# Patient Record
Sex: Male | Born: 1950 | Race: White | Hispanic: No | Marital: Single | Smoking: Current some day smoker
Health system: Southern US, Community
[De-identification: ages and names within clinical notes are randomized; demographics above are authoritative.]

## PROBLEM LIST (undated history)

## (undated) DIAGNOSIS — T4145XA Adverse effect of unspecified anesthetic, initial encounter: Secondary | ICD-10-CM

## (undated) DIAGNOSIS — N401 Enlarged prostate with lower urinary tract symptoms: Secondary | ICD-10-CM

## (undated) DIAGNOSIS — K219 Gastro-esophageal reflux disease without esophagitis: Secondary | ICD-10-CM

## (undated) DIAGNOSIS — J301 Allergic rhinitis due to pollen: Secondary | ICD-10-CM

## (undated) DIAGNOSIS — J209 Acute bronchitis, unspecified: Secondary | ICD-10-CM

## (undated) DIAGNOSIS — M79609 Pain in unspecified limb: Secondary | ICD-10-CM

## (undated) DIAGNOSIS — R634 Abnormal weight loss: Secondary | ICD-10-CM

## (undated) DIAGNOSIS — N138 Other obstructive and reflux uropathy: Secondary | ICD-10-CM

## (undated) DIAGNOSIS — I319 Disease of pericardium, unspecified: Secondary | ICD-10-CM

## (undated) DIAGNOSIS — M51379 Other intervertebral disc degeneration, lumbosacral region without mention of lumbar back pain or lower extremity pain: Secondary | ICD-10-CM

## (undated) DIAGNOSIS — E669 Obesity, unspecified: Secondary | ICD-10-CM

## (undated) DIAGNOSIS — G709 Myoneural disorder, unspecified: Secondary | ICD-10-CM

## (undated) DIAGNOSIS — L089 Local infection of the skin and subcutaneous tissue, unspecified: Secondary | ICD-10-CM

## (undated) DIAGNOSIS — D126 Benign neoplasm of colon, unspecified: Secondary | ICD-10-CM

## (undated) DIAGNOSIS — M4800 Spinal stenosis, site unspecified: Secondary | ICD-10-CM

## (undated) DIAGNOSIS — F17200 Nicotine dependence, unspecified, uncomplicated: Secondary | ICD-10-CM

## (undated) DIAGNOSIS — K5730 Diverticulosis of large intestine without perforation or abscess without bleeding: Secondary | ICD-10-CM

## (undated) DIAGNOSIS — G479 Sleep disorder, unspecified: Secondary | ICD-10-CM

## (undated) DIAGNOSIS — T8859XA Other complications of anesthesia, initial encounter: Secondary | ICD-10-CM

## (undated) DIAGNOSIS — R059 Cough, unspecified: Secondary | ICD-10-CM

## (undated) DIAGNOSIS — G2581 Restless legs syndrome: Secondary | ICD-10-CM

## (undated) DIAGNOSIS — I10 Essential (primary) hypertension: Secondary | ICD-10-CM

## (undated) DIAGNOSIS — I868 Varicose veins of other specified sites: Secondary | ICD-10-CM

## (undated) DIAGNOSIS — M5137 Other intervertebral disc degeneration, lumbosacral region: Secondary | ICD-10-CM

## (undated) DIAGNOSIS — T25229A Burn of second degree of unspecified foot, initial encounter: Secondary | ICD-10-CM

## (undated) DIAGNOSIS — R609 Edema, unspecified: Secondary | ICD-10-CM

## (undated) DIAGNOSIS — R05 Cough: Secondary | ICD-10-CM

## (undated) DIAGNOSIS — K5903 Drug induced constipation: Secondary | ICD-10-CM

## (undated) DIAGNOSIS — E119 Type 2 diabetes mellitus without complications: Secondary | ICD-10-CM

## (undated) DIAGNOSIS — R259 Unspecified abnormal involuntary movements: Secondary | ICD-10-CM

## (undated) HISTORY — DX: Edema, unspecified: R60.9

## (undated) HISTORY — DX: Restless legs syndrome: G25.81

## (undated) HISTORY — DX: Diverticulosis of large intestine without perforation or abscess without bleeding: K57.30

## (undated) HISTORY — DX: Obesity, unspecified: E66.9

## (undated) HISTORY — DX: Cough: R05

## (undated) HISTORY — DX: Pain in unspecified limb: M79.609

## (undated) HISTORY — DX: Local infection of the skin and subcutaneous tissue, unspecified: L08.9

## (undated) HISTORY — DX: Other obstructive and reflux uropathy: N13.8

## (undated) HISTORY — DX: Allergic rhinitis due to pollen: J30.1

## (undated) HISTORY — DX: Unspecified abnormal involuntary movements: R25.9

## (undated) HISTORY — PX: BACK SURGERY: SHX140

## (undated) HISTORY — DX: Sleep disorder, unspecified: G47.9

## (undated) HISTORY — DX: Acute bronchitis, unspecified: J20.9

## (undated) HISTORY — DX: Other intervertebral disc degeneration, lumbosacral region: M51.37

## (undated) HISTORY — DX: Essential (primary) hypertension: I10

## (undated) HISTORY — DX: Other intervertebral disc degeneration, lumbosacral region without mention of lumbar back pain or lower extremity pain: M51.379

## (undated) HISTORY — DX: Nicotine dependence, unspecified, uncomplicated: F17.200

## (undated) HISTORY — DX: Disease of pericardium, unspecified: I31.9

## (undated) HISTORY — DX: Abnormal weight loss: R63.4

## (undated) HISTORY — DX: Burn of second degree of unspecified foot, initial encounter: T25.229A

## (undated) HISTORY — DX: Gastro-esophageal reflux disease without esophagitis: K21.9

## (undated) HISTORY — DX: Type 2 diabetes mellitus without complications: E11.9

## (undated) HISTORY — DX: Spinal stenosis, site unspecified: M48.00

## (undated) HISTORY — PX: COLONOSCOPY W/ POLYPECTOMY: SHX1380

## (undated) HISTORY — DX: Varicose veins of other specified sites: I86.8

## (undated) HISTORY — PX: CATARACT EXTRACTION: SUR2

## (undated) HISTORY — DX: Benign neoplasm of colon, unspecified: D12.6

## (undated) HISTORY — PX: TONSILLECTOMY: SUR1361

## (undated) HISTORY — DX: Cough, unspecified: R05.9

## (undated) HISTORY — PX: SPINE SURGERY: SHX786

## (undated) HISTORY — DX: Benign prostatic hyperplasia with lower urinary tract symptoms: N40.1

---

## 1989-09-24 HISTORY — PX: SAPHENOUS VEIN GRAFT RESECTION: SHX2374

## 2005-07-31 ENCOUNTER — Encounter: Admission: RE | Admit: 2005-07-31 | Discharge: 2005-07-31 | Payer: Self-pay | Admitting: Internal Medicine

## 2005-08-19 ENCOUNTER — Emergency Department (HOSPITAL_COMMUNITY): Admission: EM | Admit: 2005-08-19 | Discharge: 2005-08-19 | Payer: Self-pay | Admitting: Emergency Medicine

## 2005-09-14 ENCOUNTER — Encounter: Admission: RE | Admit: 2005-09-14 | Discharge: 2005-09-14 | Payer: Self-pay | Admitting: Neurology

## 2005-10-05 ENCOUNTER — Inpatient Hospital Stay (HOSPITAL_COMMUNITY): Admission: RE | Admit: 2005-10-05 | Discharge: 2005-10-06 | Payer: Self-pay | Admitting: Psychiatry

## 2005-10-06 ENCOUNTER — Ambulatory Visit: Payer: Self-pay | Admitting: Psychiatry

## 2005-10-08 ENCOUNTER — Other Ambulatory Visit (HOSPITAL_COMMUNITY): Admission: RE | Admit: 2005-10-08 | Discharge: 2005-10-19 | Payer: Self-pay | Admitting: Psychiatry

## 2005-10-14 ENCOUNTER — Emergency Department (HOSPITAL_COMMUNITY): Admission: EM | Admit: 2005-10-14 | Discharge: 2005-10-14 | Payer: Self-pay | Admitting: Emergency Medicine

## 2005-10-15 ENCOUNTER — Emergency Department (HOSPITAL_COMMUNITY): Admission: EM | Admit: 2005-10-15 | Discharge: 2005-10-16 | Payer: Self-pay | Admitting: Emergency Medicine

## 2005-10-20 ENCOUNTER — Encounter: Admission: RE | Admit: 2005-10-20 | Discharge: 2005-10-20 | Payer: Self-pay | Admitting: Neurology

## 2006-09-01 ENCOUNTER — Encounter: Admission: RE | Admit: 2006-09-01 | Discharge: 2006-09-01 | Payer: Self-pay | Admitting: Neurology

## 2006-10-04 ENCOUNTER — Encounter: Admission: RE | Admit: 2006-10-04 | Discharge: 2006-10-04 | Payer: Self-pay | Admitting: Neurology

## 2006-10-28 ENCOUNTER — Encounter (HOSPITAL_COMMUNITY): Admission: RE | Admit: 2006-10-28 | Discharge: 2007-01-26 | Payer: Self-pay | Admitting: Neurology

## 2007-07-21 ENCOUNTER — Encounter: Admission: RE | Admit: 2007-07-21 | Discharge: 2007-07-21 | Payer: Self-pay | Admitting: Internal Medicine

## 2007-10-13 ENCOUNTER — Encounter: Admission: RE | Admit: 2007-10-13 | Discharge: 2007-10-13 | Payer: Self-pay | Admitting: Neurology

## 2009-03-05 ENCOUNTER — Encounter: Admission: RE | Admit: 2009-03-05 | Discharge: 2009-03-05 | Payer: Self-pay | Admitting: Allergy

## 2010-05-24 ENCOUNTER — Observation Stay (HOSPITAL_COMMUNITY): Admission: EM | Admit: 2010-05-24 | Discharge: 2010-05-25 | Payer: Self-pay | Admitting: Emergency Medicine

## 2010-07-26 ENCOUNTER — Ambulatory Visit (HOSPITAL_COMMUNITY): Payer: Self-pay | Admitting: Psychiatry

## 2010-08-31 ENCOUNTER — Emergency Department (HOSPITAL_COMMUNITY): Admission: EM | Admit: 2010-08-31 | Discharge: 2010-08-09 | Payer: Self-pay | Admitting: Emergency Medicine

## 2010-09-01 ENCOUNTER — Ambulatory Visit (HOSPITAL_COMMUNITY): Payer: Self-pay | Admitting: Psychiatry

## 2010-09-27 ENCOUNTER — Inpatient Hospital Stay: Admission: RE | Admit: 2010-09-27 | Payer: Self-pay | Source: Home / Self Care | Admitting: Neurological Surgery

## 2010-10-14 ENCOUNTER — Encounter: Payer: Self-pay | Admitting: Internal Medicine

## 2010-10-15 ENCOUNTER — Encounter: Payer: Self-pay | Admitting: Internal Medicine

## 2010-10-15 ENCOUNTER — Encounter: Payer: Self-pay | Admitting: Neurology

## 2010-11-10 ENCOUNTER — Encounter (HOSPITAL_COMMUNITY): Payer: Self-pay | Admitting: Physician Assistant

## 2010-11-15 ENCOUNTER — Encounter (HOSPITAL_COMMUNITY): Payer: Self-pay | Admitting: Physician Assistant

## 2010-11-23 ENCOUNTER — Encounter (HOSPITAL_COMMUNITY)
Admission: RE | Admit: 2010-11-23 | Discharge: 2010-11-23 | Disposition: A | Discharge status: Home / Self Care | Payer: BC Managed Care – PPO | Source: Ambulatory Visit | Attending: Neurological Surgery | Admitting: Neurological Surgery

## 2010-11-23 DIAGNOSIS — Z01810 Encounter for preprocedural cardiovascular examination: Secondary | ICD-10-CM | POA: Insufficient documentation

## 2010-11-23 LAB — CBC
HCT: 42.4 % (ref 39.0–52.0)
Hemoglobin: 14.5 g/dL (ref 13.0–17.0)
MCH: 31 pg (ref 26.0–34.0)
MCHC: 34.2 g/dL (ref 30.0–36.0)
MCV: 90.8 fL (ref 78.0–100.0)
Platelets: 231 10*3/uL (ref 150–400)
RBC: 4.67 MIL/uL (ref 4.22–5.81)
WBC: 8 10*3/uL (ref 4.0–10.5)

## 2010-11-23 LAB — SURGICAL PCR SCREEN
MRSA, PCR: NEGATIVE
Staphylococcus aureus: NEGATIVE

## 2010-11-23 LAB — DIFFERENTIAL
Basophils Absolute: 0 10*3/uL (ref 0.0–0.1)
Eosinophils Absolute: 0.4 10*3/uL (ref 0.0–0.7)
Eosinophils Relative: 5 % (ref 0–5)
Lymphocytes Relative: 27 % (ref 12–46)
Lymphs Abs: 2.2 10*3/uL (ref 0.7–4.0)
Monocytes Absolute: 1 10*3/uL (ref 0.1–1.0)
Monocytes Relative: 12 % (ref 3–12)
Neutro Abs: 4.4 10*3/uL (ref 1.7–7.7)
Neutrophils Relative %: 55 % (ref 43–77)

## 2010-11-23 LAB — BASIC METABOLIC PANEL
BUN: 15 mg/dL (ref 6–23)
CO2: 27 mEq/L (ref 19–32)
Calcium: 8.6 mg/dL (ref 8.4–10.5)
Chloride: 95 mEq/L — ABNORMAL LOW (ref 96–112)
Creatinine, Ser: 1.12 mg/dL (ref 0.4–1.5)
GFR calc Af Amer: >60 mL/min (ref >60)
GFR calc non Af Amer: >60 mL/min (ref >60)
Glucose, Bld: 210 mg/dL — ABNORMAL HIGH (ref 70–99)
Sodium: 133 mEq/L — ABNORMAL LOW (ref 135–145)

## 2010-11-23 LAB — ABO/RH: ABO/RH(D): A POS

## 2010-11-23 LAB — APTT: aPTT: 30 seconds (ref 24–37)

## 2010-11-23 LAB — PROTIME-INR: INR: 1.1 (ref 0.00–1.49)

## 2010-11-29 ENCOUNTER — Inpatient Hospital Stay (HOSPITAL_COMMUNITY): Payer: BC Managed Care – PPO

## 2010-11-29 ENCOUNTER — Inpatient Hospital Stay (HOSPITAL_COMMUNITY)
Admission: RE | Admit: 2010-11-29 | Discharge: 2010-12-01 | DRG: 807 | Disposition: A | Discharge status: Home / Self Care | Payer: BC Managed Care – PPO | Source: Ambulatory Visit | Attending: Neurological Surgery | Admitting: Neurological Surgery

## 2010-11-29 DIAGNOSIS — E119 Type 2 diabetes mellitus without complications: Secondary | ICD-10-CM | POA: Diagnosis present

## 2010-11-29 DIAGNOSIS — Q762 Congenital spondylolisthesis: Principal | ICD-10-CM

## 2010-11-29 DIAGNOSIS — F17200 Nicotine dependence, unspecified, uncomplicated: Secondary | ICD-10-CM | POA: Diagnosis present

## 2010-11-29 DIAGNOSIS — Z79899 Other long term (current) drug therapy: Secondary | ICD-10-CM

## 2010-11-29 DIAGNOSIS — I10 Essential (primary) hypertension: Secondary | ICD-10-CM | POA: Diagnosis present

## 2010-11-29 DIAGNOSIS — R339 Retention of urine, unspecified: Secondary | ICD-10-CM | POA: Diagnosis not present

## 2010-11-29 DIAGNOSIS — F341 Dysthymic disorder: Secondary | ICD-10-CM | POA: Diagnosis present

## 2010-11-29 DIAGNOSIS — M48061 Spinal stenosis, lumbar region without neurogenic claudication: Secondary | ICD-10-CM | POA: Diagnosis present

## 2010-11-29 LAB — TYPE AND SCREEN
ABO/RH(D): A POS
Antibody Screen: NEGATIVE

## 2010-11-29 LAB — GLUCOSE, CAPILLARY
Glucose-Capillary: 160 mg/dL — ABNORMAL HIGH (ref 70–99)
Glucose-Capillary: 215 mg/dL — ABNORMAL HIGH (ref 70–99)
Glucose-Capillary: 230 mg/dL — ABNORMAL HIGH (ref 70–99)

## 2010-11-30 LAB — GLUCOSE, CAPILLARY
Glucose-Capillary: 171 mg/dL — ABNORMAL HIGH (ref 70–99)
Glucose-Capillary: 162 mg/dL — ABNORMAL HIGH (ref 70–99)

## 2010-12-01 LAB — GLUCOSE, CAPILLARY
Glucose-Capillary: 171 mg/dL — ABNORMAL HIGH (ref 70–99)
Glucose-Capillary: 173 mg/dL — ABNORMAL HIGH (ref 70–99)

## 2010-12-01 NOTE — Op Note (Signed)
NAME:  Douglas Price, Douglas Price NO.:  [id number]  MEDICAL RECORD NO.:  [id number]           PATIENT TYPE:  I  LOCATION:  3041                         FACILITY:  [facility]  PHYSICIAN:  Tia Alert, MD     DATE OF BIRTH:  [birth year]  DATE OF PROCEDURE: DATE OF DISCHARGE:                              OPERATIVE REPORT   PREOPERATIVE DIAGNOSES: 1. Lumbar spinal stenosis at L3-4, L4-5. 2. Anterolisthesis of L4 on L5. 3. Back pain. 4. Leg pain.  POSTOPERATIVE DIAGNOSES: 1. Lumbar spinal stenosis at L3-4, L4-5. 2. Anterolisthesis of L4 on L5. 3. Back pain. 4. Leg pain.  PROCEDURES: 1. Anterolateral retroperitoneal interbody fusion at L3-4, L4-5     utilizing 12-mm PEEK interbody cages, packed with Osteocel Plus and     Actifuse putty. 2. Lateral instrumentation at L3-4. 3. Posterolateral fusion L3-4, L4-5 utilizing local autograft Osteocel     Plus and Actifuse putty. 4. Decompressive lumbar hemilaminectomy, medial facetectomy,     foraminotomy bilaterally at L3-4, L4-5 central canal nerve root     decompression 5. Segmental fixation L3-L5 utilizing affix plates.  SURGEON:  Tia Alert, MD  ASSISTANT:  Donalee Citrin, MD  ANESTHESIA:  General endotracheal.  COMPLICATIONS:  None apparent.  INDICATION FOR THE PROCEDURE:  Mr. Graffam is a 60 year old gentleman who presented with back pain with bilateral leg pain with numbness and tingling in the legs with feeling of weakness in the legs.  He had an MRI which showed a grade 1 anterolisthesis of L4 and L5 with spinal stenosis of L3-4 and L4-5, recommended two-level decompression and instrumented fusion.  He understood the risks, benefits and expected outcome and wished to proceed.  DESCRIPTION OF THE PROCEDURE:  The patient was taken to the operating room and after induction of adequate general endotracheal anesthesia, he was rolled into the left lateral decubitus position in the typical excellent fashion to expose  his right lateral side.  We positioned him utilizing fluoroscopy and then taped him into position.  He was hooked to EMG monitoring which was used throughout the lateral part of the case.  His skin was shaved and then cleaned with Hibiclens and then prepped with DuraPrep and then draped in usual sterile fashion.  I injected local anesthesia and then utilizing lateral fluoroscopy we marked our lateral incision and made our lateral incision, then made an incision just posterolateral to this using blunt finger dissection into the retroperitoneal space.  I felt the transverse process and the psoas musculature and I swept my finger to the lateral incision and passed my dilator down to the psoas musculature.  I then checked our twitch test. I used lateral fluoroscopy to confirm the position of my first dilator. Checked EMG monitoring, then placed my K-wire.  I then used sequential dilation until my final retractor was in place.  We then were able to open the retractor slightly and checked with a ball probe to assure no neural structures were in the working channel.  Then retractor was locked into position and the shim was placed into the disk.  The disk was incised and the diskectomy was  done with pituitary rongeurs and then used Cobb curettes to release the disk from the endplates to release the opposite annulus.  I then used scrapers and shavers and pituitary rongeurs to perform a lateral diskectomy.  The opposite annulus was opened with the Cobb curette.  Once the diskectomies complete, we passed the 16 paddle across the midline, opened the opposite annulus further, checked with AP and lateral fluoroscopy, then used our sequential trials until the 12-mm lordotic trial fit the best.  We used a 12 x 18 x  55 mm PEEK interbody cage, packed with Osteocel Plus and Actifuse putty and tapped this into position utilizing AP fluoroscopy at L4-5.  I then irrigated with saline solution containing  bacitracin, dried all bleeding points and removed the retractor.  We checked our final construct with AP and lateral fluoroscopy and then moved to L3-4, marked my lateral incision and used the same posterolateral hole to pass the first dilator from the lateral incision down to the psoas musculature.  We then stimulated once again and then placed our K-wire and then used sequential dilators until our final retractor was in place.  We then used the ball probe to assure no neural structures within the working channel, placed our shim into the disk, incised the disk and performed the exact same diskectomy utilizing Cobb curettes to release the opposite annulus, to release the disk from the endplates. We used scrapers and shavers.  Once our diskectomy was completed, the endplates were prepared.  I used sequential trials once again.  Again the 12-mm lordotic trial fit the best.  At this level, we used a 12 mm x 55 mg x 22 mm PEEK interbody cage, packed with Osteocel Plus and Actifuse putty and tapped this into position and at this level we were able to use lateral instrumentation.  We were able to use an awl and then the 55 mm drill to prepare the area and then used 60 mm screws through the graft, I tapped into position.  We then irrigated with saline solution containing bacitracin, dried all bleeding points and checked final construct.  We removed the retractor and then closed the incisions with 0 Vicryl in the fascia, 2-0 Vicryl in the subcutaneous tissues, 3-0 Vicryl in the subcuticular tissues and the skin was closed with Dermabond.  The patient was later rolled into prone position on chest rolls and all pressure points were padded.  His lumbar region was clamped with Hibiclens and then draped with DuraPrep and then draped in usual sterile fashion.  A dorsal midline incision was made and the dissection was taken down to the fascia.  The fascia was opened.  The paraspinous musculature was  taken out in subperiosteal fashion to expose L3-4 and L4-5.  Intraoperative fluoroscopy confirmed my level and then I used the high-speed drill and Kerrison punches to perform hemilaminectomies, medial facetectomies and foraminotomies at L3-4 and L4-5 on the left, followed by sublaminar decompression.  I was able remove the interspinous ligament at L3-4 and L4-5, and therefore marched across the midline and did medial facetectomies on the right-hand side, undercut the lateral facets, identified the lateral edge of the dura and the opposite pedicle.  I decompressed each side undercutting the lateral recess, identifying the exiting nerve roots to make sure that they were well decompressed.  The central canal appeared to be well decompressed at both levels.  A coronary dilator was passed easily along each of the 4 nerve roots.  I then irrigated with saline  solution.  I lined the dura with Gelfoam and turned my attention to the posterior fusion and posterior fixation.  I was able to drill the lateral facets and lamina complex on the right-hand side and placed a mixture of local autograft, Actifuse putty and Osteocel Plus out over this to perform the posterolateral arthrodesis and then placed 2 medium affix plates and checked these with AP and lateral fluoroscopy.  These appeared to be well positioned and therefore I placed a medium Hemovac drain through a separate stab incision.  I then closed the fascia with 0 Vicryl, closed the subcutaneous and subcuticular tissue with 2-0 and 3-0 Vicryl and closed the skin with Benzoin and Steri-Strips.  The drapes were removed.  Sterile dressing was applied.  The patient was awakened from general anesthesia and transferred to the recovery room in stable condition.  At the end of the procedure, all sponge, needle, and instrument counts were correct.     Tia Alert, MD     DSJ/MEDQ  D:  11/29/2010  T:  11/30/2010  Job:  [id number]  Electronically  Signed by Marikay Alar MD on 12/01/2010 08:11:24 AM

## 2010-12-05 LAB — DIFFERENTIAL
Basophils Absolute: 0 10*3/uL (ref 0.0–0.1)
Basophils Relative: 0 % (ref 0–1)
Eosinophils Relative: 1 % (ref 0–5)
Monocytes Absolute: 1.4 10*3/uL — ABNORMAL HIGH (ref 0.1–1.0)
Neutro Abs: 13.8 10*3/uL — ABNORMAL HIGH (ref 1.7–7.7)

## 2010-12-05 LAB — CBC
Hemoglobin: 15.4 g/dL (ref 13.0–17.0)
MCV: 92.8 fL (ref 78.0–100.0)
Platelets: 267 10*3/uL (ref 150–400)
RBC: 4.67 MIL/uL (ref 4.22–5.81)
WBC: 16.7 10*3/uL — ABNORMAL HIGH (ref 4.0–10.5)

## 2010-12-05 LAB — URINE CULTURE: Colony Count: NO GROWTH

## 2010-12-05 LAB — COMPREHENSIVE METABOLIC PANEL
AST: 45 U/L — ABNORMAL HIGH (ref 0–37)
Albumin: 3.7 g/dL (ref 3.5–5.2)
Alkaline Phosphatase: 44 U/L (ref 39–117)
BUN: 15 mg/dL (ref 6–23)
CO2: 27 mEq/L (ref 19–32)
Chloride: 97 mEq/L (ref 96–112)
GFR calc non Af Amer: >60 mL/min (ref >60)
Potassium: 3.7 mEq/L (ref 3.5–5.1)
Total Bilirubin: 0.9 mg/dL (ref 0.3–1.2)

## 2010-12-05 LAB — URINALYSIS, ROUTINE W REFLEX MICROSCOPIC
Ketones, ur: NEGATIVE mg/dL
Nitrite: NEGATIVE
Protein, ur: NEGATIVE mg/dL
Urobilinogen, UA: 1 mg/dL (ref 0.0–1.0)

## 2010-12-07 LAB — BASIC METABOLIC PANEL
BUN: 12 mg/dL (ref 6–23)
Calcium: 8.3 mg/dL — ABNORMAL LOW (ref 8.4–10.5)
GFR calc non Af Amer: >60 mL/min (ref >60)
Glucose, Bld: 104 mg/dL — ABNORMAL HIGH (ref 70–99)

## 2010-12-07 LAB — CBC
MCHC: 33.7 g/dL (ref 30.0–36.0)
Platelets: 265 10*3/uL (ref 150–400)
RDW: 13.4 % (ref 11.5–15.5)

## 2010-12-08 LAB — CBC
HCT: 45.8 % (ref 39.0–52.0)
HCT: 48.2 % (ref 39.0–52.0)
Hemoglobin: 15.7 g/dL (ref 13.0–17.0)
Hemoglobin: 16.6 g/dL (ref 13.0–17.0)
MCHC: 34.3 g/dL (ref 30.0–36.0)
MCHC: 34.4 g/dL (ref 30.0–36.0)
RBC: 4.96 MIL/uL (ref 4.22–5.81)

## 2010-12-08 LAB — BASIC METABOLIC PANEL
CO2: 31 mEq/L (ref 19–32)
GFR calc non Af Amer: >60 mL/min (ref >60)
Glucose, Bld: 102 mg/dL — ABNORMAL HIGH (ref 70–99)
Potassium: 3.7 mEq/L (ref 3.5–5.1)
Sodium: 139 mEq/L (ref 135–145)

## 2010-12-08 LAB — URINALYSIS, ROUTINE W REFLEX MICROSCOPIC
Protein, ur: NEGATIVE mg/dL
Urobilinogen, UA: 1 mg/dL (ref 0.0–1.0)

## 2010-12-08 LAB — CULTURE, BLOOD (ROUTINE X 2)

## 2010-12-08 LAB — HEPATIC FUNCTION PANEL
Bilirubin, Direct: 0.2 mg/dL (ref 0.0–0.3)
Indirect Bilirubin: 1 mg/dL — ABNORMAL HIGH (ref 0.3–0.9)
Total Bilirubin: 1.2 mg/dL (ref 0.3–1.2)

## 2010-12-08 LAB — DIFFERENTIAL
Eosinophils Absolute: 0.3 10*3/uL (ref 0.0–0.7)
Lymphocytes Relative: 12 % (ref 12–46)
Lymphs Abs: 2.3 10*3/uL (ref 0.7–4.0)
Neutrophils Relative %: 76 % (ref 43–77)

## 2010-12-08 LAB — VITAMIN B12: Vitamin B-12: 604 pg/mL (ref 211–911)

## 2010-12-08 LAB — SEDIMENTATION RATE: Sed Rate: 4 mm/hr (ref 0–16)

## 2010-12-08 LAB — TSH: TSH: 0.712 u[IU]/mL (ref 0.350–4.500)

## 2010-12-08 LAB — CK: Total CK: 154 U/L (ref 7–232)

## 2010-12-08 LAB — T4, FREE: Free T4: 0.92 ng/dL (ref 0.80–1.80)

## 2010-12-08 LAB — URINE CULTURE

## 2010-12-08 LAB — HIV ANTIBODY (ROUTINE TESTING W REFLEX): HIV: NONREACTIVE

## 2010-12-15 NOTE — Discharge Summary (Signed)
  NAME:  Douglas Price, Douglas Price NO.:  [id number]  MEDICAL RECORD NO.:  [id number]           PATIENT TYPE:  I  LOCATION:  3041                         FACILITY:  [facility]  PHYSICIAN:  Tia Alert, MD     DATE OF BIRTH:  [birth year]  DATE OF ADMISSION:  11/29/2010 DATE OF DISCHARGE:  12/01/2010                              DISCHARGE SUMMARY   ADMISSION DIAGNOSIS:  Lumbar spinal stenosis with spondylolisthesis.  PROCEDURES:  Lumbar decompression and instrumented fusion.  HISTORY OF PRESENT ILLNESS:  [name] is a 60 year old gentleman who presented with pain and numbness and tingling with feeling of weakness in his legs.  He had MRI which showed spondylolisthesis at L4-5 with severe spinal stenosis at L3-4 and L4-5.  I recommended decompression and instrumented fusion at those levels.  He understood the risks, benefits, expected outcome, and wished to proceed.  HOSPITAL COURSE:  The patient was admitted on November 29, 2010, taken to the operating room where he underwent an anterolateral retroperitoneal interbody fusion at L3-4 and L4-5 followed by posterior decompression and instrumented fusion.  The patient tolerated the procedure well and was taken to the recovery room and then into the floor in stable condition.  For details of the operative procedure, please see the dictated operative note.  The patient's hospital course was routine. There were no significant complications.  He did have some mild urinary retention requiring one dose of Flomax and then had excellent urinary output after that.  He had minimal pain following the surgery.  He had appropriate right hip flexor soreness and mild weakness from the surgical approach but had significant improvement in his lower extremity, feelings of numbness, tingling, and weakness.  He had no lower extremity pain.  Incisions remained clean, dry, and intact.  He tolerated a regular diet.  His pain was well controlled on oral  pain medications.  He was discharged home in stable condition on December 01, 2010, with plans to follow up in 2 weeks.  FINAL DIAGNOSIS:  Lateral lumbar interbody fusion L3-4 and L4-5.     Tia Alert, MD     DSJ/MEDQ  D:  12/01/2010  T:  12/01/2010  Job:  [id number]  Electronically Signed by Marikay Alar MD on 12/15/2010 11:57:25 AM

## 2010-12-25 ENCOUNTER — Other Ambulatory Visit: Payer: Self-pay | Admitting: Neurological Surgery

## 2010-12-25 ENCOUNTER — Ambulatory Visit
Admission: RE | Admit: 2010-12-25 | Discharge: 2010-12-25 | Disposition: A | Discharge status: Home / Self Care | Payer: BC Managed Care – PPO | Source: Ambulatory Visit | Attending: Neurological Surgery | Admitting: Neurological Surgery

## 2010-12-25 DIAGNOSIS — M545 Low back pain: Secondary | ICD-10-CM

## 2011-01-01 ENCOUNTER — Inpatient Hospital Stay (HOSPITAL_COMMUNITY)
Admission: EM | Admit: 2011-01-01 | Discharge: 2011-01-08 | DRG: 755 | Disposition: A | Discharge status: Home / Self Care | Payer: BC Managed Care – PPO | Attending: Neurological Surgery | Admitting: Neurological Surgery

## 2011-01-01 ENCOUNTER — Observation Stay (HOSPITAL_COMMUNITY): Payer: BC Managed Care – PPO

## 2011-01-01 DIAGNOSIS — F329 Major depressive disorder, single episode, unspecified: Secondary | ICD-10-CM | POA: Diagnosis present

## 2011-01-01 DIAGNOSIS — E860 Dehydration: Secondary | ICD-10-CM | POA: Diagnosis present

## 2011-01-01 DIAGNOSIS — F17200 Nicotine dependence, unspecified, uncomplicated: Secondary | ICD-10-CM | POA: Diagnosis present

## 2011-01-01 DIAGNOSIS — F3289 Other specified depressive episodes: Secondary | ICD-10-CM | POA: Diagnosis present

## 2011-01-01 DIAGNOSIS — I10 Essential (primary) hypertension: Secondary | ICD-10-CM | POA: Diagnosis present

## 2011-01-01 DIAGNOSIS — G8929 Other chronic pain: Secondary | ICD-10-CM | POA: Diagnosis present

## 2011-01-01 DIAGNOSIS — Z981 Arthrodesis status: Secondary | ICD-10-CM

## 2011-01-01 DIAGNOSIS — T84498A Other mechanical complication of other internal orthopedic devices, implants and grafts, initial encounter: Principal | ICD-10-CM | POA: Diagnosis present

## 2011-01-01 DIAGNOSIS — Z79899 Other long term (current) drug therapy: Secondary | ICD-10-CM

## 2011-01-01 DIAGNOSIS — Y831 Surgical operation with implant of artificial internal device as the cause of abnormal reaction of the patient, or of later complication, without mention of misadventure at the time of the procedure: Secondary | ICD-10-CM | POA: Diagnosis present

## 2011-01-01 DIAGNOSIS — K219 Gastro-esophageal reflux disease without esophagitis: Secondary | ICD-10-CM | POA: Diagnosis present

## 2011-01-01 LAB — BASIC METABOLIC PANEL
BUN: 20 mg/dL (ref 6–23)
BUN: 18 mg/dL (ref 6–23)
CO2: 27 mEq/L (ref 19–32)
Chloride: 99 mEq/L (ref 96–112)
Chloride: 100 mEq/L (ref 96–112)
Creatinine, Ser: 1 mg/dL (ref 0.4–1.5)
GFR calc Af Amer: >60 mL/min (ref >60)
GFR calc non Af Amer: >60 mL/min (ref >60)
GFR calc non Af Amer: >60 mL/min (ref >60)
Glucose, Bld: 151 mg/dL — ABNORMAL HIGH (ref 70–99)
Glucose, Bld: 122 mg/dL — ABNORMAL HIGH (ref 70–99)
Potassium: 3.9 mEq/L (ref 3.5–5.1)
Sodium: 131 mEq/L — ABNORMAL LOW (ref 135–145)

## 2011-01-01 LAB — DIFFERENTIAL
Basophils Absolute: 0 10*3/uL (ref 0.0–0.1)
Basophils Relative: 1 % (ref 0–1)
Eosinophils Absolute: 0.6 10*3/uL (ref 0.0–0.7)
Neutro Abs: 4.5 10*3/uL (ref 1.7–7.7)
Neutrophils Relative %: 52 % (ref 43–77)

## 2011-01-01 LAB — CBC
HCT: 36.1 % — ABNORMAL LOW (ref 39.0–52.0)
Hemoglobin: 12.1 g/dL — ABNORMAL LOW (ref 13.0–17.0)
MCHC: 33.5 g/dL (ref 30.0–36.0)
Platelets: 252 10*3/uL (ref 150–400)
RBC: 3.96 MIL/uL — ABNORMAL LOW (ref 4.22–5.81)
RDW: 13 % (ref 11.5–15.5)

## 2011-01-01 LAB — PROTIME-INR: INR: 1.08 (ref 0.00–1.49)

## 2011-01-01 MED ORDER — GADOBENATE DIMEGLUMINE 529 MG/ML IV SOLN
20.0000 mL | Freq: Once | INTRAVENOUS | Status: AC | PRN
  Administered 2011-01-01: 20 mL via INTRAVENOUS

## 2011-01-02 LAB — GLUCOSE, CAPILLARY
Glucose-Capillary: 124 mg/dL — ABNORMAL HIGH (ref 70–99)
Glucose-Capillary: 132 mg/dL — ABNORMAL HIGH (ref 70–99)

## 2011-01-03 LAB — GLUCOSE, CAPILLARY: Glucose-Capillary: 115 mg/dL — ABNORMAL HIGH (ref 70–99)

## 2011-01-04 ENCOUNTER — Observation Stay (HOSPITAL_COMMUNITY): Payer: BC Managed Care – PPO

## 2011-01-04 ENCOUNTER — Inpatient Hospital Stay (HOSPITAL_COMMUNITY)
Admission: RE | Admit: 2011-01-04 | Payer: BC Managed Care – PPO | Source: Ambulatory Visit | Admitting: Neurological Surgery

## 2011-01-04 LAB — GLUCOSE, CAPILLARY
Glucose-Capillary: 121 mg/dL — ABNORMAL HIGH (ref 70–99)
Glucose-Capillary: 112 mg/dL — ABNORMAL HIGH (ref 70–99)

## 2011-01-05 ENCOUNTER — Observation Stay (HOSPITAL_COMMUNITY): Payer: BC Managed Care – PPO

## 2011-01-05 LAB — GLUCOSE, CAPILLARY: Glucose-Capillary: 139 mg/dL — ABNORMAL HIGH (ref 70–99)

## 2011-01-06 LAB — GLUCOSE, CAPILLARY: Glucose-Capillary: 107 mg/dL — ABNORMAL HIGH (ref 70–99)

## 2011-01-07 LAB — GLUCOSE, CAPILLARY
Glucose-Capillary: 147 mg/dL — ABNORMAL HIGH (ref 70–99)
Glucose-Capillary: 125 mg/dL — ABNORMAL HIGH (ref 70–99)

## 2011-01-08 LAB — POCT I-STAT 4, (NA,K, GLUC, HGB,HCT)
Glucose, Bld: 125 mg/dL — ABNORMAL HIGH (ref 70–99)
HCT: 38 % — ABNORMAL LOW (ref 39.0–52.0)
Hemoglobin: 12.9 g/dL — ABNORMAL LOW (ref 13.0–17.0)
Potassium: 4 mEq/L (ref 3.5–5.1)

## 2011-01-08 LAB — GLUCOSE, CAPILLARY: Glucose-Capillary: 166 mg/dL — ABNORMAL HIGH (ref 70–99)

## 2011-01-08 NOTE — Op Note (Signed)
NAME:  Douglas Price, TOSO NO.:  [id number]  MEDICAL RECORD NO.:  [id number]           PATIENT TYPE:  O  LOCATION:  3032                         FACILITY:  [facility]  PHYSICIAN:  Tia Alert, MD     DATE OF BIRTH:  1951/03/06  DATE OF PROCEDURE:  01/04/2011 DATE OF DISCHARGE:                              OPERATIVE REPORT   PREOPERATIVE DIAGNOSIS:  Failed fusion L4-5 with fracture of the superior L5 end plate and expulsion of the interbody graft at L4-5 with back and right leg pain.  POSTOPERATIVE DIAGNOSIS:  Failed fusion L4-5 with fracture of the superior L5 end plate and expulsion of the interbody graft at L4-5 with back and right leg pain.  PROCEDURE: 1. Anterolateral retroperitoneal exposure for extraction of extruded     graft at L4-5 utilizing EMG monitoring. 2. Lumbar re-exploration with removal of segmental hardware L3-4, L4-     5. 3. Redo decompressive laminectomy and hemi facetectomy L4-5 for     decompression of the L4-L5 nerve roots on the right. 4. Posterolateral arthrodesis L3-4, L4-5 utilizing local autograft and     Actifuse putty. 5. Segmental fixation L3-L5 bilaterally utilizing the [employer] pedicle     screw system.  SURGEON:  Tia Alert, MD  ASSISTANT:  Reinaldo Meeker, MD  ANESTHESIA:  General endotracheal.  COMPLICATIONS:  None apparent.  INDICATIONS FOR PROCEDURE:  Mr. Riera is an unfortunate 60 year old gentleman who underwent a two-level XLIF little over a month ago with posterior plating.  He developed severe right leg pain, when upright, he had a CT scan and MRI which showed a fracture of the L5 superior endplate with expulsion of the graft laterally into the retroperitoneal space by a couple of centimeters.  I recommended lumbar re-exploration with removal of the graft followed by posterior decompression and instrumented fusion.  He understood the risks, benefits, expected outcome and wished to proceed.  DESCRIPTION  OF PROCEDURE:  The patient was taken to the operating room and after induction of adequate generalized endotracheal anesthesia, he was placed into the left lateral decubitus position exposing his right side.  He was hooked to EMG monitoring.  We used AP and lateral fluoroscopy to position him.  We were able to prep him with DuraPrep and then drape him in usual sterile fashion and then open.  I then make two incisions, one laterally over the L4-5 interspace and one just posterolateral to this, used blunt finger dissection to enter the retroperitoneal space.  We could feel the psoas muscle, the rib and the iliac crest.  We passed our finger to the lateral incision and passed our first dilator.  We passed our dilators down until they were resting on top of the extruded graft.  We placed our retractor, opened it, we were able to see the graft.  I was able to put a hook on the graft and remove it utilizing EMG monitoring surface to assure we were not stretching any neural structures within the psoas musculature.  Once the graft was removed, I irrigated with saline solution containing bacitracin, dried all bleeding points, removed the retractor, then  closed the incisions with 0 Vicryl in the fascia, 2-0 Vicryl in the subcutaneous tissues, 3-0 Vicryl in the subcuticular tissues and the skin was closed with Dermabond.  The drapes were removed.  The patient was repositioned to the prone position on chest rolls and all pressure points were padded.  His lumbar region was prepped with DuraPrep and then draped in usual sterile fashion.  His old incision was opened and I took down along the paraspinous musculature in subperiosteal fashion to expose the plates.  The plates were removed, the L4 spinous process had fractured, I was able to remove the spinous process.  I took my dissection out over the facets to expose the transverse processes of L3, L4 and L5.  I used the Leksell rongeur and the Kerrison  punch to perform a hemi facetectomy on the right at L4-5 to expose the underlying L4-L5 nerve roots, I decompressed these distally into their respective foramina.  I completed the laminectomy at L4-5 and undercut L3 once again.  Once this was done, I felt like I had adequate decompression of the L4-L5 nerve roots on the right on the central canal.  I localized my pedicle entry zones utilizing surface landmarks and AP and lateral fluoroscopy.  I probed each pedicle with pedicle probe and tapped each pedicle with a 5.0 tap, placed 6.5 x 50 mm pedicle screws into the L3 and L5 pedicles bilaterally.  The L4 pedicles received 5.5 x 45 mm pedicle screws.  I decorticated the transverse processes and placed a mixture of local autograft and Actifuse putty bilaterally from L3-L5.  I then placed lordotic rods into multiaxial screw heads and locked these into position with a locking caps and an antitorque device.  I then checked my final construct with AP and lateral fluoroscopy, it looked quite good.  I irrigated with saline solution containing bacitracin, dried all bleeding points, aligned the dura with Gelfoam, placed two medium Hemovac drains through separate stab incisions and closed the muscle and the fascia with 0 Vicryl, closed the subcutaneous and subcuticular tissue with 2-0 and 3-0 Vicryl, closed the skin with Benzoin and Steri-Strips.  The drapes were removed.  Sterile dressing was applied.  The patient was awakened from general anesthesia and transferred to the recovery room in stable condition.  At the end of the procedure, all sponge, needle, and instrument counts were correct.     Tia Alert, MD     DSJ/MEDQ  D:  01/04/2011  T:  01/05/2011  Job:  [id number]  Electronically Signed by Marikay Alar MD on 01/07/2011 12:59:17 PM

## 2011-01-16 ENCOUNTER — Ambulatory Visit
Admission: RE | Admit: 2011-01-16 | Discharge: 2011-01-16 | Disposition: A | Discharge status: Home / Self Care | Payer: BC Managed Care – PPO | Source: Ambulatory Visit | Attending: Neurological Surgery | Admitting: Neurological Surgery

## 2011-01-16 ENCOUNTER — Other Ambulatory Visit: Payer: Self-pay | Admitting: Neurological Surgery

## 2011-01-16 DIAGNOSIS — T84498A Other mechanical complication of other internal orthopedic devices, implants and grafts, initial encounter: Secondary | ICD-10-CM

## 2011-01-16 DIAGNOSIS — S32009A Unspecified fracture of unspecified lumbar vertebra, initial encounter for closed fracture: Secondary | ICD-10-CM

## 2011-01-22 NOTE — Discharge Summary (Signed)
  NAME:  Douglas Price, Douglas Price NO.:  [id number]  MEDICAL RECORD NO.:  [id number]           PATIENT TYPE:  O  LOCATION:  3032                         FACILITY:  [facility]  PHYSICIAN:  Tia Alert, MD     DATE OF BIRTH:  September 11, 1951  DATE OF ADMISSION:  01/01/2011 DATE OF DISCHARGE:                              DISCHARGE SUMMARY   ADMITTING DIAGNOSIS:  Severe back and leg pain related to failed fusion.  PROCEDURE:  Lumbar reexploration with removal of interbody graft at L4-5 followed by decompression instrumented fusion at L3-L5.  BRIEF HISTORY OF PRESENT ILLNESS:  Douglas Price is a very pleasant gentleman who underwent a 2-level XLIF about a month ago.  He had development of severe right leg pain and was found to have a fracture of his L5 superior endplate and extrusion of the graft into the psoas on the right.  I recommended a lumbar reexploration with a removal of the graft and repeat fusion with pedicle screw and rod instrumentation.  He was admitted for pain control on January 01, 2011.  He ended up going to the operating room.  A couple of days later, he underwent a retroperitoneal exploration and removal of his L4-5 interbody graft.  He then underwent a decompressive laminectomy at L4-5 and instrumented fusion from L3 to L5.  The patient tolerated the procedure well and was taken to the recovery room and then to the floor in stable condition. For details of the operative procedure, please see the dictated operative note.  The patient's hospital course was routine.  After that, there were no complications.  His incision remained clean, dry, and intact.  He had a Hemovac drain in and it was removed on the day of discharge.  His back was not overly sore from the procedure.  His right leg pain was much improved.  He worked with Physical and Occupational Therapy and did well with that.  He tolerated regular diet.  His pain was well controlled on oral pain medications and was  discharged home in stable condition on January 07, 2011, with plans to follow up in 2 weeks.  FINAL DIAGNOSIS:  Lumbar fusion, L3-L5.     Tia Alert, MD     DSJ/MEDQ  D:  01/07/2011  T:  01/07/2011  Job:  [id number]  Electronically Signed by Marikay Alar MD on 01/22/2011 10:46:10 AM

## 2011-01-25 ENCOUNTER — Ambulatory Visit
Admission: RE | Admit: 2011-01-25 | Discharge: 2011-01-25 | Disposition: A | Discharge status: Home / Self Care | Payer: BC Managed Care – PPO | Source: Ambulatory Visit | Attending: Neurological Surgery | Admitting: Neurological Surgery

## 2011-01-25 ENCOUNTER — Inpatient Hospital Stay (HOSPITAL_COMMUNITY): Admission: AD | Admit: 2011-01-25 | Payer: Self-pay | Source: Ambulatory Visit | Admitting: Neurological Surgery

## 2011-01-25 ENCOUNTER — Other Ambulatory Visit: Payer: Self-pay | Admitting: Neurological Surgery

## 2011-01-25 DIAGNOSIS — M545 Low back pain: Secondary | ICD-10-CM

## 2011-02-09 NOTE — Discharge Summary (Signed)
NAME:  Douglas Price, Douglas Price NO.:  [id number]   MEDICAL RECORD NO.:  [id number]          PATIENT TYPE:  IPS   LOCATION:  0506                          FACILITY:  [facility]   PHYSICIAN:  Jeanice Lim, M.D. DATE OF BIRTH:  August 19, 1951   DATE OF ADMISSION:  10/05/2005  DATE OF DISCHARGE:  10/06/2005                                 DISCHARGE SUMMARY   IDENTIFYING DATA:  This is a 60 year old single Caucasian male, voluntarily  admitted, feeling overwhelmed, with increased worry, multiple stressors and  medical problems, unable to work.  Was unsure what to do next.  Had lost 20  pounds.  Denied any significant suicidal ideation, just felt overwhelmed and  desperate and wanted help.  This is the patient's first [facility] admission.  The patient smokes a  pipe, 10-20 a day, and no alcohol  or drug use.  Primary care [place] is Dr. Jacky Kindle.   ADMISSION MEDICATIONS:  Micardis, Cymbalta, Xanax, and Astelin spray.   ALLERGIES:  AVELOX causing anaphylaxis.   PHYSICAL AND NEUROLOGICAL EXAMINATION:  Essentially within normal limits.   MENTAL STATUS EXAM:  Alert, cooperative, middle-aged, casually dressed,  clear speech.  Mood depressed but the patient appeared to have reality  testing intact and affect was mild anxiety and tearful but appropriate.  Thought process goal directed, judgment and insight were fair to good based  on evaluation, and cognition intact.   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  AXIS II:  Deferred.  AXIS III:  Guillain-Barre and hypertension and pericarditis history.  AXIS IV:  Moderate to severe problems with occupation, health problems and  psychosocial issues.  AXIS V:  45-60.   HOSPITAL COURSE:  The patient was admitted for monitoring and medications  were reconciled and resumed.  The patient was admitted for increased  depressive symptoms and questionable plan for self harm.  The patient denied  any active suicidal ideation or any intent or plan.  Family also agreed that  the patient was not a danger to himself.  There were no psychotic symptoms.  Mood was mildly depressed and affect appropriate and there were no psychotic  symptoms nor dangerous ideation and the patient was discharged due to lack  of clinical criteria for further inpatient treatment, with outpatient  treatment arranged.  The patient was given medication education and was  agreeable to follow up with intensive outpatient program at [facility] and given medication education and discharged on:  1.  Risperdal 0.25 mg q.h.s.  2.  Paxil CR 12.5 mg daily.  3.  Ambien 10 mg q.h.s. p.r.n.  4.  Restoril 15-30 mg q.h.s.  5.  Other medications as previously prescribed including Allegra and nasal      spray.  The patient was not to take Cymbalta and not to take [facility]      which he had been on prior to admission.   DISPOSITION:  The patient's outpatient followup with intensive outpatient  program was the day after discharge, first day clinic open.   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  AXIS II:  Deferred.  AXIS III:  Guillain-Barrett and hypertension and pericarditis history.  AXIS IV:  Moderate to severe problems with occupation, health problems and  psychosocial issues.  AXIS V:  Global assessment of functioning on discharge was 60.      Jeanice Lim, M.D.  Electronically Signed     JEM/MEDQ  D:  10/30/2005  T:  10/30/2005  Job:  [id number]

## 2011-02-12 ENCOUNTER — Ambulatory Visit
Admission: RE | Admit: 2011-02-12 | Discharge: 2011-02-12 | Disposition: A | Discharge status: Home / Self Care | Payer: BC Managed Care – PPO | Source: Ambulatory Visit | Attending: Neurological Surgery | Admitting: Neurological Surgery

## 2011-02-12 ENCOUNTER — Other Ambulatory Visit: Payer: Self-pay | Admitting: Neurological Surgery

## 2011-02-12 DIAGNOSIS — T84498A Other mechanical complication of other internal orthopedic devices, implants and grafts, initial encounter: Secondary | ICD-10-CM

## 2011-02-12 DIAGNOSIS — S32009A Unspecified fracture of unspecified lumbar vertebra, initial encounter for closed fracture: Secondary | ICD-10-CM

## 2011-03-19 ENCOUNTER — Other Ambulatory Visit: Payer: Self-pay | Admitting: Neurological Surgery

## 2011-03-19 ENCOUNTER — Ambulatory Visit
Admission: RE | Admit: 2011-03-19 | Discharge: 2011-03-19 | Disposition: A | Discharge status: Home / Self Care | Payer: BC Managed Care – PPO | Source: Ambulatory Visit | Attending: Neurological Surgery | Admitting: Neurological Surgery

## 2011-03-19 DIAGNOSIS — Z981 Arthrodesis status: Secondary | ICD-10-CM

## 2011-03-19 DIAGNOSIS — S32009A Unspecified fracture of unspecified lumbar vertebra, initial encounter for closed fracture: Secondary | ICD-10-CM

## 2011-03-19 DIAGNOSIS — T84498A Other mechanical complication of other internal orthopedic devices, implants and grafts, initial encounter: Secondary | ICD-10-CM

## 2011-03-21 ENCOUNTER — Emergency Department (HOSPITAL_COMMUNITY)
Admission: EM | Admit: 2011-03-21 | Discharge: 2011-03-21 | Disposition: A | Discharge status: Home / Self Care | Payer: BC Managed Care – PPO | Attending: Emergency Medicine | Admitting: Emergency Medicine

## 2011-03-21 DIAGNOSIS — K219 Gastro-esophageal reflux disease without esophagitis: Secondary | ICD-10-CM | POA: Insufficient documentation

## 2011-03-21 DIAGNOSIS — G8929 Other chronic pain: Secondary | ICD-10-CM | POA: Insufficient documentation

## 2011-03-21 DIAGNOSIS — F3289 Other specified depressive episodes: Secondary | ICD-10-CM | POA: Insufficient documentation

## 2011-03-21 DIAGNOSIS — E119 Type 2 diabetes mellitus without complications: Secondary | ICD-10-CM | POA: Insufficient documentation

## 2011-03-21 DIAGNOSIS — I10 Essential (primary) hypertension: Secondary | ICD-10-CM | POA: Insufficient documentation

## 2011-03-21 DIAGNOSIS — M79609 Pain in unspecified limb: Secondary | ICD-10-CM | POA: Insufficient documentation

## 2011-03-21 DIAGNOSIS — T25229A Burn of second degree of unspecified foot, initial encounter: Secondary | ICD-10-CM | POA: Insufficient documentation

## 2011-03-21 DIAGNOSIS — X118XXA Contact with other hot tap-water, initial encounter: Secondary | ICD-10-CM | POA: Insufficient documentation

## 2011-03-21 DIAGNOSIS — F329 Major depressive disorder, single episode, unspecified: Secondary | ICD-10-CM | POA: Insufficient documentation

## 2011-03-21 DIAGNOSIS — M549 Dorsalgia, unspecified: Secondary | ICD-10-CM | POA: Insufficient documentation

## 2011-04-02 ENCOUNTER — Encounter (HOSPITAL_BASED_OUTPATIENT_CLINIC_OR_DEPARTMENT_OTHER): Payer: BC Managed Care – PPO | Attending: Internal Medicine

## 2011-04-02 DIAGNOSIS — X131XXA Other contact with steam and other hot vapors, initial encounter: Secondary | ICD-10-CM | POA: Insufficient documentation

## 2011-04-02 DIAGNOSIS — R7309 Other abnormal glucose: Secondary | ICD-10-CM | POA: Insufficient documentation

## 2011-04-02 DIAGNOSIS — T25229A Burn of second degree of unspecified foot, initial encounter: Secondary | ICD-10-CM | POA: Insufficient documentation

## 2011-04-02 DIAGNOSIS — X12XXXA Contact with other hot fluids, initial encounter: Secondary | ICD-10-CM | POA: Insufficient documentation

## 2011-04-02 DIAGNOSIS — T310 Burns involving less than 10% of body surface: Secondary | ICD-10-CM | POA: Insufficient documentation

## 2011-04-02 DIAGNOSIS — G589 Mononeuropathy, unspecified: Secondary | ICD-10-CM | POA: Insufficient documentation

## 2011-04-02 DIAGNOSIS — I10 Essential (primary) hypertension: Secondary | ICD-10-CM | POA: Insufficient documentation

## 2011-04-02 DIAGNOSIS — Z79899 Other long term (current) drug therapy: Secondary | ICD-10-CM | POA: Insufficient documentation

## 2011-04-02 DIAGNOSIS — K219 Gastro-esophageal reflux disease without esophagitis: Secondary | ICD-10-CM | POA: Insufficient documentation
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
## 2011-05-07 ENCOUNTER — Other Ambulatory Visit: Payer: Self-pay | Admitting: Neurological Surgery

## 2011-05-07 ENCOUNTER — Encounter (HOSPITAL_BASED_OUTPATIENT_CLINIC_OR_DEPARTMENT_OTHER): Payer: BC Managed Care – PPO | Attending: Internal Medicine

## 2011-05-07 ENCOUNTER — Ambulatory Visit
Admission: RE | Admit: 2011-05-07 | Discharge: 2011-05-07 | Disposition: A | Discharge status: Home / Self Care | Payer: BC Managed Care – PPO | Source: Ambulatory Visit | Attending: Neurological Surgery | Admitting: Neurological Surgery

## 2011-05-07 DIAGNOSIS — I10 Essential (primary) hypertension: Secondary | ICD-10-CM | POA: Insufficient documentation

## 2011-05-07 DIAGNOSIS — X12XXXA Contact with other hot fluids, initial encounter: Secondary | ICD-10-CM | POA: Insufficient documentation

## 2011-05-07 DIAGNOSIS — R7309 Other abnormal glucose: Secondary | ICD-10-CM | POA: Insufficient documentation

## 2011-05-07 DIAGNOSIS — M545 Low back pain, unspecified: Secondary | ICD-10-CM

## 2011-05-07 DIAGNOSIS — G589 Mononeuropathy, unspecified: Secondary | ICD-10-CM | POA: Insufficient documentation

## 2011-05-07 DIAGNOSIS — Z79899 Other long term (current) drug therapy: Secondary | ICD-10-CM | POA: Insufficient documentation

## 2011-05-07 DIAGNOSIS — T25229A Burn of second degree of unspecified foot, initial encounter: Secondary | ICD-10-CM | POA: Insufficient documentation

## 2011-05-07 DIAGNOSIS — X131XXA Other contact with steam and other hot vapors, initial encounter: Secondary | ICD-10-CM | POA: Insufficient documentation

## 2011-05-07 DIAGNOSIS — T310 Burns involving less than 10% of body surface: Secondary | ICD-10-CM | POA: Insufficient documentation

## 2011-05-07 DIAGNOSIS — K219 Gastro-esophageal reflux disease without esophagitis: Secondary | ICD-10-CM | POA: Insufficient documentation

## 2011-08-06 ENCOUNTER — Ambulatory Visit
Admission: RE | Admit: 2011-08-06 | Discharge: 2011-08-06 | Disposition: A | Discharge status: Home / Self Care | Payer: BC Managed Care – PPO | Source: Ambulatory Visit | Attending: Neurological Surgery | Admitting: Neurological Surgery

## 2011-08-06 ENCOUNTER — Other Ambulatory Visit: Payer: Self-pay | Admitting: Neurological Surgery

## 2011-08-06 DIAGNOSIS — T84498A Other mechanical complication of other internal orthopedic devices, implants and grafts, initial encounter: Secondary | ICD-10-CM

## 2011-08-06 DIAGNOSIS — S32009A Unspecified fracture of unspecified lumbar vertebra, initial encounter for closed fracture: Secondary | ICD-10-CM

## 2011-11-05 ENCOUNTER — Ambulatory Visit
Admission: RE | Admit: 2011-11-05 | Discharge: 2011-11-05 | Disposition: A | Discharge status: Home / Self Care | Payer: BC Managed Care – PPO | Source: Ambulatory Visit | Attending: Neurological Surgery | Admitting: Neurological Surgery

## 2011-11-05 ENCOUNTER — Other Ambulatory Visit: Payer: Self-pay | Admitting: Neurological Surgery

## 2011-11-05 DIAGNOSIS — M545 Low back pain: Secondary | ICD-10-CM

## 2012-03-02 IMAGING — CR DG ABDOMEN ACUTE W/ 1V CHEST
4 series · 4 of 4 positions shown · non-contrast
Comparison: No prior abdomen imaging.  Two-view chest x-ray
05/24/2010.

CLINICAL DATA: 1-day history of upper abdominal pain, progressively
worsening.  Nausea.

ACUTE ABDOMEN SERIES (ABDOMEN 2 VIEW & CHEST 1 VIEW) 08/09/2010:

[w abdomen upright *]
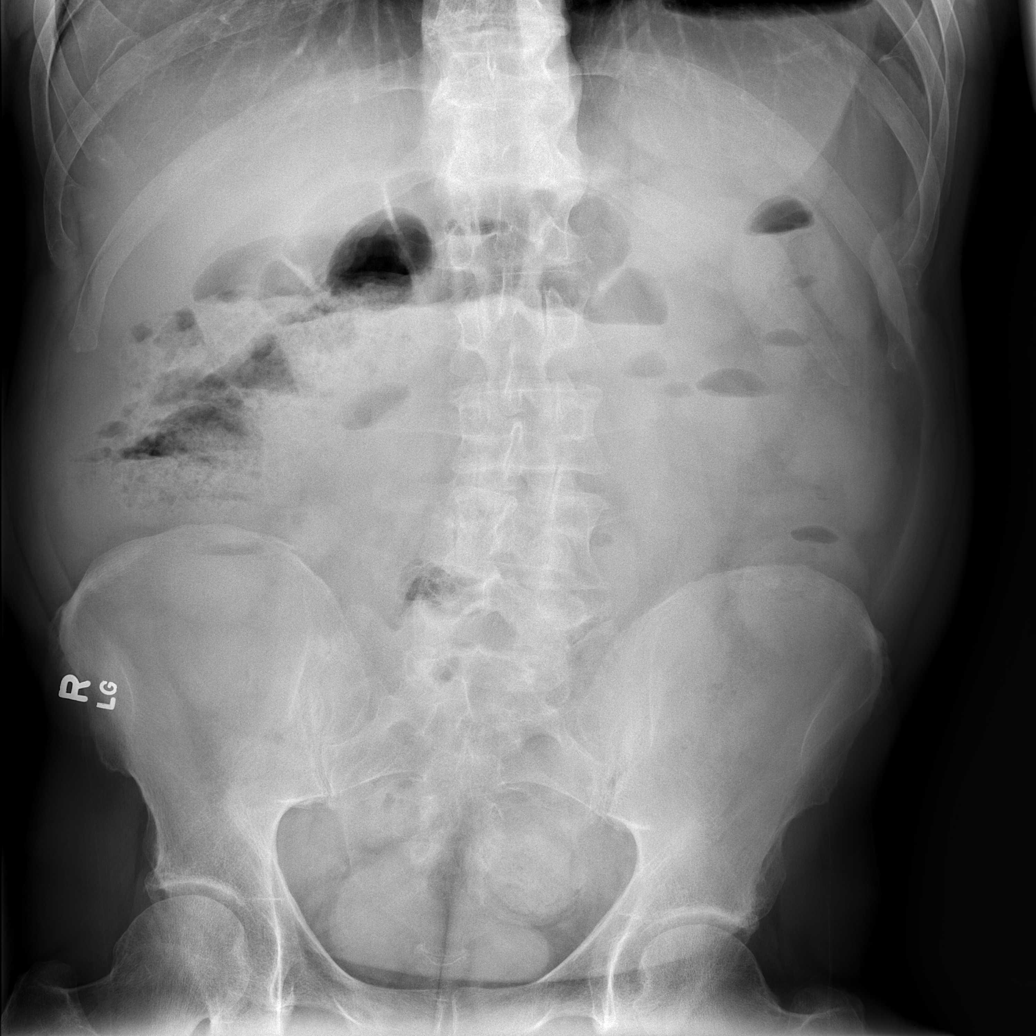

[w chest pa]
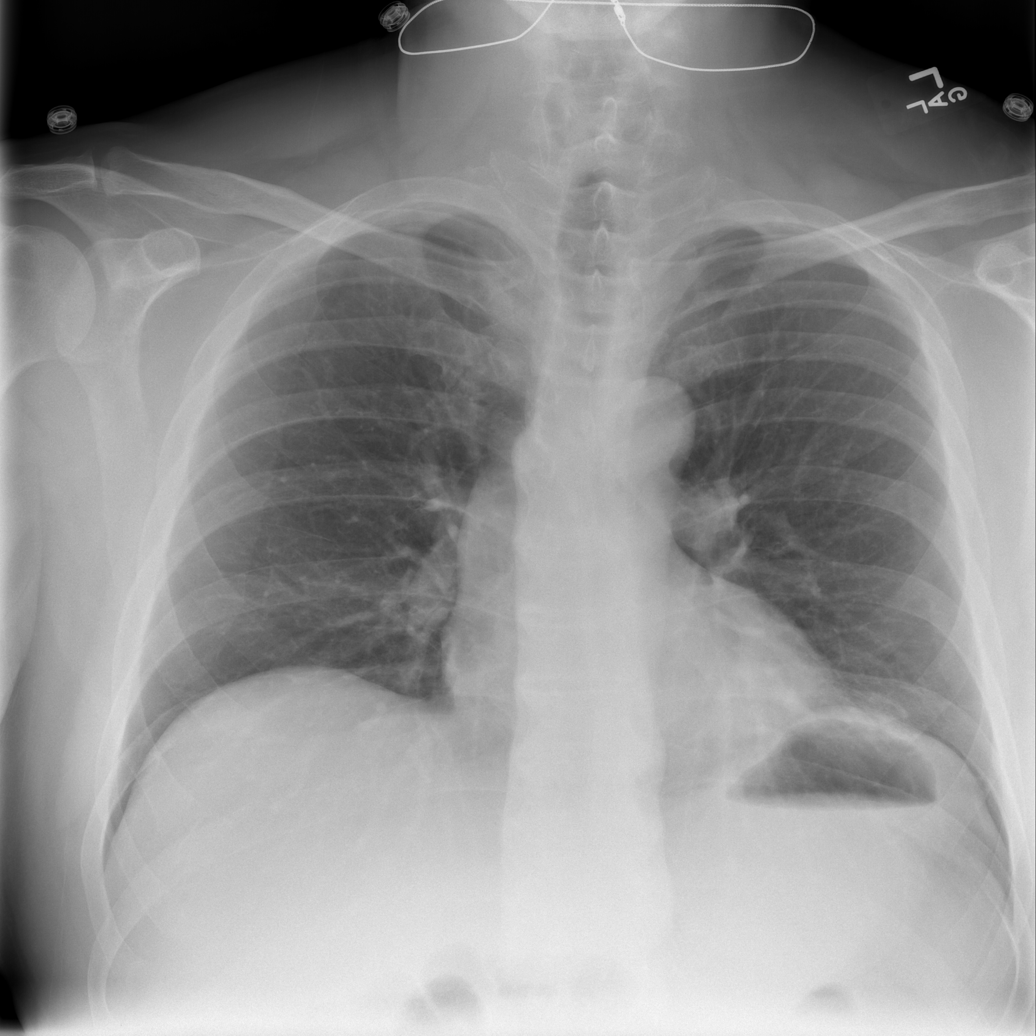

[t abdomen supine (1 of 2)]
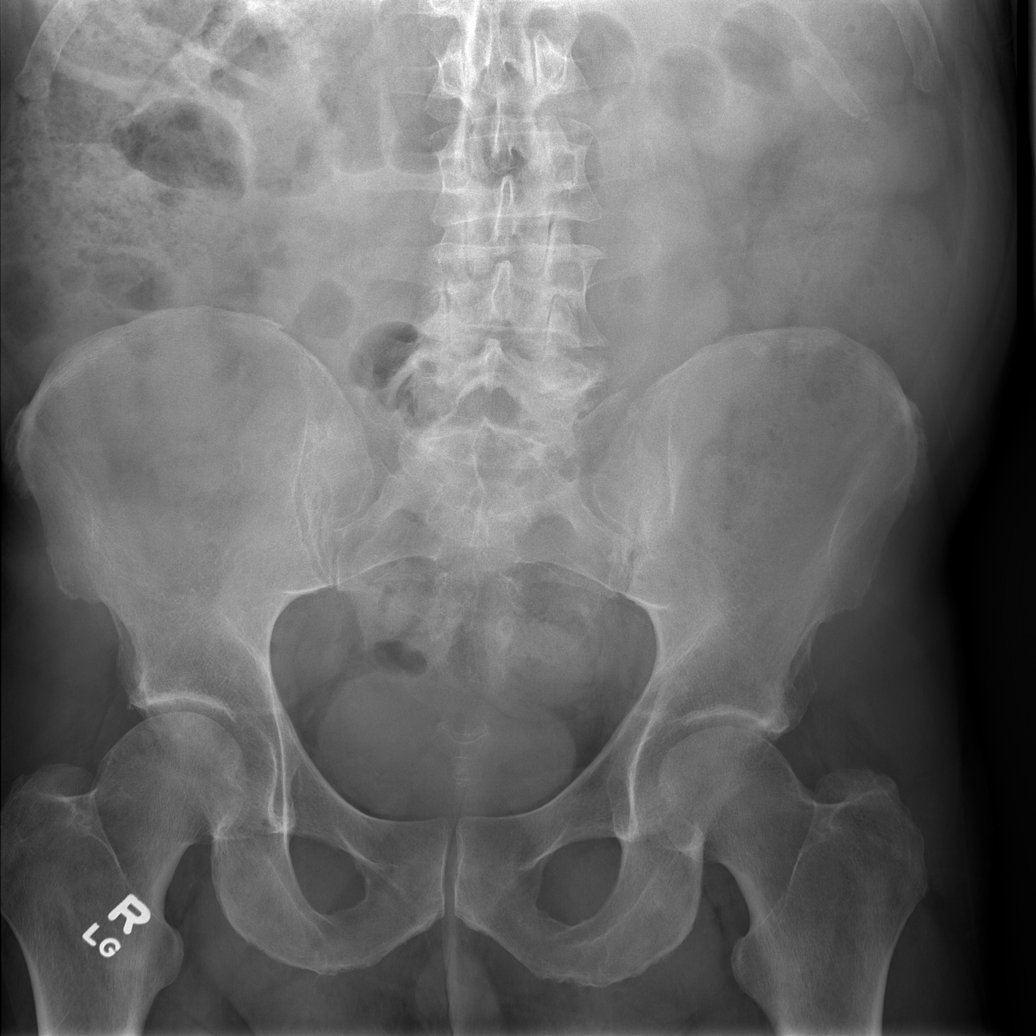

[t abdomen supine (2 of 2)]
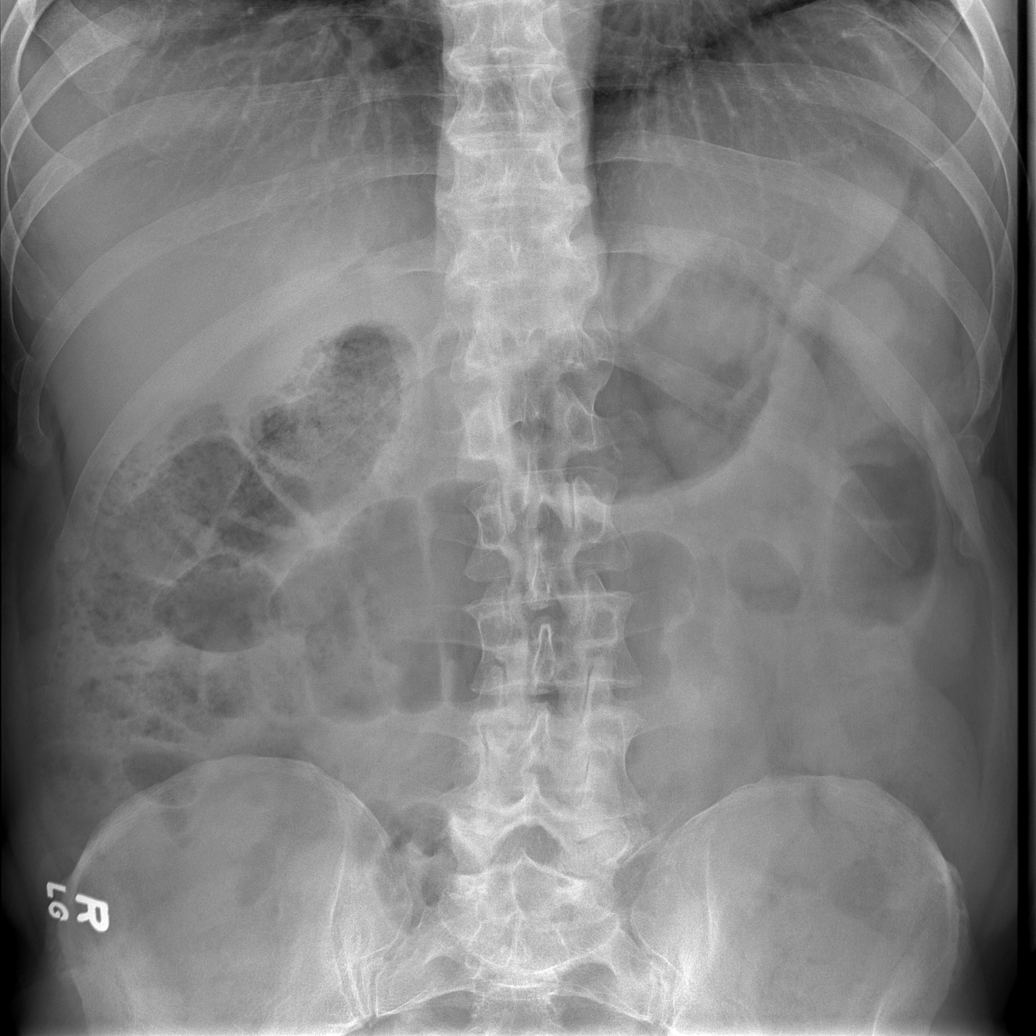

[4 of 4 positions shown; findings below may reference images not displayed]

FINDINGS: Bowel gas pattern unremarkable without evidence of
obstruction or significant ileus.  No evidence of free air or
significant air fluid levels on the erect image.  Air-fluid levels
in the colon consistent with liquid stool.  Expected amount of
stool in the colon.  No visible opaque urinary tract calculi.
Degenerative changes involving the lower thoracic and lumbar spine.

Cardiomediastinal silhouette unremarkable and unchanged.  Lungs
clear.  No pleural effusions.  No pneumothorax.
IMPRESSION: No acute abdominal or pulmonary abnormalities.

## 2012-07-20 ENCOUNTER — Other Ambulatory Visit: Payer: Self-pay | Admitting: Gastroenterology

## 2012-07-21 NOTE — Telephone Encounter (Signed)
I do not approve

## 2013-06-03 ENCOUNTER — Emergency Department (HOSPITAL_COMMUNITY)
Admission: EM | Admit: 2013-06-03 | Discharge: 2013-06-03 | Disposition: A | Discharge status: Home / Self Care | Payer: BC Managed Care – PPO | Attending: Emergency Medicine | Admitting: Emergency Medicine

## 2013-06-03 ENCOUNTER — Emergency Department (HOSPITAL_COMMUNITY): Payer: BC Managed Care – PPO

## 2013-06-03 ENCOUNTER — Encounter (HOSPITAL_COMMUNITY): Payer: Self-pay | Admitting: Emergency Medicine

## 2013-06-03 DIAGNOSIS — R0602 Shortness of breath: Secondary | ICD-10-CM | POA: Insufficient documentation

## 2013-06-03 DIAGNOSIS — Z79899 Other long term (current) drug therapy: Secondary | ICD-10-CM | POA: Insufficient documentation

## 2013-06-03 DIAGNOSIS — M545 Low back pain, unspecified: Secondary | ICD-10-CM | POA: Insufficient documentation

## 2013-06-03 DIAGNOSIS — R109 Unspecified abdominal pain: Secondary | ICD-10-CM | POA: Insufficient documentation

## 2013-06-03 DIAGNOSIS — J069 Acute upper respiratory infection, unspecified: Secondary | ICD-10-CM

## 2013-06-03 DIAGNOSIS — IMO0001 Reserved for inherently not codable concepts without codable children: Secondary | ICD-10-CM | POA: Insufficient documentation

## 2013-06-03 DIAGNOSIS — K5900 Constipation, unspecified: Secondary | ICD-10-CM | POA: Insufficient documentation

## 2013-06-03 DIAGNOSIS — R509 Fever, unspecified: Secondary | ICD-10-CM

## 2013-06-03 DIAGNOSIS — R0989 Other specified symptoms and signs involving the circulatory and respiratory systems: Secondary | ICD-10-CM | POA: Insufficient documentation

## 2013-06-03 DIAGNOSIS — R61 Generalized hyperhidrosis: Secondary | ICD-10-CM | POA: Insufficient documentation

## 2013-06-03 DIAGNOSIS — R000 Tachycardia, unspecified: Secondary | ICD-10-CM | POA: Insufficient documentation

## 2013-06-03 DIAGNOSIS — R059 Cough, unspecified: Secondary | ICD-10-CM | POA: Insufficient documentation

## 2013-06-03 DIAGNOSIS — R05 Cough: Secondary | ICD-10-CM | POA: Insufficient documentation

## 2013-06-03 LAB — URINALYSIS, ROUTINE W REFLEX MICROSCOPIC
Bilirubin Urine: NEGATIVE
Glucose, UA: NEGATIVE mg/dL
Hgb urine dipstick: NEGATIVE
Ketones, ur: NEGATIVE mg/dL
Leukocytes, UA: NEGATIVE
Nitrite: NEGATIVE
Protein, ur: NEGATIVE mg/dL
Specific Gravity, Urine: 1.025 (ref 1.005–1.030)
Urobilinogen, UA: 0.2 mg/dL (ref 0.0–1.0)
pH: 5 (ref 5.0–8.0)

## 2013-06-03 LAB — CBC WITH DIFFERENTIAL/PLATELET
Basophils Absolute: 0 10*3/uL (ref 0.0–0.1)
Basophils Relative: 0 % (ref 0–1)
Eosinophils Absolute: 0.1 10*3/uL (ref 0.0–0.7)
Eosinophils Relative: 1 % (ref 0–5)
HCT: 42.7 % (ref 39.0–52.0)
Hemoglobin: 14.6 g/dL (ref 13.0–17.0)
Lymphocytes Relative: 4 % — ABNORMAL LOW (ref 12–46)
Lymphs Abs: 0.4 10*3/uL — ABNORMAL LOW (ref 0.7–4.0)
MCH: 30.9 pg (ref 26.0–34.0)
MCHC: 34.2 g/dL (ref 30.0–36.0)
MCV: 90.3 fL (ref 78.0–100.0)
Monocytes Absolute: 0.1 10*3/uL (ref 0.1–1.0)
Monocytes Relative: 1 % — ABNORMAL LOW (ref 3–12)
Neutro Abs: 8.9 10*3/uL — ABNORMAL HIGH (ref 1.7–7.7)
Neutrophils Relative %: 94 % — ABNORMAL HIGH (ref 43–77)
Platelets: 209 10*3/uL (ref 150–400)
RBC: 4.73 MIL/uL (ref 4.22–5.81)
RDW: 13.5 % (ref 11.5–15.5)
WBC: 9.5 10*3/uL (ref 4.0–10.5)

## 2013-06-03 LAB — BASIC METABOLIC PANEL
BUN: 18 mg/dL (ref 6–23)
CO2: 24 mEq/L (ref 19–32)
Calcium: 9 mg/dL (ref 8.4–10.5)
Chloride: 95 mEq/L — ABNORMAL LOW (ref 96–112)
Creatinine, Ser: 0.91 mg/dL (ref 0.50–1.35)
GFR calc Af Amer: >90 mL/min (ref >90)
GFR calc non Af Amer: >90 mL/min (ref >90)
Glucose, Bld: 130 mg/dL — ABNORMAL HIGH (ref 70–99)
Potassium: 3.5 mEq/L (ref 3.5–5.1)
Sodium: 132 mEq/L — ABNORMAL LOW (ref 135–145)

## 2013-06-03 MED ORDER — SODIUM CHLORIDE 0.9 % IV BOLUS (SEPSIS)
1000.0000 mL | Freq: Once | INTRAVENOUS | Status: AC
  Administered 2013-06-03: 1000 mL via INTRAVENOUS

## 2013-06-03 MED ORDER — GUAIFENESIN ER 1200 MG PO TB12: 1.0000 | ORAL_TABLET | Freq: Two times a day (BID) | ORAL | Status: DC

## 2013-06-03 MED ORDER — ACETAMINOPHEN 500 MG PO TABS
1000.0000 mg | ORAL_TABLET | Freq: Once | ORAL | Status: AC
  Administered 2013-06-03: 1000 mg via ORAL
  Filled 2013-06-03: qty 2

## 2013-06-03 MED ORDER — IBUPROFEN 800 MG PO TABS: 800.0000 mg | ORAL_TABLET | Freq: Three times a day (TID) | ORAL | Status: DC | PRN

## 2013-06-03 MED ORDER — AZITHROMYCIN 250 MG PO TABS: 250.0000 mg | ORAL_TABLET | Freq: Every day | ORAL | Status: DC

## 2013-06-03 MED ORDER — PROMETHAZINE-DM 6.25-15 MG/5ML PO SYRP: 5.0000 mL | ORAL_SOLUTION | Freq: Four times a day (QID) | ORAL | Status: DC | PRN

## 2013-06-03 NOTE — ED Notes (Signed)
Bed: [facility] Expected date:  Expected time:  Means of arrival:  Comments: Flu like sx

## 2013-06-03 NOTE — ED Provider Notes (Signed)
  Medical screening examination/treatment/procedure(s) were performed by non-physician practitioner and as supervising physician I was immediately available for consultation/collaboration.    Gerhard Munch, MD 06/03/13 2348

## 2013-06-03 NOTE — ED Notes (Signed)
Per EMS-pt c/o of flu like symptoms for the past 2 days. Chills fever 99.3. Productive cough brown sputum. Pipe smoker. SOB 96% RA.

## 2013-06-03 NOTE — ED Provider Notes (Signed)
CSN: [id number]     Arrival date & time 06/03/13  1530 History   First MD Initiated Contact with Patient 06/03/13 1534     Chief Complaint  Patient presents with  . Generalized Body Aches  . Cough   (Consider location/radiation/quality/duration/timing/severity/associated sxs/prior Treatment) HPI Pt c/o of 2 days hx of fever, malaise, generalized weakness, and fatigue. He says he began to feeling hot (measured temp at 99.3), sweaty, non-productive cough, have slower thought process, mild HA, sinus congestion, lower back/flank pain, and constipation. He has taken antihistamine, Cenna, and ibuprofen; nothing has made him feel better. He began feeling short of breath and much warmer before coming to the hospital; the shortness of breath has not subsided. Pt c/o constipation; last bowel movement was 3 days ago. Pt normally has a BM every 3 days; pt reports he is on a lot of pain medication for his LBP and sometimes gets constipated. He notes his low back pain/flank pain is different than his normal LBP. Pt denies HA, vomiting, nausea, throat pain, cough, diarrhea, abdominal pain (although he notes he always is tender to palpation in his RLQ), urinary symptoms, muscle aches, chest pain, sensory changes. Pt is a middle Engineer, site. History reviewed. No pertinent past medical history. History reviewed. No pertinent past surgical history. History reviewed. No pertinent family history. History  Substance Use Topics  . Smoking status: Current Every Day Smoker    Types: Pipe  . Smokeless tobacco: Not on file  . Alcohol Use: Not on file    Review of Systems All other systems negative except as documented in the HPI. All pertinent positives and negatives as reviewed in the HPI.  Allergies  Avelox  Home Medications   Current Outpatient Rx  Name  Route  Sig  Dispense  Refill  . atenolol (TENORMIN) 25 MG tablet   Oral   Take 25 mg by mouth daily.         . DULoxetine (CYMBALTA) 60 MG  capsule   Oral   Take 60 mg by mouth daily.         Marland Kitchen dutasteride (AVODART) 0.5 MG capsule   Oral   Take 0.5 mg by mouth daily.         . metFORMIN (GLUCOPHAGE) 500 MG tablet   Oral   Take 500 mg by mouth daily with breakfast.         . Multiple Vitamin (MULTIVITAMIN WITH MINERALS) TABS tablet   Oral   Take 1 tablet by mouth daily.         Marland Kitchen omeprazole (PRILOSEC) 20 MG capsule   Oral   Take 20 mg by mouth at bedtime.          Marland Kitchen oxymorphone (OPANA) 10 MG tablet   Oral   Take 10 mg by mouth every 12 (twelve) hours as needed for pain.         . pregabalin (LYRICA) 75 MG capsule   Oral   Take 75 mg by mouth 2 (two) times daily.         Marland Kitchen rOPINIRole (REQUIP) 1 MG tablet   Oral   Take 1 mg by mouth at bedtime.         . tamsulosin (FLOMAX) 0.4 MG CAPS capsule   Oral   Take 0.4 mg by mouth daily.         Marland Kitchen telmisartan-hydrochlorothiazide (MICARDIS HCT) 80-12.5 MG per tablet   Oral   Take 1 tablet by mouth daily.  BP 161/84  Pulse 112  Temp(Src) 102.5 F (39.2 C) (Oral)  Resp 20  SpO2 93% Physical Exam  Nursing note and vitals reviewed. Constitutional: He is oriented to person, place, and time. He appears well-developed and well-nourished. No distress.  HENT:  Head: Normocephalic and atraumatic.  Eyes: Pupils are equal, round, and reactive to light.  Cardiovascular: Regular rhythm, S1 normal, S2 normal, normal heart sounds and normal pulses.  Tachycardia present.   Pulmonary/Chest: Effort normal and breath sounds normal. No accessory muscle usage. Not tachypneic. No respiratory distress. He has no wheezes. He has no rales. He exhibits no tenderness.  Abdominal: Soft. Normal appearance and bowel sounds are normal. He exhibits no distension. There is tenderness (tenderness to deep palpation over RLQ) in the right lower quadrant. There is no CVA tenderness.  Neurological: He is alert and oriented to person, place, and time. He has normal  strength. No cranial nerve deficit or sensory deficit (grossly intact).  Skin: Skin is warm. No rash noted. He is diaphoretic. No erythema. No pallor.  Psychiatric: He has a normal mood and affect. His speech is normal and behavior is normal. Judgment and thought content normal.    ED Course  Procedures (including critical care time) Labs Review Labs Reviewed  URINALYSIS, ROUTINE W REFLEX MICROSCOPIC  CBC WITH DIFFERENTIAL  BASIC METABOLIC PANEL   Patient is feeling dramatically better following, Tylenol and IV fluids.  Patient retreated for respiratory infection.  Advised to return here for any worsening in his condition.  I also says the patient to followup with his primary care Dr. patient, states, that MDM      Carlyle Dolly, PA-C 06/03/13 [phone]

## 2014-03-15 ENCOUNTER — Institutional Professional Consult (permissible substitution): Payer: Self-pay | Admitting: Neurology

## 2014-03-24 ENCOUNTER — Ambulatory Visit (INDEPENDENT_AMBULATORY_CARE_PROVIDER_SITE_OTHER): Payer: BC Managed Care – PPO | Admitting: Neurology

## 2014-03-24 ENCOUNTER — Encounter (INDEPENDENT_AMBULATORY_CARE_PROVIDER_SITE_OTHER): Payer: Self-pay

## 2014-03-24 ENCOUNTER — Encounter: Payer: Self-pay | Admitting: Neurology

## 2014-03-24 VITALS — BP 127/79 | HR 95 | Temp 97.6°F | Ht 75.0 in | Wt 244.0 lb

## 2014-03-24 DIAGNOSIS — G479 Sleep disorder, unspecified: Secondary | ICD-10-CM

## 2014-03-24 DIAGNOSIS — R0609 Other forms of dyspnea: Secondary | ICD-10-CM

## 2014-03-24 DIAGNOSIS — G4761 Periodic limb movement disorder: Secondary | ICD-10-CM

## 2014-03-24 DIAGNOSIS — R0989 Other specified symptoms and signs involving the circulatory and respiratory systems: Secondary | ICD-10-CM

## 2014-03-24 DIAGNOSIS — E669 Obesity, unspecified: Secondary | ICD-10-CM

## 2014-03-24 DIAGNOSIS — R351 Nocturia: Secondary | ICD-10-CM

## 2014-03-24 DIAGNOSIS — G2581 Restless legs syndrome: Secondary | ICD-10-CM

## 2014-03-24 DIAGNOSIS — R0683 Snoring: Secondary | ICD-10-CM

## 2014-03-24 HISTORY — DX: Sleep disorder, unspecified: G47.9

## 2014-03-24 NOTE — Patient Instructions (Addendum)

## 2014-03-24 NOTE — Progress Notes (Signed)
Subjective:    Patient ID: Douglas [name] [name] a 63 y.o. male.  HPI    Star Age, MD, PhD [facility] [address], [address] [address] [place], [place] [address]  Dear Dr. Dagmar Hait,   I saw your patient, Douglas [name], upon your kind request in my neurologic clinic today for initial consultation of his sleep disorder, in particular, concern for underlying sleep apnea in conjunction with insomnia. The patient [name] unaccompanied today. 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 He also has significant nocturia 3-4 times per average night. He does drink caffeine in the form of tea and coffee multiple times a day. His sister may have witnessed some apneas in him, when she visits. He smokes a pipe but denies any cigarette smoking. He does not drink alcohol regularly. He [name] single and lives alone and works full-time as a Presenter, broadcasting in middle school. He may sleep about 8 hours, but [name] never well rested. During the summer he likes to take a nap in the afternoons, about every other day.   He usually goes to bed by 10 PM and he falls asleep quickly, but he wakes up 3-4 times and usually [name] soaked in sweat and needs to change his night clothes up to 3 times a night. He has nocturia 3-4 times and Avodart and Flomax have.  He sleeps on his sides and his back. He has a history of tussive syncope. He has GER and omeprazole has helped. Snoring [name] reportedly loud.  He has smoked the pipe for about 40 years. He has a history of  sciatica and spinal stenosis.  He has RLS and [name] on ropinirole for about 2 years, which has helped.  His rise time [name] around 6 AM and he feels tired. He does not have morning HAs.  He denies cataplexy, sleep paralysis, hypnagogic or hypnopompic hallucinations, or sleep attacks. He does not report any vivid dreams, nightmares, dream enactments, or parasomnias, such as sleep talking or sleep walking. The patient has not had a sleep study or a home sleep test. His bedroom [name] usually dark and cool. There [name] no TV in the bedroom.   His Past Medical History [name] Significant For: Past Medical History  Diagnosis Date  . DM (diabetes mellitus)     His Past Surgical History [name] Significant For: No past surgical history on file.  His Family History [name] Significant For: Family History  Problem Relation Age of Onset  . Cancer Mother   . Heart failure Father   . Tuberculosis Maternal Uncle   . Cancer - Colon Maternal Uncle   . Hypertension Paternal Grandfather     His Social History [name] Significant For: History   Social History  . Marital Status: Single    Spouse Name: N/A    Number of Children: N/A  . Years of Education: N/A   Social History Main Topics  . Smoking status: Current Every Day Smoker    Types: Pipe  . Smokeless tobacco: None  . Alcohol Use: No  . Drug Use: No  . Sexual Activity: None   Other Topics Concern  .  None   Social History Narrative  . None    His Allergies Are:  Allergies  Allergen Reactions  . Avelox [Moxifloxacin]     Fatigue  :   His Current Medications Are:  Outpatient Encounter Prescriptions as of 03/24/2014  Medication Sig  . amoxicillin-clavulanate (AUGMENTIN) 500-125 MG per tablet Take 1 tablet by mouth 2 (two) times daily.  . DULoxetine (CYMBALTA) 60 MG capsule Take 60 mg by mouth daily.  Marland Kitchen dutasteride (AVODART) 0.5 MG capsule Take 0.5 mg by mouth daily.  Marland Kitchen HYDROcodone-acetaminophen (NORCO/VICODIN) 5-325 MG per tablet Take 1 tablet by mouth  every 6 (six) hours as needed.  Marland Kitchen ibuprofen (ADVIL,MOTRIN) 800 MG tablet Take 1 tablet (800 mg total) by mouth every 8 (eight) hours as needed for pain.  . metFORMIN (GLUCOPHAGE) 500 MG tablet Take 500 mg by mouth daily with breakfast.  . metoprolol succinate (TOPROL-XL) 50 MG 24 hr tablet Take 1 tablet by mouth daily.  . Multiple Vitamin (MULTIVITAMIN WITH MINERALS) TABS tablet Take 1 tablet by mouth daily.  Marland Kitchen omeprazole (PRILOSEC) 20 MG capsule Take 20 mg by mouth at bedtime.   Marland Kitchen oxymorphone (OPANA) 10 MG tablet Take 10 mg by mouth every 12 (twelve) hours as needed for pain.  . pregabalin (LYRICA) 75 MG capsule Take 75 mg by mouth 2 (two) times daily.  Marland Kitchen rOPINIRole (REQUIP) 1 MG tablet Take 1 mg by mouth at bedtime.  . tamsulosin (FLOMAX) 0.4 MG CAPS capsule Take 0.4 mg by mouth daily.  Marland Kitchen telmisartan-hydrochlorothiazide (MICARDIS HCT) 80-12.5 MG per tablet Take 1 tablet by mouth daily.  . [DISCONTINUED] Guaifenesin 1200 MG TB12 Take 1 tablet (1,200 mg total) by mouth 2 (two) times daily.  . [DISCONTINUED] atenolol (TENORMIN) 25 MG tablet Take 25 mg by mouth daily.  . [DISCONTINUED] azithromycin (ZITHROMAX) 250 MG tablet Take 1 tablet (250 mg total) by mouth daily. 2 tablets by mouth day one and then 1 tablet by mouth daily days 2 through 5  . [DISCONTINUED] promethazine-dextromethorphan (PROMETHAZINE-DM) 6.25-15 MG/5ML syrup Take 5 mLs by mouth 4 (four) times daily as needed for cough.  :  Review of Systems:  Out of a complete 14 point review of systems, all are reviewed and negative with the exception of these symptoms as listed below:   Review of Systems  Constitutional: Negative.   HENT: Negative.   Eyes: Negative.   Respiratory: Positive for cough.   Cardiovascular: Negative.   Gastrointestinal: Negative.   Endocrine: Negative.   Genitourinary: Negative.   Musculoskeletal: Negative.   Skin: Negative.   Allergic/Immunologic: Positive for immunocompromised state.  Neurological:  Positive for tremors (right hand) and numbness (feet).  Hematological: Negative.   Psychiatric/Behavioral: Positive for sleep disturbance (restless leg, eds).    Objective:  Neurologic Exam  Physical Exam Physical Examination:   Filed Vitals:   03/24/14 1116  BP: 127/79  Pulse: 95  Temp: 97.6 F (36.4 C)    General Examination: The patient [name] a very pleasant 63 y.o. male in no acute distress. He appears well-developed and well-nourished and well groomed. He [name] overweight.   HEENT: Normocephalic, atraumatic, pupils are equal, round and reactive to light and accommodation. Funduscopic exam [name] normal with sharp disc margins noted. Extraocular tracking [name] good without limitation to gaze excursion or nystagmus noted. Normal smooth pursuit [name] noted. Hearing [name] grossly intact. Tympanic membranes are clear bilaterally. Face [name] symmetric with normal facial animation and normal facial sensation. Speech [name] clear with no dysarthria noted. There [name] no  hypophonia. There [name] no lip, neck/head, jaw or voice tremor. Neck [name] supple with full range of passive and active motion. There are no carotid bruits on auscultation. Oropharynx exam reveals: moderate mouth dryness, tongue [name] discolored, somewhat poor dental hygiene and moderate airway crowding, due to larger tongue and large uvula. Mallampati [name] class II. Tongue protrudes centrally and palate elevates symmetrically. Tonsils are absent. Neck size [name] 18.5 inches. He has a veyr mild overbite. Nasal inspection reveals no significant nasal mucosal bogginess or redness and no septal deviation.   Chest: Clear to auscultation without wheezing, rhonchi or crackles noted.  Heart: S1+S2+0, regular and normal without murmurs, rubs or gallops noted.   Abdomen: Soft, non-tender and non-distended with normal bowel sounds appreciated on auscultation.  Extremities: There [name] no pitting edema in the distal lower extremities bilaterally. Pedal pulses are intact.  Skin:  Warm and dry without trophic changes noted. There are no varicose veins.  Musculoskeletal: exam reveals no obvious joint deformities, tenderness or joint swelling or erythema.   Neurologically:  Mental status: The patient [name] awake, alert and oriented in all 4 spheres. His immediate and remote memory, attention, language skills and fund of knowledge are appropriate. There [name] no evidence of aphasia, agnosia, apraxia or anomia. Speech [name] clear with normal prosody and enunciation. Thought process [name] linear. Mood [name] normal and affect [name] normal.  Cranial nerves II - XII are as described above under HEENT exam. In addition: shoulder shrug [name] normal with equal shoulder height noted. Motor exam: Normal bulk, strength and tone [name] noted. There [name] no drift, or rebound. There [name] a fast RUE postural and and very minimal action tremor and a milder resting component, tremor [name] relatively fast in frequency. Romberg [name] positive. Reflexes are 2+ in the UEs and absent in the LEs. Babinski: Toes are flexor bilaterally. Fine motor skills and coordination: intact with normal finger taps, normal hand movements, normal rapid alternating patting, normal foot taps and normal foot agility.  Cerebellar testing: No dysmetria or intention tremor on finger to nose testing. Heel to shin [name] unremarkable bilaterally. There [name] no truncal or gait ataxia.  Sensory exam: intact to light touch, pinprick, vibration, temperature sense in the upper extremities, and there [name] decrease sensation to all modalities in both LEs up to the knees.  Gait, station and balance: He stands easily. No veering to one side [name] noted. No leaning to one side [name] noted. Posture [name] stooped mildly, advanced for age and stance [name] slightly wide based. Gait shows decrease in R arm swing. Tandem walk [name] slightly difficult for him. Intact toe and heel stance [name] noted.               Assessment and Plan:   In summary, KACEY VICUNA [name] a very pleasant 63 y.o.-year old male  with a history and physical exam concerning for obstructive sleep apnea (OSA), based on his history of snoring, night time sleep disruption, non-restorative sleep, nocturia and tighter looking airway as well as overweight state. 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  I had a long chat with the patient about my findings and the diagnosis of OSA, its prognosis and treatment options. We talked about medical treatments, surgical interventions and non-pharmacological approaches. I explained in particular the risks and ramifications of untreated moderate to severe OSA, especially with respect to developing cardiovascular disease down the Road, including congestive heart failure, difficult to treat hypertension, cardiac arrhythmias, or stroke. Even type 2 diabetes has, in  part, been linked to untreated OSA. Symptoms of untreated OSA include daytime sleepiness, memory problems, mood irritability and mood disorder such as depression and anxiety, lack of energy, as well as recurrent headaches, especially morning headaches. We talked about trying to maintain a healthy lifestyle in general, as well as the importance of weight control. I encouraged the patient to eat healthy, exercise daily and keep well hydrated, to keep a scheduled bedtime and wake time routine, to not skip any meals and eat healthy snacks in between meals. I advised the patient not to drive when feeling sleepy. He [name] encouraged to reduce his caffeine intake and drink more water.   I recommended the following at this time: sleep study with potential positive airway pressure titration.  I explained the sleep test procedure to the patient and also outlined possible surgical and non-surgical treatment options of OSA, including the use of a custom-made dental device (which would require a referral to a specialist dentist or oral surgeon), upper airway surgical options, such as pillar implants, radiofrequency surgery, tongue base surgery, and UPPP (which would  involve a referral to an ENT surgeon). Rarely, jaw surgery such as mandibular advancement may be considered.  I also explained the CPAP treatment option to the patient, who indicated that he would be willing to try CPAP if the need arises. I explained the importance of being compliant with PAP treatment, not only for insurance purposes but primarily to improve His symptoms, and for the patient's long term health benefit, including to reduce His cardiovascular risks. I answered all his questions today and the patient was in agreement. I would like to see him back after the sleep study [name] completed and encouraged him to call with any interim questions, concerns, problems or updates.   Thank you very much for allowing me to participate in the care of this nice patient. If I can be of any further assistance to you please do not hesitate to call me at [phone].  Sincerely,   Star Age, MD, PhD

## 2014-04-19 ENCOUNTER — Other Ambulatory Visit: Payer: Self-pay | Admitting: Neurological Surgery

## 2014-04-19 DIAGNOSIS — M5416 Radiculopathy, lumbar region: Secondary | ICD-10-CM

## 2014-04-21 ENCOUNTER — Other Ambulatory Visit: Payer: Self-pay | Admitting: Neurological Surgery

## 2014-04-21 ENCOUNTER — Ambulatory Visit
Admission: RE | Admit: 2014-04-21 | Discharge: 2014-04-21 | Disposition: A | Discharge status: Home / Self Care | Payer: BC Managed Care – PPO | Source: Ambulatory Visit | Attending: Neurological Surgery | Admitting: Neurological Surgery

## 2014-04-21 DIAGNOSIS — M5416 Radiculopathy, lumbar region: Secondary | ICD-10-CM

## 2014-04-27 ENCOUNTER — Ambulatory Visit
Admission: RE | Admit: 2014-04-27 | Discharge: 2014-04-27 | Disposition: A | Discharge status: Home / Self Care | Payer: BC Managed Care – PPO | Source: Ambulatory Visit | Attending: Neurological Surgery | Admitting: Neurological Surgery

## 2014-04-27 DIAGNOSIS — M5416 Radiculopathy, lumbar region: Secondary | ICD-10-CM

## 2014-06-09 ENCOUNTER — Other Ambulatory Visit: Payer: Self-pay | Admitting: Neurological Surgery

## 2014-06-09 DIAGNOSIS — M48061 Spinal stenosis, lumbar region without neurogenic claudication: Secondary | ICD-10-CM

## 2014-06-17 ENCOUNTER — Ambulatory Visit
Admission: RE | Admit: 2014-06-17 | Discharge: 2014-06-17 | Disposition: A | Discharge status: Home / Self Care | Payer: BC Managed Care – PPO | Source: Ambulatory Visit | Attending: Neurological Surgery | Admitting: Neurological Surgery

## 2014-06-17 ENCOUNTER — Other Ambulatory Visit: Payer: Self-pay | Admitting: Neurological Surgery

## 2014-06-17 DIAGNOSIS — M48061 Spinal stenosis, lumbar region without neurogenic claudication: Secondary | ICD-10-CM

## 2014-06-17 MED ORDER — METHYLPREDNISOLONE ACETATE 40 MG/ML INJ SUSP (RADIOLOG
120.0000 mg | Freq: Once | INTRAMUSCULAR | Status: AC
  Administered 2014-06-17: 120 mg via EPIDURAL

## 2014-06-17 MED ORDER — IOHEXOL 180 MG/ML  SOLN
1.0000 mL | Freq: Once | INTRAMUSCULAR | Status: AC | PRN
  Administered 2014-06-17: 1 mL via EPIDURAL

## 2014-06-17 NOTE — Discharge Instructions (Signed)

## 2014-07-21 ENCOUNTER — Other Ambulatory Visit: Payer: Self-pay | Admitting: Neurological Surgery

## 2014-07-21 DIAGNOSIS — M48061 Spinal stenosis, lumbar region without neurogenic claudication: Secondary | ICD-10-CM

## 2014-09-21 ENCOUNTER — Encounter: Payer: Self-pay | Admitting: Diagnostic Neuroimaging

## 2014-09-21 ENCOUNTER — Ambulatory Visit (INDEPENDENT_AMBULATORY_CARE_PROVIDER_SITE_OTHER): Payer: BC Managed Care – PPO | Admitting: Diagnostic Neuroimaging

## 2014-09-21 VITALS — BP 120/74 | HR 74 | Temp 97.7°F | Ht 75.0 in | Wt 235.2 lb

## 2014-09-21 DIAGNOSIS — M48062 Spinal stenosis, lumbar region with neurogenic claudication: Secondary | ICD-10-CM

## 2014-09-21 DIAGNOSIS — G20 Parkinson's disease: Secondary | ICD-10-CM

## 2014-09-21 DIAGNOSIS — R259 Unspecified abnormal involuntary movements: Principal | ICD-10-CM

## 2014-09-21 DIAGNOSIS — G20C Parkinsonism, unspecified: Secondary | ICD-10-CM

## 2014-09-21 DIAGNOSIS — R258 Other abnormal involuntary movements: Secondary | ICD-10-CM

## 2014-09-21 DIAGNOSIS — G252 Other specified forms of tremor: Secondary | ICD-10-CM

## 2014-09-21 DIAGNOSIS — M4806 Spinal stenosis, lumbar region: Secondary | ICD-10-CM

## 2014-09-21 DIAGNOSIS — E1142 Type 2 diabetes mellitus with diabetic polyneuropathy: Secondary | ICD-10-CM

## 2014-09-21 MED ORDER — CARBIDOPA-LEVODOPA 25-100 MG PO TABS: 1.0000 | ORAL_TABLET | Freq: Three times a day (TID) | ORAL | Status: DC

## 2014-09-21 NOTE — Progress Notes (Signed)
[facility]  PATIENT: Douglas Price DOB: December 30, 1950  REFERRING CLINICIAN: Dr. Coralie Common  HISTORY FROM: patient and brother  REASON FOR VISIT: new consult    HISTORICAL  CHIEF COMPLAINT:  Chief Complaint  Patient presents with  . Tremors    HISTORY OF PRESENT ILLNESS:   63 year old right handed male with diabetic neuropathy, lumbar spinal stenosis (s/p surgery x 2), hypertension, diabetes and depression, here for evaluation of right hand resting tremor.  Patient reports onset of tremor in right hand around 2006. It has gradually progressed. Initially tremor was postural, but over the last 1-2 years he is noticing resting tremor. He has also noticed some change in his handwriting, which is small at times. Patient also has had progressive gait and balance difficulty, which has previously been attributed to combination of lumbar spinal stenosis as well as peripheral neuropathy.  Interestingly his peripheral neuropathy symptoms started also in 2006 with numbness and tingling in his toes, regressing to feet, ankles, calves and knees. The progression developed over one year. 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 Patient was treated with IVIG 2 cycles, but could not continue due to significant side effects. Patient was initially evaluated by Dr. Jacolyn Reedy, then EMG by Dr. Mendel Corning, then transitioned to Dr. Tillman [facility]), who followed patient for several years and later reversed diagnosis of CIDP to diabetic neuropathy alone. Around 2010-12, patient was diagnosed with lumbar spinal stenosis, and treated surgically.  Other symptoms include constipation, loss of sense of smell and taste for past for 5 years, insomnia, depression, anxiety, vivid dreams, acting out dreams.    Patient was also evaluated in our sleep clinic by Dr. Rexene Alberts in July 2015 for  Insomnia and consideration  of possible sleep apnea diagnosis. He was recommended to have sleep study but this has not been done yet.   REVIEW OF SYSTEMS: Full 14 system review of systems performed and notable only for itching constipation allergies runny nose skin sensitivity depression not enough sleep snoring restless legs insomnia numbness weakness dizziness tremor feeling hot cough.     ALLERGIES: Allergies  Allergen Reactions  . Avelox [Moxifloxacin]     Fatigue    HOME MEDICATIONS: Outpatient Prescriptions Prior to Visit  Medication Sig Dispense Refill  . DULoxetine (CYMBALTA) 60 MG capsule Take 60 mg by mouth daily.    Marland Kitchen dutasteride (AVODART) 0.5 MG capsule Take 0.5 mg by mouth daily.    Marland Kitchen HYDROcodone-acetaminophen (NORCO/VICODIN) 5-325 MG per tablet Take 1 tablet by mouth every 6 (six) hours as needed.    Marland Kitchen ibuprofen (ADVIL,MOTRIN) 800 MG tablet Take 1 tablet (800 mg total) by mouth every 8 (eight) hours as needed for pain. 21 tablet 0  . metFORMIN (GLUCOPHAGE) 500 MG tablet Take 500 mg by mouth daily with breakfast.    . metoprolol succinate (TOPROL-XL) 50 MG 24 hr tablet Take 1 tablet by mouth daily.    . Multiple Vitamin (MULTIVITAMIN WITH MINERALS) TABS tablet Take 1 tablet by mouth daily.    Marland Kitchen omeprazole (PRILOSEC) 20 MG capsule Take 20 mg by mouth 2 (two) times daily before a meal.     . oxymorphone (OPANA) 10 MG tablet Take 10 mg by mouth every 12 (twelve) hours as needed for pain.    . pregabalin (LYRICA) 75 MG capsule Take 75 mg by mouth 2 (two) times daily.    Marland Kitchen rOPINIRole (REQUIP) 1 MG tablet Take  1 mg by mouth at bedtime.    . tamsulosin (FLOMAX) 0.4 MG CAPS capsule Take 0.4 mg by mouth daily.    Marland Kitchen telmisartan-hydrochlorothiazide (MICARDIS HCT) 80-12.5 MG per tablet Take 1 tablet by mouth daily.    Marland Kitchen amoxicillin-clavulanate (AUGMENTIN) 500-125 MG per tablet Take 1 tablet by mouth 2 (two) times daily.     No facility-administered medications prior to visit.    PAST MEDICAL HISTORY: Past  Medical History  Diagnosis Date  . DM (diabetes mellitus)   . Benign neoplasm of colon   . Abnormal involuntary movements(781.0)   . Cough   . Pain in limb   . Restless legs syndrome (RLS)   . Obesity, unspecified   . Unspecified local infection of skin and subcutaneous tissue   . Blisters with epidermal loss due to burn (second degree) of foot   . Tobacco use disorder   . Hypertrophy of prostate with urinary obstruction and other lower urinary tract symptoms (LUTS)   . Spinal stenosis, unspecified region other than cervical   . Loss of weight   . Acute bronchitis   . Allergic rhinitis due to pollen   . Degeneration of lumbar or lumbosacral intervertebral disc   . Depressive disorder, not elsewhere classified   . Diverticulosis of colon (without mention of hemorrhage)   . Esophageal reflux   . Unspecified essential hypertension   . Edema   . Unspecified disease of pericardium   . Varices of other sites   . Sleep disturbance 03/24/2014    PAST SURGICAL HISTORY: Past Surgical History  Procedure Laterality Date  . Saphenous vein graft resection Right 1991  . Spine surgery  11/2010 & 12/2010    FAMILY HISTORY: Family History  Problem Relation Age of Onset  . Cancer Mother   . Heart failure Father   . Tuberculosis Maternal Uncle   . Cancer - Colon Maternal Uncle   . Hypertension Paternal Grandfather     SOCIAL HISTORY:  History   Social History  . Marital Status: Single    Spouse Name: N/A    Number of Children: 0  . Years of Education: BA   Occupational History  .  [place] History Main Topics  . Smoking status: Current Every Day Smoker    Types: Pipe  . Smokeless tobacco: Not on file     Comment: moderate  . Alcohol Use: No  . Drug Use: No  . Sexual Activity: Not on file   Other Topics Concern  . Not on file   Social History Narrative   Patient lives at home alone.   Caffeine Use: 2 cups of coffee/tea daily     PHYSICAL  EXAM  Filed Vitals:   09/21/14 1443  BP: 120/74  Pulse: 74  Temp: 97.7 F (36.5 C)  TempSrc: Oral  Height: 6\' 3"  (1.905 m)  Weight: 235 lb 3.2 oz (106.686 kg)    Body mass index is 29.4 kg/(m^2).   Visual Acuity Screening   Right eye Left eye Both eyes  Without correction:     With correction: 20/40 20/30     No flowsheet data found.  GENERAL EXAM: Patient is in no distress; well developed, nourished and groomed; neck is supple  CARDIOVASCULAR: Regular rate and rhythm, no murmurs, no carotid bruits  NEUROLOGIC: MENTAL STATUS: awake, alert, oriented to person, place and time, recent and remote memory intact, normal attention and concentration, language fluent, comprehension intact, naming intact, fund  of knowledge appropriate CRANIAL NERVE: no papilledema on fundoscopic exam, pupils equal and reactive to light, visual fields full to confrontation, extraocular muscles intact, no nystagmus, facial sensation and strength symmetric, hearing intact, palate elevates symmetrically, uvula midline, shoulder shrug symmetric, tongue midline. MOTOR: CONSTANT RESTING TREMOR IN RUE; POSTURAL > ACTION TREMOR IN RUE. MODERATE COGWHEELING IN RUE >> LUE; MOD BRADYKINESIA IN RUE. Normal bulk and tone, full strength in the BUE, BLE; EXCEPT CANNOT STAND ON TOES (PLANTAR FLEXION WEAKNESS) SENSORY: ABSENT VIBRATION AT TOES AND ANKLES; HIGH ARCHES AND HAMMER TOES COORDINATION: finger-nose-finger, fine finger movements normal REFLEXES: BUE TRACE; KNEES 0, ANKLES 0 GAIT/STATION: SLOW, SHORT STEPS; LIMPS; STIFF GAIT; CANNOT STAND ON TOES; DIFF WITH HEEL STANDING; CANNOT TANDEM; ROMBERG NEG    DIAGNOSTIC DATA (LABS, IMAGING, TESTING) - I reviewed patient records, labs, notes, testing and imaging myself where available.  Lab Results  Component Value Date   WBC 9.5 06/03/2013   HGB 14.6 06/03/2013   HCT 42.7 06/03/2013   MCV 90.3 06/03/2013   PLT 209 06/03/2013      Component Value Date/Time    NA 132* 06/03/2013 1639   K 3.5 06/03/2013 1639   CL 95* 06/03/2013 1639   CO2 24 06/03/2013 1639   GLUCOSE 130* 06/03/2013 1639   BUN 18 06/03/2013 1639   CREATININE 0.91 06/03/2013 1639   CALCIUM 9.0 06/03/2013 1639   PROT 6.7 08/09/2010 0220   ALBUMIN 3.7 08/09/2010 0220   AST 45* 08/09/2010 0220   ALT 54* 08/09/2010 0220   ALKPHOS 44 08/09/2010 0220   BILITOT 0.9 08/09/2010 0220   GFRNONAA >90 06/03/2013 1639   GFRAA >90 06/03/2013 1639   No results found for: CHOL, HDL, LDLCALC, LDLDIRECT, TRIG, CHOLHDL No results found for: HGBA1C Lab Results  Component Value Date   VITAMINB12 604 05/24/2010   Lab Results  Component Value Date   TSH 0.712 05/24/2010    I reviewed images myself and agree with interpretation. -VRP  04/27/14 MRI lumbar spine (without) 1. Postoperative changes from L3-L5 as above without residual spinal stenosis. 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 Improved right neural foraminal narrowing at L4-5. 2. Progressive adjacent segment disease at L2-3 resulting in severe spinal stenosis. 3. Moderate to severe left neural foraminal stenosis at L5-S1, unchanged.     ASSESSMENT AND PLAN  63 y.o. year old male here with progressive tremor in right upper extremity, with other symptoms of parkinsonism including rigidity, bradykinesia, masked facies, gait difficulty, constipation, anxiety and depression. 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 Also over past 9 years, patient has developed evidence of peripheral neuropathy and lumbar radiculopathy/spinal stenosis, in the setting of diabetes. Previously he was diagnosed with CIDP although that apparently that diagnosis has been reversed.  Ddx: suspected parkinson's disease + neuropathy (diabetic) + lumbar spinal stenosis  PLAN: - MRI brain - trial of carbidopa/levodopa  Orders Placed This Encounter  Procedures  . MR Brain Wo Contrast   Meds ordered this encounter  Medications   . carbidopa-levodopa (SINEMET IR) 25-100 MG per tablet    Sig: Take 1 tablet by mouth 3 (three) times daily before meals.    Dispense:  90 tablet    Refill:  6   Return in about 2 months (around 11/22/2014).    Penni Bombard, MD 78/29/5621, 3:08 PM Certified in Neurology, Neurophysiology and Neuroimaging  [facility] [address] [place], [place] [address] [phone]

## 2014-09-21 NOTE — Patient Instructions (Signed)
Try carbidopa/levodopa medication.   I will check MRI brain.

## 2014-10-22 ENCOUNTER — Telehealth: Payer: Self-pay | Admitting: *Deleted

## 2014-10-22 NOTE — Telephone Encounter (Signed)
Left message for pt to call back to office and reschedule his follow up appt from 11/23/14 since Dr. Leta Baptist will not be in the office

## 2014-10-27 ENCOUNTER — Telehealth: Payer: Self-pay | Admitting: *Deleted

## 2014-10-27 NOTE — Telephone Encounter (Signed)
Left a second message asking the pt to call back and reschedule his follow-up appt since Dr. Leta Baptist will not be in the office 11/23/14

## 2014-11-01 ENCOUNTER — Telehealth: Payer: Self-pay | Admitting: *Deleted

## 2014-11-01 NOTE — Telephone Encounter (Signed)
Left message asking pt to call back. Dr. Leta Baptist will not be in the office on 11/23/2014, he needs to reschedule his follow-up. Please get him rescheduled when he calls back

## 2014-11-02 ENCOUNTER — Encounter: Payer: Self-pay | Admitting: *Deleted

## 2014-11-23 ENCOUNTER — Ambulatory Visit: Payer: BC Managed Care – PPO | Admitting: Diagnostic Neuroimaging

## 2015-05-22 ENCOUNTER — Encounter (HOSPITAL_COMMUNITY): Payer: Self-pay

## 2015-05-22 ENCOUNTER — Emergency Department (HOSPITAL_COMMUNITY)
Admission: EM | Admit: 2015-05-22 | Discharge: 2015-05-22 | Disposition: A | Discharge status: Home / Self Care | Payer: BC Managed Care – PPO | Attending: Emergency Medicine | Admitting: Emergency Medicine

## 2015-05-22 DIAGNOSIS — E119 Type 2 diabetes mellitus without complications: Secondary | ICD-10-CM | POA: Insufficient documentation

## 2015-05-22 DIAGNOSIS — S01511A Laceration without foreign body of lip, initial encounter: Secondary | ICD-10-CM

## 2015-05-22 DIAGNOSIS — Y9389 Activity, other specified: Secondary | ICD-10-CM | POA: Insufficient documentation

## 2015-05-22 DIAGNOSIS — I10 Essential (primary) hypertension: Secondary | ICD-10-CM | POA: Diagnosis not present

## 2015-05-22 DIAGNOSIS — E669 Obesity, unspecified: Secondary | ICD-10-CM | POA: Diagnosis not present

## 2015-05-22 DIAGNOSIS — Y998 Other external cause status: Secondary | ICD-10-CM | POA: Diagnosis not present

## 2015-05-22 DIAGNOSIS — Z86018 Personal history of other benign neoplasm: Secondary | ICD-10-CM | POA: Insufficient documentation

## 2015-05-22 DIAGNOSIS — Z872 Personal history of diseases of the skin and subcutaneous tissue: Secondary | ICD-10-CM | POA: Diagnosis not present

## 2015-05-22 DIAGNOSIS — Z8709 Personal history of other diseases of the respiratory system: Secondary | ICD-10-CM | POA: Insufficient documentation

## 2015-05-22 DIAGNOSIS — Z79899 Other long term (current) drug therapy: Secondary | ICD-10-CM | POA: Diagnosis not present

## 2015-05-22 DIAGNOSIS — W2209XA Striking against other stationary object, initial encounter: Secondary | ICD-10-CM | POA: Diagnosis not present

## 2015-05-22 DIAGNOSIS — Z8739 Personal history of other diseases of the musculoskeletal system and connective tissue: Secondary | ICD-10-CM | POA: Diagnosis not present

## 2015-05-22 DIAGNOSIS — S0993XA Unspecified injury of face, initial encounter: Secondary | ICD-10-CM | POA: Diagnosis present

## 2015-05-22 DIAGNOSIS — Z8669 Personal history of other diseases of the nervous system and sense organs: Secondary | ICD-10-CM | POA: Insufficient documentation

## 2015-05-22 DIAGNOSIS — Y9289 Other specified places as the place of occurrence of the external cause: Secondary | ICD-10-CM | POA: Insufficient documentation

## 2015-05-22 DIAGNOSIS — S80211A Abrasion, right knee, initial encounter: Secondary | ICD-10-CM | POA: Insufficient documentation

## 2015-05-22 DIAGNOSIS — K219 Gastro-esophageal reflux disease without esophagitis: Secondary | ICD-10-CM | POA: Insufficient documentation

## 2015-05-22 DIAGNOSIS — Z720 Tobacco use: Secondary | ICD-10-CM | POA: Diagnosis not present

## 2015-05-22 DIAGNOSIS — N401 Enlarged prostate with lower urinary tract symptoms: Secondary | ICD-10-CM | POA: Insufficient documentation

## 2015-05-22 DIAGNOSIS — T07XXXA Unspecified multiple injuries, initial encounter: Secondary | ICD-10-CM

## 2015-05-22 MED ORDER — TETANUS-DIPHTH-ACELL PERTUSSIS 5-2.5-18.5 LF-MCG/0.5 IM SUSP
0.5000 mL | Freq: Once | INTRAMUSCULAR | Status: AC
  Administered 2015-05-22: 0.5 mL via INTRAMUSCULAR
  Filled 2015-05-22: qty 0.5

## 2015-05-22 MED ORDER — LIDOCAINE HCL (PF) 1 % IJ SOLN
2.0000 mL | Freq: Once | INTRAMUSCULAR | Status: AC
  Administered 2015-05-22: 2 mL
  Filled 2015-05-22: qty 5

## 2015-05-22 MED ORDER — BUPIVACAINE HCL 0.5 % IJ SOLN
50.0000 mL | Freq: Once | INTRAMUSCULAR | Status: AC
  Administered 2015-05-22: 50 mL
  Filled 2015-05-22: qty 50

## 2015-05-22 NOTE — ED Provider Notes (Signed)
CSN: [id number]     Arrival date & time 05/22/15  0346 History   First MD Initiated Contact with Patient 05/22/15 [phone]     Chief Complaint  Patient presents with  . Lip Laceration     (Consider location/radiation/quality/duration/timing/severity/associated sxs/prior Treatment) HPI Comments: Patient states he rolled out of bed and hit his face on a now has a laceration to his upper lip on the left side.  Denies any loose teeth or neck pain.  He also sustained an abrasion to his right forearm and bilateral knees  The history is provided by the patient.    Past Medical History  Diagnosis Date  . DM (diabetes mellitus)   . Benign neoplasm of colon   . Abnormal involuntary movements(781.0)   . Cough   . Pain in limb   . Restless legs syndrome (RLS)   . Obesity, unspecified   . Unspecified local infection of skin and subcutaneous tissue   . Blisters with epidermal loss due to burn (second degree) of foot   . Tobacco use disorder   . Hypertrophy of prostate with urinary obstruction and other lower urinary tract symptoms (LUTS)   . Spinal stenosis, unspecified region other than cervical   . Loss of weight   . Acute bronchitis   . Allergic rhinitis due to pollen   . Degeneration of lumbar or lumbosacral intervertebral disc   . Depressive disorder, not elsewhere classified   . Diverticulosis of colon (without mention of hemorrhage)   . Esophageal reflux   . Unspecified essential hypertension   . Edema   . Unspecified disease of pericardium   . Varices of other sites   . Sleep disturbance 03/24/2014   Past Surgical History  Procedure Laterality Date  . Saphenous vein graft resection Right 1991  . Spine surgery  11/2010 & 12/2010   Family History  Problem Relation Age of Onset  . Cancer Mother   . Heart failure Father   . Tuberculosis Maternal Uncle   . Cancer - Colon Maternal Uncle   . Hypertension Paternal Grandfather    Social History  Substance Use Topics  . Smoking  status: Current Every Day Smoker    Types: Pipe  . Smokeless tobacco: None     Comment: moderate  . Alcohol Use: No    Review of Systems  HENT: Negative for dental problem.   Respiratory: Negative for cough.   Gastrointestinal: Negative for nausea.  Musculoskeletal: Negative for neck pain.  Skin: Positive for wound.  Neurological: Negative for dizziness, numbness and headaches.  All other systems reviewed and are negative.     Allergies  Avelox  Home Medications   Prior to Admission medications   Medication Sig Start Date End Date Taking? Authorizing Provider  DULoxetine (CYMBALTA) 60 MG capsule Take 120 mg by mouth daily.    Yes Historical Provider, MD  dutasteride (AVODART) 0.5 MG capsule Take 0.5 mg by mouth daily.   Yes Historical Provider, MD  HYDROcodone-acetaminophen (NORCO/VICODIN) 5-325 MG per tablet Take 1 tablet by mouth every 6 (six) hours as needed for moderate pain.  03/08/14  Yes Historical Provider, MD  loratadine (CLARITIN) 10 MG tablet Take 10 mg by mouth daily.   Yes Historical Provider, MD  metFORMIN (GLUCOPHAGE) 500 MG tablet Take 500 mg by mouth daily with breakfast.   Yes Historical Provider, MD  metoprolol succinate (TOPROL-XL) 50 MG 24 hr tablet Take 25 tablets by mouth daily.  02/22/14  Yes Historical Provider, MD  Multiple Vitamin (  MULTIVITAMIN WITH MINERALS) TABS tablet Take 1 tablet by mouth daily.   Yes Historical Provider, MD  omeprazole (PRILOSEC) 20 MG capsule Take 20 mg by mouth 2 (two) times daily before a meal.    Yes Historical Provider, MD  oxymorphone (OPANA) 10 MG tablet Take 10 mg by mouth every 12 (twelve) hours as needed for pain.   Yes Historical Provider, MD  pregabalin (LYRICA) 75 MG capsule Take 75 mg by mouth 2 (two) times daily.   Yes Historical Provider, MD  PROAIR HFA 108 (90 BASE) MCG/ACT inhaler Inhale 2 puffs into the lungs every 4 (four) hours as needed for wheezing.  07/30/14  Yes Historical Provider, MD  rOPINIRole (REQUIP) 1  MG tablet Take 1 mg by mouth at bedtime.   Yes Historical Provider, MD  tamsulosin (FLOMAX) 0.4 MG CAPS capsule Take 0.4 mg by mouth daily.   Yes Historical Provider, MD  telmisartan-hydrochlorothiazide (MICARDIS HCT) 80-12.5 MG per tablet Take 1 tablet by mouth daily.   Yes Historical Provider, MD  carbidopa-levodopa (SINEMET IR) 25-100 MG per tablet Take 1 tablet by mouth 3 (three) times daily before meals. Patient not taking: Reported on 05/22/2015 09/21/14   Penni Bombard, MD  ibuprofen (ADVIL,MOTRIN) 800 MG tablet Take 1 tablet (800 mg total) by mouth every 8 (eight) hours as needed for pain. Patient not taking: Reported on 05/22/2015 06/03/13   Dalia Heading, PA-C   BP 150/74 mmHg  Pulse 87  Temp(Src) 97.8 F (36.6 C) (Oral)  Resp 18  SpO2 97% Physical Exam  Constitutional: He appears well-developed and well-nourished.  HENT:  Head: Normocephalic.  Mouth/Throat: Oropharynx is clear and moist.    Eyes: Pupils are equal, round, and reactive to light.  Neck: Normal range of motion.  Cardiovascular: Normal rate and regular rhythm.   Pulmonary/Chest: Effort normal.  Abdominal: Soft.  Musculoskeletal: He exhibits no edema or tenderness.       Arms:      Legs: Neurological: He is alert.  Skin: Skin is warm.  Nursing note and vitals reviewed.   ED Course  LACERATION REPAIR Date/Time: 05/22/2015 5:01 AM Performed by: Junius Creamer Authorized by: Junius Creamer Consent: Verbal consent obtained. Written consent obtained. Risks and benefits: risks, benefits and alternatives were discussed Consent given by: patient Patient understanding: patient states understanding of the procedure being performed Patient identity confirmed: verbally with patient Body area: head/neck Location details: upper lip Full thickness lip laceration: yes [place] border involved: no Lip laceration height: more than half vertical height Laceration length: 0.5 cm Foreign bodies: no foreign  bodies Tendon involvement: none Nerve involvement: none Vascular damage: no Anesthesia: nerve block Local anesthetic: bupivacaine 0.5% without epinephrine and lidocaine 1% without epinephrine Anesthetic total: 2 ml Patient sedated: no Preparation: Patient was prepped and draped in the usual sterile fashion. Irrigation solution: saline Amount of cleaning: standard Debridement: none Degree of undermining: none Subcutaneous closure: 5-0 Vicryl Number of sutures: 5 Technique: simple Approximation: close Approximation difficulty: simple   (including critical care time) Labs Review Labs Reviewed - No data to display  Imaging Review No results found. I have personally reviewed and evaluated these images and lab results as part of my medical decision-making.   EKG Interpretation None      MDM   Final diagnoses:  Lip laceration, initial encounter  Abrasions of multiple sites         Junius Creamer, NP 05/22/15 0505  Linton Flemings, MD 05/22/15 [phone]

## 2015-05-22 NOTE — ED Notes (Signed)
Pt woke up and hit his face on the night stand, he had a usted lip, bleeding controlled, an abrasion on his right arm and an abrasion on his left knee

## 2015-05-22 NOTE — Discharge Instructions (Signed)
Abrasion An abrasion is a cut or scrape of the skin. Abrasions do not extend through all layers of the skin and most heal within 10 days. It is important to care for your abrasion properly to prevent infection. CAUSES  Most abrasions are caused by falling on, or gliding across, the ground or other surface. When your skin rubs on something, the outer and inner layer of skin rubs off, causing an abrasion. DIAGNOSIS  Your caregiver will be able to diagnose an abrasion during a physical exam.  TREATMENT  Your treatment depends on how large and deep the abrasion is. Generally, your abrasion will be cleaned with water and a mild soap to remove any dirt or debris. An antibiotic ointment may be put over the abrasion to prevent an infection. A bandage (dressing) may be wrapped around the abrasion to keep it from getting dirty.  You may need a tetanus shot if:  You cannot remember when you had your last tetanus shot.  You have never had a tetanus shot.  The injury broke your skin. If you get a tetanus shot, your arm may swell, get red, and feel warm to the touch. This is common and not a problem. If you need a tetanus shot and you choose not to have one, there is a rare chance of getting tetanus. Sickness from tetanus can be serious.  HOME CARE INSTRUCTIONS   If a dressing was applied, change it at least once a day or as directed by your caregiver. If the bandage sticks, soak it off with warm water.   Wash the area with water and a mild soap to remove all the ointment 2 times a day. Rinse off the soap and pat the area dry with a clean towel.   Reapply any ointment as directed by your caregiver. This will help prevent infection and keep the bandage from sticking. Use gauze over the wound and under the dressing to help keep the bandage from sticking.   Change your dressing right away if it becomes wet or dirty.   Only take over-the-counter or prescription medicines for pain, discomfort, or fever as  directed by your caregiver.   Follow up with your caregiver within 24-48 hours for a wound check, or as directed. If you were not given a wound-check appointment, look closely at your abrasion for redness, swelling, or pus. These are signs of infection. SEEK IMMEDIATE MEDICAL CARE IF:   You have increasing pain in the wound.   You have redness, swelling, or tenderness around the wound.   You have pus coming from the wound.   You have a fever or persistent symptoms for more than 2-3 days.  You have a fever and your symptoms suddenly get worse.  You have a bad smell coming from the wound or dressing.  MAKE SURE YOU:   Understand these instructions.  Will watch your condition.  Will get help right away if you are not doing well or get worse. Document Released: 06/20/2005 Document Revised: 08/27/2012 Document Reviewed: 08/14/2011 [facility] Patient Information 2015 [place], [place]. This information is not intended to replace advice given to you by your health care provider. Make sure you discuss any questions you have with your health care provider.  Absorbable Suture Repair Absorbable sutures (stitches) hold skin together so you can heal. Keep skin wounds clean and dry for the next 2 to 3 days. Then, you may gently wash your wound and dress it with an antibiotic ointment as recommended. As your wound  begins to heal, the sutures are no longer needed, and they typically begin to fall off. This will take 7 to 10 days. After 10 days, if your sutures are loose, you can remove them by wiping with a clean gauze pad or a cotton ball. Do not pull your sutures out. They should wipe away easily. If after 10 days they do not easily wipe away, have your caregiver take them out. Absorbable sutures may be used deep in a wound to help hold it together. If these stitches are below the skin, the body will absorb them completely in 3 to 4 weeks.  You may need a tetanus shot if:  You cannot remember when  you had your last tetanus shot.  You have never had a tetanus shot. If you get a tetanus shot, your arm may swell, get red, and feel warm to the touch. This is common and not a problem. If you need a tetanus shot and you choose not to have one, there is a rare chance of getting tetanus. Sickness from tetanus can be serious. SEEK IMMEDIATE MEDICAL CARE IF:  You have redness in the wound area.  The wound area feels hot to the touch.  You develop swelling in the wound area.  You develop pain.  There is fluid drainage from the wound. Document Released: 10/18/2004 Document Revised: 12/03/2011 Document Reviewed: 01/30/2011 [facility] Patient Information 2015 [place], [place]. This information is not intended to replace advice given to you by your health care provider. Make sure you discuss any questions you have with your health care provider.

## 2015-08-10 ENCOUNTER — Other Ambulatory Visit (HOSPITAL_COMMUNITY): Payer: Self-pay | Admitting: Neurological Surgery

## 2015-08-30 ENCOUNTER — Ambulatory Visit (HOSPITAL_COMMUNITY)
Admission: RE | Admit: 2015-08-30 | Discharge: 2015-08-30 | Disposition: A | Discharge status: Home / Self Care | Payer: Worker's Compensation | Source: Ambulatory Visit | Attending: Neurological Surgery | Admitting: Neurological Surgery

## 2015-08-30 ENCOUNTER — Encounter (HOSPITAL_COMMUNITY)
Admission: RE | Admit: 2015-08-30 | Discharge: 2015-08-30 | Disposition: A | Discharge status: Home / Self Care | Payer: Worker's Compensation | Source: Ambulatory Visit | Attending: Neurological Surgery | Admitting: Neurological Surgery

## 2015-08-30 ENCOUNTER — Encounter (HOSPITAL_COMMUNITY): Payer: Self-pay

## 2015-08-30 DIAGNOSIS — Z01818 Encounter for other preprocedural examination: Secondary | ICD-10-CM | POA: Insufficient documentation

## 2015-08-30 DIAGNOSIS — M48061 Spinal stenosis, lumbar region without neurogenic claudication: Secondary | ICD-10-CM

## 2015-08-30 DIAGNOSIS — Z01812 Encounter for preprocedural laboratory examination: Secondary | ICD-10-CM | POA: Insufficient documentation

## 2015-08-30 DIAGNOSIS — I10 Essential (primary) hypertension: Secondary | ICD-10-CM | POA: Diagnosis not present

## 2015-08-30 DIAGNOSIS — Z01810 Encounter for preprocedural cardiovascular examination: Secondary | ICD-10-CM | POA: Insufficient documentation

## 2015-08-30 DIAGNOSIS — E119 Type 2 diabetes mellitus without complications: Secondary | ICD-10-CM | POA: Insufficient documentation

## 2015-08-30 DIAGNOSIS — M4806 Spinal stenosis, lumbar region: Secondary | ICD-10-CM | POA: Insufficient documentation

## 2015-08-30 HISTORY — DX: Other complications of anesthesia, initial encounter: T88.59XA

## 2015-08-30 HISTORY — DX: Adverse effect of unspecified anesthetic, initial encounter: T41.45XA

## 2015-08-30 HISTORY — DX: Drug induced constipation: K59.03

## 2015-08-30 LAB — CBC WITH DIFFERENTIAL/PLATELET
Basophils Absolute: 0.1 10*3/uL (ref 0.0–0.1)
Basophils Relative: 1 %
EOS ABS: 0.6 10*3/uL (ref 0.0–0.7)
EOS PCT: 8 %
HCT: 41 % (ref 39.0–52.0)
Hemoglobin: 13.9 g/dL (ref 13.0–17.0)
LYMPHS ABS: 2.4 10*3/uL (ref 0.7–4.0)
Lymphocytes Relative: 35 %
MCH: 31.1 pg (ref 26.0–34.0)
MCHC: 33.9 g/dL (ref 30.0–36.0)
MCV: 91.7 fL (ref 78.0–100.0)
MONO ABS: 0.6 10*3/uL (ref 0.1–1.0)
Monocytes Relative: 9 %
NEUTROS PCT: 47 %
Neutro Abs: 3.2 10*3/uL (ref 1.7–7.7)
PLATELETS: ADEQUATE 10*3/uL (ref 150–400)
RBC: 4.47 MIL/uL (ref 4.22–5.81)
RDW: 13.3 % (ref 11.5–15.5)
WBC: 6.9 10*3/uL (ref 4.0–10.5)

## 2015-08-30 LAB — SURGICAL PCR SCREEN
MRSA, PCR: NEGATIVE
STAPHYLOCOCCUS AUREUS: NEGATIVE

## 2015-08-30 LAB — BASIC METABOLIC PANEL
Anion gap: 11 (ref 5–15)
BUN: 18 mg/dL (ref 6–20)
CALCIUM: 8.7 mg/dL — AB (ref 8.9–10.3)
CHLORIDE: 100 mmol/L — AB (ref 101–111)
CO2: 25 mmol/L (ref 22–32)
CREATININE: 0.88 mg/dL (ref 0.61–1.24)
GFR calc non Af Amer: >60 mL/min (ref >60)
Glucose, Bld: 107 mg/dL — ABNORMAL HIGH (ref 65–99)
Potassium: 4.1 mmol/L (ref 3.5–5.1)
SODIUM: 136 mmol/L (ref 135–145)

## 2015-08-30 LAB — PROTIME-INR
INR: 1.18 (ref 0.00–1.49)
PROTHROMBIN TIME: 15.2 s (ref 11.6–15.2)

## 2015-08-30 LAB — GLUCOSE, CAPILLARY: Glucose-Capillary: 103 mg/dL — ABNORMAL HIGH (ref 65–99)

## 2015-08-30 NOTE — Progress Notes (Signed)
   08/30/15 1519  OBSTRUCTIVE SLEEP APNEA  Have you ever been diagnosed with sleep apnea through a sleep study? No  Do you snore loudly (loud enough to be heard through closed doors)?  1  Do you often feel tired, fatigued, or sleepy during the daytime (such as falling asleep during driving or talking to someone)? 0  Has anyone observed you stop breathing during your sleep? 0  Do you have, or are you being treated for high blood pressure? 1  BMI more than 35 kg/m2? 0  Age > 50 (1-yes) 1  Neck circumference greater than:Male 16 inches or larger, Male 17inches or larger? 1  Male Gender (Yes=1) 1  Obstructive Sleep Apnea Score 5  Score 5 or greater  Results sent to PCP   This patient has screened at risk for sleep apnea using the STOP Bang tool used during a pre-surgical visit. A score of 5 or greater is at risk for sleep apnea.

## 2015-08-30 NOTE — Pre-Procedure Instructions (Signed)
Douglas Price  08/30/2015     Your procedure is scheduled on : Thursday September 08, 2015.  Report to [facility] Admitting at 11:30 AM.  Call this number if you have problems the morning of surgery: [phone]    Remember:  Do not eat food or drink liquids after midnight.  Take these medicines the morning of surgery with A SIP OF WATER : Duloxetine (Cymbalta), Hydrocodone if needed, Loratadine (Claritin), Metoprolol (Toprol XL), Omeprazole (Prilosec), Oxymorphone if needed, Pregabalin (Lyrica), Proair inhaler if needed (Bring inhaler with you), Tamsulosin (Flomax)   Stop taking any vitamins, herbal medications, Ibuprofen, Advil, Motrin, Aleve, etc on Thursday December 8th   Do  NOT take any diabetic medications the morning of your surgery (NO Metformin/Glucophage)  How to Manage Your Diabetes Before Surgery   Why is it important to control my blood sugar before and after surgery?   Improving blood sugar levels before and after surgery helps healing and can limit problems.  A way of improving blood sugar control is eating a healthy diet by:  - Eating less sugar and carbohydrates  - Increasing activity/exercise  - Talk with your doctor about reaching your blood sugar goals  High blood sugars (greater than 180 mg/dL) can raise your risk of infections and slow down your recovery so you will need to focus on controlling your diabetes during the weeks before surgery.  Make sure that the doctor who takes care of your diabetes knows about your planned surgery including the date and location.  How do I manage my blood sugars before surgery?   Check your blood sugar at least 4 times a day, 2 days before surgery to make sure that they are not too high or low.   Check your blood sugar the morning of your surgery when you wake up and every 2 hours until you get to the Short-Stay unit.  If your blood sugar is less than 70 mg/dL, you will need to treat for low blood sugar  by:  Treat a low blood sugar (less than 70 mg/dL) with 1/2 cup of clear juice (cranberry or apple), 4 glucose tablets, OR glucose gel.  Recheck blood sugar in 15 minutes after treatment (to make sure it is greater than 70 mg/dL).  If blood sugar is not greater than 70 mg/dL on re-check, call [phone] for further instructions.   Report your blood sugar to the Short-Stay nurse when you get to Short-Stay.  References:  [facility], 2007 "How to Manage your Diabetes Before and After Surgery".  What do I do about my diabetes medications?   Do not take oral diabetes medicines (pills) the morning of surgery.   Do not wear jewelry.  Do not wear lotions, powders, or cologne.    Men may shave face and neck.  Do not bring valuables to the hospital.  [facility] is not responsible for any belongings or valuables.  Contacts, dentures or bridgework may not be worn into surgery.  Leave your suitcase in the car.  After surgery it may be brought to your room.  For patients admitted to the hospital, discharge time will be determined by your treatment team.  Patients discharged the day of surgery will not be allowed to drive home.   Name and phone number of your driver:    Special instructions:  Shower using CHG soap the night before and the morning of your surgery  Please read over the following fact sheets that you  were given. Pain Booklet, Coughing and Deep Breathing, MRSA Information and Surgical Site Infection Prevention

## 2015-08-30 NOTE — Progress Notes (Signed)
PCP is [name] [name]  Patient denied having any acute cardiac or pulmonary issues  Nurse inquired about fasting blood glucose and patient stated that he does "not check his blood sugar at home."

## 2015-08-30 NOTE — Progress Notes (Signed)
Shelia from lab called and informed Nurse that they did not receive enough blood in CBC tube to run a A1C, therefore A1C would need to be drawn DOS. Will re-enter orders and have A1C drawn the DOS.

## 2015-09-08 ENCOUNTER — Ambulatory Visit (HOSPITAL_COMMUNITY): Payer: Worker's Compensation | Admitting: Anesthesiology

## 2015-09-08 ENCOUNTER — Ambulatory Visit (HOSPITAL_COMMUNITY): Payer: Worker's Compensation

## 2015-09-08 ENCOUNTER — Ambulatory Visit (HOSPITAL_COMMUNITY)
Admission: RE | Admit: 2015-09-08 | Discharge: 2015-09-09 | Disposition: A | Discharge status: Home / Self Care | Payer: Worker's Compensation | Source: Ambulatory Visit | Attending: Neurological Surgery | Admitting: Neurological Surgery

## 2015-09-08 ENCOUNTER — Encounter (HOSPITAL_COMMUNITY)
Admission: RE | Disposition: A | Discharge status: Home / Self Care | Payer: Self-pay | Source: Ambulatory Visit | Attending: Neurological Surgery

## 2015-09-08 ENCOUNTER — Encounter (HOSPITAL_COMMUNITY): Payer: Self-pay | Admitting: *Deleted

## 2015-09-08 DIAGNOSIS — M1990 Unspecified osteoarthritis, unspecified site: Secondary | ICD-10-CM | POA: Diagnosis not present

## 2015-09-08 DIAGNOSIS — Z7984 Long term (current) use of oral hypoglycemic drugs: Secondary | ICD-10-CM | POA: Diagnosis not present

## 2015-09-08 DIAGNOSIS — F329 Major depressive disorder, single episode, unspecified: Secondary | ICD-10-CM | POA: Insufficient documentation

## 2015-09-08 DIAGNOSIS — K219 Gastro-esophageal reflux disease without esophagitis: Secondary | ICD-10-CM | POA: Insufficient documentation

## 2015-09-08 DIAGNOSIS — I10 Essential (primary) hypertension: Secondary | ICD-10-CM | POA: Insufficient documentation

## 2015-09-08 DIAGNOSIS — Z419 Encounter for procedure for purposes other than remedying health state, unspecified: Secondary | ICD-10-CM

## 2015-09-08 DIAGNOSIS — Z888 Allergy status to other drugs, medicaments and biological substances status: Secondary | ICD-10-CM | POA: Insufficient documentation

## 2015-09-08 DIAGNOSIS — Z9889 Other specified postprocedural states: Secondary | ICD-10-CM

## 2015-09-08 DIAGNOSIS — E119 Type 2 diabetes mellitus without complications: Secondary | ICD-10-CM | POA: Insufficient documentation

## 2015-09-08 DIAGNOSIS — M4806 Spinal stenosis, lumbar region: Secondary | ICD-10-CM | POA: Diagnosis not present

## 2015-09-08 DIAGNOSIS — F17210 Nicotine dependence, cigarettes, uncomplicated: Secondary | ICD-10-CM | POA: Diagnosis not present

## 2015-09-08 HISTORY — PX: LUMBAR LAMINECTOMY/DECOMPRESSION MICRODISCECTOMY: SHX5026

## 2015-09-08 LAB — GLUCOSE, CAPILLARY
Glucose-Capillary: 115 mg/dL — ABNORMAL HIGH (ref 65–99)
Glucose-Capillary: 106 mg/dL — ABNORMAL HIGH (ref 65–99)
Glucose-Capillary: 144 mg/dL — ABNORMAL HIGH (ref 65–99)

## 2015-09-08 SURGERY — LUMBAR LAMINECTOMY/DECOMPRESSION MICRODISCECTOMY 1 LEVEL
Anesthesia: General | Site: Back | Laterality: Bilateral

## 2015-09-08 MED ORDER — ALBUTEROL SULFATE HFA 108 (90 BASE) MCG/ACT IN AERS: 2.0000 | INHALATION_SPRAY | RESPIRATORY_TRACT | Status: DC | PRN

## 2015-09-08 MED ORDER — ONDANSETRON HCL 4 MG/2ML IJ SOLN: 4.0000 mg | INTRAMUSCULAR | Status: DC | PRN

## 2015-09-08 MED ORDER — LACTATED RINGERS IV SOLN
INTRAVENOUS | Status: DC | PRN
  Administered 2015-09-08 (×2): via INTRAVENOUS

## 2015-09-08 MED ORDER — 0.9 % SODIUM CHLORIDE (POUR BTL) OPTIME
TOPICAL | Status: DC | PRN
  Administered 2015-09-08: 1000 mL

## 2015-09-08 MED ORDER — FENTANYL CITRATE (PF) 250 MCG/5ML IJ SOLN
INTRAMUSCULAR | Status: AC
  Filled 2015-09-08: qty 5

## 2015-09-08 MED ORDER — PROPOFOL 10 MG/ML IV BOLUS
INTRAVENOUS | Status: AC
  Filled 2015-09-08: qty 20

## 2015-09-08 MED ORDER — METFORMIN HCL 500 MG PO TABS
500.0000 mg | ORAL_TABLET | Freq: Every day | ORAL | Status: DC
Start: 2015-09-09 — End: 2015-09-09
  Administered 2015-09-09: 500 mg via ORAL
  Filled 2015-09-08: qty 1

## 2015-09-08 MED ORDER — LACTATED RINGERS IV SOLN
INTRAVENOUS | Status: DC
  Administered 2015-09-08: 12:00:00 via INTRAVENOUS

## 2015-09-08 MED ORDER — POTASSIUM CHLORIDE IN NACL 20-0.9 MEQ/L-% IV SOLN
INTRAVENOUS | Status: DC
  Filled 2015-09-08 (×4): qty 1000

## 2015-09-08 MED ORDER — SODIUM CHLORIDE 0.9 % IJ SOLN: 3.0000 mL | INTRAMUSCULAR | Status: DC | PRN

## 2015-09-08 MED ORDER — DEXAMETHASONE SODIUM PHOSPHATE 10 MG/ML IJ SOLN
10.0000 mg | INTRAMUSCULAR | Status: DC
  Filled 2015-09-08: qty 1

## 2015-09-08 MED ORDER — SODIUM CHLORIDE 0.9 % IJ SOLN
3.0000 mL | Freq: Two times a day (BID) | INTRAMUSCULAR | Status: DC
  Administered 2015-09-08: 3 mL via INTRAVENOUS

## 2015-09-08 MED ORDER — ACETAMINOPHEN 325 MG PO TABS: 650.0000 mg | ORAL_TABLET | ORAL | Status: DC | PRN

## 2015-09-08 MED ORDER — HYDROMORPHONE HCL 1 MG/ML IJ SOLN
INTRAMUSCULAR | Status: AC
  Filled 2015-09-08: qty 1

## 2015-09-08 MED ORDER — LIDOCAINE HCL (CARDIAC) 20 MG/ML IV SOLN
INTRAVENOUS | Status: DC | PRN
  Administered 2015-09-08: 60 mg via INTRAVENOUS

## 2015-09-08 MED ORDER — TELMISARTAN-HCTZ 80-12.5 MG PO TABS: 1.0000 | ORAL_TABLET | Freq: Every day | ORAL | Status: DC

## 2015-09-08 MED ORDER — ONDANSETRON HCL 4 MG/2ML IJ SOLN
INTRAMUSCULAR | Status: DC | PRN
  Administered 2015-09-08: 4 mg via INTRAVENOUS

## 2015-09-08 MED ORDER — PREGABALIN 75 MG PO CAPS
75.0000 mg | ORAL_CAPSULE | Freq: Two times a day (BID) | ORAL | Status: DC
  Administered 2015-09-08 – 2015-09-09 (×2): 75 mg via ORAL
  Filled 2015-09-08 (×2): qty 1

## 2015-09-08 MED ORDER — CEFAZOLIN SODIUM 1-5 GM-% IV SOLN
1.0000 g | Freq: Three times a day (TID) | INTRAVENOUS | Status: AC
  Administered 2015-09-08 – 2015-09-09 (×2): 1 g via INTRAVENOUS
  Filled 2015-09-08 (×2): qty 50

## 2015-09-08 MED ORDER — GELATIN ABSORBABLE MT POWD
OROMUCOSAL | Status: DC | PRN
  Administered 2015-09-08: 13:00:00 via TOPICAL

## 2015-09-08 MED ORDER — PROMETHAZINE HCL 25 MG/ML IJ SOLN: 6.2500 mg | INTRAMUSCULAR | Status: DC | PRN

## 2015-09-08 MED ORDER — NEOSTIGMINE METHYLSULFATE 10 MG/10ML IV SOLN
INTRAVENOUS | Status: DC | PRN
  Administered 2015-09-08: 5 mg via INTRAVENOUS

## 2015-09-08 MED ORDER — HYDROCODONE-ACETAMINOPHEN 5-325 MG PO TABS
1.0000 | ORAL_TABLET | ORAL | Status: DC | PRN
  Administered 2015-09-08 – 2015-09-09 (×5): 2 via ORAL
  Filled 2015-09-08 (×4): qty 2

## 2015-09-08 MED ORDER — SENNA 8.6 MG PO TABS
1.0000 | ORAL_TABLET | Freq: Two times a day (BID) | ORAL | Status: DC
  Administered 2015-09-08 – 2015-09-09 (×2): 8.6 mg via ORAL
  Filled 2015-09-08 (×2): qty 1

## 2015-09-08 MED ORDER — PHENOL 1.4 % MT LIQD: 1.0000 | OROMUCOSAL | Status: DC | PRN

## 2015-09-08 MED ORDER — BUPIVACAINE HCL (PF) 0.25 % IJ SOLN
INTRAMUSCULAR | Status: DC | PRN
  Administered 2015-09-08: 2 mL

## 2015-09-08 MED ORDER — METOPROLOL SUCCINATE ER 25 MG PO TB24
50.0000 mg | ORAL_TABLET | Freq: Every day | ORAL | Status: DC
  Administered 2015-09-09: 50 mg via ORAL
  Filled 2015-09-08: qty 2

## 2015-09-08 MED ORDER — EPHEDRINE SULFATE 50 MG/ML IJ SOLN
INTRAMUSCULAR | Status: DC | PRN
  Administered 2015-09-08 (×6): 5 mg via INTRAVENOUS

## 2015-09-08 MED ORDER — DULOXETINE HCL 30 MG PO CPEP
120.0000 mg | ORAL_CAPSULE | Freq: Every day | ORAL | Status: DC
  Administered 2015-09-09: 120 mg via ORAL
  Filled 2015-09-08: qty 4

## 2015-09-08 MED ORDER — IRBESARTAN 300 MG PO TABS
300.0000 mg | ORAL_TABLET | Freq: Every day | ORAL | Status: DC
  Administered 2015-09-09: 300 mg via ORAL
  Filled 2015-09-08: qty 1

## 2015-09-08 MED ORDER — ACETAMINOPHEN 650 MG RE SUPP: 650.0000 mg | RECTAL | Status: DC | PRN

## 2015-09-08 MED ORDER — PHENYLEPHRINE HCL 10 MG/ML IJ SOLN
INTRAMUSCULAR | Status: DC | PRN
  Administered 2015-09-08: 80 ug via INTRAVENOUS
  Administered 2015-09-08 (×3): 40 ug via INTRAVENOUS

## 2015-09-08 MED ORDER — FENTANYL CITRATE (PF) 100 MCG/2ML IJ SOLN
INTRAMUSCULAR | Status: DC | PRN
  Administered 2015-09-08: 100 ug via INTRAVENOUS

## 2015-09-08 MED ORDER — MEPERIDINE HCL 25 MG/ML IJ SOLN: 6.2500 mg | INTRAMUSCULAR | Status: DC | PRN

## 2015-09-08 MED ORDER — MIDAZOLAM HCL 5 MG/5ML IJ SOLN
INTRAMUSCULAR | Status: DC | PRN
  Administered 2015-09-08: 2 mg via INTRAVENOUS

## 2015-09-08 MED ORDER — SODIUM CHLORIDE 0.9 % IR SOLN
Status: DC | PRN
  Administered 2015-09-08: 13:00:00

## 2015-09-08 MED ORDER — SODIUM CHLORIDE 0.9 % IV SOLN: 250.0000 mL | INTRAVENOUS | Status: DC

## 2015-09-08 MED ORDER — DEXAMETHASONE SODIUM PHOSPHATE 4 MG/ML IJ SOLN
INTRAMUSCULAR | Status: DC | PRN
  Administered 2015-09-08: 4 mg via INTRAVENOUS

## 2015-09-08 MED ORDER — GLYCOPYRROLATE 0.2 MG/ML IJ SOLN
INTRAMUSCULAR | Status: DC | PRN
  Administered 2015-09-08: .8 mg via INTRAVENOUS

## 2015-09-08 MED ORDER — MORPHINE SULFATE (PF) 2 MG/ML IV SOLN: 1.0000 mg | INTRAVENOUS | Status: DC | PRN

## 2015-09-08 MED ORDER — ROPINIROLE HCL 1 MG PO TABS
1.0000 mg | ORAL_TABLET | Freq: Every day | ORAL | Status: DC
  Administered 2015-09-08: 1 mg via ORAL
  Filled 2015-09-08: qty 1

## 2015-09-08 MED ORDER — PROPOFOL 10 MG/ML IV BOLUS
INTRAVENOUS | Status: DC | PRN
  Administered 2015-09-08: 200 mg via INTRAVENOUS

## 2015-09-08 MED ORDER — THROMBIN 5000 UNITS EX SOLR
CUTANEOUS | Status: DC | PRN
  Administered 2015-09-08 (×2): 5000 [IU] via TOPICAL

## 2015-09-08 MED ORDER — MIDAZOLAM HCL 2 MG/2ML IJ SOLN
INTRAMUSCULAR | Status: AC
  Filled 2015-09-08: qty 2

## 2015-09-08 MED ORDER — TAMSULOSIN HCL 0.4 MG PO CAPS
0.4000 mg | ORAL_CAPSULE | Freq: Every day | ORAL | Status: DC
  Administered 2015-09-08 – 2015-09-09 (×2): 0.4 mg via ORAL
  Filled 2015-09-08 (×2): qty 1

## 2015-09-08 MED ORDER — HYDROCHLOROTHIAZIDE 12.5 MG PO CAPS
12.5000 mg | ORAL_CAPSULE | Freq: Every day | ORAL | Status: DC
  Administered 2015-09-09: 12.5 mg via ORAL
  Filled 2015-09-08: qty 1

## 2015-09-08 MED ORDER — DUTASTERIDE 0.5 MG PO CAPS
0.5000 mg | ORAL_CAPSULE | Freq: Every day | ORAL | Status: DC
  Administered 2015-09-08 – 2015-09-09 (×2): 0.5 mg via ORAL
  Filled 2015-09-08 (×2): qty 1

## 2015-09-08 MED ORDER — DEXTROSE 5 % IV SOLN
10.0000 mg | INTRAVENOUS | Status: DC | PRN
  Administered 2015-09-08: 15 ug/min via INTRAVENOUS

## 2015-09-08 MED ORDER — CEFAZOLIN SODIUM-DEXTROSE 2-3 GM-% IV SOLR
2.0000 g | INTRAVENOUS | Status: AC
  Administered 2015-09-08: 2 g via INTRAVENOUS
  Filled 2015-09-08: qty 50

## 2015-09-08 MED ORDER — HYDROCODONE-ACETAMINOPHEN 5-325 MG PO TABS
ORAL_TABLET | ORAL | Status: AC
  Administered 2015-09-08: 19:00:00
  Filled 2015-09-08: qty 2

## 2015-09-08 MED ORDER — CYCLOBENZAPRINE HCL 10 MG PO TABS
10.0000 mg | ORAL_TABLET | Freq: Three times a day (TID) | ORAL | Status: DC | PRN
  Administered 2015-09-08 – 2015-09-09 (×2): 10 mg via ORAL
  Filled 2015-09-08 (×2): qty 1

## 2015-09-08 MED ORDER — ROCURONIUM BROMIDE 100 MG/10ML IV SOLN
INTRAVENOUS | Status: DC | PRN
  Administered 2015-09-08 (×2): 10 mg via INTRAVENOUS
  Administered 2015-09-08: 40 mg via INTRAVENOUS

## 2015-09-08 MED ORDER — MENTHOL 3 MG MT LOZG: 1.0000 | LOZENGE | OROMUCOSAL | Status: DC | PRN

## 2015-09-08 MED ORDER — HYDROMORPHONE HCL 1 MG/ML IJ SOLN
0.2500 mg | INTRAMUSCULAR | Status: DC | PRN
  Administered 2015-09-08 (×2): 0.5 mg via INTRAVENOUS

## 2015-09-08 MED ORDER — HEMOSTATIC AGENTS (NO CHARGE) OPTIME
TOPICAL | Status: DC | PRN
  Administered 2015-09-08: 1 via TOPICAL

## 2015-09-08 SURGICAL SUPPLY — 41 items
APL SKNCLS STERI-STRIP NONHPOA (GAUZE/BANDAGES/DRESSINGS) ×1
BAG DECANTER FOR FLEXI CONT (MISCELLANEOUS) ×3 IMPLANT
BENZOIN TINCTURE PRP APPL 2/3 (GAUZE/BANDAGES/DRESSINGS) ×3 IMPLANT
BUR MATCHSTICK NEURO 3.0 LAGG (BURR) ×3 IMPLANT
CANISTER SUCT 3000ML PPV (MISCELLANEOUS) ×3 IMPLANT
CLOSURE WOUND 1/2 X4 (GAUZE/BANDAGES/DRESSINGS) ×1
DRAPE LAPAROTOMY 100X72X124 (DRAPES) ×3 IMPLANT
DRAPE MICROSCOPE LEICA (MISCELLANEOUS) ×3 IMPLANT
DRAPE POUCH INSTRU U-SHP 10X18 (DRAPES) ×3 IMPLANT
DRAPE SURG 17X23 STRL (DRAPES) ×3 IMPLANT
DRSG OPSITE POSTOP 4X6 (GAUZE/BANDAGES/DRESSINGS) ×2 IMPLANT
DURAPREP 26ML APPLICATOR (WOUND CARE) ×3 IMPLANT
ELECT REM PT RETURN 9FT ADLT (ELECTROSURGICAL) ×3
ELECTRODE REM PT RTRN 9FT ADLT (ELECTROSURGICAL) ×1 IMPLANT
GAUZE SPONGE 4X4 16PLY XRAY LF (GAUZE/BANDAGES/DRESSINGS) IMPLANT
GLOVE BIO SURGEON STRL SZ8 (GLOVE) ×3 IMPLANT
GOWN STRL REUS W/ TWL LRG LVL3 (GOWN DISPOSABLE) IMPLANT
GOWN STRL REUS W/ TWL XL LVL3 (GOWN DISPOSABLE) ×1 IMPLANT
GOWN STRL REUS W/TWL 2XL LVL3 (GOWN DISPOSABLE) IMPLANT
GOWN STRL REUS W/TWL LRG LVL3 (GOWN DISPOSABLE)
GOWN STRL REUS W/TWL XL LVL3 (GOWN DISPOSABLE) ×3
HEMOSTAT POWDER KIT SURGIFOAM (HEMOSTASIS) IMPLANT
KIT BASIN OR (CUSTOM PROCEDURE TRAY) ×3 IMPLANT
KIT ROOM TURNOVER OR (KITS) ×3 IMPLANT
NDL HYPO 25X1 1.5 SAFETY (NEEDLE) ×1 IMPLANT
NDL SPNL 20GX3.5 QUINCKE YW (NEEDLE) IMPLANT
NEEDLE HYPO 25X1 1.5 SAFETY (NEEDLE) ×3 IMPLANT
NEEDLE SPNL 20GX3.5 QUINCKE YW (NEEDLE) IMPLANT
NS IRRIG 1000ML POUR BTL (IV SOLUTION) ×3 IMPLANT
PACK LAMINECTOMY NEURO (CUSTOM PROCEDURE TRAY) ×3 IMPLANT
PAD ARMBOARD 7.5X6 YLW CONV (MISCELLANEOUS) ×9 IMPLANT
RUBBERBAND STERILE (MISCELLANEOUS) ×6 IMPLANT
SPONGE SURGIFOAM ABS GEL SZ50 (HEMOSTASIS) ×3 IMPLANT
STRIP CLOSURE SKIN 1/2X4 (GAUZE/BANDAGES/DRESSINGS) ×2 IMPLANT
SUT VIC AB 0 CT1 18XCR BRD8 (SUTURE) ×1 IMPLANT
SUT VIC AB 0 CT1 8-18 (SUTURE) ×3
SUT VIC AB 2-0 CP2 18 (SUTURE) ×3 IMPLANT
SUT VIC AB 3-0 SH 8-18 (SUTURE) ×3 IMPLANT
TOWEL OR 17X24 6PK STRL BLUE (TOWEL DISPOSABLE) ×3 IMPLANT
TOWEL OR 17X26 10 PK STRL BLUE (TOWEL DISPOSABLE) ×3 IMPLANT
WATER STERILE IRR 1000ML POUR (IV SOLUTION) ×3 IMPLANT

## 2015-09-08 NOTE — Anesthesia Procedure Notes (Signed)
Procedure Name: Intubation Date/Time: 09/08/2015 12:39 PM Performed by: Tressia Miners LEFFEW Pre-anesthesia Checklist: Patient identified, Patient being monitored, Timeout performed, Emergency Drugs available and Suction available Patient Re-evaluated:Patient Re-evaluated prior to inductionOxygen Delivery Method: Circle System Utilized Preoxygenation: Pre-oxygenation with 100% oxygen Intubation Type: IV induction Ventilation: Mask ventilation without difficulty Laryngoscope Size: Mac and 3 Grade View: Grade II Tube type: Oral Tube size: 7.5 mm Number of attempts: 1 Airway Equipment and Method: Stylet Placement Confirmation: ETT inserted through vocal cords under direct vision,  positive ETCO2 and breath sounds checked- equal and bilateral Secured at: 21 cm Tube secured with: Tape Dental Injury: Teeth and Oropharynx as per pre-operative assessment

## 2015-09-08 NOTE — H&P (Signed)
Subjective: Patient is a 64 y.o. male admitted for final stenosis L2-3 adjacent to previous fusion. Onset of symptoms was several months ago, gradually worsening since that time.  The pain is rated severe, and is located at the across the lower back and radiates to his legs. The pain is described as aching and occurs intermittently. The symptoms have been progressive. Symptoms are exacerbated by exercise. MRI or CT showed adjacent level spinal stenosis L2-3   Past Medical History  Diagnosis Date  . DM (diabetes mellitus) ([place])   . Benign neoplasm of colon   . Abnormal involuntary movements(781.0)   . Cough   . Pain in limb   . Restless legs syndrome (RLS)   . Obesity, unspecified   . Unspecified local infection of skin and subcutaneous tissue   . Blisters with epidermal loss due to burn (second degree) of foot   . Tobacco use disorder   . Hypertrophy of prostate with urinary obstruction and other lower urinary tract symptoms (LUTS)   . Spinal stenosis, unspecified region other than cervical   . Loss of weight   . Acute bronchitis   . Allergic rhinitis due to pollen   . Degeneration of lumbar or lumbosacral intervertebral disc   . Depressive disorder, not elsewhere classified   . Diverticulosis of colon (without mention of hemorrhage)   . Esophageal reflux   . Unspecified essential hypertension   . Edema   . Unspecified disease of pericardium   . Varices of other sites   . Sleep disturbance 03/24/2014  . Complication of anesthesia     pt woke up during saphenous vein surgery  . Constipation due to pain medication     Past Surgical History  Procedure Laterality Date  . Saphenous vein graft resection Right 1991  . Spine surgery  11/2010 & 12/2010  . Tonsillectomy    . Colonoscopy w/ polypectomy      Prior to Admission medications   Medication Sig Start Date End Date Taking? Authorizing Provider  Cholecalciferol (VITAMIN D3) 2000 UNITS TABS Take 1 tablet by mouth 2 (two) times  daily.   Yes Historical Provider, MD  cyclobenzaprine (FLEXERIL) 10 MG tablet Take 10 mg by mouth 3 (three) times daily.   Yes Historical Provider, MD  DULoxetine (CYMBALTA) 60 MG capsule Take 120 mg by mouth daily.    Yes Historical Provider, MD  dutasteride (AVODART) 0.5 MG capsule Take 0.5 mg by mouth daily.   Yes Historical Provider, MD  HYDROcodone-acetaminophen (NORCO/VICODIN) 5-325 MG per tablet Take 1 tablet by mouth 2 (two) times daily. Take two every day for back pain per patient 03/08/14  Yes Historical Provider, MD  ibuprofen (ADVIL,MOTRIN) 800 MG tablet Take 800 mg by mouth 3 (three) times daily. Take every day per patient   Yes Historical Provider, MD  loratadine (CLARITIN) 10 MG tablet Take 10 mg by mouth daily.   Yes Historical Provider, MD  metFORMIN (GLUCOPHAGE) 500 MG tablet Take 500 mg by mouth daily with breakfast.   Yes Historical Provider, MD  metoprolol succinate (TOPROL-XL) 50 MG 24 hr tablet Take 50 mg by mouth daily.  02/22/14  Yes Historical Provider, MD  Multiple Vitamin (MULTIVITAMIN WITH MINERALS) TABS tablet Take 1 tablet by mouth daily.   Yes Historical Provider, MD  naloxegol oxalate (MOVANTIK) 25 MG TABS tablet Take 25 mg by mouth daily.   Yes Historical Provider, MD  omeprazole (PRILOSEC) 20 MG capsule Take 20 mg by mouth 2 (two) times daily before a meal.  Yes Historical Provider, MD  oxymorphone (OPANA) 10 MG tablet Take 10 mg by mouth 2 (two) times daily.    Yes Historical Provider, MD  pregabalin (LYRICA) 75 MG capsule Take 75 mg by mouth 2 (two) times daily.   Yes Historical Provider, MD  rOPINIRole (REQUIP) 1 MG tablet Take 1 mg by mouth at bedtime.   Yes Historical Provider, MD  tamsulosin (FLOMAX) 0.4 MG CAPS capsule Take 0.4 mg by mouth daily.   Yes Historical Provider, MD  telmisartan-hydrochlorothiazide (MICARDIS HCT) 80-12.5 MG per tablet Take 1 tablet by mouth daily.   Yes Historical Provider, MD  carbidopa-levodopa (SINEMET IR) 25-100 MG per tablet  Take 1 tablet by mouth 3 (three) times daily before meals. Patient not taking: Reported on 05/22/2015 09/21/14   Penni Bombard, MD  ibuprofen (ADVIL,MOTRIN) 800 MG tablet Take 1 tablet (800 mg total) by mouth every 8 (eight) hours as needed for pain. Patient not taking: Reported on 05/22/2015 06/03/13   Dalia Heading, PA-C  PROAIR HFA 108 (90 BASE) MCG/ACT inhaler Inhale 2 puffs into the lungs every 4 (four) hours as needed for wheezing.  07/30/14   Historical Provider, MD   Allergies  Allergen Reactions  . Avelox [Moxifloxacin] Other (See Comments)    Angioedema Fatigue  . Quinolones Other (See Comments)    Angioedema  . Lexapro [Escitalopram] Other (See Comments)    unknown    Social History  Substance Use Topics  . Smoking status: Current Every Day Smoker -- 40 years    Types: Pipe  . Smokeless tobacco: Not on file     Comment: moderate  . Alcohol Use: No    Family History  Problem Relation Age of Onset  . Cancer Mother   . Heart failure Father   . Tuberculosis Maternal Uncle   . Cancer - Colon Maternal Uncle   . Hypertension Paternal Grandfather      Review of Systems  Positive ROS: Negative  All other systems have been reviewed and were otherwise negative with the exception of those mentioned in the HPI and as above.  Objective: Vital signs in last 24 hours: Temp:  [98.1 F (36.7 C)] 98.1 F (36.7 C) (12/15 1127) Pulse Rate:  [86] 86 (12/15 1127) Resp:  [18] 18 (12/15 1127) BP: (142-176)/(90-101) 142/90 mmHg (12/15 1145) SpO2:  [97 %] 97 % (12/15 1127) Weight:  [106.595 kg (235 lb)] 106.595 kg (235 lb) (12/15 1127)  General Appearance: Alert, cooperative, no distress, appears stated age Head: Normocephalic, without obvious abnormality, atraumatic Eyes: PERRL, conjunctiva/corneas clear, EOM's intact    Neck: Supple, symmetrical, trachea midline Back: Symmetric, no curvature, ROM normal, no CVA tenderness Lungs:  respirations unlabored Heart: Regular  rate and rhythm Abdomen: Soft, non-tender Extremities: Extremities normal, atraumatic, no cyanosis or edema Pulses: 2+ and symmetric all extremities Skin: Skin color, texture, turgor normal, no rashes or lesions  NEUROLOGIC:   Mental status: Alert and oriented x4,  no aphasia, good attention span, fund of knowledge, and memory Motor Exam - grossly normal Sensory Exam - decreased in a stocking distribution Reflexes: 1+ Coordination - grossly normal Gait - antalgic Balance - grossly normal Cranial Nerves: I: smell Not tested  II: visual acuity  OS: nl    OD: nl  II: visual fields Full to confrontation  II: pupils Equal, round, reactive to light  III,VII: ptosis None  III,IV,VI: extraocular muscles  Full ROM  V: mastication Normal  V: facial light touch sensation  Normal  V,VII: corneal  reflex  Present  VII: facial muscle function - upper  Normal  VII: facial muscle function - lower Normal  VIII: hearing Not tested  IX: soft palate elevation  Normal  IX,X: gag reflex Present  XI: trapezius strength  5/5  XI: sternocleidomastoid strength 5/5  XI: neck flexion strength  5/5  XII: tongue strength  Normal    Data Review Lab Results  Component Value Date   WBC 6.9 08/30/2015   HGB 13.9 08/30/2015   HCT 41.0 08/30/2015   MCV 91.7 08/30/2015   PLT  08/30/2015    PLATELET CLUMPS NOTED ON SMEAR, COUNT APPEARS ADEQUATE   Lab Results  Component Value Date   NA 136 08/30/2015   K 4.1 08/30/2015   CL 100* 08/30/2015   CO2 25 08/30/2015   BUN 18 08/30/2015   CREATININE 0.88 08/30/2015   GLUCOSE 107* 08/30/2015   Lab Results  Component Value Date   INR 1.18 08/30/2015    Assessment/Plan: Patient admitted for decompressive laminectomy L2-3. Patient has failed a reasonable attempt at conservative therapy.  I explained the condition and procedure to the patient and answered any questions.  Patient wishes to proceed with procedure as planned. Understands risks/ benefits and  typical outcomes of procedure.   Geraldene Eisel S 09/08/2015 11:57 AM

## 2015-09-08 NOTE — Op Note (Signed)
09/08/2015  1:59 PM  PATIENT:  Douglas Price  64 y.o. male  PRE-OPERATIVE DIAGNOSIS:  Adjacent level stenosis L2-3 with back and leg pain  POST-OPERATIVE DIAGNOSIS:  Same  PROCEDURE:  Decompressive lumbar laminectomy medial facetectomy foraminotomy at L2-3 on the right followed by sublaminar decompression for symptomatic canal and left lateral recess decompression  SURGEON:  Sherley Bounds, MD  ASSISTANTS: DR Ditty  ANESTHESIA:   General  EBL: 25 ml  Total I/O In: 1000 [I.V.:1000] Out: 25 [Blood:25]  BLOOD ADMINISTERED:none  DRAINS: None   SPECIMEN:  No Specimen  INDICATION FOR PROCEDURE: This patient presented with bilateral leg pain. He had weakness in his right leg. He had had a previous L3-4 and L4-5 fusion. He had severe adjacent level stenosis. We considered fusion versus simple decompression. We decided to try simple decompression. Patient understood the risks, benefits, and alternatives and potential outcomes and wished to proceed.  PROCEDURE DETAILS: The patient was taken to the operating room and after induction of adequate generalized endotracheal anesthesia, the patient was rolled into the prone position on the Wilson frame and all pressure points were padded. The lumbar region was cleaned and then prepped with DuraPrep and draped in the usual sterile fashion. 5 cc of local anesthesia was injected and then a dorsal midline incision was made and carried down to the lumbo sacral fascia. The fascia was opened and the paraspinous musculature was taken down in a subperiosteal fashion to expose L2-3 on the right. Intraoperative x-ray confirmed my level, and then I used a combination of the high-speed drill and the Kerrison punches to perform a hemilaminectomy, medial facetectomy, and foraminotomy at L2-3 on the right. The underlying yellow ligament was opened and removed in a piecemeal fashion to expose the underlying dura and exiting nerve root. I undercut the lateral recess  and dissected down until I was medial to and distal to the pedicle. The nerve root was well decompressed. We then gently retracted the nerve root medially with a retractor, coagulated the epidural venous vasculature, and incised the disc space. I then drilled up under the spinous process and under the left lamina and bit away the yellow ligament until the left lateral recess and nerve root was decompressed to the pedicle. I then palpated with a coronary dilator along the nerve root and into the foramen to assure adequate decompression. I felt no more compression of the nerve root. I irrigated with saline solution containing bacitracin. Achieved hemostasis with bipolar cautery, lined the dura with Gelfoam, and then closed the fascia with 0 Vicryl. I closed the subcutaneous tissues with 2-0 Vicryl and the subcuticular tissues with 3-0 Vicryl. The skin was then closed with benzoin and Steri-Strips. The drapes were removed, a sterile dressing was applied. The patient was awakened from general anesthesia and transferred to the recovery room in stable condition. At the end of the procedure all sponge, needle and instrument counts were correct.   PLAN OF CARE: Admit for overnight observation  PATIENT DISPOSITION:  PACU - hemodynamically stable.   Delay start of Pharmacological VTE agent (>24hrs) due to surgical blood loss or risk of bleeding:  yes

## 2015-09-08 NOTE — Transfer of Care (Signed)
Immediate Anesthesia Transfer of Care Note  Patient: Douglas Price  Procedure(s) Performed: Procedure(s): Laminectomy and Foraminotomy - Lumbar two-lumbar three bilateral (Bilateral)  Patient Location: PACU  Anesthesia Type:General  Level of Consciousness: awake, alert , patient cooperative and responds to stimulation  Airway & Oxygen Therapy: Patient Spontanous Breathing  Post-op Assessment: Report given to RN, Post -op Vital signs reviewed and stable and Patient moving all extremities X 4  Post vital signs: Reviewed and stable  Last Vitals:  Filed Vitals:   09/08/15 1127 09/08/15 1145  BP: 176/101 142/90  Pulse: 86   Temp: 36.7 C   Resp: 18     Complications: No apparent anesthesia complications

## 2015-09-08 NOTE — Anesthesia Postprocedure Evaluation (Signed)
Anesthesia Post Note  Patient: Douglas Price  Procedure(s) Performed: Procedure(s) (LRB): Laminectomy and Foraminotomy - Lumbar two-lumbar three bilateral (Bilateral)  Patient location during evaluation: PACU Anesthesia Type: General Level of consciousness: sedated and patient cooperative Pain management: pain level controlled Vital Signs Assessment: post-procedure vital signs reviewed and stable Respiratory status: spontaneous breathing Cardiovascular status: stable Anesthetic complications: no    Last Vitals:  Filed Vitals:   09/08/15 1615 09/08/15 1630  BP: 140/80 137/86  Pulse:    Temp:    Resp:      Last Pain:  Filed Vitals:   09/08/15 1643  PainSc: 2                  Nolon Nations

## 2015-09-08 NOTE — Progress Notes (Signed)
Kristen Cardinal in IT service response to notify of broken scanner

## 2015-09-08 NOTE — Anesthesia Preprocedure Evaluation (Addendum)
Anesthesia Evaluation  Patient identified by MRN, date of birth, ID band Patient awake    Reviewed: Allergy & Precautions, NPO status , Patient's Chart, lab work & pertinent test results, reviewed documented beta blocker date and time   Airway Mallampati: II  TM Distance: >3 FB Neck ROM: Full    Dental no notable dental hx.    Pulmonary Current Smoker,    Pulmonary exam normal breath sounds clear to auscultation       Cardiovascular hypertension, Pt. on medications and Pt. on home beta blockers Normal cardiovascular exam Rhythm:Regular Rate:Normal     Neuro/Psych PSYCHIATRIC DISORDERS Depression negative neurological ROS     GI/Hepatic Neg liver ROS, GERD  ,  Endo/Other  negative endocrine ROSdiabetes, Type 2  Renal/GU negative Renal ROS     Musculoskeletal  (+) Arthritis ,   Abdominal   Peds  Hematology negative hematology ROS (+)   Anesthesia Other Findings   Reproductive/Obstetrics negative OB ROS                             Anesthesia Physical Anesthesia Plan  ASA: II  Anesthesia Plan: General   Post-op Pain Management:    Induction: Intravenous  Airway Management Planned: Oral ETT  Additional Equipment:   Intra-op Plan:   Post-operative Plan: Extubation in OR  Informed Consent: I have reviewed the patients History and Physical, chart, labs and discussed the procedure including the risks, benefits and alternatives for the proposed anesthesia with the patient or authorized representative who has indicated his/her understanding and acceptance.   Dental advisory given  Plan Discussed with: CRNA  Anesthesia Plan Comments:        Anesthesia Quick Evaluation

## 2015-09-09 ENCOUNTER — Encounter (HOSPITAL_COMMUNITY): Payer: Self-pay | Admitting: Neurological Surgery

## 2015-09-09 DIAGNOSIS — M4806 Spinal stenosis, lumbar region: Secondary | ICD-10-CM | POA: Diagnosis not present

## 2015-09-09 LAB — GLUCOSE, CAPILLARY: Glucose-Capillary: 104 mg/dL — ABNORMAL HIGH (ref 65–99)

## 2015-09-09 LAB — HEMOGLOBIN A1C
Hgb A1c MFr Bld: 6.6 % — ABNORMAL HIGH (ref 4.8–5.6)
Mean Plasma Glucose: 143 mg/dL

## 2015-09-09 MED ORDER — HYDROCODONE-ACETAMINOPHEN 5-325 MG PO TABS: 1.0000 | ORAL_TABLET | Freq: Four times a day (QID) | ORAL | Status: DC | PRN

## 2015-09-09 NOTE — Progress Notes (Signed)
Pt is doing well. Pt and family given D/C instructions with Rx's, verbal understanding was provided. Pt's IV was removed prior to D/C. Pt's incision is covered with Honeycomb dressing and is clean and dry. Pt D/C'd home via wheelchair @ 1500 per MD order. Pt is stable @ D/C and has no other needs at this time. Holli Humbles, RN

## 2015-09-09 NOTE — Discharge Instructions (Signed)
Wound Care Keep incision covered and dry for one week.  If you shower prior to then, cover incision with plastic wrap.  You may remove outer bandage after one week and shower.  Do not put any creams, lotions, or ointments on incision. Leave steri-strips on neck.  They will fall off by themselves. Activity Walk each and every day, increasing distance each day. No lifting greater than 5 lbs.  Avoid bending, arching, or twisting. No driving for 2 weeks; may ride as a passenger locally. If provided with back brace, wear when out of bed.  It is not necessary to wear in bed. Diet Resume your normal diet.  Return to Work Will be discussed at you follow up appointment. Call Your Doctor If Any of These Occur Redness, drainage, or swelling at the wound.  Temperature greater than 101 degrees. Severe pain not relieved by pain medication. Incision starts to come apart. Follow Up Appt Call today for appointment in 1-2 weeks [phone]) or for problems.  If you have any hardware placed in your spine, you will need an x-ray before your appointment.  Laminectomy During a laminectomy, small pieces of bone in the spine called lamina are removed. The ligaments underneath the lamina and parts of the joints that have grown too large are also removed. This takes pressure off the nerves.  LET [facility] CARE PROVIDER KNOW ABOUT:  Any allergies you have.  All medicines you are taking, including vitamins, herbs, eye drops, creams, and over-the-counter medicines.  Previous problems you or members of your family have had with the use of anesthetics.  Any blood disorders you have.  Previous surgeries you have had.  Medical conditions you have. RISKS AND COMPLICATIONS  Generally, laminectomy is a safe procedure. However, as with any procedure, complications can occur. Possible complications include:  Infection near the incision.  Nerve damage. Signs of this can be pain, weakness, or numbness.  Leaking  of spinal fluid.  Blood clot in a leg. The clot can move to the lungs. This can be very serious.  Bowel or bladder incontinence (rare). BEFORE THE PROCEDURE   You will need to stop taking certain medicines as directed by your health care provider.  If you smoke, stop at least 2 weeks before the procedure. Smoking can slow down the healing process and increase the risk of complications.  Do not eat or drink anything for at least 8 hours before the procedure. Take any medicines that your health care provider tells you to keep taking with a sip of water.  Do not drink alcohol the day before your surgery.  Tell your health care provider if you develop a cold or any infection before your surgery.  Arrange for someone to drive you home after the procedure or after your hospital stay. Also arrange for someone to help you with activities during recovery. PROCEDURE  Small monitors will be placed on your body. They are used to check your heart, blood pressure, and oxygen level.  An IV tube will be inserted into one of your veins. Medicine will flow directly into your body through the IV tube.  You might be given a sedative. This will help you relax.  You will be given a medicine to make you sleep (general anesthetic), and a breathing tube will be placed into your lungs. During general anesthesia, you are unaware of the procedure and do not feel any pain.  Your back will be cleaned with a special solution to kill germs on your  skin.  Once you are asleep, the surgeon will make a 2-inch to 5-inch cut (incision) in your back. The length of the incision will depend on how many spinal bones (vertebrae) are being operated on.  Muscles in the back will be moved away from the vertebrae and pulled to the side.  Pieces of lamina will be removed.  The ligament that lies under the lamina and connects your vertebrae will be removed.  Enough ligaments and thickened joints will be removed to take  pressure off your nerves.  Your nerves will be identified, and their passage will be tracked and assessed for excessive tightness.  Your back muscles will be moved back into their normal position.  The area under your skin will be closed with small, absorbable stitches. These stitches do not need to be removed.  Your skin will be closed with small absorbable stitches or staples.  A dressing will be put over your incision.  The procedure may take 1-3 hours. AFTER THE PROCEDURE   You will stay in a recovery area until the anesthesia has worn off. Your blood pressure and pulse will be checked every so often. Then you will be taken to a hospital room.  You may continue to get fluids through the IV tube for a while.  Some pain is normal. You may be given pain medicine while still in the recovery area.  It is important to be up and moving as soon as possible after a surgery. Physical therapists will help you start walking.  To prevent blood clots in your legs:  You may be given special stockings to wear.  You may need to take medicine to prevent clots.  You may be asked to do special breathing exercises to re-expand your lungs. This is to prevent a lung infection.  Most people stay in the hospital for 1-3 days after a laminectomy.   This information is not intended to replace advice given to you by your health care provider. Make sure you discuss any questions you have with your health care provider.   Document Released: 08/29/2009 Document Revised: 07/01/2013 Document Reviewed: 04/22/2013 Elsevier Interactive Patient Education [employer].

## 2015-09-09 NOTE — Discharge Summary (Signed)
Physician Discharge Summary  Patient ID: Douglas Price MRN: [id number] DOB/AGE: 04-26-1951 64 y.o.  Admit date: 09/08/2015 Discharge date: 09/09/2015  Admission Diagnoses: L23 stenosis    Discharge Diagnoses: same   Discharged Condition: good  Hospital Course: The patient was admitted on 09/08/2015 and taken to the operating room where the patient underwent L2-3 decompression. The patient tolerated the procedure well and was taken to the recovery room and then to the floor in stable condition. The hospital course was routine. There were no complications. The wound remained clean dry and intact. Pt had appropriate back soreness. No complaints of leg pain or new N/T/W. The patient remained afebrile with stable vital signs, and tolerated a regular diet. The patient continued to increase activities, and pain was well controlled with oral pain medications.   Consults: None  Significant Diagnostic Studies:  Results for orders placed or performed during the hospital encounter of 09/08/15  Hemoglobin A1c  Result Value Ref Range   Hgb A1c MFr Bld 6.6 (H) 4.8 - 5.6 %   Mean Plasma Glucose 143 mg/dL  Glucose, capillary  Result Value Ref Range   Glucose-Capillary 115 (H) 65 - 99 mg/dL  Glucose, capillary  Result Value Ref Range   Glucose-Capillary 106 (H) 65 - 99 mg/dL  Glucose, capillary  Result Value Ref Range   Glucose-Capillary 144 (H) 65 - 99 mg/dL   Comment 1 Notify RN    Comment 2 Document in Chart   Glucose, capillary  Result Value Ref Range   Glucose-Capillary 104 (H) 65 - 99 mg/dL    Chest 2 View  08/30/2015  CLINICAL DATA:  Diabetes and hypertension. Preoperative evaluation for lumbar spinal stenosis. EXAM: CHEST  2 VIEW COMPARISON:  19 2014 FINDINGS: The heart size and mediastinal contours are within normal limits. Both lungs are clear. The visualized skeletal structures are unremarkable except for degenerative changes of the thoracic spine with a mild scoliosis.  IMPRESSION: No active cardiopulmonary disease. Electronically Signed   By: Jerilynn Mages.  Shick M.D.   On: 08/30/2015 16:39   Dg Lumbar Spine 2-3 Views  09/08/2015  CLINICAL DATA:  L2-3 laminectomy EXAM: LUMBAR SPINE - 2-3 VIEW COMPARISON:  07/05/2015 CT lumbar spine. FINDINGS: Two portable cross-table lateral intraoperative lumbar spine radiographs, the first of which demonstrates a posterior approach surgical marking device with the tip just posterior to the L3 spinous process, and the second of which demonstrates the tip of a posterior approach surgical marking device between the L2 and L3 spinous processes posterior to the lumbar spinal canal. Previous surgical hardware is noted in the L3-L5 lumbar spine. IMPRESSION: Posterior approach surgical marking device positions as described. Electronically Signed   By: Ilona Sorrel M.D.   On: 09/08/2015 14:43    Antibiotics:  Anti-infectives    Start     Dose/Rate Route Frequency Ordered Stop   09/08/15 2000  ceFAZolin (ANCEF) IVPB 1 g/50 mL premix     1 g 100 mL/hr over 30 Minutes Intravenous Every 8 hours 09/08/15 1941 09/09/15 0330   09/08/15 1245  bacitracin 50,000 Units in sodium chloride irrigation 0.9 % 500 mL irrigation  Status:  Discontinued       As needed 09/08/15 1326 09/08/15 1407   09/08/15 1130  ceFAZolin (ANCEF) IVPB 2 g/50 mL premix     2 g 100 mL/hr over 30 Minutes Intravenous To [facility] 09/08/15 1121 09/08/15 1241      Discharge Exam: Blood pressure 141/75, pulse 90, temperature 98.6 F (37 C),  temperature source Oral, resp. rate 20, height 6\' 3"  (1.905 m), weight 106.595 kg (235 lb), SpO2 97 %. Neurologic: Grossly normal Dressing dry  Discharge Medications:     Medication List    TAKE these medications        carbidopa-levodopa 25-100 MG tablet  Commonly known as:  SINEMET IR  Take 1 tablet by mouth 3 (three) times daily before meals.     cyclobenzaprine 10 MG tablet  Commonly known as:  FLEXERIL  Take 10 mg by  mouth 3 (three) times daily.     DULoxetine 60 MG capsule  Commonly known as:  CYMBALTA  Take 120 mg by mouth daily.     dutasteride 0.5 MG capsule  Commonly known as:  AVODART  Take 0.5 mg by mouth daily.     HYDROcodone-acetaminophen 5-325 MG tablet  Commonly known as:  NORCO/VICODIN  Take 1 tablet by mouth every 6 (six) hours as needed for moderate pain. Take two every day for back pain per patient     ibuprofen 800 MG tablet  Commonly known as:  ADVIL,MOTRIN  Take 800 mg by mouth 3 (three) times daily. Take every day per patient     ibuprofen 800 MG tablet  Commonly known as:  ADVIL,MOTRIN  Take 1 tablet (800 mg total) by mouth every 8 (eight) hours as needed for pain.     loratadine 10 MG tablet  Commonly known as:  CLARITIN  Take 10 mg by mouth daily.     metFORMIN 500 MG tablet  Commonly known as:  GLUCOPHAGE  Take 500 mg by mouth daily with breakfast.     metoprolol succinate 50 MG 24 hr tablet  Commonly known as:  TOPROL-XL  Take 50 mg by mouth daily.     MOVANTIK 25 MG Tabs tablet  Generic drug:  naloxegol oxalate  Take 25 mg by mouth daily.     multivitamin with minerals Tabs tablet  Take 1 tablet by mouth daily.     omeprazole 20 MG capsule  Commonly known as:  PRILOSEC  Take 20 mg by mouth 2 (two) times daily before a meal.     oxymorphone 10 MG tablet  Commonly known as:  OPANA  Take 10 mg by mouth 2 (two) times daily.     pregabalin 75 MG capsule  Commonly known as:  LYRICA  Take 75 mg by mouth 2 (two) times daily.     PROAIR HFA 108 (90 BASE) MCG/ACT inhaler  Generic drug:  albuterol  Inhale 2 puffs into the lungs every 4 (four) hours as needed for wheezing.     rOPINIRole 1 MG tablet  Commonly known as:  REQUIP  Take 1 mg by mouth at bedtime.     tamsulosin 0.4 MG Caps capsule  Commonly known as:  FLOMAX  Take 0.4 mg by mouth daily.     telmisartan-hydrochlorothiazide 80-12.5 MG tablet  Commonly known as:  MICARDIS HCT  Take 1 tablet  by mouth daily.     Vitamin D3 2000 UNITS Tabs  Take 1 tablet by mouth 2 (two) times daily.        Disposition: home   Final Dx: LL L2-3      Discharge Instructions     Remove dressing in 72 hours    Complete by:  As directed      Call MD for:  difficulty breathing, headache or visual disturbances    Complete by:  As directed      Call  MD for:  persistant nausea and vomiting    Complete by:  As directed      Call MD for:  redness, tenderness, or signs of infection (pain, swelling, redness, odor or green/yellow discharge around incision site)    Complete by:  As directed      Call MD for:  severe uncontrolled pain    Complete by:  As directed      Call MD for:  temperature >100.4    Complete by:  As directed      Diet - low sodium heart healthy    Complete by:  As directed      Increase activity slowly    Complete by:  As directed      Lifting restrictions    Complete by:  As directed   Less than 10 lbs              Signed: Camerin Jimenez S 09/09/2015, 12:56 PM

## 2016-02-15 ENCOUNTER — Other Ambulatory Visit: Payer: Self-pay | Admitting: Neurological Surgery

## 2016-03-07 ENCOUNTER — Encounter (HOSPITAL_COMMUNITY): Payer: Self-pay

## 2016-03-07 ENCOUNTER — Encounter (HOSPITAL_COMMUNITY)
Admission: RE | Admit: 2016-03-07 | Discharge: 2016-03-07 | Disposition: A | Discharge status: Home / Self Care | Payer: Worker's Compensation | Source: Ambulatory Visit | Attending: Neurological Surgery | Admitting: Neurological Surgery

## 2016-03-07 DIAGNOSIS — M4800 Spinal stenosis, site unspecified: Secondary | ICD-10-CM | POA: Diagnosis not present

## 2016-03-07 DIAGNOSIS — Z01812 Encounter for preprocedural laboratory examination: Secondary | ICD-10-CM | POA: Diagnosis not present

## 2016-03-07 DIAGNOSIS — Z0183 Encounter for blood typing: Secondary | ICD-10-CM | POA: Diagnosis not present

## 2016-03-07 HISTORY — DX: Myoneural disorder, unspecified: G70.9

## 2016-03-07 LAB — CBC WITH DIFFERENTIAL/PLATELET
BASOS ABS: 0.1 10*3/uL (ref 0.0–0.1)
Basophils Relative: 1 %
EOS PCT: 7 %
Eosinophils Absolute: 0.6 10*3/uL (ref 0.0–0.7)
HEMATOCRIT: 40.5 % (ref 39.0–52.0)
Hemoglobin: 13.3 g/dL (ref 13.0–17.0)
LYMPHS ABS: 2.9 10*3/uL (ref 0.7–4.0)
LYMPHS PCT: 34 %
MCH: 29.5 pg (ref 26.0–34.0)
MCHC: 32.8 g/dL (ref 30.0–36.0)
MCV: 89.8 fL (ref 78.0–100.0)
MONO ABS: 0.9 10*3/uL (ref 0.1–1.0)
MONOS PCT: 10 %
NEUTROS ABS: 4.1 10*3/uL (ref 1.7–7.7)
Neutrophils Relative %: 48 %
PLATELETS: 244 10*3/uL (ref 150–400)
RBC: 4.51 MIL/uL (ref 4.22–5.81)
RDW: 13.9 % (ref 11.5–15.5)
WBC: 8.6 10*3/uL (ref 4.0–10.5)

## 2016-03-07 LAB — GLUCOSE, CAPILLARY: Glucose-Capillary: 124 mg/dL — ABNORMAL HIGH (ref 65–99)

## 2016-03-07 LAB — BASIC METABOLIC PANEL
ANION GAP: 7 (ref 5–15)
BUN: 17 mg/dL (ref 6–20)
CHLORIDE: 102 mmol/L (ref 101–111)
CO2: 28 mmol/L (ref 22–32)
Calcium: 9.1 mg/dL (ref 8.9–10.3)
Creatinine, Ser: 0.81 mg/dL (ref 0.61–1.24)
GFR calc Af Amer: >60 mL/min (ref >60)
GFR calc non Af Amer: >60 mL/min (ref >60)
GLUCOSE: 103 mg/dL — AB (ref 65–99)
POTASSIUM: 4 mmol/L (ref 3.5–5.1)
Sodium: 137 mmol/L (ref 135–145)

## 2016-03-07 LAB — PROTIME-INR
INR: 1.17 (ref 0.00–1.49)
Prothrombin Time: 15 seconds (ref 11.6–15.2)

## 2016-03-07 LAB — SURGICAL PCR SCREEN
MRSA, PCR: NEGATIVE
Staphylococcus aureus: NEGATIVE

## 2016-03-07 LAB — TYPE AND SCREEN
ABO/RH(D): A POS
Antibody Screen: NEGATIVE

## 2016-03-07 NOTE — Pre-Procedure Instructions (Addendum)
Douglas Price  03/07/2016      [id number] - [place], [place] [facility] [address] [place] [address] Phone: [phone] Fax: [phone]    Your procedure is scheduled on 03/14/16  Report to [facility] Admitting at 1015 A.M.  Call this number if you have problems the morning of surgery:  [phone]   Remember:  Do not eat food or drink liquids after midnight.  Take these medicines the morning of surgery with A SIP OF WATER   duloxetine(cymbalta),metoprolol,prilosec,flomax, avodart,pain med if needed Stop movantik few days before surgery  STOP all herbel meds, nsaids (aleve,naproxen,advil,ibuprofen) 5 days prior to surgery including vitamins,aspirin  NO metformin morning of surgery     How to Manage Your Diabetes Before and After Surgery  Why is it important to control my blood sugar before and after surgery? . Improving blood sugar levels before and after surgery helps healing and can limit problems. . A way of improving blood sugar control is eating a healthy diet by: o  Eating less sugar and carbohydrates o  Increasing activity/exercise o  Talking with your doctor about reaching your blood sugar goals . High blood sugars (greater than 180 mg/dL) can raise your risk of infections and slow your recovery, so you will need to focus on controlling your diabetes during the weeks before surgery. . Make sure that the doctor who takes care of your diabetes knows about your planned surgery including the date and location.  How do I manage my blood sugar before surgery? . Check your blood sugar at least 4 times a day, starting 2 days before surgery, to make sure that the level is not too high or low. o Check your blood sugar the morning of your surgery when you wake up and every 2 hours until you get to the Short Stay unit. . If your blood sugar is less than 70 mg/dL, you will need to treat for  low blood sugar: o Do not take insulin. o Treat a low blood sugar (less than 70 mg/dL) with  cup of clear juice (cranberry or apple), 4 glucose tablets, OR glucose gel. o Recheck blood sugar in 15 minutes after treatment (to make sure it is greater than 70 mg/dL). If your blood sugar is not greater than 70 mg/dL on recheck, call [phone] for further instructions. . Report your blood sugar to the short stay nurse when you get to Short Stay.  . If you are admitted to the hospital after surgery: o Your blood sugar will be checked by the staff and you will probably be given insulin after surgery (instead of oral diabetes medicines) to make sure you have good blood sugar levels. o The goal for blood sugar control after surgery is 80-180 mg/dL.   WHAT DO I DO ABOUT MY DIABETES MEDICATION?   Marland Kitchen Do not take oral diabetes medicines (pills) the morning of surgery.(metformin)  .      Do not wear jewelry, make-up or nail polish.  Do not wear lotions, powders, or perfumes.  You may wear deoderant.  Do not shave 48 hours prior to surgery.  Men may shave face and neck.  Do not bring valuables to the hospital.  [facility] is not responsible for any belongings or valuables.  Contacts, dentures or bridgework may not be worn into surgery.  Leave your suitcase in the car.  After surgery it may  be brought to your room.  For patients admitted to the hospital, discharge time will be determined by your treatment team.  Patients discharged the day of surgery will not be allowed to drive home.   Name and phone number of your driver:    Special instructions:   Special Instructions: [place] - Preparing for Surgery  Before surgery, you can play an important role.  Because skin is not sterile, your skin needs to be as free of germs as possible.  You can reduce the number of germs on you skin by washing with CHG (chlorahexidine gluconate) soap before surgery.  CHG is an antiseptic cleaner which kills germs  and bonds with the skin to continue killing germs even after washing.  Please DO NOT use if you have an allergy to CHG or antibacterial soaps.  If your skin becomes reddened/irritated stop using the CHG and inform your nurse when you arrive at Short Stay.  Do not shave (including legs and underarms) for at least 48 hours prior to the first CHG shower.  You may shave your face.  Please follow these instructions carefully:   1.  Shower with CHG Soap the night before surgery and the morning of Surgery.  2.  If you choose to wash your hair, wash your hair first as usual with your normal shampoo.  3.  After you shampoo, rinse your hair and body thoroughly to remove the Shampoo.  4.  Use CHG as you would any other liquid soap.  You can apply chg directly  to the skin and wash gently with scrungie or a clean washcloth.  5.  Apply the CHG Soap to your body ONLY FROM THE NECK DOWN.  Do not use on open wounds or open sores.  Avoid contact with your eyes ears, mouth and genitals (private parts).  Wash genitals (private parts)       with your normal soap.  6.  Wash thoroughly, paying special attention to the area where your surgery will be performed.  7.  Thoroughly rinse your body with warm water from the neck down.  8.  DO NOT shower/wash with your normal soap after using and rinsing off the CHG Soap.  9.  Pat yourself dry with a clean towel.            10.  Wear clean pajamas.            11.  Place clean sheets on your bed the night of your first shower and do not sleep with pets.  Day of Surgery  Do not apply any lotions/deodorants the morning of surgery.  Please wear clean clothes to the hospital/surgery center.  Please read over the following fact sheets that you were given.

## 2016-03-08 LAB — HEMOGLOBIN A1C
Hgb A1c MFr Bld: 6.4 % — ABNORMAL HIGH (ref 4.8–5.6)
MEAN PLASMA GLUCOSE: 137 mg/dL

## 2016-03-13 MED ORDER — DEXAMETHASONE SODIUM PHOSPHATE 10 MG/ML IJ SOLN
10.0000 mg | INTRAMUSCULAR | Status: AC
  Administered 2016-03-14: 5 mg via INTRAVENOUS
  Filled 2016-03-13: qty 1

## 2016-03-13 MED ORDER — CEFAZOLIN SODIUM-DEXTROSE 2-4 GM/100ML-% IV SOLN
2.0000 g | INTRAVENOUS | Status: AC
  Administered 2016-03-14: 2 g via INTRAVENOUS
  Filled 2016-03-13: qty 100

## 2016-03-14 ENCOUNTER — Inpatient Hospital Stay (HOSPITAL_COMMUNITY): Payer: Worker's Compensation | Admitting: Certified Registered Nurse Anesthetist

## 2016-03-14 ENCOUNTER — Inpatient Hospital Stay (HOSPITAL_COMMUNITY): Payer: Worker's Compensation

## 2016-03-14 ENCOUNTER — Encounter (HOSPITAL_COMMUNITY)
Admission: RE | Disposition: A | Discharge status: Home Health | Payer: Self-pay | Source: Ambulatory Visit | Attending: Neurological Surgery

## 2016-03-14 ENCOUNTER — Encounter (HOSPITAL_COMMUNITY): Payer: Self-pay | Admitting: *Deleted

## 2016-03-14 ENCOUNTER — Inpatient Hospital Stay (HOSPITAL_COMMUNITY)
Admission: RE | Admit: 2016-03-14 | Discharge: 2016-03-15 | DRG: 460 | Disposition: A | Discharge status: Home Health | Payer: Worker's Compensation | Source: Ambulatory Visit | Attending: Neurological Surgery | Admitting: Neurological Surgery

## 2016-03-14 DIAGNOSIS — F329 Major depressive disorder, single episode, unspecified: Secondary | ICD-10-CM | POA: Diagnosis present

## 2016-03-14 DIAGNOSIS — E669 Obesity, unspecified: Secondary | ICD-10-CM | POA: Diagnosis present

## 2016-03-14 DIAGNOSIS — M4806 Spinal stenosis, lumbar region: Principal | ICD-10-CM | POA: Diagnosis present

## 2016-03-14 DIAGNOSIS — Z791 Long term (current) use of non-steroidal anti-inflammatories (NSAID): Secondary | ICD-10-CM

## 2016-03-14 DIAGNOSIS — K219 Gastro-esophageal reflux disease without esophagitis: Secondary | ICD-10-CM | POA: Diagnosis present

## 2016-03-14 DIAGNOSIS — M545 Low back pain: Secondary | ICD-10-CM | POA: Diagnosis present

## 2016-03-14 DIAGNOSIS — G2581 Restless legs syndrome: Secondary | ICD-10-CM | POA: Diagnosis present

## 2016-03-14 DIAGNOSIS — Z7984 Long term (current) use of oral hypoglycemic drugs: Secondary | ICD-10-CM | POA: Diagnosis not present

## 2016-03-14 DIAGNOSIS — N400 Enlarged prostate without lower urinary tract symptoms: Secondary | ICD-10-CM | POA: Diagnosis present

## 2016-03-14 DIAGNOSIS — F17290 Nicotine dependence, other tobacco product, uncomplicated: Secondary | ICD-10-CM | POA: Diagnosis present

## 2016-03-14 DIAGNOSIS — I10 Essential (primary) hypertension: Secondary | ICD-10-CM | POA: Diagnosis present

## 2016-03-14 DIAGNOSIS — Z981 Arthrodesis status: Secondary | ICD-10-CM

## 2016-03-14 DIAGNOSIS — Z6829 Body mass index (BMI) 29.0-29.9, adult: Secondary | ICD-10-CM | POA: Diagnosis not present

## 2016-03-14 DIAGNOSIS — E119 Type 2 diabetes mellitus without complications: Secondary | ICD-10-CM | POA: Diagnosis present

## 2016-03-14 DIAGNOSIS — Z419 Encounter for procedure for purposes other than remedying health state, unspecified: Secondary | ICD-10-CM

## 2016-03-14 LAB — GLUCOSE, CAPILLARY
GLUCOSE-CAPILLARY: 134 mg/dL — AB (ref 65–99)
GLUCOSE-CAPILLARY: 134 mg/dL — AB (ref 65–99)
Glucose-Capillary: 144 mg/dL — ABNORMAL HIGH (ref 65–99)

## 2016-03-14 SURGERY — POSTERIOR LUMBAR FUSION 1 LEVEL
Anesthesia: General | Site: Back

## 2016-03-14 MED ORDER — OXYCODONE-ACETAMINOPHEN 5-325 MG PO TABS
1.0000 | ORAL_TABLET | ORAL | Status: DC | PRN
  Administered 2016-03-14 – 2016-03-15 (×3): 2 via ORAL
  Filled 2016-03-14 (×3): qty 2

## 2016-03-14 MED ORDER — BUPIVACAINE HCL (PF) 0.25 % IJ SOLN
INTRAMUSCULAR | Status: DC | PRN
  Administered 2016-03-14: 3 mL

## 2016-03-14 MED ORDER — VANCOMYCIN HCL 1000 MG IV SOLR
INTRAVENOUS | Status: AC
  Filled 2016-03-14: qty 1000

## 2016-03-14 MED ORDER — ALBUMIN HUMAN 5 % IV SOLN
INTRAVENOUS | Status: DC | PRN
  Administered 2016-03-14 (×2): via INTRAVENOUS

## 2016-03-14 MED ORDER — CEFAZOLIN IN D5W 1 GM/50ML IV SOLN
1.0000 g | Freq: Three times a day (TID) | INTRAVENOUS | Status: AC
  Administered 2016-03-14 – 2016-03-15 (×2): 1 g via INTRAVENOUS
  Filled 2016-03-14 (×3): qty 50

## 2016-03-14 MED ORDER — DEXAMETHASONE SODIUM PHOSPHATE 10 MG/ML IJ SOLN
INTRAMUSCULAR | Status: AC
  Filled 2016-03-14: qty 1

## 2016-03-14 MED ORDER — LIDOCAINE HCL (CARDIAC) 20 MG/ML IV SOLN
INTRAVENOUS | Status: DC | PRN
  Administered 2016-03-14: 80 mg via INTRAVENOUS

## 2016-03-14 MED ORDER — SODIUM CHLORIDE 0.9 % IR SOLN
Status: DC | PRN
  Administered 2016-03-14: 13:00:00

## 2016-03-14 MED ORDER — PROPOFOL 10 MG/ML IV BOLUS
INTRAVENOUS | Status: DC | PRN
  Administered 2016-03-14: 150 mg via INTRAVENOUS
  Administered 2016-03-14: 50 mg via INTRAVENOUS

## 2016-03-14 MED ORDER — CELECOXIB 200 MG PO CAPS
200.0000 mg | ORAL_CAPSULE | Freq: Two times a day (BID) | ORAL | Status: DC
  Administered 2016-03-14 – 2016-03-15 (×2): 200 mg via ORAL
  Filled 2016-03-14 (×2): qty 1

## 2016-03-14 MED ORDER — DUTASTERIDE 0.5 MG PO CAPS
0.5000 mg | ORAL_CAPSULE | Freq: Every day | ORAL | Status: DC
  Filled 2016-03-14: qty 1

## 2016-03-14 MED ORDER — ACETAMINOPHEN 325 MG PO TABS: 650.0000 mg | ORAL_TABLET | ORAL | Status: DC | PRN

## 2016-03-14 MED ORDER — PREGABALIN 75 MG PO CAPS
75.0000 mg | ORAL_CAPSULE | Freq: Two times a day (BID) | ORAL | Status: DC
  Administered 2016-03-14 – 2016-03-15 (×2): 75 mg via ORAL
  Filled 2016-03-14 (×2): qty 1

## 2016-03-14 MED ORDER — SUGAMMADEX SODIUM 500 MG/5ML IV SOLN
INTRAVENOUS | Status: DC | PRN
  Administered 2016-03-14: 432 mg via INTRAVENOUS

## 2016-03-14 MED ORDER — VANCOMYCIN HCL 1000 MG IV SOLR
INTRAVENOUS | Status: DC | PRN
  Administered 2016-03-14: 1000 mg via TOPICAL

## 2016-03-14 MED ORDER — MIDAZOLAM HCL 2 MG/2ML IJ SOLN
INTRAMUSCULAR | Status: AC
Start: 2016-03-14 — End: 2016-03-14
  Filled 2016-03-14: qty 2

## 2016-03-14 MED ORDER — ADULT MULTIVITAMIN W/MINERALS CH
1.0000 | ORAL_TABLET | Freq: Every day | ORAL | Status: DC
  Administered 2016-03-15: 1 via ORAL
  Filled 2016-03-14: qty 1

## 2016-03-14 MED ORDER — ROPINIROLE HCL 1 MG PO TABS
1.0000 mg | ORAL_TABLET | Freq: Every day | ORAL | Status: DC
  Administered 2016-03-14: 1 mg via ORAL
  Filled 2016-03-14: qty 1

## 2016-03-14 MED ORDER — LACTATED RINGERS IV SOLN
INTRAVENOUS | Status: DC
  Administered 2016-03-14 (×4): via INTRAVENOUS

## 2016-03-14 MED ORDER — PHENYLEPHRINE 40 MCG/ML (10ML) SYRINGE FOR IV PUSH (FOR BLOOD PRESSURE SUPPORT)
PREFILLED_SYRINGE | INTRAVENOUS | Status: AC
  Filled 2016-03-14: qty 10

## 2016-03-14 MED ORDER — PROPOFOL 10 MG/ML IV BOLUS
INTRAVENOUS | Status: AC
  Filled 2016-03-14: qty 20

## 2016-03-14 MED ORDER — OXYMORPHONE HCL ER 10 MG PO T12A
10.0000 mg | EXTENDED_RELEASE_TABLET | Freq: Two times a day (BID) | ORAL | Status: DC
  Administered 2016-03-14 – 2016-03-15 (×2): 10 mg via ORAL
  Filled 2016-03-14 (×2): qty 1

## 2016-03-14 MED ORDER — IRBESARTAN 300 MG PO TABS
300.0000 mg | ORAL_TABLET | Freq: Every day | ORAL | Status: DC
  Administered 2016-03-14 – 2016-03-15 (×2): 300 mg via ORAL
  Filled 2016-03-14 (×2): qty 1

## 2016-03-14 MED ORDER — METFORMIN HCL 500 MG PO TABS
500.0000 mg | ORAL_TABLET | Freq: Every day | ORAL | Status: DC
  Administered 2016-03-15: 500 mg via ORAL
  Filled 2016-03-14: qty 1

## 2016-03-14 MED ORDER — 0.9 % SODIUM CHLORIDE (POUR BTL) OPTIME
TOPICAL | Status: DC | PRN
  Administered 2016-03-14: 1000 mL

## 2016-03-14 MED ORDER — TAMSULOSIN HCL 0.4 MG PO CAPS
0.4000 mg | ORAL_CAPSULE | Freq: Every day | ORAL | Status: DC
  Administered 2016-03-15: 0.4 mg via ORAL
  Filled 2016-03-14 (×3): qty 1

## 2016-03-14 MED ORDER — PROMETHAZINE HCL 25 MG/ML IJ SOLN
6.2500 mg | INTRAMUSCULAR | Status: DC | PRN
Start: 2016-03-14 — End: 2016-03-14
  Administered 2016-03-14: 6.25 mg via INTRAVENOUS

## 2016-03-14 MED ORDER — THROMBIN 20000 UNITS EX SOLR
CUTANEOUS | Status: DC | PRN
  Administered 2016-03-14: 13:00:00 via TOPICAL

## 2016-03-14 MED ORDER — ROCURONIUM BROMIDE 50 MG/5ML IV SOLN
INTRAVENOUS | Status: AC
  Filled 2016-03-14: qty 1

## 2016-03-14 MED ORDER — CYCLOBENZAPRINE HCL 10 MG PO TABS
10.0000 mg | ORAL_TABLET | Freq: Three times a day (TID) | ORAL | Status: DC | PRN
  Administered 2016-03-14 – 2016-03-15 (×2): 10 mg via ORAL
  Filled 2016-03-14 (×2): qty 1

## 2016-03-14 MED ORDER — HYDROCHLOROTHIAZIDE 12.5 MG PO CAPS
12.5000 mg | ORAL_CAPSULE | Freq: Every day | ORAL | Status: DC
  Administered 2016-03-14 – 2016-03-15 (×2): 12.5 mg via ORAL
  Filled 2016-03-14 (×2): qty 1

## 2016-03-14 MED ORDER — PHENYLEPHRINE HCL 10 MG/ML IJ SOLN
INTRAMUSCULAR | Status: DC | PRN
  Administered 2016-03-14 (×4): 80 ug via INTRAVENOUS
  Administered 2016-03-14: 120 ug via INTRAVENOUS
  Administered 2016-03-14: 80 ug via INTRAVENOUS

## 2016-03-14 MED ORDER — ONDANSETRON HCL 4 MG/2ML IJ SOLN
INTRAMUSCULAR | Status: AC
  Filled 2016-03-14: qty 2

## 2016-03-14 MED ORDER — PROMETHAZINE HCL 25 MG/ML IJ SOLN
INTRAMUSCULAR | Status: AC
  Filled 2016-03-14: qty 1

## 2016-03-14 MED ORDER — HYDROMORPHONE HCL 1 MG/ML IJ SOLN
INTRAMUSCULAR | Status: AC
  Filled 2016-03-14: qty 1

## 2016-03-14 MED ORDER — ROCURONIUM BROMIDE 100 MG/10ML IV SOLN
INTRAVENOUS | Status: DC | PRN
  Administered 2016-03-14: 10 mg via INTRAVENOUS
  Administered 2016-03-14: 20 mg via INTRAVENOUS
  Administered 2016-03-14: 40 mg via INTRAVENOUS
  Administered 2016-03-14: 50 mg via INTRAVENOUS
  Administered 2016-03-14: 20 mg via INTRAVENOUS

## 2016-03-14 MED ORDER — PHENOL 1.4 % MT LIQD: 1.0000 | OROMUCOSAL | Status: DC | PRN

## 2016-03-14 MED ORDER — SODIUM CHLORIDE 0.9% FLUSH
3.0000 mL | Freq: Two times a day (BID) | INTRAVENOUS | Status: DC
  Administered 2016-03-14: 3 mL via INTRAVENOUS

## 2016-03-14 MED ORDER — MENTHOL 3 MG MT LOZG: 1.0000 | LOZENGE | OROMUCOSAL | Status: DC | PRN

## 2016-03-14 MED ORDER — ONDANSETRON HCL 4 MG/2ML IJ SOLN
INTRAMUSCULAR | Status: DC | PRN
  Administered 2016-03-14: 4 mg via INTRAVENOUS

## 2016-03-14 MED ORDER — PROPOFOL 10 MG/ML IV BOLUS
INTRAVENOUS | Status: AC
Start: 2016-03-14 — End: 2016-03-14
  Filled 2016-03-14: qty 20

## 2016-03-14 MED ORDER — PHENYLEPHRINE HCL 10 MG/ML IJ SOLN
10.0000 mg | INTRAVENOUS | Status: DC | PRN
  Administered 2016-03-14: 25 ug/min via INTRAVENOUS

## 2016-03-14 MED ORDER — FENTANYL CITRATE (PF) 100 MCG/2ML IJ SOLN
INTRAMUSCULAR | Status: DC | PRN
  Administered 2016-03-14 (×4): 50 ug via INTRAVENOUS
  Administered 2016-03-14: 100 ug via INTRAVENOUS
  Administered 2016-03-14 (×4): 50 ug via INTRAVENOUS

## 2016-03-14 MED ORDER — EPHEDRINE SULFATE 50 MG/ML IJ SOLN
INTRAMUSCULAR | Status: DC | PRN
  Administered 2016-03-14 (×5): 10 mg via INTRAVENOUS

## 2016-03-14 MED ORDER — ACETAMINOPHEN 650 MG RE SUPP: 650.0000 mg | RECTAL | Status: DC | PRN

## 2016-03-14 MED ORDER — THROMBIN 5000 UNITS EX SOLR
OROMUCOSAL | Status: DC | PRN
  Administered 2016-03-14: 13:00:00 via TOPICAL

## 2016-03-14 MED ORDER — HYDROMORPHONE HCL 1 MG/ML IJ SOLN
0.2500 mg | INTRAMUSCULAR | Status: DC | PRN
  Administered 2016-03-14: 0.5 mg via INTRAVENOUS

## 2016-03-14 MED ORDER — NALOXEGOL OXALATE 25 MG PO TABS
25.0000 mg | ORAL_TABLET | Freq: Every day | ORAL | Status: DC
  Administered 2016-03-15: 25 mg via ORAL
  Filled 2016-03-14: qty 1

## 2016-03-14 MED ORDER — TELMISARTAN-HCTZ 80-12.5 MG PO TABS: 1.0000 | ORAL_TABLET | Freq: Every day | ORAL | Status: DC

## 2016-03-14 MED ORDER — FENTANYL CITRATE (PF) 250 MCG/5ML IJ SOLN
INTRAMUSCULAR | Status: AC
  Filled 2016-03-14: qty 5

## 2016-03-14 MED ORDER — POTASSIUM CHLORIDE IN NACL 20-0.9 MEQ/L-% IV SOLN
INTRAVENOUS | Status: DC
  Administered 2016-03-14: 19:00:00 via INTRAVENOUS
  Filled 2016-03-14 (×4): qty 1000

## 2016-03-14 MED ORDER — SODIUM CHLORIDE 0.9 % IV SOLN: 250.0000 mL | INTRAVENOUS | Status: DC

## 2016-03-14 MED ORDER — DULOXETINE HCL 60 MG PO CPEP
120.0000 mg | ORAL_CAPSULE | Freq: Every day | ORAL | Status: DC
Start: 2016-03-15 — End: 2016-03-15
  Administered 2016-03-15: 120 mg via ORAL
  Filled 2016-03-14: qty 2

## 2016-03-14 MED ORDER — ONDANSETRON HCL 4 MG/2ML IJ SOLN: 4.0000 mg | INTRAMUSCULAR | Status: DC | PRN

## 2016-03-14 MED ORDER — METOPROLOL SUCCINATE ER 25 MG PO TB24
50.0000 mg | ORAL_TABLET | Freq: Every day | ORAL | Status: DC
  Administered 2016-03-15: 50 mg via ORAL
  Filled 2016-03-14: qty 2

## 2016-03-14 MED ORDER — MORPHINE SULFATE (PF) 2 MG/ML IV SOLN
1.0000 mg | INTRAVENOUS | Status: DC | PRN
  Administered 2016-03-14: 2 mg via INTRAVENOUS
  Filled 2016-03-14: qty 1

## 2016-03-14 MED ORDER — SODIUM CHLORIDE 0.9% FLUSH: 3.0000 mL | INTRAVENOUS | Status: DC | PRN

## 2016-03-14 MED ORDER — SUGAMMADEX SODIUM 500 MG/5ML IV SOLN
INTRAVENOUS | Status: AC
  Filled 2016-03-14: qty 5

## 2016-03-14 MED ORDER — MIDAZOLAM HCL 5 MG/5ML IJ SOLN
INTRAMUSCULAR | Status: DC | PRN
  Administered 2016-03-14: 2 mg via INTRAVENOUS

## 2016-03-14 MED ORDER — PANTOPRAZOLE SODIUM 40 MG PO TBEC
40.0000 mg | DELAYED_RELEASE_TABLET | Freq: Every day | ORAL | Status: DC
Start: 2016-03-15 — End: 2016-03-15
  Administered 2016-03-15: 40 mg via ORAL
  Filled 2016-03-14: qty 1

## 2016-03-14 SURGICAL SUPPLY — 64 items
ADH SKN CLS APL DERMABOND .7 (GAUZE/BANDAGES/DRESSINGS) ×1
APL SKNCLS STERI-STRIP NONHPOA (GAUZE/BANDAGES/DRESSINGS) ×1
BAG DECANTER FOR FLEXI CONT (MISCELLANEOUS) ×3 IMPLANT
BENZOIN TINCTURE PRP APPL 2/3 (GAUZE/BANDAGES/DRESSINGS) ×3 IMPLANT
BLADE CLIPPER SURG (BLADE) ×2 IMPLANT
BONE MATRIX OSTEOCEL PRO MED (Bone Implant) ×2 IMPLANT
BUR MATCHSTICK NEURO 3.0 LAGG (BURR) ×3 IMPLANT
CAGE COROENT MP 8X23 (Cage) ×4 IMPLANT
CANISTER SUCT 3000ML PPV (MISCELLANEOUS) ×3 IMPLANT
CLOSURE WOUND 1/2 X4 (GAUZE/BANDAGES/DRESSINGS) ×1
CONT SPEC 4OZ CLIKSEAL STRL BL (MISCELLANEOUS) ×3 IMPLANT
COVER BACK TABLE 60X90IN (DRAPES) ×3 IMPLANT
DERMABOND ADVANCED (GAUZE/BANDAGES/DRESSINGS) ×2
DERMABOND ADVANCED .7 DNX12 (GAUZE/BANDAGES/DRESSINGS) IMPLANT
DRAPE C-ARM 42X72 X-RAY (DRAPES) ×6 IMPLANT
DRAPE LAPAROTOMY 100X72X124 (DRAPES) ×3 IMPLANT
DRAPE POUCH INSTRU U-SHP 10X18 (DRAPES) ×3 IMPLANT
DRAPE SURG 17X23 STRL (DRAPES) ×3 IMPLANT
DRSG OPSITE 4X5.5 SM (GAUZE/BANDAGES/DRESSINGS) ×2 IMPLANT
DRSG OPSITE POSTOP 4X6 (GAUZE/BANDAGES/DRESSINGS) ×2 IMPLANT
DURAPREP 26ML APPLICATOR (WOUND CARE) ×3 IMPLANT
ELECT BLADE 4.0 EZ CLEAN MEGAD (MISCELLANEOUS) ×3
ELECT REM PT RETURN 9FT ADLT (ELECTROSURGICAL) ×3
ELECTRODE BLDE 4.0 EZ CLN MEGD (MISCELLANEOUS) IMPLANT
ELECTRODE REM PT RTRN 9FT ADLT (ELECTROSURGICAL) ×1 IMPLANT
EVACUATOR 1/8 PVC DRAIN (DRAIN) ×1 IMPLANT
GAUZE SPONGE 4X4 16PLY XRAY LF (GAUZE/BANDAGES/DRESSINGS) IMPLANT
GLOVE BIO SURGEON STRL SZ8 (GLOVE) ×6 IMPLANT
GLOVE BIOGEL PI IND STRL 7.0 (GLOVE) IMPLANT
GLOVE BIOGEL PI IND STRL 7.5 (GLOVE) IMPLANT
GLOVE BIOGEL PI INDICATOR 7.0 (GLOVE) ×8
GLOVE BIOGEL PI INDICATOR 7.5 (GLOVE) ×4
GLOVE SS BIOGEL STRL SZ 7 (GLOVE) IMPLANT
GLOVE SUPERSENSE BIOGEL SZ 7 (GLOVE) ×2
GLOVE SURG SS PI 7.0 STRL IVOR (GLOVE) ×2 IMPLANT
GOWN STRL REUS W/ TWL LRG LVL3 (GOWN DISPOSABLE) IMPLANT
GOWN STRL REUS W/ TWL XL LVL3 (GOWN DISPOSABLE) ×2 IMPLANT
GOWN STRL REUS W/TWL 2XL LVL3 (GOWN DISPOSABLE) IMPLANT
GOWN STRL REUS W/TWL LRG LVL3 (GOWN DISPOSABLE) ×6
GOWN STRL REUS W/TWL XL LVL3 (GOWN DISPOSABLE) ×9
HEMOSTAT POWDER KIT SURGIFOAM (HEMOSTASIS) ×2 IMPLANT
KIT BASIN OR (CUSTOM PROCEDURE TRAY) ×3 IMPLANT
KIT ROOM TURNOVER OR (KITS) ×3 IMPLANT
NDL HYPO 25X1 1.5 SAFETY (NEEDLE) ×1 IMPLANT
NEEDLE HYPO 25X1 1.5 SAFETY (NEEDLE) ×3 IMPLANT
NS IRRIG 1000ML POUR BTL (IV SOLUTION) ×3 IMPLANT
PACK LAMINECTOMY NEURO (CUSTOM PROCEDURE TRAY) ×3 IMPLANT
PAD ARMBOARD 7.5X6 YLW CONV (MISCELLANEOUS) ×9 IMPLANT
RASP 3.0MM (RASP) ×2 IMPLANT
ROD RELINE-O LORD 5.5X40 (Rod) ×4 IMPLANT
SCREW LOCK RELINE 5.5 TULIP (Screw) ×8 IMPLANT
SCREW RELINE-O POLY 6.5X50MM (Screw) ×8 IMPLANT
SPONGE LAP 4X18 X RAY DECT (DISPOSABLE) IMPLANT
SPONGE SURGIFOAM ABS GEL 100 (HEMOSTASIS) ×3 IMPLANT
STRIP CLOSURE SKIN 1/2X4 (GAUZE/BANDAGES/DRESSINGS) ×3 IMPLANT
SUT VIC AB 0 CT1 18XCR BRD8 (SUTURE) ×1 IMPLANT
SUT VIC AB 0 CT1 8-18 (SUTURE) ×6
SUT VIC AB 2-0 CP2 18 (SUTURE) ×3 IMPLANT
SUT VIC AB 3-0 SH 8-18 (SUTURE) ×6 IMPLANT
TOWEL OR 17X24 6PK STRL BLUE (TOWEL DISPOSABLE) ×3 IMPLANT
TOWEL OR 17X26 10 PK STRL BLUE (TOWEL DISPOSABLE) ×3 IMPLANT
TRAP SPECIMEN MUCOUS 40CC (MISCELLANEOUS) ×2 IMPLANT
TRAY FOLEY W/METER SILVER 16FR (SET/KITS/TRAYS/PACK) ×3 IMPLANT
WATER STERILE IRR 1000ML POUR (IV SOLUTION) ×3 IMPLANT

## 2016-03-14 NOTE — Anesthesia Procedure Notes (Signed)
Procedure Name: Intubation Date/Time: 03/14/2016 11:54 AM Performed by: Rejeana Brock L Pre-anesthesia Checklist: Patient identified, Emergency Drugs available, Suction available, Patient being monitored and Timeout performed Patient Re-evaluated:Patient Re-evaluated prior to inductionOxygen Delivery Method: Circle system utilized Preoxygenation: Pre-oxygenation with 100% oxygen Intubation Type: IV induction Ventilation: Mask ventilation without difficulty Laryngoscope Size: Mac and 4 Grade View: Grade I Tube type: Oral Tube size: 7.5 mm Number of attempts: 1 Airway Equipment and Method: Stylet Placement Confirmation: ETT inserted through vocal cords under direct vision,  positive ETCO2 and breath sounds checked- equal and bilateral Secured at: 22 cm Tube secured with: Tape Dental Injury: Teeth and Oropharynx as per pre-operative assessment

## 2016-03-14 NOTE — H&P (Signed)
Subjective: Patient is a 65 y.o. male admitted for plif L2-3. Onset of symptoms was several months ago, gradually worsening since that time.  The pain is rated severe, and is located at the across the lower back and radiates to LEGS. The pain is described as aching and occurs intermittently. The symptoms have been progressive. Symptoms are exacerbated by exercise. He underwent a decompressive laminectomy at L2-3 several months ago. This was adjacent to her previous multilevel fusion. MRI or CT showed residual stenosis. He is now here for further decompression followed by instrumented fusion.   Past Medical History  Diagnosis Date  . DM (diabetes mellitus) ([place])   . Benign neoplasm of colon   . Abnormal involuntary movements(781.0)   . Cough   . Pain in limb   . Restless legs syndrome (RLS)   . Obesity, unspecified   . Unspecified local infection of skin and subcutaneous tissue   . Blisters with epidermal loss due to burn (second degree) of foot   . Tobacco use disorder   . Hypertrophy of prostate with urinary obstruction and other lower urinary tract symptoms (LUTS)   . Spinal stenosis, unspecified region other than cervical   . Loss of weight   . Acute bronchitis   . Allergic rhinitis due to pollen   . Degeneration of lumbar or lumbosacral intervertebral disc   . Diverticulosis of colon (without mention of hemorrhage)   . Esophageal reflux   . Unspecified essential hypertension   . Edema   . Unspecified disease of pericardium   . Varices of other sites   . Sleep disturbance 03/24/2014  . Constipation due to pain medication   . Complication of anesthesia     pt woke up during saphenous vein surgery-had epidural  . Neuromuscular disorder ([place])     peripheral neuropathy    Past Surgical History  Procedure Laterality Date  . Saphenous vein graft resection Right 1991  . Spine surgery  11/2010 & 12/2010  . Tonsillectomy    . Colonoscopy w/ polypectomy    . Lumbar  laminectomy/decompression microdiscectomy Bilateral 09/08/2015    Procedure: Laminectomy and Foraminotomy - Lumbar two-lumbar three bilateral;  Surgeon: Eustace Moore, MD;  Location: [place] [facility];  Service: Neurosurgery;  Laterality: Bilateral;  . Back surgery      Prior to Admission medications   Medication Sig Start Date End Date Taking? Authorizing Provider  Cholecalciferol (VITAMIN D3) 2000 UNITS TABS Take 1 tablet by mouth 2 (two) times daily.   Yes Historical Provider, MD  cyclobenzaprine (FLEXERIL) 10 MG tablet Take 10 mg by mouth 3 (three) times daily.   Yes Historical Provider, MD  DULoxetine (CYMBALTA) 60 MG capsule Take 120 mg by mouth daily.    Yes Historical Provider, MD  dutasteride (AVODART) 0.5 MG capsule Take 0.5 mg by mouth daily.   Yes Historical Provider, MD  ibuprofen (ADVIL,MOTRIN) 800 MG tablet Take 800 mg by mouth 3 (three) times daily. Take every day per patient   Yes Historical Provider, MD  loratadine (CLARITIN) 10 MG tablet Take 10 mg by mouth daily.   Yes Historical Provider, MD  metFORMIN (GLUCOPHAGE) 500 MG tablet Take 500 mg by mouth daily with breakfast.   Yes Historical Provider, MD  metoprolol succinate (TOPROL-XL) 50 MG 24 hr tablet Take 50 mg by mouth daily.  02/22/14  Yes Historical Provider, MD  Multiple Vitamin (MULTIVITAMIN WITH MINERALS) TABS tablet Take 1 tablet by mouth daily.   Yes Historical Provider, MD  naloxegol oxalate (  MOVANTIK) 25 MG TABS tablet Take 25 mg by mouth daily.   Yes Historical Provider, MD  omeprazole (PRILOSEC) 20 MG capsule Take 20 mg by mouth 2 (two) times daily before a meal.    Yes Historical Provider, MD  oxymorphone (OPANA) 10 MG tablet Take 10 mg by mouth 2 (two) times daily.    Yes Historical Provider, MD  pregabalin (LYRICA) 75 MG capsule Take 75 mg by mouth 2 (two) times daily.   Yes Historical Provider, MD  rOPINIRole (REQUIP) 1 MG tablet Take 1 mg by mouth at bedtime.   Yes Historical Provider, MD  tamsulosin (FLOMAX) 0.4  MG CAPS capsule Take 0.4 mg by mouth daily.   Yes Historical Provider, MD  telmisartan-hydrochlorothiazide (MICARDIS HCT) 80-12.5 MG per tablet Take 1 tablet by mouth daily.   Yes Historical Provider, MD   Allergies  Allergen Reactions  . Avelox [Moxifloxacin] Other (See Comments)    Angioedema Fatigue  . Quinolones Other (See Comments)    Angioedema  . Lexapro [Escitalopram] Other (See Comments)    unknown    Social History  Substance Use Topics  . Smoking status: Current Every Day Smoker -- 40 years    Types: Pipe  . Smokeless tobacco: Not on file     Comment: moderate  . Alcohol Use: No    Family History  Problem Relation Age of Onset  . Cancer Mother   . Heart failure Father   . Tuberculosis Maternal Uncle   . Cancer - Colon Maternal Uncle   . Hypertension Paternal Grandfather      Review of Systems  Positive ROS: NEG  All other systems have been reviewed and were otherwise negative with the exception of those mentioned in the HPI and as above.  Objective: Vital signs in last 24 hours: Temp:  [98.3 F (36.8 C)] 98.3 F (36.8 C) (06/21 1042) Resp:  [20] 20 (06/21 1042) BP: (175)/(86) 175/86 mmHg (06/21 1042) SpO2:  [95 %] 95 % (06/21 1042) Weight:  [107.956 kg (238 lb)] 107.956 kg (238 lb) (06/21 1042)  General Appearance: Alert, cooperative, no distress, appears stated age Head: Normocephalic, without obvious abnormality, atraumatic Eyes: PERRL, conjunctiva/corneas clear, EOM's intact    Neck: Supple, symmetrical, trachea midline Back: Symmetric, no curvature, ROM normal, no CVA tenderness Lungs:  respirations unlabored Heart: Regular rate and rhythm Abdomen: Soft, non-tender Extremities: Extremities normal, atraumatic, no cyanosis or edema Pulses: 2+ and symmetric all extremities Skin: Skin color, texture, turgor normal, no rashes or lesions  NEUROLOGIC:   Mental status: Alert and oriented x4,  no aphasia, good attention span, fund of knowledge, and  memory Motor Exam - mild weakness of the distal lower extremities with pill rolling tremor on right Sensory Exam - decreased in a stocking distribution Reflexes: Absent Coordination - grossly normal Gait - ataxic or antalgic Balance - grossly normal Cranial Nerves: I: smell Not tested  II: visual acuity  OS: nl    OD: nl  II: visual fields Full to confrontation  II: pupils Equal, round, reactive to light  III,VII: ptosis None  III,IV,VI: extraocular muscles  Full ROM  V: mastication Normal  V: facial light touch sensation  Normal  V,VII: corneal reflex  Present  VII: facial muscle function - upper  Normal  VII: facial muscle function - lower Normal  VIII: hearing Not tested  IX: soft palate elevation  Normal  IX,X: gag reflex Present  XI: trapezius strength  5/5  XI: sternocleidomastoid strength 5/5  XI: neck  flexion strength  5/5  XII: tongue strength  Normal    Data Review Lab Results  Component Value Date   WBC 8.6 03/07/2016   HGB 13.3 03/07/2016   HCT 40.5 03/07/2016   MCV 89.8 03/07/2016   PLT 244 03/07/2016   Lab Results  Component Value Date   NA 137 03/07/2016   K 4.0 03/07/2016   CL 102 03/07/2016   CO2 28 03/07/2016   BUN 17 03/07/2016   CREATININE 0.81 03/07/2016   GLUCOSE 103* 03/07/2016   Lab Results  Component Value Date   INR 1.17 03/07/2016    Assessment/Plan: Patient admitted for Repeat Decompression and instrumented fusion L2-3. Patient has failed a reasonable attempt at conservative therapy.  I explained the condition and procedure to the patient and answered any questions.  Patient wishes to proceed with procedure as planned. Understands risks/ benefits and typical outcomes of procedure.   Breannah Kratt S 03/14/2016 11:34 AM

## 2016-03-14 NOTE — Op Note (Signed)
03/14/2016  3:36 PM  PATIENT:  Douglas Price  65 y.o. 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 Decompressive lumbar laminectomy L2-3 requiring more work than would be required for a simple exposure of the disk for PLIF in order to adequately decompress the neural elements and address the spinal stenosis 2. Posterior lumbar interbody fusion L2-3 using PEEK interbody cages packed with morcellized allograft and autograft 3. Posterior fixation L2-3  using nuvasive screws.  4. Intertransverse arthrodesis L2-3 using morcellized autograft and allograft.  SURGEON:  Sherley Bounds, MD  ASSISTANTS: Dr. Cyndy Freeze  ANESTHESIA:  General  EBL: 400 ml  Total I/O In: 2500 [I.V.:2000; IV Piggyback:500] Out: 750 [Urine:350; Blood:400]  BLOOD ADMINISTERED:none  DRAINS: Hemovac   INDICATION FOR PROCEDURE: This patient had a decompressive laminectomy at L2-3 for adjacent level stenosis 6 months ago. He had a return of his right leg pain. MRI showed residual stenosis. I recommended  decompression and instrument effusion. Patient understood the risks, benefits, and alternatives and potential outcomes and wished to proceed.  PROCEDURE DETAILS:  The patient was brought to the operating room. After induction of generalized endotracheal anesthesia the patient was rolled into the prone position on chest rolls and all pressure points were padded. The patient's lumbar region was cleaned and then prepped with DuraPrep and draped in the usual sterile fashion. Anesthesia was injected and then a dorsal midline incision was made and carried down to the lumbosacral fascia. The fascia was opened and the paraspinous musculature was taken down in a subperiosteal fashion to expose L2-3. A self-retaining retractor was placed. Intraoperative fluoroscopy confirmed my level, and I started with removal of the L3 screws. I  used the high-speed drill to cut the rod between the L3 and L4 pedicle screws. I removed the locking And removed the rod. I then removed the L3 pedicle screws.  I then turned my attention to the decompression and the spinous process was removed and complete lumbar laminectomies, hemi- facetectomies, and foraminotomies were performed at L2-3. The patient had significant spinal stenosis and this required more work than would be required for a simple exposure of the disc for posterior lumbar interbody fusion. There was significant scarring and I took great care to avoid unintended durotomy. At no time did I see CSF. Much more generous decompression was undertaken in order to adequately decompress the neural elements and address the patient's leg pain. The yellow ligament was removed to expose the underlying dura and nerve roots, and generous foraminotomies were performed to adequately decompress the neural elements. Both the exiting and traversing nerve roots were decompressed on both sides until a coronary dilator passed easily along the nerve roots. Once the decompression was complete, I turned my attention to the posterior lower lumbar interbody fusion. The epidural venous vasculature was coagulated and cut sharply. Disc space was incised and the initial discectomy was performed with pituitary rongeurs. The disc space was distracted with sequential distractors to a height of 8 mm. We then used a series of scrapers and shavers to prepare the endplates for fusion. The midline was prepared with Epstein curettes. Once the complete discectomy was finished, we packed an appropriate sized peek interbody cage with local autograft and morcellized allograft, gently retracted the nerve root, and tapped the cage into position at L2-3.  The midline between the cages was packed with morselized autograft and allograft. We then turned our  attention to the placement of the pedicle screws. 6.5 x 50 mm pedicle screws were placed into  the old pedicle holes at L3. The pedicle screw entry zones at L2 were identified utilizing surface landmarks and fluoroscopy. I probed each pedicle with the pedicle probe and then tapped each pedicle with a 5.5 tap.. We palpated with a ball probe to assure no break in the cortex. We then placed 6.5 x 50 mm pedicle screws into the pedicles bilaterally at L2. We then decorticated the transverse processes and laid a mixture of morcellized autograft and allograft out over these to perform intertransverse arthrodesis at L2-3. We then placed lordotic rods into the multiaxial screw heads of the pedicle screws and locked these in position with the locking caps and anti-torque device. We then checked our construct with AP and lateral fluoroscopy. Irrigated with copious amounts of bacitracin-containing saline solution. Placed a medium Hemovac drain through separate stab incision. Inspected the nerve roots once again to assure adequate decompression, lined to the dura with Gelfoam, and closed the muscle and the fascia with 0 Vicryl. Closed the subcutaneous tissues with 2-0 Vicryl and subcuticular tissues with 3-0 Vicryl. The skin was closed with benzoin and Steri-Strips. Dressing was then applied, the patient was awakened from general anesthesia and transported to the recovery room in stable condition. At the end of the procedure all sponge, needle and instrument counts were correct.   PLAN OF CARE: Admit to inpatient   PATIENT DISPOSITION:  PACU - hemodynamically stable.   Delay start of Pharmacological VTE agent (>24hrs) due to surgical blood loss or risk of bleeding:  yes

## 2016-03-14 NOTE — Progress Notes (Signed)
Pt arrived to 5C08 via stretcher.  Pt alert and oriented in no apparent distress.  VSS.  Will continue to monitor.  Cori Razor, RN

## 2016-03-14 NOTE — Anesthesia Preprocedure Evaluation (Addendum)
Anesthesia Evaluation  Patient identified by MRN, date of birth, ID band Patient awake    Reviewed: Allergy & Precautions, NPO status , Patient's Chart, lab work & pertinent test results, reviewed documented beta blocker date and time   Airway Mallampati: II  TM Distance: >3 FB Neck ROM: Full    Dental no notable dental hx. (+) Dental Advisory Given, Teeth Intact   Pulmonary Current Smoker,    Pulmonary exam normal breath sounds clear to auscultation       Cardiovascular hypertension, Pt. on medications and Pt. on home beta blockers Normal cardiovascular exam Rhythm:Regular Rate:Normal     Neuro/Psych PSYCHIATRIC DISORDERS Depression negative neurological ROS     GI/Hepatic Neg liver ROS, GERD  ,  Endo/Other  negative endocrine ROSdiabetes, Type 2  Renal/GU negative Renal ROS     Musculoskeletal  (+) Arthritis ,   Abdominal (+)  Abdomen: soft. Bowel sounds: normal.  Peds  Hematology negative hematology ROS (+)   Anesthesia Other Findings   Reproductive/Obstetrics negative OB ROS                           Lab Results  Component Value Date   WBC 8.6 03/07/2016   HGB 13.3 03/07/2016   HCT 40.5 03/07/2016   MCV 89.8 03/07/2016   PLT 244 03/07/2016   Lab Results  Component Value Date   CREATININE 0.81 03/07/2016   BUN 17 03/07/2016   NA 137 03/07/2016   K 4.0 03/07/2016   CL 102 03/07/2016   CO2 28 03/07/2016    Anesthesia Physical  Anesthesia Plan  ASA: II  Anesthesia Plan: General   Post-op Pain Management:    Induction: Intravenous  Airway Management Planned: Oral ETT  Additional Equipment:   Intra-op Plan:   Post-operative Plan: Extubation in OR  Informed Consent: I have reviewed the patients History and Physical, chart, labs and discussed the procedure including the risks, benefits and alternatives for the proposed anesthesia with the patient or authorized  representative who has indicated his/her understanding and acceptance.   Dental advisory given  Plan Discussed with: CRNA  Anesthesia Plan Comments:         Anesthesia Quick Evaluation

## 2016-03-14 NOTE — Transfer of Care (Signed)
Immediate Anesthesia Transfer of Care Note  Patient: Douglas Price  Procedure(s) Performed: Procedure(s): Lumbar two-three Posterior Lumbar Interbody Fusion (N/A)  Patient Location: PACU  Anesthesia Type:General  Level of Consciousness: awake and alert   Airway & Oxygen Therapy: Patient Spontanous Breathing and Patient connected to nasal cannula oxygen  Post-op Assessment: Report given to RN and Post -op Vital signs reviewed and stable  Post vital signs: Reviewed and stable  Last Vitals:  Filed Vitals:   03/14/16 1042 03/14/16 1548  BP: 175/86   Pulse:  99  Temp: 36.8 C 36.1 C  Resp: 20     Last Pain: There were no vitals filed for this visit.       Complications: No apparent anesthesia complications

## 2016-03-15 LAB — GLUCOSE, CAPILLARY
GLUCOSE-CAPILLARY: 117 mg/dL — AB (ref 65–99)
Glucose-Capillary: 115 mg/dL — ABNORMAL HIGH (ref 65–99)

## 2016-03-15 MED ORDER — OXYMORPHONE HCL 10 MG PO TABS: 10.0000 mg | ORAL_TABLET | Freq: Two times a day (BID) | ORAL | Status: DC

## 2016-03-15 MED ORDER — CYCLOBENZAPRINE HCL 10 MG PO TABS: 10.0000 mg | ORAL_TABLET | Freq: Three times a day (TID) | ORAL | Status: DC | PRN

## 2016-03-15 NOTE — Care Management Note (Addendum)
Case Management Note  Patient Details  Name: Douglas Price MRN: [id number] Date of Birth: 08/09/1951  Subjective/Objective: 65 y.o. M admitted 03/14/2016 for Decompressive Lumbar Laminectomy L 2-3, PLIFL2-3, Posterior Fix L2-3. Pt lives alone in a [place]-[address] [place]-Home.  Pt provided this CM with all WC info. Workmans Comp Case Manager is Janeece Fitting, RN with [facility]  [address] [place], [place] [address] and can be reached by phone @ [phone]. Office: [phone]  And her fax is [phone]. She asked that we send MD orders for HHOT/PT and DME ASAP so that she can pursue authorization. She also asked that we provide him with DME from our vendor so that he can go home with the DME that he needs at discharge. Will need discharge summary when pt is actually discharged from the hoslpital                    Action/Plan: Faxed all HHPT/OT and DME orders to [facility] # listed above as requested.   Anticipate discharge home Friday. No further CM needs but will be available should additional discharge needs arise.   Expected Discharge Date:                  Expected Discharge Plan:  [place]  In-House Referral:     Discharge planning Services  CM Consult (Workers Comp)  Post Acute Care Choice:  Durable Medical Equipment Choice offered to:  Patient  DME Arranged:  3-N-1, Walker rolling DME Agency:     HH Arranged:  PT, OT HH Agency:     Status of Service:  In process, will continue to follow  If discussed at Long Length of Stay Meetings, dates discussed:    Additional Comments:  Delrae Sawyers, RN 03/15/2016, 11:30 AM

## 2016-03-15 NOTE — Anesthesia Postprocedure Evaluation (Signed)
Anesthesia Post Note  Patient: Douglas Price  Procedure(s) Performed: Procedure(s) (LRB): Lumbar two-three Posterior Lumbar Interbody Fusion (N/A)  Patient location during evaluation: PACU Anesthesia Type: General Level of consciousness: awake and alert Pain management: pain level controlled Vital Signs Assessment: post-procedure vital signs reviewed and stable Respiratory status: spontaneous breathing, nonlabored ventilation, respiratory function stable and patient connected to nasal cannula oxygen Cardiovascular status: blood pressure returned to baseline and stable Postop Assessment: no signs of nausea or vomiting Anesthetic complications: no    Last Vitals:  Filed Vitals:   03/15/16 0612 03/15/16 0816  BP: 117/72 112/66  Pulse: 79 84  Temp: 36.6 C 36.6 C  Resp: 17 16    Last Pain:  Filed Vitals:   03/15/16 1031  PainSc: Kamrar, Ponciano Shealy E

## 2016-03-15 NOTE — Discharge Summary (Signed)
Physician Discharge Summary  Patient ID: Douglas Price MRN: [id number] DOB/AGE: Dec 07, 1950 65 y.o.  Admit date: 03/14/2016 Discharge date: 03/15/2016  Admission Diagnoses: adjacent level stenosis   Discharge Diagnoses: same   Discharged Condition: good  Hospital Course: The patient was admitted on 03/14/2016 and taken to the operating room where the patient underwent PLIF L2-3. The patient tolerated the procedure well and was taken to the recovery room and then to the floor in stable condition. The hospital course was routine. There were no complications. The wound remained clean dry and intact. Pt had appropriate  soreness. No complaints of leg pain or new N/T/W. The patient remained afebrile with stable vital signs, and tolerated a regular diet. The patient continued to increase activities, and pain was well controlled with oral pain medications.   Consults: None  Significant Diagnostic Studies:  Results for orders placed or performed during the hospital encounter of 03/14/16  Glucose, capillary  Result Value Ref Range   Glucose-Capillary 134 (H) 65 - 99 mg/dL   Comment 1 Notify RN   Glucose, capillary  Result Value Ref Range   Glucose-Capillary 134 (H) 65 - 99 mg/dL  Glucose, capillary  Result Value Ref Range   Glucose-Capillary 144 (H) 65 - 99 mg/dL   Comment 1 Notify RN    Comment 2 Document in Chart   Glucose, capillary  Result Value Ref Range   Glucose-Capillary 115 (H) 65 - 99 mg/dL   Comment 1 Notify RN    Comment 2 Document in Chart     Dg Lumbar Spine 2-3 Views  03/14/2016  CLINICAL DATA:  Post L2-L3 level lumbar fusion EXAM: LUMBAR SPINE - 2-3 VIEW; DG C-ARM 61-120 MIN COMPARISON:  Lumbar spine x-ray 09/08/2015. FINDINGS: Two views of the lumbar spine submitted. Again noted partially visualized posterior fusion at L3-L4 L5 level. Right lateral screws are noted L3 and L4 vertebral body. There is new posterior fusion with transpedicular screws and metallic rods at  123XX123 level. Disc spacer material noted at L2-L3 level. IMPRESSION: Again noted partially visualized posterior fusion at L3-L4 L5 level. Right lateral screws are noted L3 and L4 vertebral body. There is new posterior fusion with transpedicular screws and metallic rods at 123XX123 level. Disc spacer material noted at L2-L3 level. Fluoroscopy time was 59 seconds.  Please see the operative report. Electronically Signed   By: Lahoma Crocker M.D.   On: 03/14/2016 16:24   Dg C-arm 61-120 Min  03/14/2016  CLINICAL DATA:  Post L2-L3 level lumbar fusion EXAM: LUMBAR SPINE - 2-3 VIEW; DG C-ARM 61-120 MIN COMPARISON:  Lumbar spine x-ray 09/08/2015. FINDINGS: Two views of the lumbar spine submitted. Again noted partially visualized posterior fusion at L3-L4 L5 level. Right lateral screws are noted L3 and L4 vertebral body. There is new posterior fusion with transpedicular screws and metallic rods at 123XX123 level. Disc spacer material noted at L2-L3 level. IMPRESSION: Again noted partially visualized posterior fusion at L3-L4 L5 level. Right lateral screws are noted L3 and L4 vertebral body. There is new posterior fusion with transpedicular screws and metallic rods at 123XX123 level. Disc spacer material noted at L2-L3 level. Fluoroscopy time was 59 seconds.  Please see the operative report. Electronically Signed   By: Lahoma Crocker M.D.   On: 03/14/2016 16:24    Antibiotics:  Anti-infectives    Start     Dose/Rate Route Frequency Ordered Stop   03/14/16 1900  ceFAZolin (ANCEF) IVPB 1 g/50 mL premix     1  g 100 mL/hr over 30 Minutes Intravenous Every 8 hours 03/14/16 1805 03/15/16 0708   03/14/16 1249  bacitracin 50,000 Units in sodium chloride irrigation 0.9 % 500 mL irrigation  Status:  Discontinued       As needed 03/14/16 1249 03/14/16 1546   03/14/16 1248  vancomycin (VANCOCIN) powder  Status:  Discontinued       As needed 03/14/16 1248 03/14/16 1546   03/14/16 1138  vancomycin (VANCOCIN) 1000 MG powder    Comments:   Loreli Dollar   : cabinet override      03/14/16 1138 03/14/16 2344   03/14/16 0900  ceFAZolin (ANCEF) IVPB 2g/100 mL premix     2 g 200 mL/hr over 30 Minutes Intravenous To [facility] 03/13/16 1241 03/14/16 1205      Discharge Exam: Blood pressure 112/66, pulse 84, temperature 97.9 F (36.6 C), temperature source Oral, resp. rate 16, height 6\' 3"  (1.905 m), weight 107.956 kg (238 lb), SpO2 97 %. Neurologic: Grossly normal Dressing dry  Discharge Medications:     Medication List    STOP taking these medications        ibuprofen 800 MG tablet  Commonly known as:  ADVIL,MOTRIN      TAKE these medications        cyclobenzaprine 10 MG tablet  Commonly known as:  FLEXERIL  Take 1 tablet (10 mg total) by mouth 3 (three) times daily as needed for muscle spasms.     DULoxetine 60 MG capsule  Commonly known as:  CYMBALTA  Take 120 mg by mouth daily.     dutasteride 0.5 MG capsule  Commonly known as:  AVODART  Take 0.5 mg by mouth daily.     loratadine 10 MG tablet  Commonly known as:  CLARITIN  Take 10 mg by mouth daily.     metFORMIN 500 MG tablet  Commonly known as:  GLUCOPHAGE  Take 500 mg by mouth daily with breakfast.     metoprolol succinate 50 MG 24 hr tablet  Commonly known as:  TOPROL-XL  Take 50 mg by mouth daily.     MOVANTIK 25 MG Tabs tablet  Generic drug:  naloxegol oxalate  Take 25 mg by mouth daily.     multivitamin with minerals Tabs tablet  Take 1 tablet by mouth daily.     omeprazole 20 MG capsule  Commonly known as:  PRILOSEC  Take 20 mg by mouth 2 (two) times daily before a meal.     oxymorphone 10 MG tablet  Commonly known as:  OPANA  Take 1 tablet (10 mg total) by mouth 2 (two) times daily.     pregabalin 75 MG capsule  Commonly known as:  LYRICA  Take 75 mg by mouth 2 (two) times daily.     rOPINIRole 1 MG tablet  Commonly known as:  REQUIP  Take 1 mg by mouth at bedtime.     tamsulosin 0.4 MG Caps capsule  Commonly  known as:  FLOMAX  Take 0.4 mg by mouth daily.     telmisartan-hydrochlorothiazide 80-12.5 MG tablet  Commonly known as:  MICARDIS HCT  Take 1 tablet by mouth daily.     Vitamin D3 2000 units Tabs  Take 1 tablet by mouth 2 (two) times daily.        Disposition: home   Final Dx: PLIF L2-3      Discharge Instructions     Remove dressing in 72 hours    Complete by:  As  directed      Call MD for:  difficulty breathing, headache or visual disturbances    Complete by:  As directed      Call MD for:  persistant nausea and vomiting    Complete by:  As directed      Call MD for:  redness, tenderness, or signs of infection (pain, swelling, redness, odor or green/yellow discharge around incision site)    Complete by:  As directed      Call MD for:  severe uncontrolled pain    Complete by:  As directed      Call MD for:  temperature >100.4    Complete by:  As directed      Diet - low sodium heart healthy    Complete by:  As directed      Discharge instructions    Complete by:  As directed   May shower, no heavy lifting, no bending or twisting     Increase activity slowly    Complete by:  As directed            Follow-up Information    Follow up with JONES,DAVID S, MD. Schedule an appointment as soon as possible for a visit in 3 weeks.   Specialty:  Neurosurgery   Contact information:   [address] [place] [address] [place] [address] [phone]        Signed: Eustace Moore 03/15/2016, 10:33 AM

## 2016-03-15 NOTE — Discharge Instructions (Signed)
Spinal Fusion, Care After °Refer to this sheet in the next few weeks. These instructions provide you with information on caring for yourself after your procedure. Your caregiver may also give you more specific instructions. Your treatment has been planned according to current medical practices, but problems sometimes occur. Call your caregiver if you have any problems or questions after your procedure. °HOME CARE INSTRUCTIONS  °· Take whatever pain medicine has been prescribed by your caregiver. Do not take over-the-counter pain medicine unless directed otherwise by your caregiver. °· Do not drive if you are taking narcotic pain medicines. °· Change your bandage (dressing) if necessary or as directed by your caregiver. °· Do not get your surgical cut (incision) wet. After a few days you may take quick showers (rather than baths), but keep your incision clean and dry. Covering the incision with plastic wrap while you shower should keep your incision dry. A few weeks after surgery, once your incision has healed and your caregiver says it is okay, you can take baths or go swimming. °· If you have been prescribed medicine to prevent your blood from clotting, follow the directions carefully. °· Check the area around your incision often. Look for redness and swelling. Also, look for anything leaking from your wound. You can use a mirror or have a family member inspect your incision if it is in a place where it is difficult for you to see. °· Ask your caregiver what activities you should avoid and for how long. °· Walk as much as possible. °· Do not lift anything heavier than 10 pounds (4.5 kilograms) until your caregiver says it is safe. °· Do not twist or bend for a few weeks. Try not to pull on things. Avoid sitting for long periods of time. Change positions at least every hour. °· Ask your caregiver what kinds of exercise you should do to make your back stronger and when you should begin doing these exercises. °SEEK  IMMEDIATE MEDICAL CARE IF:  °· Pain suddenly becomes much worse. °· The incision area is red, swollen, bleeding, or leaking fluid. °· Your legs or feet become increasingly painful, numb, weak, or swollen. °· You have trouble controlling urination or bowel movements. °· You have trouble breathing. °· You have chest pain. °· You have a fever. °MAKE SURE YOU: °· Understand these instructions. °· Will watch your condition. °· Will get help right away if you are not doing well or get worse. °  °This information is not intended to replace advice given to you by your health care provider. Make sure you discuss any questions you have with your health care provider. °  °Document Released: 03/30/2005 Document Revised: 10/01/2014 Document Reviewed: 02/23/2015 °Elsevier Interactive Patient Education ©2016 [employer] ° °
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
## 2016-03-15 NOTE — Care Management Note (Signed)
Case Management Note  Patient Details  Name: Douglas Price MRN: [id number] Date of Birth: 08/10/1951  Subjective/Objective:                    Action/Plan:   Expected Discharge Date:                  Expected Discharge Plan:  [place]  In-House Referral:     Discharge planning Services  CM Consult (Workers Comp)  Post Acute Care Choice:  Durable Medical Equipment Choice offered to:  Patient  DME Arranged:  3-N-1, Walker rolling DME Agency:     HH Arranged:  PT, OT HH Agency:     Status of Service:  In process, will continue to follow  If discussed at Long Length of Stay Meetings, dates discussed:    Additional Comments:  Delrae Sawyers, RN 03/15/2016, 11:49 AM

## 2016-03-15 NOTE — Progress Notes (Signed)
Faxed copy of discharge Summary to Workers Comp Case Freight forwarder per her request. No further CM needs. Will sign off.

## 2016-03-15 NOTE — Progress Notes (Signed)
RN discussed discharge instructions with patient and family including follow up appointments, medications, constipation prevention. Discharge instructions and medications given to patient. Neuro assessment unchanged. Foley was discontinued, PVR showed 295cc, patient denies any pressure or distention, Dr. Ronnald Ramp aware. Patient waiting for equipment. Will continue to monitor until patient ready for discharge.
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
## 2017-01-28 ENCOUNTER — Other Ambulatory Visit: Payer: Self-pay | Admitting: Internal Medicine

## 2017-01-28 ENCOUNTER — Other Ambulatory Visit: Payer: Self-pay

## 2017-01-28 ENCOUNTER — Ambulatory Visit (HOSPITAL_COMMUNITY): Payer: BC Managed Care – PPO | Attending: Cardiology

## 2017-01-28 DIAGNOSIS — R55 Syncope and collapse: Secondary | ICD-10-CM | POA: Diagnosis not present

## 2017-01-28 DIAGNOSIS — I129 Hypertensive chronic kidney disease with stage 1 through stage 4 chronic kidney disease, or unspecified chronic kidney disease: Secondary | ICD-10-CM | POA: Diagnosis not present

## 2017-03-23 IMAGING — CR DG CHEST 2V
2 series · 2 of 2 positions shown · non-contrast
Comparison: [DATE]

CLINICAL DATA: Diabetes and hypertension. Preoperative evaluation
for lumbar spinal stenosis.

EXAM:
CHEST  2 VIEW

[w chest pa]
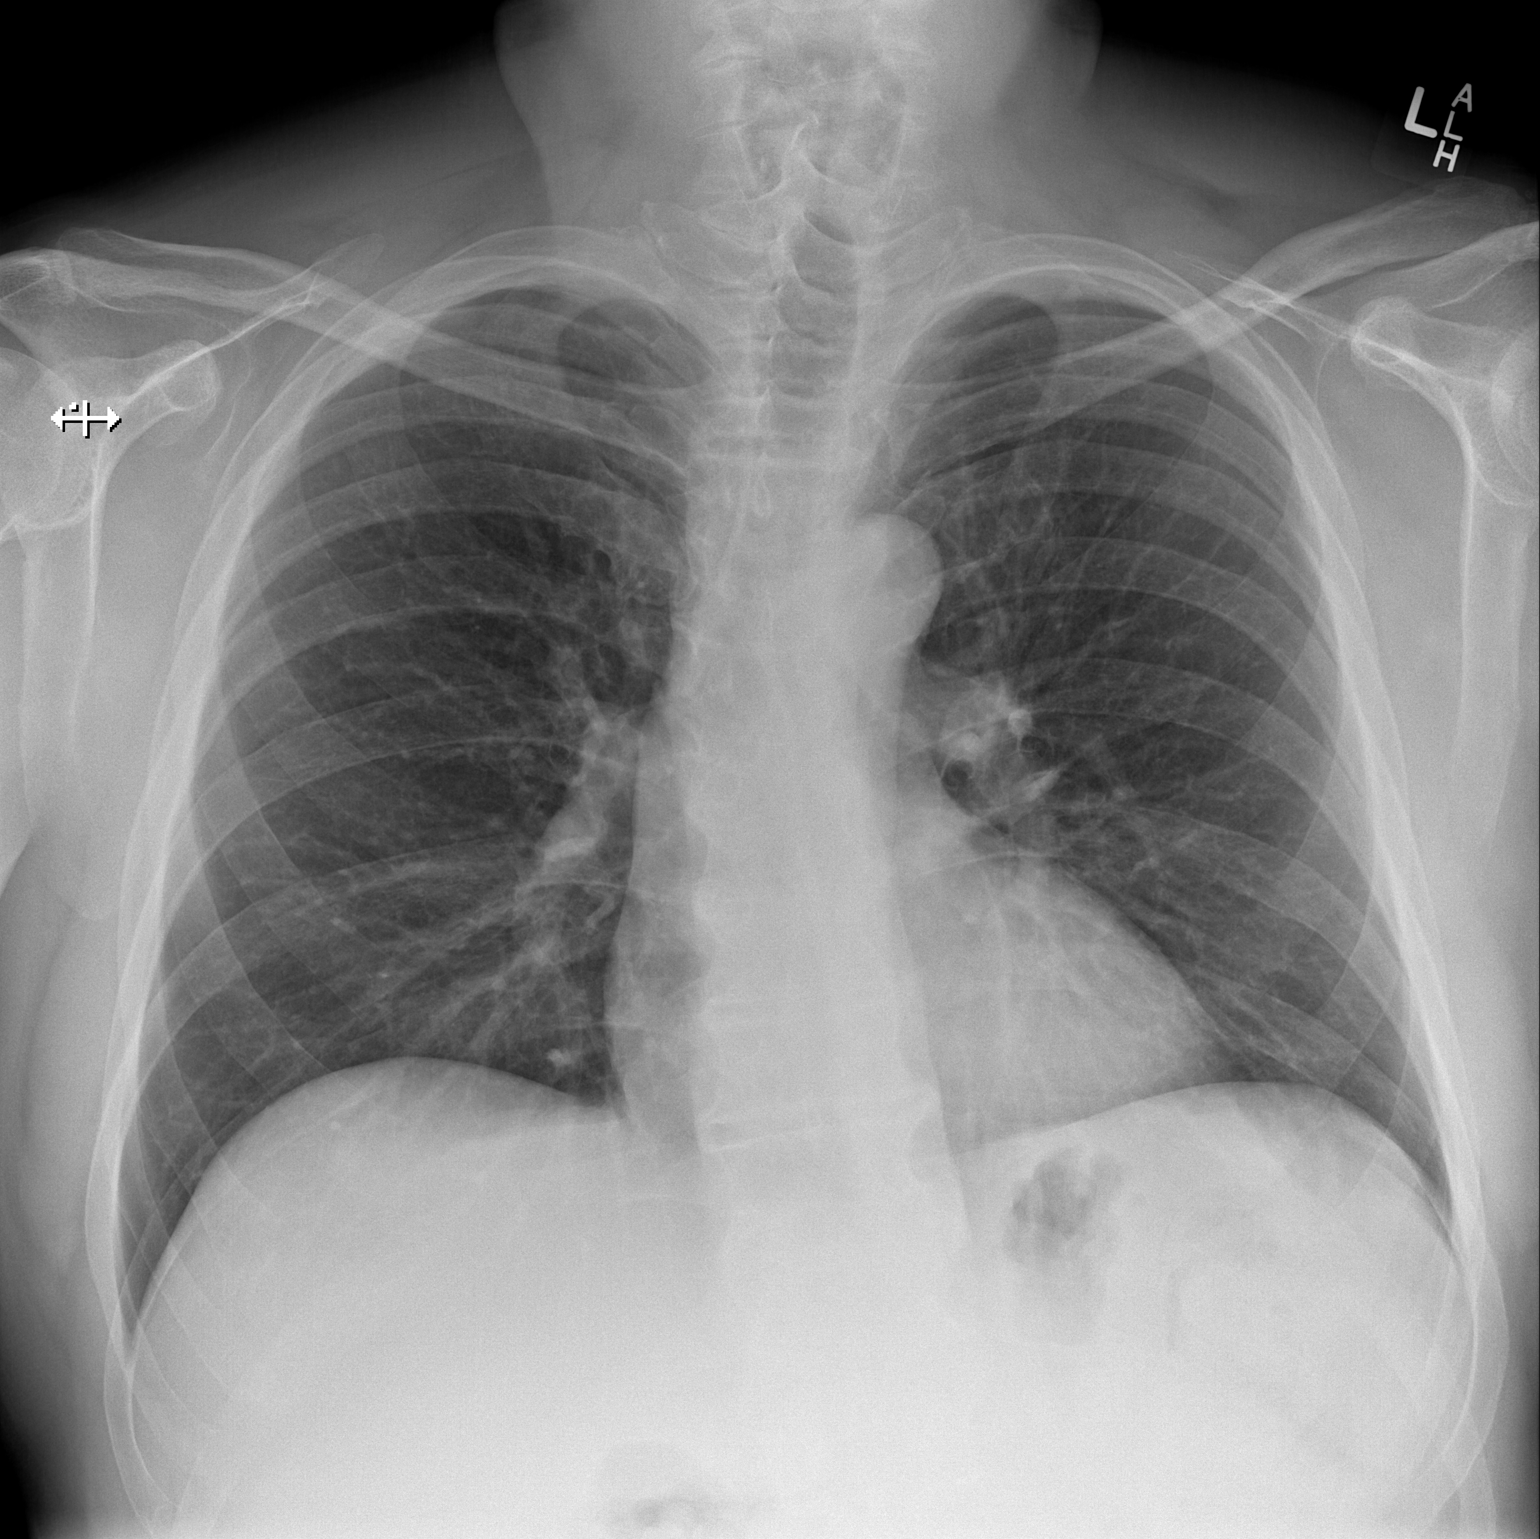

[w chest lat]
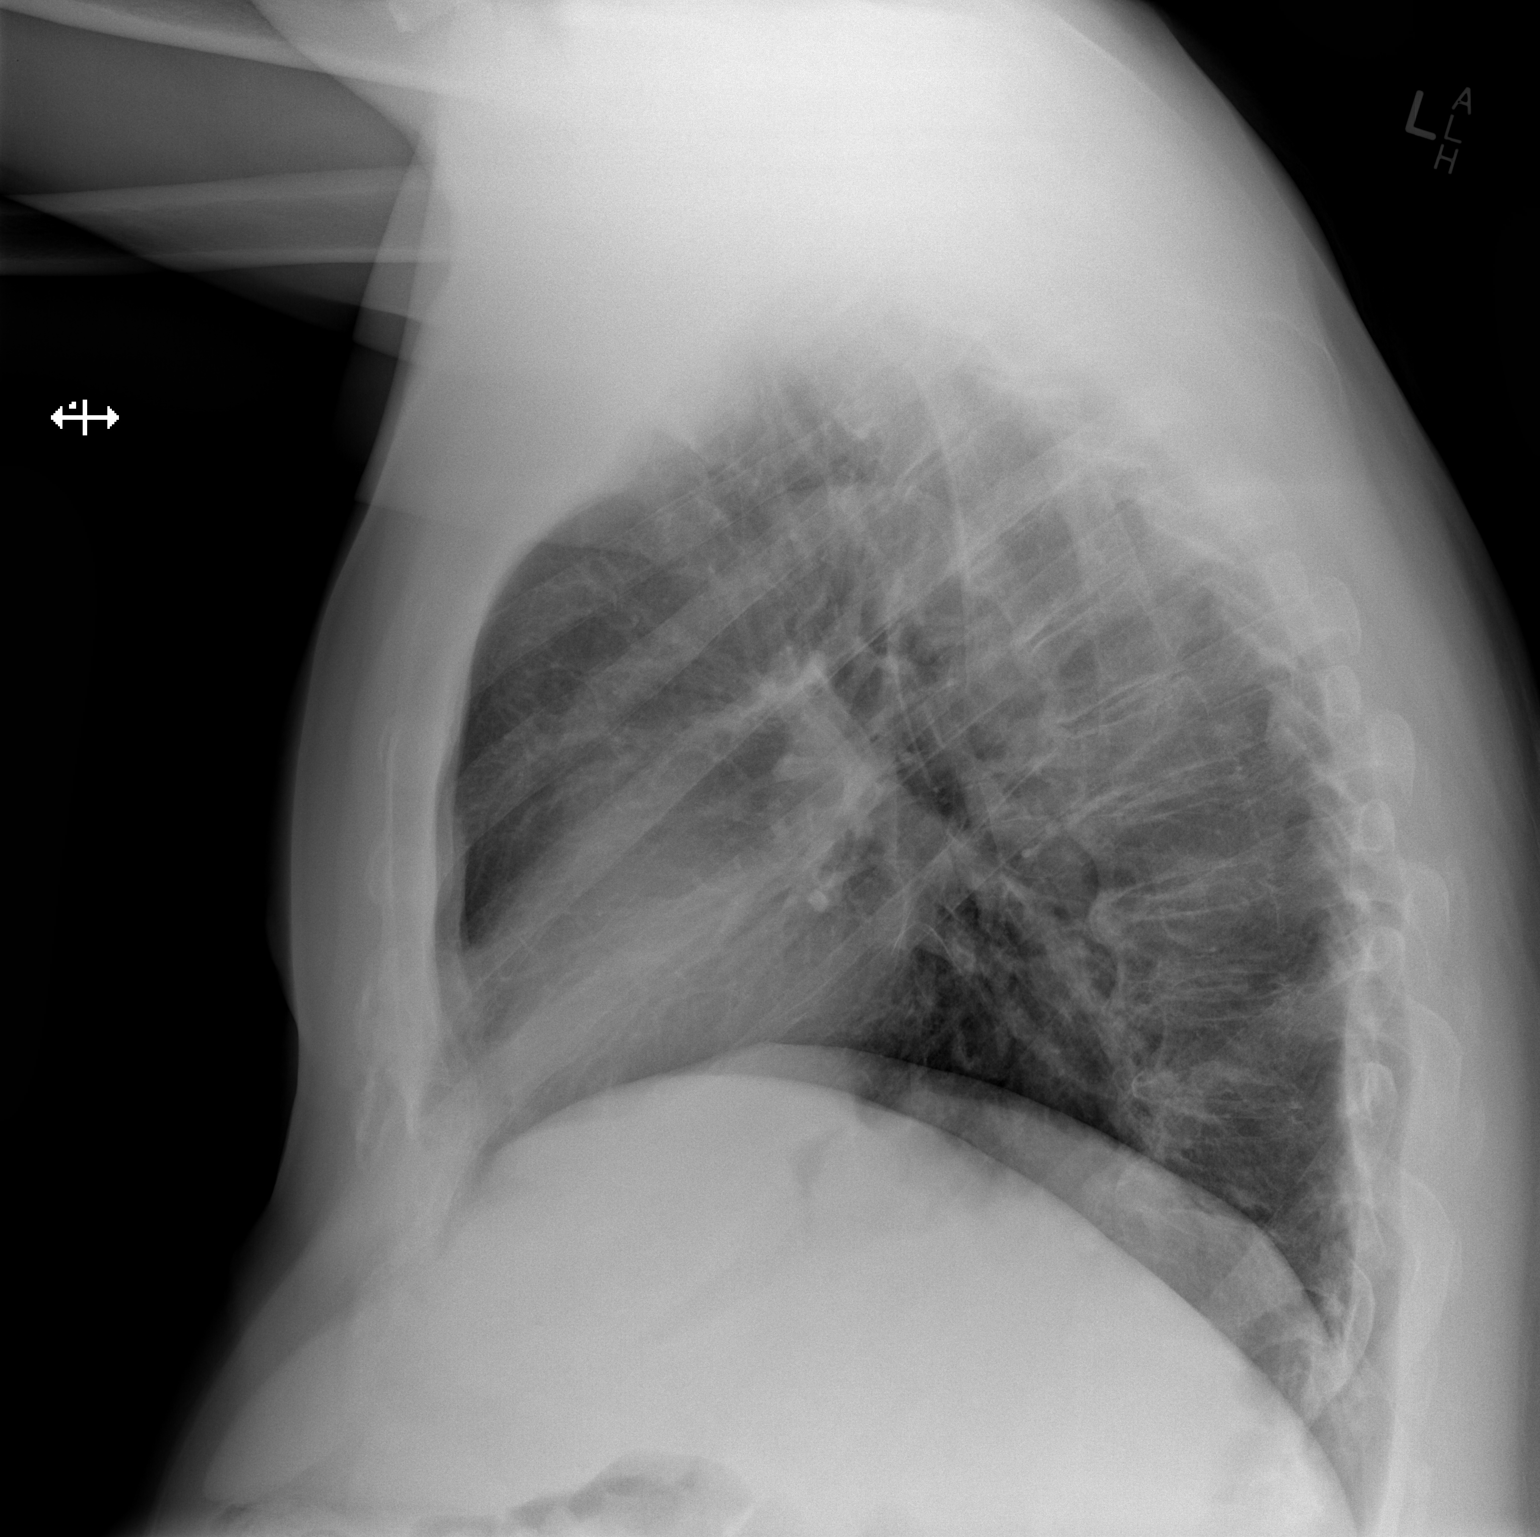

[2 of 2 positions shown; findings below may reference images not displayed]

FINDINGS: The heart size and mediastinal contours are within normal limits.
Both lungs are clear. The visualized skeletal structures are
unremarkable except for degenerative changes of the thoracic spine
with a mild scoliosis.
IMPRESSION: No active cardiopulmonary disease.

## 2017-05-06 ENCOUNTER — Encounter (INDEPENDENT_AMBULATORY_CARE_PROVIDER_SITE_OTHER): Payer: Self-pay | Admitting: Family

## 2017-05-06 ENCOUNTER — Ambulatory Visit (INDEPENDENT_AMBULATORY_CARE_PROVIDER_SITE_OTHER): Payer: BC Managed Care – PPO | Admitting: Family

## 2017-05-06 VITALS — Ht 75.0 in | Wt 238.0 lb

## 2017-05-06 DIAGNOSIS — S92425A Nondisplaced fracture of distal phalanx of left great toe, initial encounter for closed fracture: Secondary | ICD-10-CM

## 2017-05-06 NOTE — Progress Notes (Signed)
Office Visit Note   Patient: Douglas Price           Date of Birth: 15-Apr-1951           MRN: [id number] Visit Date: 05/06/2017              Requested by: Douglas Solian, MD [address] [place], [place] [address] PCP: Douglas Solian, MD  Chief Complaint  Patient presents with  . Left Foot - Pain    Xray with PCP [facility] fx 2 weeks out      HPI: The patient is a 66 year old gentleman who presents today for evaluation of his left great toe. He got up in the middle of the night and stubbed his toe on his Night stand 2 days ago the left great toe is swollen pain tender red the great toenail is loose. He was seen by his primary care doctor, Dr. Dagmar Price. Radiographs were performed and show the distal phalanx fracture. he is in regular shoewear today, states is painfree.  Assessment & Plan: Visit Diagnoses:  1. Closed nondisplaced fracture of distal phalanx of left great toe, initial encounter     Plan:  continue with stiff soled walking shoes. Daily wound checks great toe keep and Neosporin Band-Aid over the base of the nail bed. Discussed that he may lose the toenail will follow-up in 2 weeks. Completely his course of doxycycline as prescribed.  Follow-Up Instructions: Return in about 2 weeks (around 05/20/2017).   Ortho Exam  Patient is alert, oriented, no adenopathy, well-dressed, normal affect, normal respiratory effort. Him on examination of the left great toe. 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 There is some dried blood medially. There is no subungual hematoma. The nail is loose. There is some blistering and ulceration the base of his nail there is no drainage today no erythema no cellulitis. Stiffness with range of motion of the IP joint. The great toe is nontender.  Imaging: No results found. No images are attached to the encounter.  Labs: Lab Results  Component Value Date   HGBA1C 6.4 (H) 03/07/2016   HGBA1C 6.6 (H) 09/08/2015   ESRSEDRATE 4  05/24/2010   REPTSTATUS 08/10/2010 FINAL 08/09/2010   CULT NO GROWTH 08/09/2010    Orders:  No orders of the defined types were placed in this encounter.  No orders of the defined types were placed in this encounter.    Procedures: No procedures performed  Clinical Data: No additional findings.  ROS:  All other systems negative, except as noted in the HPI. Review of Systems  Constitutional: Negative for chills and fever.  Musculoskeletal: Positive for arthralgias.  Skin: Positive for wound. Negative for color change.    Objective: Vital Signs: Ht 6\' 3"  (1.905 m)   Wt 238 lb (108 kg)   BMI 29.75 kg/m   Specialty Comments:  No specialty comments available.  PMFS History: Patient Active Problem List   Diagnosis Date Noted  . S/P lumbar spinal fusion 03/14/2016  . S/P lumbar laminectomy 09/08/2015  . Sleep disturbance 03/24/2014   Past Medical History:  Diagnosis Date  . Abnormal involuntary movements(781.0)   . Acute bronchitis   . Allergic rhinitis due to pollen   . Benign neoplasm of colon   . Blisters with epidermal loss due to burn (second degree) of foot   . Complication of anesthesia    pt woke up during saphenous vein surgery-had epidural  . Constipation due to pain medication   . Cough   .  Degeneration of lumbar or lumbosacral intervertebral disc   . Diverticulosis of colon (without mention of hemorrhage)   . DM (diabetes mellitus) ([place])   . Edema   . Esophageal reflux   . Hypertrophy of prostate with urinary obstruction and other lower urinary tract symptoms (LUTS)   . Loss of weight   . Neuromuscular disorder ([place])    peripheral neuropathy  . Obesity, unspecified   . Pain in limb   . Restless legs syndrome (RLS)   . Sleep disturbance 03/24/2014  . Spinal stenosis, unspecified region other than cervical   . Tobacco use disorder   . Unspecified disease of pericardium   . Unspecified essential hypertension   . Unspecified local infection of skin  and subcutaneous tissue   . Varices of other sites     Family History  Problem Relation Age of Onset  . Cancer Mother   . Heart failure Father   . Tuberculosis Maternal Uncle   . Cancer - Colon Maternal Uncle   . Hypertension Paternal Grandfather     Past Surgical History:  Procedure Laterality Date  . BACK SURGERY    . COLONOSCOPY W/ POLYPECTOMY    . LUMBAR LAMINECTOMY/DECOMPRESSION MICRODISCECTOMY Bilateral 09/08/2015   Procedure: Laminectomy and Foraminotomy - Lumbar two-lumbar three bilateral;  Surgeon: Eustace Moore, MD;  Location: [facility];  Service: Neurosurgery;  Laterality: Bilateral;  . SAPHENOUS VEIN GRAFT RESECTION Right 1991  . [place] SURGERY  11/2010 & 12/2010  . TONSILLECTOMY     Social History   Occupational History  .  [place] History Main Topics  . Smoking status: Current Every Day Smoker    Years: 40.00    Types: Pipe  . Smokeless tobacco: Never Used     Comment: moderate  . Alcohol use No  . Drug use: No  . Sexual activity: Not on file

## 2017-11-20 ENCOUNTER — Encounter: Payer: Self-pay | Admitting: Diagnostic Neuroimaging

## 2017-11-20 ENCOUNTER — Encounter (INDEPENDENT_AMBULATORY_CARE_PROVIDER_SITE_OTHER): Payer: Self-pay

## 2017-11-20 ENCOUNTER — Ambulatory Visit: Payer: BC Managed Care – PPO | Admitting: Diagnostic Neuroimaging

## 2017-11-20 VITALS — BP 110/63 | HR 78 | Wt 241.0 lb

## 2017-11-20 DIAGNOSIS — G20 Parkinson's disease: Secondary | ICD-10-CM | POA: Diagnosis not present

## 2017-11-20 DIAGNOSIS — M48062 Spinal stenosis, lumbar region with neurogenic claudication: Secondary | ICD-10-CM

## 2017-11-20 DIAGNOSIS — E1142 Type 2 diabetes mellitus with diabetic polyneuropathy: Secondary | ICD-10-CM | POA: Diagnosis not present

## 2017-11-20 MED ORDER — CARBIDOPA-LEVODOPA 25-100 MG PO TABS: 1.0000 | ORAL_TABLET | Freq: Three times a day (TID) | ORAL | 6 refills | Status: DC

## 2017-11-20 NOTE — Patient Instructions (Signed)
  PARKINSON'S DISEASE - start carbidopa/levodopa (25/100) --> half tab twice a day or three times a day x 1-2 weeks; then increase to 1 tab three times a day (may take 30 minutes before meals; or may take with a small snack)  - encouraged physical activity  - optimize nutrition

## 2017-11-20 NOTE — Progress Notes (Signed)
[facility]  PATIENT: Douglas Price DOB: 04/18/1951  REFERRING CLINICIAN: R [name]  HISTORY FROM: patient  REASON FOR VISIT: new consult / existing patient     HISTORICAL  CHIEF COMPLAINT:  Chief Complaint  Patient presents with  . Parkinson's disease    rm 6, New Patient, "was being followed by Dr Gustavus Bryant, [facility] neurology"    HISTORY OF PRESENT ILLNESS:   UPDATE (11/20/17, VRP): 67 year old male with parkinson's disease. Since last visit, sxs progressed. Tried carb/levo x 1 week in 2015, then stopped due to jittery side effect. Tried amantadine, without benefit. Has been follwing up at [facility]. Now wants to follow up here in [place].  Neuropathy symptoms are stable.  He is having more balance issues.  PRIOR HPI (09/21/14): 68 year old right handed male with diabetic neuropathy, lumbar spinal stenosis (s/p surgery x 2), hypertension, diabetes and depression, here for evaluation of right hand resting tremor.  Patient reports onset of tremor in right hand around 2006. It has gradually progressed. Initially tremor was postural, but over the last 1-2 years he is noticing resting tremor. He has also noticed some change in his handwriting, which is small at times. Patient also has had progressive gait and balance difficulty, which has previously been attributed to combination of lumbar spinal stenosis as well as peripheral neuropathy.  Interestingly his peripheral neuropathy symptoms started also in 2006 with numbness and tingling in his toes, regressing to feet, ankles, calves and knees. The progression developed over one year. 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 Patient was treated with IVIG 2 cycles, but could not continue due to significant side effects. Patient was initially evaluated by Dr. Jacolyn Reedy, then EMG by Dr. Mendel Corning, then transitioned to Dr.  Tillman [facility]), who followed patient for several years and later reversed diagnosis of CIDP to diabetic neuropathy alone. Around 2010-12, patient was diagnosed with lumbar spinal stenosis, and treated surgically.  Other symptoms include constipation, loss of sense of smell and taste for past for 5 years, insomnia, depression, anxiety, vivid dreams, acting out dreams.    Patient was also evaluated in our sleep clinic by Dr. Rexene Alberts in July 2015 for  Insomnia and consideration of possible sleep apnea diagnosis. He was recommended to have sleep study but this has not been done yet.   REVIEW OF SYSTEMS: Full 14 system review of systems performed and negative except: Restless legs tremor cramps allergies cough weight loss.   ALLERGIES: Allergies  Allergen Reactions  . Avelox [Moxifloxacin] Other (See Comments)    Angioedema Fatigue  . Quinolones Other (See Comments)    Angioedema  . Lexapro [Escitalopram] Other (See Comments)    unknown    HOME MEDICATIONS: Outpatient Medications Prior to Visit  Medication Sig Dispense Refill  . BYSTOLIC 5 MG tablet TK 1 T PO D  3  . Cholecalciferol (VITAMIN D3) 2000 UNITS TABS Take 1 tablet by mouth 2 (two) times daily.    . DULoxetine (CYMBALTA) 60 MG capsule Take 120 mg by mouth daily.     Marland Kitchen dutasteride (AVODART) 0.5 MG capsule Take 0.5 mg by mouth daily.    . fluticasone (FLONASE) 50 MCG/ACT nasal spray PLACE 2 SPRAYS INTO EACH NOSTRIL ONCE DAILY  0  . HYDROcodone-acetaminophen (NORCO/VICODIN) 5-325 MG tablet Twice daily.    Marland Kitchen HYSINGLA ER 20 MG T24A TK 1 T PO D FOR PAIN  0  . loratadine (  CLARITIN) 10 MG tablet Take 10 mg by mouth daily.    . metFORMIN (GLUCOPHAGE) 500 MG tablet Take 500 mg by mouth daily with breakfast.    . metoprolol succinate (TOPROL-XL) 50 MG 24 hr tablet Take 50 mg by mouth daily.     . Multiple Vitamin (MULTIVITAMIN WITH MINERALS) TABS tablet Take 1 tablet by mouth daily.    . naloxegol oxalate (MOVANTIK) 25 MG TABS tablet  Take 25 mg by mouth daily.    Marland Kitchen omeprazole (PRILOSEC) 20 MG capsule Take 20 mg by mouth 2 (two) times daily before a meal.     . pregabalin (LYRICA) 75 MG capsule Take 75 mg by mouth 2 (two) times daily.    Marland Kitchen rOPINIRole (REQUIP) 1 MG tablet Take 1 mg by mouth at bedtime.    . tamsulosin (FLOMAX) 0.4 MG CAPS capsule Take 0.4 mg by mouth daily.    Marland Kitchen telmisartan-hydrochlorothiazide (MICARDIS HCT) 80-12.5 MG per tablet Take 1 tablet by mouth daily.    . cyclobenzaprine (FLEXERIL) 10 MG tablet Take 1 tablet (10 mg total) by mouth 3 (three) times daily as needed for muscle spasms. 30 tablet 0  . oxymorphone (OPANA) 10 MG tablet Take 1 tablet (10 mg total) by mouth 2 (two) times daily. 30 tablet 0   No facility-administered medications prior to visit.     PAST MEDICAL HISTORY: Past Medical History:  Diagnosis Date  . Abnormal involuntary movements(781.0)   . Acute bronchitis   . Allergic rhinitis due to pollen   . Benign neoplasm of colon   . Blisters with epidermal loss due to burn (second degree) of foot   . Complication of anesthesia    pt woke up during saphenous vein surgery-had epidural  . Constipation due to pain medication   . Cough   . Degeneration of lumbar or lumbosacral intervertebral disc   . Diverticulosis of colon (without mention of hemorrhage)   . DM (diabetes mellitus) ([place])   . Edema   . Esophageal reflux   . Hypertrophy of prostate with urinary obstruction and other lower urinary tract symptoms (LUTS)   . Loss of weight   . Neuromuscular disorder ([place])    peripheral neuropathy  . Obesity, unspecified   . Pain in limb   . Restless legs syndrome (RLS)   . Sleep disturbance 03/24/2014  . Spinal stenosis, unspecified region other than cervical   . Tobacco use disorder   . Unspecified disease of pericardium   . Unspecified essential hypertension   . Unspecified local infection of skin and subcutaneous tissue   . Varices of other sites     PAST SURGICAL  HISTORY: Past Surgical History:  Procedure Laterality Date  . BACK SURGERY    . COLONOSCOPY W/ POLYPECTOMY    . LUMBAR LAMINECTOMY/DECOMPRESSION MICRODISCECTOMY Bilateral 09/08/2015   Procedure: Laminectomy and Foraminotomy - Lumbar two-lumbar three bilateral;  Surgeon: Eustace Moore, MD;  Location: [facility];  Service: Neurosurgery;  Laterality: Bilateral;  . SAPHENOUS VEIN GRAFT RESECTION Right 1991  . [place] SURGERY  11/2010 & 12/2010  . TONSILLECTOMY      FAMILY HISTORY: Family History  Problem Relation Age of Onset  . Cancer Mother   . Heart failure Father   . Tuberculosis Maternal Uncle   . Cancer - Colon Maternal Uncle   . Hypertension Paternal Grandfather   . Neuropathy Brother     SOCIAL HISTORY:  Social History   Socioeconomic History  . Marital status: Single  Spouse name: Not on file  . Number of children: 0  . Years of education: BA  . Highest education level: Not on file  Social Needs  . Financial resource strain: Not on file  . Food insecurity - worry: Not on file  . Food insecurity - inability: Not on file  . Transportation needs - medical: Not on file  . Transportation needs - non-medical: Not on file  Occupational History    Employer: [employer]  Tobacco Use  . Smoking status: Current Every Day Smoker    Years: 40.00    Types: Pipe  . Smokeless tobacco: Never Used  . Tobacco comment: moderate, 7 bowls a day or more  Substance and Sexual Activity  . Alcohol use: No    Alcohol/week: 0.0 oz  . Drug use: No  . Sexual activity: Not on file  Other Topics Concern  . Not on file  Social History Narrative   Patient lives at home alone.   Caffeine Use: 2 cups of coffee/tea daily     PHYSICAL EXAM  Vitals:   11/20/17 1419  BP: 110/63  Pulse: 78  Weight: 241 lb (109.3 kg)    Body mass index is 30.12 kg/m.  Vision Screening Comments: Gets regular eye exam, wears glasses  No flowsheet data found.  GENERAL  EXAM: Patient is in no distress; well developed, nourished and groomed; neck is supple  CARDIOVASCULAR: Regular rate and rhythm, no murmurs, no carotid bruits  NEUROLOGIC: MENTAL STATUS: awake, alert, oriented to person, place and time, recent and remote memory intact, normal attention and concentration, language fluent, comprehension intact, naming intact, fund of knowledge appropriate CRANIAL NERVE: no papilledema on fundoscopic exam, pupils equal and reactive to light, visual fields full to confrontation, extraocular muscles intact, no nystagmus, facial sensation and strength symmetric, hearing intact, palate elevates symmetrically, uvula midline, shoulder shrug symmetric, tongue midline. MOTOR: CONSTANT RESTING TREMOR IN RUE; POSTURAL > ACTION TREMOR IN RUE. MODERATE COGWHEELING IN RUE >> LUE; MOD BRADYKINESIA IN BUE AND BLE; WORSE ON LEFT SIDE. Normal bulk and tone, full strength in the BUE, BLE; EXCEPT CANNOT STAND ON TOES (PLANTAR FLEXION WEAKNESS) SENSORY: ABSENT VIBRATION AT TOES AND ANKLES; HIGH ARCHES AND HAMMER TOES COORDINATION: finger-nose-finger, fine finger movements normal REFLEXES: BUE TRACE; KNEES 0, ANKLES 0 GAIT/STATION: SLOW, SHORT STEPS; LIMPS; STIFF GAIT; CANNOT STAND ON TOES; DIFF WITH HEEL STANDING; CANNOT TANDEM; USING SINGLE POINT CANE    DIAGNOSTIC DATA (LABS, IMAGING, TESTING) - I reviewed patient records, labs, notes, testing and imaging myself where available.  Lab Results  Component Value Date   WBC 8.6 03/07/2016   HGB 13.3 03/07/2016   HCT 40.5 03/07/2016   MCV 89.8 03/07/2016   PLT 244 03/07/2016      Component Value Date/Time   NA 137 03/07/2016 1545   K 4.0 03/07/2016 1545   CL 102 03/07/2016 1545   CO2 28 03/07/2016 1545   GLUCOSE 103 (H) 03/07/2016 1545   BUN 17 03/07/2016 1545   CREATININE 0.81 03/07/2016 1545   CALCIUM 9.1 03/07/2016 1545   PROT 6.7 08/09/2010 0220   ALBUMIN 3.7 08/09/2010 0220   AST 45 (H) 08/09/2010 0220   ALT 54  (H) 08/09/2010 0220   ALKPHOS 44 08/09/2010 0220   BILITOT 0.9 08/09/2010 0220   GFRNONAA >60 03/07/2016 1545   GFRAA >60 03/07/2016 1545   No results found for: CHOL, HDL, LDLCALC, LDLDIRECT, TRIG, CHOLHDL Lab Results  Component Value Date  HGBA1C 6.4 (H) 03/07/2016   Lab Results  Component Value Date   [id number] 283 05/24/2010   Lab Results  Component Value Date   TSH 0.712 05/24/2010    04/27/14 MRI lumbar spine (without) 1. Postoperative changes from L3-L5 as above without residual spinal stenosis. 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 Improved right neural foraminal narrowing at L4-5. 2. Progressive adjacent segment disease at L2-3 resulting in severe spinal stenosis. 3. Moderate to severe left neural foraminal stenosis at L5-S1, unchanged.     ASSESSMENT AND PLAN  67 y.o. year old male here with progressive tremor in right upper extremity, with other symptoms of parkinsonism including rigidity, bradykinesia, masked facies, gait difficulty, constipation, anxiety and depression. Consistent with idiopathic Parkinson's disease.   Also over past 9 years, patient has developed evidence of peripheral neuropathy and lumbar radiculopathy/spinal stenosis, in the setting of diabetes. Previously he was diagnosed with CIDP although that apparently that diagnosis was reversed.  Dx: suspected parkinson's disease + neuropathy (diabetic) + lumbar spinal stenosis  1. Parkinson's disease ([place])   2. Diabetic polyneuropathy associated with type 2 diabetes mellitus ([place])   3. Spinal stenosis of lumbar region with neurogenic claudication       PLAN:  PARKINSON'S DISEASE - re-trial of carbidopa/levodopa (25/100) --> half tab twice a day or three times a day x 1-2 weeks; then 1 tab three times a day  - encouraged physical activity  - optimize nutrition  Meds ordered this encounter  Medications  . carbidopa-levodopa (SINEMET IR) 25-100 MG tablet    Sig:  Take 1 tablet by mouth 3 (three) times daily before meals.    Dispense:  90 tablet    Refill:  6   Return in about 4 months (around 03/20/2018).    Penni Bombard, MD 1/51/7616, 0:73 PM Certified in Neurology, Neurophysiology and Neuroimaging  [facility] [address] [place], [place] [address] [phone]

## 2017-11-25 DIAGNOSIS — E1142 Type 2 diabetes mellitus with diabetic polyneuropathy: Secondary | ICD-10-CM | POA: Insufficient documentation

## 2017-11-25 DIAGNOSIS — G20 Parkinson's disease: Secondary | ICD-10-CM | POA: Insufficient documentation

## 2017-11-25 DIAGNOSIS — G20A1 Parkinson's disease without dyskinesia, without mention of fluctuations: Secondary | ICD-10-CM | POA: Insufficient documentation

## 2017-11-25 DIAGNOSIS — M48062 Spinal stenosis, lumbar region with neurogenic claudication: Secondary | ICD-10-CM | POA: Insufficient documentation

## 2018-01-01 ENCOUNTER — Telehealth: Payer: Self-pay | Admitting: Diagnostic Neuroimaging

## 2018-01-01 NOTE — Telephone Encounter (Addendum)
Spoke with patient who stated he took Carbidopa-levodopa for one week and stopped due to "jittery feelings". He stated he will not try it again and would like to try another medication. This RN advised will route his request to Dr Leta Baptist and call him back tomorrow afternoon. He requested to be called after 4 pm. He verbalized understanding, appreciation.

## 2018-01-01 NOTE — Telephone Encounter (Signed)
Patient calling stating carbidopa-levodopa (SINEMET IR) 25-100 MG tablet makes him jittery and anxious. Is there another medication he can try? He uses [facility] on [address] [place]

## 2018-01-03 NOTE — Telephone Encounter (Signed)
LVM informing patient of Dr Gladstone Lighter two options. Advised him the office is now closed, opens Mon at 8 am. Advised if he wants to increase ropinirole fist to try that, he probably has enough to begin doing that. Left number for call back next week with his decision.

## 2018-01-03 NOTE — Telephone Encounter (Signed)
May increase ropinirole to 1mg  twice a day; or may try rytary (extended release carb/levo). -VRP

## 2018-01-28 ENCOUNTER — Other Ambulatory Visit: Payer: Self-pay | Admitting: Diagnostic Neuroimaging

## 2018-01-28 MED ORDER — ROPINIROLE HCL 1 MG PO TABS: 1.0000 mg | ORAL_TABLET | Freq: Two times a day (BID) | ORAL | 12 refills | Status: DC

## 2018-01-28 NOTE — Telephone Encounter (Signed)
Pt states that since Dr Leta Baptist wants to double dose of rOPINIRole (REQUIP) 1 MG tablet he will need a revised prescription called into  [facility] [place], [place] AT [address] [phone] (Phone) [phone] (Fax)

## 2018-01-28 NOTE — Telephone Encounter (Signed)
Updated rx sent in. -VRP

## 2018-01-28 NOTE — Addendum Note (Signed)
Addended by: Andrey Spearman R on: 01/28/2018 11:11 AM   Modules accepted: Orders

## 2018-06-02 ENCOUNTER — Encounter

## 2018-06-02 ENCOUNTER — Ambulatory Visit: Payer: BC Managed Care – PPO | Admitting: Diagnostic Neuroimaging

## 2018-06-02 ENCOUNTER — Encounter: Payer: Self-pay | Admitting: Diagnostic Neuroimaging

## 2018-06-02 VITALS — BP 132/71 | HR 74 | Ht 75.0 in | Wt 234.6 lb

## 2018-06-02 DIAGNOSIS — M48062 Spinal stenosis, lumbar region with neurogenic claudication: Secondary | ICD-10-CM

## 2018-06-02 DIAGNOSIS — E1142 Type 2 diabetes mellitus with diabetic polyneuropathy: Secondary | ICD-10-CM | POA: Diagnosis not present

## 2018-06-02 DIAGNOSIS — G20 Parkinson's disease: Secondary | ICD-10-CM

## 2018-06-02 MED ORDER — ROPINIROLE HCL 1 MG PO TABS: 1.0000 mg | ORAL_TABLET | Freq: Two times a day (BID) | ORAL | 12 refills | Status: DC

## 2018-06-02 MED ORDER — CARBIDOPA-LEVODOPA ER 23.75-95 MG PO CPCR: 1.0000 | ORAL_CAPSULE | Freq: Three times a day (TID) | ORAL | 6 refills | Status: DC

## 2018-06-02 NOTE — Progress Notes (Signed)
[facility]  PATIENT: Douglas Price DOB: 30-Sep-1950  REFERRING CLINICIAN: R [name]  HISTORY FROM: patient  REASON FOR VISIT: existing patient     HISTORICAL  CHIEF COMPLAINT:  Chief Complaint  Patient presents with  . Follow-up  . Parkinson's Disease    Has not noticed any improvement with taking sinemet, so stopped after about 3-4 days.  CBD oil noted some improvement.      HISTORY OF PRESENT ILLNESS:   UPDATE (06/02/18, VRP): Since last visit, doing about the same. Symptoms are stable. Severity is moderate. No alleviating or aggravating factors. Tried carb/levo x 3 days, but felt jittery and anxious, and then stopped.    UPDATE (11/20/17, VRP): 67 year old male with parkinson's disease. Since last visit, sxs progressed. Tried carb/levo x 1 week in 2015, then stopped due to jittery side effect. Tried amantadine, without benefit. Has been following up at [facility]. Now wants to follow up here in [place].  Neuropathy symptoms are stable.  He is having more balance issues.  PRIOR HPI (09/21/14): 67 year old right handed male with diabetic neuropathy, lumbar spinal stenosis (s/p surgery x 2), hypertension, diabetes and depression, here for evaluation of right hand resting tremor.  Patient reports onset of tremor in right hand around 2006. It has gradually progressed. Initially tremor was postural, but over the last 1-2 years he is noticing resting tremor. He has also noticed some change in his handwriting, which is small at times. Patient also has had progressive gait and balance difficulty, which has previously been attributed to combination of lumbar spinal stenosis as well as peripheral neuropathy.  Interestingly his peripheral neuropathy symptoms started also in 2006 with numbness and tingling in his toes, regressing to feet, ankles, calves and knees. The progression developed over one year. 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 Patient was treated with IVIG 2 cycles, but could not continue due to significant side effects. Patient was initially evaluated by Dr. Jacolyn Reedy, then EMG by Dr. Mendel Corning, then transitioned to Dr. Tillman Abide [facility]), who followed patient for several years and later reversed diagnosis of CIDP to diabetic neuropathy alone. Around 2010-12, patient was diagnosed with lumbar spinal stenosis, and treated surgically.  Other symptoms include constipation, loss of sense of smell and taste for past for 5 years, insomnia, depression, anxiety, vivid dreams, acting out dreams.    Patient was also evaluated in our sleep clinic by Dr. Rexene Alberts in July 2015 for  Insomnia and consideration of possible sleep apnea diagnosis. He was recommended to have sleep study but this has not been done yet.   REVIEW OF SYSTEMS: Full 14 system review of systems performed and negative except: Restless legs tremor cramps allergies cough weight loss.   ALLERGIES: Allergies  Allergen Reactions  . Avelox [Moxifloxacin] Other (See Comments)    Angioedema Fatigue  . Quinolones Other (See Comments)    Angioedema  . Lexapro [Escitalopram] Other (See Comments)    unknown    HOME MEDICATIONS: Outpatient Medications Prior to Visit  Medication Sig Dispense Refill  . BYSTOLIC 5 MG tablet TK 1 T PO D  3  . Cholecalciferol (VITAMIN D3) 2000 UNITS TABS Take 1 tablet by mouth 2 (two) times daily.    . DULoxetine (CYMBALTA) 60 MG capsule Take 120 mg by mouth daily.     Marland Kitchen dutasteride (AVODART) 0.5 MG capsule Take 0.5 mg by mouth daily.    Marland Kitchen  fluticasone (FLONASE) 50 MCG/ACT nasal spray PLACE 2 SPRAYS INTO EACH NOSTRIL ONCE DAILY  0  . HYDROcodone-acetaminophen (NORCO/VICODIN) 5-325 MG tablet Twice daily.    Marland Kitchen HYSINGLA ER 20 MG T24A TK 1 T PO D FOR PAIN  0  . loratadine (CLARITIN) 10 MG tablet Take 10 mg by mouth daily.    . metFORMIN (GLUCOPHAGE) 500 MG tablet Take 500 mg by mouth  daily with breakfast.    . Multiple Vitamin (MULTIVITAMIN WITH MINERALS) TABS tablet Take 1 tablet by mouth daily.    . naloxegol oxalate (MOVANTIK) 25 MG TABS tablet Take 25 mg by mouth daily.    Marland Kitchen omeprazole (PRILOSEC) 20 MG capsule Take 20 mg by mouth 2 (two) times daily before a meal.     . pregabalin (LYRICA) 75 MG capsule Take 75 mg by mouth 2 (two) times daily.    Marland Kitchen rOPINIRole (REQUIP) 1 MG tablet Take 1 tablet (1 mg total) by mouth 2 (two) times daily. 60 tablet 12  . tamsulosin (FLOMAX) 0.4 MG CAPS capsule Take 0.4 mg by mouth daily.    Marland Kitchen telmisartan-hydrochlorothiazide (MICARDIS HCT) 80-12.5 MG per tablet Take 1 tablet by mouth daily.    . carbidopa-levodopa (SINEMET IR) 25-100 MG tablet Take 1 tablet by mouth 3 (three) times daily before meals. (Patient not taking: Reported on 01/01/2018) 90 tablet 6  . metoprolol succinate (TOPROL-XL) 50 MG 24 hr tablet Take 50 mg by mouth daily.      No facility-administered medications prior to visit.     PAST MEDICAL HISTORY: Past Medical History:  Diagnosis Date  . Abnormal involuntary movements(781.0)   . Acute bronchitis   . Allergic rhinitis due to pollen   . Benign neoplasm of colon   . Blisters with epidermal loss due to burn (second degree) of foot   . Complication of anesthesia    pt woke up during saphenous vein surgery-had epidural  . Constipation due to pain medication   . Cough   . Degeneration of lumbar or lumbosacral intervertebral disc   . Diverticulosis of colon (without mention of hemorrhage)   . DM (diabetes mellitus) ([place])   . Edema   . Esophageal reflux   . Hypertrophy of prostate with urinary obstruction and other lower urinary tract symptoms (LUTS)   . Loss of weight   . Neuromuscular disorder ([place])    peripheral neuropathy  . Obesity, unspecified   . Pain in limb   . Restless legs syndrome (RLS)   . Sleep disturbance 03/24/2014  . Spinal stenosis, unspecified region other than cervical   . Tobacco use  disorder   . Unspecified disease of pericardium   . Unspecified essential hypertension   . Unspecified local infection of skin and subcutaneous tissue   . Varices of other sites     PAST SURGICAL HISTORY: Past Surgical History:  Procedure Laterality Date  . BACK SURGERY    . COLONOSCOPY W/ POLYPECTOMY    . LUMBAR LAMINECTOMY/DECOMPRESSION MICRODISCECTOMY Bilateral 09/08/2015   Procedure: Laminectomy and Foraminotomy - Lumbar two-lumbar three bilateral;  Surgeon: Eustace Moore, MD;  Location: [facility];  Service: Neurosurgery;  Laterality: Bilateral;  . SAPHENOUS VEIN GRAFT RESECTION Right 1991  . [place] SURGERY  11/2010 & 12/2010  . TONSILLECTOMY      FAMILY HISTORY: Family History  Problem Relation Age of Onset  . Cancer Mother   . Heart failure Father   . Tuberculosis Maternal Uncle   . Cancer - Colon Maternal Uncle   .  Hypertension Paternal Grandfather   . Neuropathy Brother     SOCIAL HISTORY:  Social History   Socioeconomic History  . Marital status: Single    Spouse name: Not on file  . Number of children: 0  . Years of education: BA  . Highest education level: Not on file  Occupational History    Employer: [employer]  Social Needs  . Financial resource strain: Not on file  . Food insecurity:    Worry: Not on file    Inability: Not on file  . Transportation needs:    Medical: Not on file    Non-medical: Not on file  Tobacco Use  . Smoking status: Current Every Day Smoker    Years: 40.00    Types: Pipe  . Smokeless tobacco: Never Used  . Tobacco comment: moderate, 7 bowls a day or more  Substance and Sexual Activity  . Alcohol use: No    Alcohol/week: 0.0 standard drinks  . Drug use: No  . Sexual activity: Not on file  Lifestyle  . Physical activity:    Days per week: Not on file    Minutes per session: Not on file  . Stress: Not on file  Relationships  . Social connections:    Talks on phone: Not on file    Gets  together: Not on file    Attends religious service: Not on file    Active member of club or organization: Not on file    Attends meetings of clubs or organizations: Not on file    Relationship status: Not on file  . Intimate partner violence:    Fear of current or ex partner: Not on file    Emotionally abused: Not on file    Physically abused: Not on file    Forced sexual activity: Not on file  Other Topics Concern  . Not on file  Social History Narrative   Patient lives at home alone.   Caffeine Use: 2 cups of coffee/tea daily     PHYSICAL EXAM  Vitals:   06/02/18 1621  BP: 132/71  Pulse: 74  Weight: 234 lb 9.6 oz (106.4 kg)  Height: 6\' 3"  (1.905 m)    Body mass index is 29.32 kg/m.  No exam data present  No flowsheet data found.  GENERAL EXAM: Patient is in no distress; well developed, nourished and groomed; neck is supple  CARDIOVASCULAR: Regular rate and rhythm, no murmurs, no carotid bruits  NEUROLOGIC: MENTAL STATUS: awake, alert, oriented to person, place and time, recent and remote memory intact, normal attention and concentration, language fluent, comprehension intact, naming intact, fund of knowledge appropriate CRANIAL NERVE: no papilledema on fundoscopic exam, pupils equal and reactive to light, visual fields full to confrontation, extraocular muscles intact, no nystagmus, facial sensation and strength symmetric, hearing intact, palate elevates symmetrically, uvula midline, shoulder shrug symmetric, tongue midline. MOTOR: INTERMITTENT RESTING TREMOR IN RUE; POSTURAL > ACTION TREMOR IN RUE. MODERATE COGWHEELING IN RUE >> LUE; MOD BRADYKINESIA IN BUE AND BLE; WORSE ON LEFT SIDE. Normal bulk and tone, full strength in the BUE, BLE; EXCEPT CANNOT STAND ON TOES (PLANTAR FLEXION WEAKNESS) SENSORY: ABSENT VIBRATION AT TOES AND ANKLES; HIGH ARCHES AND HAMMER TOES COORDINATION: finger-nose-finger, fine finger movements normal REFLEXES: BUE TRACE; KNEES 0, ANKLES  0 GAIT/STATION: SLOW, SHORT STEPS; LIMPS; STIFF GAIT; CANNOT STAND ON TOES; DIFF WITH HEEL STANDING; CANNOT TANDEM; USING SINGLE POINT CANE    DIAGNOSTIC DATA (LABS, IMAGING, TESTING) - I  reviewed patient records, labs, notes, testing and imaging myself where available.  Lab Results  Component Value Date   WBC 8.6 03/07/2016   HGB 13.3 03/07/2016   HCT 40.5 03/07/2016   MCV 89.8 03/07/2016   PLT 244 03/07/2016      Component Value Date/Time   NA 137 03/07/2016 1545   K 4.0 03/07/2016 1545   CL 102 03/07/2016 1545   CO2 28 03/07/2016 1545   GLUCOSE 103 (H) 03/07/2016 1545   BUN 17 03/07/2016 1545   CREATININE 0.81 03/07/2016 1545   CALCIUM 9.1 03/07/2016 1545   PROT 6.7 08/09/2010 0220   ALBUMIN 3.7 08/09/2010 0220   AST 45 (H) 08/09/2010 0220   ALT 54 (H) 08/09/2010 0220   ALKPHOS 44 08/09/2010 0220   BILITOT 0.9 08/09/2010 0220   GFRNONAA >60 03/07/2016 1545   GFRAA >60 03/07/2016 1545   No results found for: CHOL, HDL, LDLCALC, LDLDIRECT, TRIG, CHOLHDL Lab Results  Component Value Date   HGBA1C 6.4 (H) 03/07/2016   Lab Results  Component Value Date   NVBTYOMA00 459 05/24/2010   Lab Results  Component Value Date   TSH 0.712 05/24/2010    04/27/14 MRI lumbar spine (without) 1. Postoperative changes from L3-L5 as above without residual spinal stenosis. 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 Improved right neural foraminal narrowing at L4-5. 2. Progressive adjacent segment disease at L2-3 resulting in severe spinal stenosis. 3. Moderate to severe left neural foraminal stenosis at L5-S1, unchanged.     ASSESSMENT AND PLAN  67 y.o. year old male here with progressive tremor in right upper extremity, with other symptoms of parkinsonism including rigidity, bradykinesia, masked facies, gait difficulty, constipation, anxiety and depression. Consistent with idiopathic Parkinson's disease.   Also over past 9 years, patient has developed  evidence of peripheral neuropathy and lumbar radiculopathy/spinal stenosis, in the setting of diabetes. Previously he was diagnosed with CIDP although that apparently that diagnosis was reversed.   Meds tried: carb/levo (regular release caused side effects), ropinirole (not effective), amantadine (not effective)  Dx: suspected parkinson's disease + neuropathy (diabetic) + lumbar spinal stenosis  1. Parkinson's disease ([place])   2. Diabetic polyneuropathy associated with type 2 diabetes mellitus ([place])   3. Spinal stenosis of lumbar region with neurogenic claudication      PLAN:  PARKINSON'S DISEASE - trial of rytary 1 tab three times a day  - continue ropinirole 1mg  twice a day - encouraged physical activity - optimize nutrition - may consider second opinion movement d/o clinic  Meds ordered this encounter  Medications  . Carbidopa-Levodopa ER (RYTARY) 23.75-95 MG CPCR    Sig: Take 1 capsule by mouth 3 (three) times daily.    Dispense:  90 capsule    Refill:  6   Return in about 6 months (around 12/01/2018).    Penni Bombard, MD 05/31/7413, 2:39 PM Certified in Neurology, Neurophysiology and Neuroimaging  [facility] [address], [place], [place] [address] [phone]

## 2018-06-02 NOTE — Patient Instructions (Signed)
PARKINSON'S DISEASE - trial of rytary 1 tab three times a day  - continue ropinirole 1mg  twice a day

## 2018-07-09 DIAGNOSIS — R200 Anesthesia of skin: Secondary | ICD-10-CM | POA: Diagnosis not present

## 2018-07-09 DIAGNOSIS — M20031 Swan-neck deformity of right finger(s): Secondary | ICD-10-CM | POA: Diagnosis not present

## 2018-07-09 DIAGNOSIS — M20011 Mallet finger of right finger(s): Secondary | ICD-10-CM | POA: Diagnosis not present

## 2018-09-03 DIAGNOSIS — G5601 Carpal tunnel syndrome, right upper limb: Secondary | ICD-10-CM | POA: Diagnosis not present

## 2018-10-06 ENCOUNTER — Telehealth: Payer: Self-pay | Admitting: Diagnostic Neuroimaging

## 2018-10-06 NOTE — Telephone Encounter (Signed)
Pt states he is having problems refilling his Carbidopa-Levodopa ER (RYTARY) 23.75-95 MG CPCR he found a coupon online from [place] but apparently its not working for what his RX states and pt can not afford the medication price. He is showing improvement on this medication but he would like to know if he can get another medication that is affordable ?

## 2018-10-06 NOTE — Telephone Encounter (Signed)
I spoke to pt, and it will cost him $286 for 30 day supply of rytary.  I relayed that can try to get tier reduction thru insurance and see if this would decrease cost.  He was ok to try this.

## 2018-10-07 NOTE — Telephone Encounter (Signed)
Initiated [place] for [employer].  Came back this am with PA not needed.  I called [facility] and spoke to [place].  No exception for tier.  Will have to appeal.  Ok to do? Or change to other drug option if available.

## 2018-10-08 NOTE — Telephone Encounter (Signed)
Received fax relating to rytary (copay issues,request for lower copay denied.  States he is paying the lowest copay possible for the requested drug according to his prescription benefit plan.  I LMVM for him to return call, asking about rytary copy card.

## 2018-10-09 NOTE — Telephone Encounter (Signed)
Spoke to pt and gave him information and # to call relating to his copay card on the rytary.  He has been out 10 days now.  He will call pharmacy and speak to them about his deductible on medication and then decide where to go from there. He verbalized understanding.

## 2018-10-09 NOTE — Telephone Encounter (Signed)
LMVM again for pt to return call.

## 2018-10-21 ENCOUNTER — Encounter: Payer: Self-pay | Admitting: Diagnostic Neuroimaging

## 2018-10-22 DIAGNOSIS — Z6831 Body mass index (BMI) 31.0-31.9, adult: Secondary | ICD-10-CM | POA: Diagnosis not present

## 2018-10-22 DIAGNOSIS — M5136 Other intervertebral disc degeneration, lumbar region: Secondary | ICD-10-CM | POA: Diagnosis not present

## 2018-10-22 DIAGNOSIS — M4800 Spinal stenosis, site unspecified: Secondary | ICD-10-CM | POA: Diagnosis not present

## 2018-10-22 DIAGNOSIS — G20 Parkinson's disease: Secondary | ICD-10-CM | POA: Diagnosis not present

## 2018-10-22 DIAGNOSIS — G8929 Other chronic pain: Secondary | ICD-10-CM | POA: Diagnosis not present

## 2018-10-23 NOTE — Telephone Encounter (Signed)
I called pt, checking to see if was able to get copay card. LMVM for him to call back.  I called pharmacy and they only have CL25/100 tabs listed.

## 2018-11-19 DIAGNOSIS — F329 Major depressive disorder, single episode, unspecified: Secondary | ICD-10-CM | POA: Diagnosis not present

## 2018-11-19 DIAGNOSIS — Z0000 Encounter for general adult medical examination without abnormal findings: Secondary | ICD-10-CM | POA: Diagnosis not present

## 2018-11-19 DIAGNOSIS — I129 Hypertensive chronic kidney disease with stage 1 through stage 4 chronic kidney disease, or unspecified chronic kidney disease: Secondary | ICD-10-CM | POA: Diagnosis not present

## 2018-11-19 DIAGNOSIS — G20 Parkinson's disease: Secondary | ICD-10-CM | POA: Diagnosis not present

## 2018-11-19 DIAGNOSIS — I519 Heart disease, unspecified: Secondary | ICD-10-CM | POA: Diagnosis not present

## 2018-11-19 DIAGNOSIS — E1169 Type 2 diabetes mellitus with other specified complication: Secondary | ICD-10-CM | POA: Diagnosis not present

## 2018-11-19 DIAGNOSIS — M4800 Spinal stenosis, site unspecified: Secondary | ICD-10-CM | POA: Diagnosis not present

## 2018-11-19 DIAGNOSIS — G8929 Other chronic pain: Secondary | ICD-10-CM | POA: Diagnosis not present

## 2018-11-19 DIAGNOSIS — N183 Chronic kidney disease, stage 3 (moderate): Secondary | ICD-10-CM | POA: Diagnosis not present

## 2018-11-19 DIAGNOSIS — E669 Obesity, unspecified: Secondary | ICD-10-CM | POA: Diagnosis not present

## 2018-11-19 DIAGNOSIS — G4700 Insomnia, unspecified: Secondary | ICD-10-CM | POA: Diagnosis not present

## 2018-11-19 DIAGNOSIS — Z1331 Encounter for screening for depression: Secondary | ICD-10-CM | POA: Diagnosis not present

## 2018-12-15 ENCOUNTER — Ambulatory Visit: Payer: BC Managed Care – PPO | Admitting: Diagnostic Neuroimaging

## 2018-12-16 ENCOUNTER — Telehealth: Payer: Self-pay

## 2018-12-16 NOTE — Telephone Encounter (Signed)
Unable to get in contact with the patient due to the patient's line being busy. I will attempt to call back later today.

## 2018-12-17 ENCOUNTER — Ambulatory Visit: Payer: BC Managed Care – PPO | Admitting: Family Medicine

## 2018-12-17 NOTE — Telephone Encounter (Signed)
Pt called back, stated he is doing good and that he will call back to r/s if appt needed.

## 2018-12-17 NOTE — Telephone Encounter (Signed)
Noted  

## 2019-01-19 DIAGNOSIS — E1169 Type 2 diabetes mellitus with other specified complication: Secondary | ICD-10-CM | POA: Diagnosis not present

## 2019-01-20 DIAGNOSIS — G20 Parkinson's disease: Secondary | ICD-10-CM | POA: Diagnosis not present

## 2019-01-20 DIAGNOSIS — G4700 Insomnia, unspecified: Secondary | ICD-10-CM | POA: Diagnosis not present

## 2019-01-20 DIAGNOSIS — G8929 Other chronic pain: Secondary | ICD-10-CM | POA: Diagnosis not present

## 2019-01-20 DIAGNOSIS — M4800 Spinal stenosis, site unspecified: Secondary | ICD-10-CM | POA: Diagnosis not present

## 2019-01-20 DIAGNOSIS — I129 Hypertensive chronic kidney disease with stage 1 through stage 4 chronic kidney disease, or unspecified chronic kidney disease: Secondary | ICD-10-CM | POA: Diagnosis not present

## 2019-01-20 DIAGNOSIS — E1169 Type 2 diabetes mellitus with other specified complication: Secondary | ICD-10-CM | POA: Diagnosis not present

## 2019-01-20 DIAGNOSIS — N183 Chronic kidney disease, stage 3 (moderate): Secondary | ICD-10-CM | POA: Diagnosis not present

## 2019-06-16 ENCOUNTER — Other Ambulatory Visit: Payer: Self-pay | Admitting: Diagnostic Neuroimaging

## 2019-06-25 ENCOUNTER — Other Ambulatory Visit: Payer: Self-pay | Admitting: Diagnostic Neuroimaging

## 2019-07-01 NOTE — Progress Notes (Signed)
PATIENT: Douglas Price DOB: 14-Aug-1951  REASON FOR VISIT: follow up HISTORY FROM: patient  Chief Complaint  Patient presents with  . Follow-up    6 mon f/u. Alone. Rm 6. No new concerns at this time.      HISTORY OF PRESENT ILLNESS: Today 07/02/19 Douglas Price is a 68 y.o. male here today for follow up for PD. He was started on Rytary about a year ago. He tolerated medication well but could not afford medication. He was off therapy for several months but restarted medication about 2 months ago. He tolerates Rytary much better than Simemet. He does note some improvement in tremor. He continues ropinirole for RLS. He is taking Lyrica 75mg  daily for neuropathy. Cymbalta helps with depression and neuropathy. Occasional dizziness with standing. No falls. He is driving. Able to perform all ADL's. No memory concerns. He is working full time. He was a history major, professor and reads daily.    HISTORY: (copied from Dr Gladstone Lighter note on 06/02/2018)  UPDATE (06/02/18, VRP): Since last visit, doing about the same. Symptoms are stable. Severity is moderate. No alleviating or aggravating factors. Tried carb/levo x 3 days, but felt jittery and anxious, and then stopped.    UPDATE (11/20/17, VRP): 68 year old male with parkinson's disease. Since last visit, sxs progressed. Tried carb/levo x 1 week in 2015, then stopped due to jittery side effect. Tried amantadine, without benefit. Has been following up at [facility]. Now wants to follow up here in [place].  Neuropathy symptoms are stable.  He is having more balance issues.  PRIOR HPI (09/21/14): 68 year old right handed male with diabetic neuropathy, lumbar spinal stenosis (s/p surgery x 2), hypertension, diabetes and depression, here for evaluation of right hand resting tremor.  Patient reports onset of tremor in right hand around 2006. It has gradually progressed. Initially tremor was postural, but over the last 1-2 years he is noticing resting  tremor. He has also noticed some change in his handwriting, which is small at times. Patient also has had progressive gait and balance difficulty, which has previously been attributed to combination of lumbar spinal stenosis as well as peripheral neuropathy.  Interestingly his peripheral neuropathy symptoms started also in 2006 with numbness and tingling in his toes, regressing to feet, ankles, calves and knees. The progression developed over one year. 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 Patient was treated with IVIG 2 cycles, but could not continue due to significant side effects. Patient was initially evaluated by Dr. Jacolyn Reedy, then EMG by Dr. Mendel Corning, then transitioned to Dr. [facility]), who followed patient for several years and later reversed diagnosis of CIDP to diabetic neuropathy alone. Around 2010-12, patient was diagnosed with lumbar spinal stenosis, and treated surgically.  Other symptoms include constipation, loss of sense of smell and taste for past for 5 years, insomnia, depression, anxiety, vivid dreams, acting out dreams.    Patient was also evaluated in our sleep clinic by Dr. Rexene Alberts in July 2015 for  Insomnia and consideration of possible sleep apnea diagnosis. He was recommended to have sleep study but this has not been done yet.   REVIEW OF SYSTEMS: Out of a complete 14 system review of symptoms, the patient complains only of the following symptoms, occasional dizziness, numbness in feet, tremor of right arm and all other reviewed systems are negative.  ALLERGIES: Allergies  Allergen Reactions  . Avelox [Moxifloxacin] Other (  See Comments)    Angioedema Fatigue  . Quinolones Other (See Comments)    Angioedema  . Lexapro [Escitalopram] Other (See Comments)    unknown    HOME MEDICATIONS: Outpatient Medications Prior to Visit  Medication Sig Dispense Refill  .  BYSTOLIC 5 MG tablet TK 1 T PO D  3  . Cholecalciferol (VITAMIN D3) 2000 UNITS TABS Take 1 tablet by mouth 2 (two) times daily.    . DULoxetine (CYMBALTA) 60 MG capsule Take 120 mg by mouth daily.     Marland Kitchen dutasteride (AVODART) 0.5 MG capsule Take 0.5 mg by mouth daily.    . fluticasone (FLONASE) 50 MCG/ACT nasal spray PLACE 2 SPRAYS INTO EACH NOSTRIL ONCE DAILY  0  . HYDROcodone-acetaminophen (NORCO/VICODIN) 5-325 MG tablet Twice daily.    Marland Kitchen HYSINGLA ER 20 MG T24A TK 1 T PO D FOR PAIN  0  . loratadine (CLARITIN) 10 MG tablet Take 10 mg by mouth daily.    . metFORMIN (GLUCOPHAGE) 500 MG tablet Take 500 mg by mouth daily with breakfast.    . Multiple Vitamin (MULTIVITAMIN WITH MINERALS) TABS tablet Take 1 tablet by mouth daily.    . naloxegol oxalate (MOVANTIK) 25 MG TABS tablet Take 25 mg by mouth daily.    Marland Kitchen omeprazole (PRILOSEC) 20 MG capsule Take 20 mg by mouth 2 (two) times daily before a meal.     . pregabalin (LYRICA) 75 MG capsule Take 75 mg by mouth 2 (two) times daily.    . tamsulosin (FLOMAX) 0.4 MG CAPS capsule Take 0.4 mg by mouth daily.    Marland Kitchen telmisartan-hydrochlorothiazide (MICARDIS HCT) 80-12.5 MG per tablet Take 1 tablet by mouth daily.    . Carbidopa-Levodopa ER (RYTARY) 23.75-95 MG CPCR Take 1 capsule by mouth 3 (three) times daily. 90 capsule 6  . rOPINIRole (REQUIP) 1 MG tablet Take 1 tablet (1 mg total) by mouth 2 (two) times daily. 60 tablet 12   No facility-administered medications prior to visit.     PAST MEDICAL HISTORY: Past Medical History:  Diagnosis Date  . Abnormal involuntary movements(781.0)   . Acute bronchitis   . Allergic rhinitis due to pollen   . Benign neoplasm of colon   . Blisters with epidermal loss due to burn (second degree) of foot   . Complication of anesthesia    pt woke up during saphenous vein surgery-had epidural  . Constipation due to pain medication   . Cough   . Degeneration of lumbar or lumbosacral intervertebral disc   .  Diverticulosis of colon (without mention of hemorrhage)   . DM (diabetes mellitus) ([place])   . Edema   . Esophageal reflux   . Hypertrophy of prostate with urinary obstruction and other lower urinary tract symptoms (LUTS)   . Loss of weight   . Neuromuscular disorder ([place])    peripheral neuropathy  . Obesity, unspecified   . Pain in limb   . Restless legs syndrome (RLS)   . Sleep disturbance 03/24/2014  . Spinal stenosis, unspecified region other than cervical   . Tobacco use disorder   . Unspecified disease of pericardium   . Unspecified essential hypertension   . Unspecified local infection of skin and subcutaneous tissue   . Varices of other sites     PAST SURGICAL HISTORY: Past Surgical History:  Procedure Laterality Date  . BACK SURGERY    . COLONOSCOPY W/ POLYPECTOMY    . LUMBAR LAMINECTOMY/DECOMPRESSION MICRODISCECTOMY Bilateral 09/08/2015   Procedure: Laminectomy and Foraminotomy -  Lumbar two-lumbar three bilateral;  Surgeon: Eustace Moore, MD;  Location: [facility];  Service: Neurosurgery;  Laterality: Bilateral;  . SAPHENOUS VEIN GRAFT RESECTION Right 1991  . [place] SURGERY  11/2010 & 12/2010  . TONSILLECTOMY      FAMILY HISTORY: Family History  Problem Relation Age of Onset  . Cancer Mother   . Heart failure Father   . Tuberculosis Maternal Uncle   . Cancer - Colon Maternal Uncle   . Hypertension Paternal Grandfather   . Neuropathy Brother     SOCIAL HISTORY: Social History   Socioeconomic History  . Marital status: Single    Spouse name: Not on file  . Number of children: 0  . Years of education: BA  . Highest education level: Not on file  Occupational History    Employer: [employer]  Social Needs  . Financial resource strain: Not on file  . Food insecurity    Worry: Not on file    Inability: Not on file  . Transportation needs    Medical: Not on file    Non-medical: Not on file  Tobacco Use  . Smoking status: Current  Every Day Smoker    Years: 40.00    Types: Pipe  . Smokeless tobacco: Never Used  . Tobacco comment: moderate, 7 bowls a day or more  Substance and Sexual Activity  . Alcohol use: No    Alcohol/week: 0.0 standard drinks  . Drug use: No  . Sexual activity: Not on file  Lifestyle  . Physical activity    Days per week: Not on file    Minutes per session: Not on file  . Stress: Not on file  Relationships  . Social Herbalist on phone: Not on file    Gets together: Not on file    Attends religious service: Not on file    Active member of club or organization: Not on file    Attends meetings of clubs or organizations: Not on file    Relationship status: Not on file  . Intimate partner violence    Fear of current or ex partner: Not on file    Emotionally abused: Not on file    Physically abused: Not on file    Forced sexual activity: Not on file  Other Topics Concern  . Not on file  Social History Narrative   Patient lives at home alone.   Caffeine Use: 2 cups of coffee/tea daily      PHYSICAL EXAM  Vitals:   07/02/19 0922  BP: 118/78  Pulse: 72  Temp: 97.8 F (36.6 C)  TempSrc: Oral  Weight: 244 lb 6.4 oz (110.9 kg)  Height: 6\' 3"  (1.905 m)   Body mass index is 30.55 kg/m.  Generalized: Well developed, in no acute distress  Cardiology: normal rate and rhythm, no murmur noted Neurological examination  Mentation: Alert oriented to time, place, history taking. Follows all commands speech and language fluent Cranial nerve II-XII: Pupils were equal round reactive to light. Extraocular movements were full, visual field were full on confrontational test. Facial sensation and strength were normal. Uvula tongue midline. Head turning and shoulder shrug  were normal and symmetric. Motor: The motor testing reveals 5 over 5 strength of all 4 extremities. Good symmetric motor tone is noted throughout. Right hand tremor at rest  Sensory: Sensory testing is intact to  soft touch on all 4 extremities. No evidence of extinction is noted.  Coordination: Cerebellar testing reveals good finger-nose-finger and heel-to-shin bilaterally.  Gait and station: Gait is mildly spastic but stable, limp noted, walks with cane. Tandem gait not attempted. Romberg is negative. No drift is seen.   DIAGNOSTIC DATA (LABS, IMAGING, TESTING) - I reviewed patient records, labs, notes, testing and imaging myself where available.  No flowsheet data found.   Lab Results  Component Value Date   WBC 8.6 03/07/2016   HGB 13.3 03/07/2016   HCT 40.5 03/07/2016   MCV 89.8 03/07/2016   PLT 244 03/07/2016      Component Value Date/Time   NA 137 03/07/2016 1545   K 4.0 03/07/2016 1545   CL 102 03/07/2016 1545   CO2 28 03/07/2016 1545   GLUCOSE 103 (H) 03/07/2016 1545   BUN 17 03/07/2016 1545   CREATININE 0.81 03/07/2016 1545   CALCIUM 9.1 03/07/2016 1545   PROT 6.7 08/09/2010 0220   ALBUMIN 3.7 08/09/2010 0220   AST 45 (H) 08/09/2010 0220   ALT 54 (H) 08/09/2010 0220   ALKPHOS 44 08/09/2010 0220   BILITOT 0.9 08/09/2010 0220   GFRNONAA >60 03/07/2016 1545   GFRAA >60 03/07/2016 1545   No results found for: CHOL, HDL, LDLCALC, LDLDIRECT, TRIG, CHOLHDL Lab Results  Component Value Date   HGBA1C 6.4 (H) 03/07/2016   Lab Results  Component Value Date   VITAMINB12 604 05/24/2010   Lab Results  Component Value Date   TSH 0.712 05/24/2010       ASSESSMENT AND PLAN 68 y.o. year old male  has a past medical history of Abnormal involuntary movements(781.0), Acute bronchitis, Allergic rhinitis due to pollen, Benign neoplasm of colon, Blisters with epidermal loss due to burn (second degree) of foot, Complication of anesthesia, Constipation due to pain medication, Cough, Degeneration of lumbar or lumbosacral intervertebral disc, Diverticulosis of colon (without mention of hemorrhage), DM (diabetes mellitus) ([place]), Edema, Esophageal reflux, Hypertrophy of prostate with urinary  obstruction and other lower urinary tract symptoms (LUTS), Loss of weight, Neuromuscular disorder ([place]), Obesity, unspecified, Pain in limb, Restless legs syndrome (RLS), Sleep disturbance (03/24/2014), Spinal stenosis, unspecified region other than cervical, Tobacco use disorder, Unspecified disease of pericardium, Unspecified essential hypertension, Unspecified local infection of skin and subcutaneous tissue, and Varices of other sites. here with     ICD-10-CM   1. Parkinson's disease ([place])  G20   2. Diabetic polyneuropathy associated with type 2 diabetes mellitus (HCC)  E11.42   3. Restless leg  G25.81     We will continue Rytary 1 tablet three times daily.  I have advised that he consider price matching with local, family pharmacies.  He may also reach out to the pharmaceutical company for any financial assistance that may be available.  We will continue ropinirole as prescribed.  He will continue close follow-up with primary care as directed.  He will follow-up with me in 6 months, sooner if needed.  He verbalizes understanding and agreement with this plan.   No orders of the defined types were placed in this encounter.    Meds ordered this encounter  Medications  . Carbidopa-Levodopa ER (RYTARY) 23.75-95 MG CPCR    Sig: Take 1 capsule by mouth 3 (three) times daily.    Dispense:  90 capsule    Refill:  5    Order Specific Question:   Supervising Provider    Answer:   Melvenia Beam [id number]  . rOPINIRole (REQUIP) 1 MG tablet    Sig: Take 1 tablet (1 mg total)  by mouth 2 (two) times daily.    Dispense:  60 tablet    Refill:  12    Order Specific Question:   Supervising Provider    Answer:   Melvenia Beam [id number]      I spent 15 minutes with the patient. 50% of this time was spent counseling and educating patient on plan of care and medications.    Debbora Presto, FNP-C 07/02/2019, 12:44 PM [facility] [address], [place], [place] [address] [phone]

## 2019-07-02 ENCOUNTER — Encounter: Payer: Self-pay | Admitting: Family Medicine

## 2019-07-02 ENCOUNTER — Ambulatory Visit (INDEPENDENT_AMBULATORY_CARE_PROVIDER_SITE_OTHER): Payer: BC Managed Care – PPO | Admitting: Family Medicine

## 2019-07-02 ENCOUNTER — Other Ambulatory Visit: Payer: Self-pay

## 2019-07-02 VITALS — BP 118/78 | HR 72 | Temp 97.8°F | Ht 75.0 in | Wt 244.4 lb

## 2019-07-02 DIAGNOSIS — E1142 Type 2 diabetes mellitus with diabetic polyneuropathy: Secondary | ICD-10-CM

## 2019-07-02 DIAGNOSIS — G20 Parkinson's disease: Secondary | ICD-10-CM

## 2019-07-02 DIAGNOSIS — G2581 Restless legs syndrome: Secondary | ICD-10-CM | POA: Diagnosis not present

## 2019-07-02 MED ORDER — ROPINIROLE HCL 1 MG PO TABS: 1.0000 mg | ORAL_TABLET | Freq: Two times a day (BID) | ORAL | 12 refills | Status: DC

## 2019-07-02 MED ORDER — RYTARY 23.75-95 MG PO CPCR: 1.0000 | ORAL_CAPSULE | Freq: Three times a day (TID) | ORAL | 5 refills | Status: DC

## 2019-07-02 NOTE — Patient Instructions (Addendum)
Continue Rytary and Requip as prescribed  Continue close follow up with PCP   Follow up with [place] in 6 months, sooner if needed   Parkinson's Disease Parkinson's disease causes problems with movements. It is a long-term condition. It gets worse over time (is progressive). It affects each person in different ways. It makes it harder for you to:  Control how your body moves.  Move your body normally. The condition can range from mild to very bad (advanced). What are the causes? This condition results from a loss of brain cells called neurons. These brain cells make a chemical called dopamine, which is needed to control body movement. As the condition gets worse, the brain cells make less dopamine. This makes it hard to move or control your movements. The exact cause of this condition is not known. What increases the risk?  Being male.  Being age 66 or older.  Having family members who had Parkinson's disease.  Having had an injury to the brain.  Being very sad (depressed).  Being around things that are harmful or poisonous. What are the signs or symptoms? Symptoms of this condition can vary. The main symptoms have to do with movement. These include:  A tremor or shaking while you are resting that you cannot control.  Stiffness in your neck, arms, and legs.  Slowing of movement. This may include: ? Losing expressions of the face. ? Having trouble making small movements that are needed to button your clothing or brush your teeth.  Walking in a way that is not normal. You may walk with short, shuffling steps.  Loss of balance when standing. You may sway, fall backward, or have trouble making turns. Other symptoms include:  Being very sad, worried, or confused.  Seeing or hearing things that are not real.  Losing thinking abilities (dementia).  Trouble speaking or swallowing.  Having a hard time pooping (constipation).  Needing to pee right away, peeing often, or not  being able to control when you pee or poop.  Sleep problems. How is this treated? There is no cure. The goal of treatment is to manage your symptoms. Treatment may include:  Medicines.  Therapy to help with talking or movement.  Surgery to reduce shaking and other movements that you cannot control. Follow these instructions at home: Medicines  Take over-the-counter and prescription medicines only as told by your doctor.  Avoid taking pain or sleeping medicines. Eating and drinking  Follow instructions from your doctor about what you cannot eat or drink.  Do not drink alcohol. Activity  Talk with your doctor about if it is safe for you to drive.  Do exercises as told by your doctor. Lifestyle      Put in grab bars and railings in your home. These help to prevent falls.  Do not use any products that contain nicotine or tobacco, such as cigarettes, e-cigarettes, and chewing tobacco. If you need help quitting, ask your doctor.  Join a support group. General instructions  Talk with your doctor about what you need help with and what your safety needs are.  Keep all follow-up visits as told by your doctor, including any therapy visits to help with talking or moving. This is important. Contact a doctor if:  Medicines do not help your symptoms.  You feel off-balance.  You fall at home.  You need more help at home.  You have trouble swallowing.  You have a very hard time pooping.  You have a lot of side  effects from your medicines.  You feel very sad, worried, or confused. Get help right away if:  You were hurt in a fall.  You see or hear things that are not real.  You cannot swallow without choking.  You have chest pain or trouble breathing.  You do not feel safe at home.  You have thoughts about hurting yourself or others. If you ever feel like you may hurt yourself or others, or have thoughts about taking your own life, get help right away. You can go  to your nearest emergency department or call:  Your local emergency services (911 in the [place]  A suicide crisis helpline, such as the [facility] at 940-450-7637. This is open 24 hours a day. Summary  This condition causes problems with movements.  It is a long-term condition. It gets worse over time.  There is no cure. Treatment focuses on managing your symptoms.  Talk with your doctor about what you need help with and what your safety needs are.  Keep all follow-up visits as told by your doctor. This is important. This information is not intended to replace advice given to you by your health care provider. Make sure you discuss any questions you have with your health care provider. Document Released: 12/03/2011 Document Revised: 11/27/2018 Document Reviewed: 11/27/2018 Elsevier Patient Education  2020 [employer].

## 2019-07-08 DIAGNOSIS — G20 Parkinson's disease: Secondary | ICD-10-CM | POA: Diagnosis not present

## 2019-07-08 DIAGNOSIS — E1169 Type 2 diabetes mellitus with other specified complication: Secondary | ICD-10-CM | POA: Diagnosis not present

## 2019-07-08 DIAGNOSIS — G8929 Other chronic pain: Secondary | ICD-10-CM | POA: Diagnosis not present

## 2019-07-20 NOTE — Progress Notes (Signed)
I reviewed note and agree with plan.   Penni Bombard, MD [id number], [id number] AM Certified in Neurology, Neurophysiology and Neuroimaging  [facility] [address], [place], [place] [address] [phone]

## 2019-10-05 ENCOUNTER — Telehealth: Payer: Self-pay | Admitting: Family Medicine

## 2019-10-05 NOTE — Telephone Encounter (Signed)
Please let him know that I am not opposed to restarting Sinemet. He reported to Dr Leta Baptist and me in our last visit that Sinemet did not work for him. That is my only hesitation.

## 2019-10-05 NOTE — Telephone Encounter (Signed)
Patient called ina nd wanted to know if he can stop the Carbidopa-Levodopa ER (RYTARY) 23.75-95 MG CPCR and start Sinement. Due to financial issues

## 2019-10-06 NOTE — Telephone Encounter (Signed)
Patient states a VM can be left at his home phone#

## 2019-10-06 NOTE — Telephone Encounter (Signed)
Patient called back in regards to missed call. Please follow up.  °

## 2019-10-06 NOTE — Telephone Encounter (Signed)
LMVM for pt to return call.   

## 2019-10-06 NOTE — Telephone Encounter (Signed)
Pt called back needing to speak to RN. Please call back when available.

## 2019-10-07 NOTE — Telephone Encounter (Signed)
I called and LMVM for pt to return call with what he wants to do, after relaying AL/NP VM on his home #.

## 2019-10-07 NOTE — Telephone Encounter (Signed)
Pt has called to inform Lovey Newcomer, RN that he would like to go with Sinemet extended release, please call to discuss.

## 2019-10-08 NOTE — Telephone Encounter (Signed)
I called pt and had to LM.  I called his pharmacy and he has been using the manufacturing card for his rytary.  Speaking with Truman Hayward , pharmacist [place], he states that it is rejecting as too soon, may get on the 10-13-19, (looks like he has to meet his deductible).  Last fill 07-03-19 from them #90 tabs.  I will wait for his call.

## 2019-10-22 NOTE — Telephone Encounter (Signed)
Pt has called back to inform Sandy,RN that the pharmacy has found the discount card and even with all the discounts he would still have to pay $169.00 which is too much considering when he 1st started he only paid $15.00.  Pt is asking for a call

## 2019-10-26 NOTE — Telephone Encounter (Signed)
I called pharmacy [facility]  Last 06-2019 got 30 day supply of rytary for $199.92. No other medication listed.  Please call bak to schedule appt with AL/NP or Dr. Leta Baptist to discuss.  I have not been able to reach you, leaving multiple messages and not able to connect.  LMVM to make appt.

## 2019-10-26 NOTE — Telephone Encounter (Signed)
I am not sure what would be cheaper. Maybe pharmacy can guide him?

## 2019-10-26 NOTE — Telephone Encounter (Signed)
I called pt and LMVM for him that returned call.  I will forward to AL/NP to see if any other medication option. (see previous phone messages).  On carbidopa levodopa ER.  Cost high for him.  Other option?

## 2019-11-21 ENCOUNTER — Ambulatory Visit: Payer: BC Managed Care – PPO | Attending: Internal Medicine

## 2019-11-21 DIAGNOSIS — Z23 Encounter for immunization: Secondary | ICD-10-CM

## 2019-11-21 NOTE — Progress Notes (Signed)
   Covid-19 Vaccination Clinic  Name:  Douglas Price    MRN: [id number] DOB: 07-25-1951  11/21/2019  [name] was observed post Covid-19 immunization for 30 minutes based on pre-vaccination screening without incidence. He was provided with Vaccine Information Sheet and instruction to access the V-Safe system.   [name] was instructed to call 911 with any severe reactions post vaccine: Marland Kitchen Difficulty breathing  . Swelling of your face and throat  . A fast heartbeat  . A bad rash all over your body  . Dizziness and weakness    Immunizations Administered    Name Date Dose VIS Date Route   Pfizer COVID-19 Vaccine 11/21/2019 12:58 PM 0.3 mL 09/04/2019 Intramuscular   Manufacturer: [place]   Lot: [id number]   [place]: [id number]

## 2019-12-09 DIAGNOSIS — G20 Parkinson's disease: Secondary | ICD-10-CM | POA: Diagnosis not present

## 2019-12-09 DIAGNOSIS — I519 Heart disease, unspecified: Secondary | ICD-10-CM | POA: Diagnosis not present

## 2019-12-09 DIAGNOSIS — F329 Major depressive disorder, single episode, unspecified: Secondary | ICD-10-CM | POA: Diagnosis not present

## 2019-12-09 DIAGNOSIS — E669 Obesity, unspecified: Secondary | ICD-10-CM | POA: Diagnosis not present

## 2019-12-09 DIAGNOSIS — M4800 Spinal stenosis, site unspecified: Secondary | ICD-10-CM | POA: Diagnosis not present

## 2019-12-09 DIAGNOSIS — Z0000 Encounter for general adult medical examination without abnormal findings: Secondary | ICD-10-CM | POA: Diagnosis not present

## 2019-12-09 DIAGNOSIS — E1169 Type 2 diabetes mellitus with other specified complication: Secondary | ICD-10-CM | POA: Diagnosis not present

## 2019-12-09 DIAGNOSIS — G4700 Insomnia, unspecified: Secondary | ICD-10-CM | POA: Diagnosis not present

## 2019-12-09 DIAGNOSIS — F17200 Nicotine dependence, unspecified, uncomplicated: Secondary | ICD-10-CM | POA: Diagnosis not present

## 2019-12-09 DIAGNOSIS — N1831 Chronic kidney disease, stage 3a: Secondary | ICD-10-CM | POA: Diagnosis not present

## 2019-12-09 DIAGNOSIS — G8929 Other chronic pain: Secondary | ICD-10-CM | POA: Diagnosis not present

## 2019-12-09 DIAGNOSIS — I129 Hypertensive chronic kidney disease with stage 1 through stage 4 chronic kidney disease, or unspecified chronic kidney disease: Secondary | ICD-10-CM | POA: Diagnosis not present

## 2019-12-12 ENCOUNTER — Ambulatory Visit: Payer: BC Managed Care – PPO | Attending: Internal Medicine

## 2019-12-12 DIAGNOSIS — Z23 Encounter for immunization: Secondary | ICD-10-CM

## 2019-12-12 NOTE — Progress Notes (Signed)
   Covid-19 Vaccination Clinic  Name:  Douglas Price    MRN: [id number] DOB: Dec 30, 1950  12/12/2019  [name] was observed post Covid-19 immunization for 30 minutes based on pre-vaccination screening without incident. He was provided with Vaccine Information Sheet and instruction to access the V-Safe system.   [name] was instructed to call 911 with any severe reactions post vaccine: Marland Kitchen Difficulty breathing  . Swelling of face and throat  . A fast heartbeat  . A bad rash all over body  . Dizziness and weakness   Immunizations Administered    Name Date Dose VIS Date Route   Pfizer COVID-19 Vaccine 12/12/2019 12:20 PM 0.3 mL 09/04/2019 Intramuscular   Manufacturer: [place]   Lot: [id number]   [place]: [id number]

## 2019-12-16 ENCOUNTER — Ambulatory Visit: Payer: Self-pay

## 2020-01-04 ENCOUNTER — Encounter: Payer: BC Managed Care – PPO | Admitting: Family Medicine

## 2020-01-04 ENCOUNTER — Other Ambulatory Visit: Payer: Self-pay

## 2020-01-12 DIAGNOSIS — L723 Sebaceous cyst: Secondary | ICD-10-CM | POA: Diagnosis not present

## 2020-01-12 DIAGNOSIS — L089 Local infection of the skin and subcutaneous tissue, unspecified: Secondary | ICD-10-CM | POA: Diagnosis not present

## 2020-03-11 DIAGNOSIS — G20 Parkinson's disease: Secondary | ICD-10-CM | POA: Diagnosis not present

## 2020-03-11 DIAGNOSIS — E669 Obesity, unspecified: Secondary | ICD-10-CM | POA: Diagnosis not present

## 2020-03-11 DIAGNOSIS — I129 Hypertensive chronic kidney disease with stage 1 through stage 4 chronic kidney disease, or unspecified chronic kidney disease: Secondary | ICD-10-CM | POA: Diagnosis not present

## 2020-03-11 DIAGNOSIS — G8929 Other chronic pain: Secondary | ICD-10-CM | POA: Diagnosis not present

## 2020-03-11 DIAGNOSIS — E786 Lipoprotein deficiency: Secondary | ICD-10-CM | POA: Diagnosis not present

## 2020-03-11 DIAGNOSIS — E1169 Type 2 diabetes mellitus with other specified complication: Secondary | ICD-10-CM | POA: Diagnosis not present

## 2020-03-11 DIAGNOSIS — N1831 Chronic kidney disease, stage 3a: Secondary | ICD-10-CM | POA: Diagnosis not present

## 2020-03-11 DIAGNOSIS — M4800 Spinal stenosis, site unspecified: Secondary | ICD-10-CM | POA: Diagnosis not present

## 2020-04-14 DIAGNOSIS — G20 Parkinson's disease: Secondary | ICD-10-CM | POA: Diagnosis not present

## 2020-04-19 DIAGNOSIS — G20 Parkinson's disease: Secondary | ICD-10-CM | POA: Diagnosis not present

## 2020-04-19 DIAGNOSIS — R531 Weakness: Secondary | ICD-10-CM | POA: Diagnosis not present

## 2020-04-21 DIAGNOSIS — G20 Parkinson's disease: Secondary | ICD-10-CM | POA: Diagnosis not present

## 2020-04-21 DIAGNOSIS — R531 Weakness: Secondary | ICD-10-CM | POA: Diagnosis not present

## 2020-04-27 DIAGNOSIS — G20 Parkinson's disease: Secondary | ICD-10-CM | POA: Diagnosis not present

## 2020-04-27 DIAGNOSIS — R531 Weakness: Secondary | ICD-10-CM | POA: Diagnosis not present

## 2020-04-29 DIAGNOSIS — R531 Weakness: Secondary | ICD-10-CM | POA: Diagnosis not present

## 2020-04-29 DIAGNOSIS — G20 Parkinson's disease: Secondary | ICD-10-CM | POA: Diagnosis not present

## 2020-05-02 DIAGNOSIS — R531 Weakness: Secondary | ICD-10-CM | POA: Diagnosis not present

## 2020-05-02 DIAGNOSIS — G20 Parkinson's disease: Secondary | ICD-10-CM | POA: Diagnosis not present

## 2020-05-31 DIAGNOSIS — G20 Parkinson's disease: Secondary | ICD-10-CM | POA: Diagnosis not present

## 2020-05-31 DIAGNOSIS — R531 Weakness: Secondary | ICD-10-CM | POA: Diagnosis not present

## 2020-06-09 DIAGNOSIS — R531 Weakness: Secondary | ICD-10-CM | POA: Diagnosis not present

## 2020-06-09 DIAGNOSIS — G20 Parkinson's disease: Secondary | ICD-10-CM | POA: Diagnosis not present

## 2020-06-14 DIAGNOSIS — R531 Weakness: Secondary | ICD-10-CM | POA: Diagnosis not present

## 2020-06-14 DIAGNOSIS — G20 Parkinson's disease: Secondary | ICD-10-CM | POA: Diagnosis not present

## 2020-06-21 DIAGNOSIS — R531 Weakness: Secondary | ICD-10-CM | POA: Diagnosis not present

## 2020-06-21 DIAGNOSIS — G20 Parkinson's disease: Secondary | ICD-10-CM | POA: Diagnosis not present

## 2020-07-07 DIAGNOSIS — G20 Parkinson's disease: Secondary | ICD-10-CM | POA: Diagnosis not present

## 2020-07-07 DIAGNOSIS — R531 Weakness: Secondary | ICD-10-CM | POA: Diagnosis not present

## 2020-07-14 DIAGNOSIS — G20 Parkinson's disease: Secondary | ICD-10-CM | POA: Diagnosis not present

## 2020-07-14 DIAGNOSIS — R531 Weakness: Secondary | ICD-10-CM | POA: Diagnosis not present

## 2020-07-28 DIAGNOSIS — R531 Weakness: Secondary | ICD-10-CM | POA: Diagnosis not present

## 2020-07-28 DIAGNOSIS — G20 Parkinson's disease: Secondary | ICD-10-CM | POA: Diagnosis not present

## 2020-08-15 DIAGNOSIS — G20 Parkinson's disease: Secondary | ICD-10-CM | POA: Diagnosis not present

## 2020-12-15 DIAGNOSIS — R82998 Other abnormal findings in urine: Secondary | ICD-10-CM | POA: Diagnosis not present

## 2021-01-20 DIAGNOSIS — G20 Parkinson's disease: Secondary | ICD-10-CM | POA: Diagnosis not present

## 2021-04-10 DIAGNOSIS — G20 Parkinson's disease: Secondary | ICD-10-CM | POA: Diagnosis not present

## 2021-04-10 DIAGNOSIS — E1169 Type 2 diabetes mellitus with other specified complication: Secondary | ICD-10-CM | POA: Diagnosis not present

## 2021-04-10 DIAGNOSIS — I951 Orthostatic hypotension: Secondary | ICD-10-CM | POA: Diagnosis not present

## 2021-04-10 DIAGNOSIS — N1831 Chronic kidney disease, stage 3a: Secondary | ICD-10-CM | POA: Diagnosis not present

## 2021-04-10 DIAGNOSIS — I129 Hypertensive chronic kidney disease with stage 1 through stage 4 chronic kidney disease, or unspecified chronic kidney disease: Secondary | ICD-10-CM | POA: Diagnosis not present

## 2021-04-13 ENCOUNTER — Encounter: Payer: Self-pay | Admitting: Neurology

## 2021-05-01 DIAGNOSIS — N1831 Chronic kidney disease, stage 3a: Secondary | ICD-10-CM | POA: Diagnosis not present

## 2021-05-01 DIAGNOSIS — E1169 Type 2 diabetes mellitus with other specified complication: Secondary | ICD-10-CM | POA: Diagnosis not present

## 2021-05-01 DIAGNOSIS — I951 Orthostatic hypotension: Secondary | ICD-10-CM | POA: Diagnosis not present

## 2021-05-01 DIAGNOSIS — B079 Viral wart, unspecified: Secondary | ICD-10-CM | POA: Diagnosis not present

## 2021-05-01 DIAGNOSIS — I129 Hypertensive chronic kidney disease with stage 1 through stage 4 chronic kidney disease, or unspecified chronic kidney disease: Secondary | ICD-10-CM | POA: Diagnosis not present

## 2021-05-01 DIAGNOSIS — G20 Parkinson's disease: Secondary | ICD-10-CM | POA: Diagnosis not present

## 2021-05-04 NOTE — Progress Notes (Signed)
Assessment/Plan:   1.  Parkinson's disease, diagnosed 73  -I agree with prior neurologist that it is unlikely levodopa itself caused mood changes.  My suspicion this last time was that he was taken off of Abilify (which I agree with due to causing secondary parkinsonism) at the same time that levodopa was restarted.  It was likely the discontinuation of Abilify which caused the depression and not necessarily the starting of levodopa.  Nonetheless, he thinks that the levodopa has caused side effects and feels mood is better off of the medication.  We talked about trying carbidopa/levodopa CR versus retrying Rytary (which he tolerated but was costly).  He really does not wish to restart levodopa right now.  I discussed with the patient that all patients with Parkinson's disease will be on levodopa at some point.  I also discussed with him that if he wishes to consider surgery in the future (he does), then he would need to be levodopa responsive (with the exception of tremor).  He will think about these things.  -Patient was willing to start pramipexole (he has tolerated ropinirole in the past).  We will slowly work up to pramipexole, 0.5 mg 3 times per day.  Discussed r/b/se.  I do not think that this will be enough, but hopefully, he will tolerate this and it will be a starting point and will allow [place] to gain some trust.  In addition, this medication has been shown to be helpful for mood/depression.  There is no doubt in my mind that with his degree of rigidity, we do need levodopa at some point.  -Discussed with the patient that it would be important for his care to have good continuity of care.  He has seen a number of neurologists between [facility] neurology (saw 2 neurologists there) and [facility] (saw 2 neurologist there).  -met with my LCSW today  2.  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  Certainly, this can be made worse by his low-dose Bystolic (and  Flomax)  3.  Depression  -renew wellbutrin XL, 300 mg daily.  4.  Probable diabetic neuropathy  -Likely contributes to gait instability  -Patient just got a walker.  Subjective:   Douglas Price was seen today in the movement disorders clinic for neurologic consultation at the request of [name], [name], MD.  The consultation is for the evaluation of Parkinson's disease.  Medical records made available to me are reviewed.  Pt with long term friend who supplements the history.   Patient previously cared for by Dr. Leta Baptist as well as Dr. Buck Mam recently (has seen dr Rexene Alberts for snoring and Dr. Tillman Abide at [facility] for PN as well).    Records from [facility] neurology indicate that patient perhaps had right hand tremor starting in 2006, but presented in 2015 with those complaints.  Diagnosed with Parkinson's disease in December, 2015.  He was followed by Dr. Tillman Abide at [facility] from 2015-2019, returning to Dr. Leta Baptist in 2019 until October, 2020.  He transferred his care back to [facility], to the movement disorder clinic to Dr. Buck Mam in July, 2021.  At that point in time, he was off of all medication for Parkinson's disease.  Reported that he had taken levodopa for 1 month and felt jittery.  Tried Rytary and found it helpful, but was expensive.  He had been on ropinirole, but that was dosed for restless leg (1 mg twice daily).  When Dr. Buck Mam saw the patient in July, 2021, she recommended  that he restart levodopa.  When he was seen back in November, 2021, it was recommended that he increase his levodopa to 2 tablets 3 times per day since he was tolerating it.  Records indicate that he had become somewhat depressed and thought it was due to the levodopa and then got up stopping it and was placed on Abilify by primary care.  He followed back up to [facility] in April, 2022 and Abilify was discontinued.  It was recommended again that he increase his carbidopa/levodopa 25/100, so that he was  taking 2 tablets 3 times per day.  He called back at the end of May, 2022 and stated that he was having more anxiety and depression and related back to levodopa again.  He had stopped it 2 and half weeks prior.  The physician had increased his Wellbutrin XL to 300 mg daily.   Specific Symptoms:  Tremor: Yes.  , R hand and some in head when lays down Family hx of similar:  No. Voice: ? Not sure (friend thinks no change) Sleep: sleeps well  Vivid Dreams:  Yes.   (But they are pleasant)  Acting out dreams:  No. Wet Pillows: No. Postural symptoms:  Yes.    Falls?  Yes.  , just one fall a week ago and got foot caught in bedspread Bradykinesia symptoms: shuffling gait, slow movements, and difficulty getting out of a chair Loss of smell:  No. Loss of taste:  No. Urinary Incontinence:  No. Difficulty Swallowing:  "a little hesitation" Handwriting, micrographia: Yes.   Trouble with ADL's:  No.  Trouble buttoning clothing: "a little" Depression:  Yes.  , "mildly" - not sure if meds are helping or not Memory changes:  No. Hallucinations:  No., not in years  visual distortions: Yes.   N/V:  No. Lightheaded:  Yes.  , will get lightheaded if stands up too quick.  He calls near syncope "seizure" and the last episode was a week ago  Syncope: Yes.   But not for years Diplopia:  No. Dyskinesia:  No. Prior exposure to reglan/antipsychotics: Yes.  , abilify  Neuroimaging of the brain has not previously been performed.    PREVIOUS MEDICATIONS: amantadine (no help for tremor); ropinirole - 1 mg bid (on for restless leg); rytary -some help but too expensive.; carbidopa/levodopa 25/100; Lexapro (made him jittery and reports as allergy)  ALLERGIES:   Allergies  Allergen Reactions   Avelox [Moxifloxacin] Other (See Comments)    Angioedema Fatigue   Quinolones Other (See Comments)    Angioedema   Lexapro [Escitalopram] Other (See Comments)    unknown    CURRENT MEDICATIONS:  Current Outpatient  Medications  Medication Instructions   buPROPion (WELLBUTRIN XL) 300 mg, Oral, Daily PRN   BYSTOLIC 5 MG tablet TK 1 T PO D   DULoxetine (CYMBALTA) 60 MG capsule Oral   DULoxetine (CYMBALTA) 120 mg, Oral, Daily   dutasteride (AVODART) 0.5 mg, Daily   fluticasone (FLONASE) 50 MCG/ACT nasal spray PLACE 2 SPRAYS INTO EACH NOSTRIL ONCE DAILY   HYDROcodone-acetaminophen (NORCO/VICODIN) 5-325 MG tablet Twice daily.   loratadine (CLARITIN) 10 mg, Oral, Daily   metFORMIN (GLUCOPHAGE) 500 mg, Daily with breakfast   nebivolol (BYSTOLIC) 5 MG tablet TK 1 T PO D   NUCYNTA ER 50 MG 12 hr tablet SMARTSIG:1 Tablet(s) By Mouth Every 12 Hours PRN   omeprazole (PRILOSEC) 20 mg, Oral, 2 times daily before meals   pregabalin (LYRICA) 75 mg, 2 times daily   tamsulosin (FLOMAX)  0.4 mg, Daily    Objective:   VITALS:   Vitals:   05/08/21 0858  BP: (!) 142/79  Pulse: 74  SpO2: 97%  Weight: 214 lb (97.1 kg)  Height: '6\' 3"'  (1.905 m)    GEN:  The patient appears stated age and is in NAD. HEENT:  Normocephalic, atraumatic.  The mucous membranes are moist. The superficial temporal arteries are without ropiness or tenderness. CV:  RRR Lungs:  CTAB Neck/HEME:  There are no carotid bruits bilaterally.  Neurological examination:  Orientation: The patient is alert and oriented x3.  Cranial nerves: There is good facial symmetry. Extraocular muscles are intact. The visual fields are full to confrontational testing. The speech is fluent and clear. Soft palate rises symmetrically and there is no tongue deviation. Hearing is intact to conversational tone. Sensation: Sensation is intact to light throughout (facial, trunk, extremities). Vibration is absent at the bilateral big toe and decreased at the ankle. There is no extinction with double simultaneous stimulation. There is no sensory dermatomal level identified. Motor: Strength is 5/5 in the bilateral upper and lower extremities.   Shoulder shrug is equal and  symmetric.  There is no pronator drift. Deep tendon reflexes: Deep tendon reflexes are 0-1/4 at the bilateral biceps, triceps, brachioradialis, patella and achilles. Plantar responses are downgoing bilaterally.  Movement examination: Tone: There is mod to severe in the RUE/RLE.  There is mild increased tone in the LUE Abnormal movements: there is RUE rest tremor that increases with distraction Coordination:  There is decremation with RAM's, with any form of RAMS, including alternating supination and pronation of the forearm, hand opening and closing, finger taps, heel taps and toe taps on the R and he has marked decreased toe taps on the L Gait and Station: The patient has  difficulty arising out of a deep-seated chair without the use of the hands.  Makes multiple unsuccessful attempts, but is able to get out of the chair by pushing off of the chair.  The patient's stride length is decreased, even with a cane.  He is somewhat unstable. I have reviewed and interpreted the following labs independently   Chemistry      Component Value Date/Time   NA 137 03/07/2016 1545   K 4.0 03/07/2016 1545   CL 102 03/07/2016 1545   CO2 28 03/07/2016 1545   BUN 17 03/07/2016 1545   CREATININE 0.81 03/07/2016 1545      Component Value Date/Time   CALCIUM 9.1 03/07/2016 1545   ALKPHOS 44 08/09/2010 0220   AST 45 (H) 08/09/2010 0220   ALT 54 (H) 08/09/2010 0220   BILITOT 0.9 08/09/2010 0220      Lab Results  Component Value Date   TSH 0.712 05/24/2010   Lab Results  Component Value Date   WBC 8.6 03/07/2016   HGB 13.3 03/07/2016   HCT 40.5 03/07/2016   MCV 89.8 03/07/2016   PLT 244 03/07/2016     Total time spent on today's visit was 85 minutes, including both face-to-face time and nonface-to-face time.  Time included that spent on review of records (prior notes available to me/labs/imaging if pertinent), discussing treatment and goals, answering patient's questions and coordinating  care.  Cc:  Prince Solian, MD

## 2021-05-08 ENCOUNTER — Other Ambulatory Visit: Payer: Self-pay

## 2021-05-08 ENCOUNTER — Encounter: Payer: Self-pay | Admitting: Neurology

## 2021-05-08 ENCOUNTER — Ambulatory Visit (INDEPENDENT_AMBULATORY_CARE_PROVIDER_SITE_OTHER): Payer: BC Managed Care – PPO | Admitting: Neurology

## 2021-05-08 VITALS — BP 142/79 | HR 74 | Ht 75.0 in | Wt 214.0 lb

## 2021-05-08 DIAGNOSIS — G20 Parkinson's disease: Secondary | ICD-10-CM | POA: Diagnosis not present

## 2021-05-08 DIAGNOSIS — E1142 Type 2 diabetes mellitus with diabetic polyneuropathy: Secondary | ICD-10-CM

## 2021-05-08 MED ORDER — PRAMIPEXOLE DIHYDROCHLORIDE 0.125 MG PO TABS: ORAL_TABLET | ORAL | 0 refills | Status: DC

## 2021-05-08 MED ORDER — BUPROPION HCL ER (XL) 300 MG PO TB24: 300.0000 mg | ORAL_TABLET | Freq: Every day | ORAL | 1 refills | Status: DC | PRN

## 2021-05-08 MED ORDER — PRAMIPEXOLE DIHYDROCHLORIDE 0.5 MG PO TABS: 0.5000 mg | ORAL_TABLET | Freq: Three times a day (TID) | ORAL | 1 refills | Status: DC

## 2021-05-08 NOTE — Patient Instructions (Addendum)
Start mirapex (pramipexole) as follows:  0.125 mg - 1 tablet three times per day for a week, then 2 tablets three times per day for a week and then fill the 0.5 mg tablet and take that, 1 pill three times per day   I refilled your wellbutrin (bupoprion)  Online Resources for Power over [place] over [employer] :   Power Over Parkinson's Patient Education Group will be Wednesday, August 10th-*Hybrid meting*- in person at [employer] location and via [place] at 2:30 pm.  [place] over Parkinson's Meetings:  2nd Wednesdays of the month at 2 pm Contact Amy Marriott at [email or web address] if interested in participating in this online group Parkinson's Care Partners Group:    3rd Mondays, Contact Misty Paladino Atypical Parkinsonian Patient Group:   4th Wednesdays, Contact Misty Paladino If you are interested in participating in these online groups with Misty, please contact her directly for how to join those meetings.  Her contact information is [email or web address].   [place]:  www.[email or web address] PD Health at Home continues:  Mindfulness Mondays, Expert Briefing Tuesdays, Wellness Wednesdays, Take Time Thursdays, Fitness Fridays -Listings for June 2022 are on the website Wellness Wednesday:  [place] for Parkinson's Disease through [place].  Wednesday, August 3rd at 1 pm. Upcoming Webinar:  Use it or Lose It-The Impact of Physical Activity in Parkinson's.  Wednesday, September 7th at 1 pm. Register for Armed forces operational officer) at [email or web address]  Please check out their website to sign up for emails and see their full online offerings  [place]:  [email or web address]  Upcoming Webinar[address] and You:  How to become a Barrister's clerk.  Thursday, August 18th at 12 pm  Check out additional  information on their website to see their full online offerings  [place]:  [email or web address] Upcoming Webinar:  Depression, Anxiety, Mood and Parkinson's.   Tuesday, August 9th  at 4 pm. Care Partner Monthly Meetup.  With 08-19-1973 [place].  First Tuesday of each month, 2 pm Joy Breaks:  First Wednesday of each month, 2-3 pm. There will be art, doodling, making, crafting, listening, laughing, stories, and everything in between. No art experience necessary. No supplies required. Just show up for joy!  Register on their website. Check out additional information to Live Well Today on their website  Parkinson and Movement Disorders (PMD) Alliance:  www.pmdalliance.org NeuroLife Online:  Online Education Events Sign up for emails, which are sent weekly to give you updates on programming and online offerings  Parkinson's Association of the [place]:  www.[email or web address] Caring for Parkinson's, Caring for You Symposium, Saturday, September 10th.  [place], [place].  Symposium Registration - Parkinson Association of the [place] Information on online support groups, education events, and online exercises including Yoga, Parkinson's exercises and more-LOTS of information on links to PD resources and online events Virtual Support Group through Parkinson's Association of the [place]; next one is scheduled for Wednesday, August 3rd at 2 pm. (These are typically scheduled for the 1st Wednesday of the month at 2 pm).  Visit website for details.  Additional links for movement activities: PWR! Moves Classes at [place] RESUMED!  Wednesdays 10 and 11 am.  Contact Amy Marriott, PT [email or web address] or [phone] if interested Here is a link to the PWR!Moves classes on Zoom from [phone] - Daily Mon-Sat at 10:00. Via Zoom, FREE and  open to all.  There is also a link below via Facebook if you use that  platform. [email or web address].no Parkinson's Wellness Recovery (PWR! Moves)  www.[email or web address] Info on the PWR! Virtual Experience:  You will have access to our expertise through self-assessment, guided plans that start with the PD-specific fundamentals, educational content, tips, Q&A with an expert, and a growing Art therapist of PD-specific pre-recorded and live exercise classes of varying types and intensity - both physical and cognitive! If that is not enough, we offer 1:1 wellness consultations (in-person or virtual) to personalize your PWR! Research scientist (medical).  [place] Fridays:  As part of the PD Health @ Home program, this free video series focuses each week on one aspect of fitness designed to support people living with Parkinson's.  These weekly videos highlight the [place] recent fitness guidelines for people with Parkinson's disease.  HollywoodSale.dk Dance for PD website is offering free, live-stream classes throughout the week, as well as links to [employer] of classes:  [email or web address] Dance for Parkinson's Class:  [place].  Free offering for people with Parkinson's and care partners; virtual class.  For more information, contact [phone] or email Ruffin Frederick at [email or web address] Virtual dance and Pilates for Parkinson's classes: Click on the Community Tab> Parkinson's Movement Initiative Tab.  To register for classes and for more information, visit [email or web address] and click the "community" tab.    [employer] Parkinson's Cycling Classes  [employer]: 1pm on Fridays-Live classes at [employer] ([employer] at  Olivia Lopez de [email or web address] or [phone]) [employer]: Virtual Classes Mondays and Thursdays Jeanette Caprice classes Tuesday, Wednesday and Thursday (contact [place] at [email or web address]  or [phone])  [facility] Three levels of classes are offered Tuesdays and Thursdays:  10:30 am,  12 noon & 1:45 pm at [facility]  Active Stretching with Paula Compton Class starting in March, on Fridays To observe a class or for  more information, call [phone] or email [email or web address] Well-Spring Solutions: Online Caregiver Education Opportunities:  [email or web address]-[email or web address]/caregiver-education/[email or web address]-support-group.  You may also contact Vickki Muff at [email or web address] or [phone].   Well-Spring Navigator:  [phone] program, a free service to help individuals and families through the journey of determining care for older adults.  The "Navigator" is a [employer], Education officer, museum, who will speak with a prospective client and/or loved ones to provide an assessment of the situation and a set of recommendations for a personalized care plan -- all free of charge, and whether Well-Spring Solutions offers the needed service or not. If the need is not a service we provide, we are well-connected with reputable programs in town that we can refer you to.  www.well-springsolutions.org or to speak with the Navigator, call [phone].

## 2021-05-24 DIAGNOSIS — M7130 Other bursal cyst, unspecified site: Secondary | ICD-10-CM | POA: Diagnosis not present

## 2021-05-24 DIAGNOSIS — L814 Other melanin hyperpigmentation: Secondary | ICD-10-CM | POA: Diagnosis not present

## 2021-05-24 DIAGNOSIS — L304 Erythema intertrigo: Secondary | ICD-10-CM | POA: Diagnosis not present

## 2021-05-24 DIAGNOSIS — D225 Melanocytic nevi of trunk: Secondary | ICD-10-CM | POA: Diagnosis not present

## 2021-05-24 DIAGNOSIS — D2271 Melanocytic nevi of right lower limb, including hip: Secondary | ICD-10-CM | POA: Diagnosis not present

## 2021-05-24 DIAGNOSIS — D2261 Melanocytic nevi of right upper limb, including shoulder: Secondary | ICD-10-CM | POA: Diagnosis not present

## 2021-05-24 DIAGNOSIS — D2262 Melanocytic nevi of left upper limb, including shoulder: Secondary | ICD-10-CM | POA: Diagnosis not present

## 2021-05-24 DIAGNOSIS — L821 Other seborrheic keratosis: Secondary | ICD-10-CM | POA: Diagnosis not present

## 2021-05-24 DIAGNOSIS — D1801 Hemangioma of skin and subcutaneous tissue: Secondary | ICD-10-CM | POA: Diagnosis not present

## 2021-05-24 DIAGNOSIS — D0359 Melanoma in situ of other part of trunk: Secondary | ICD-10-CM | POA: Diagnosis not present

## 2021-05-24 DIAGNOSIS — C44619 Basal cell carcinoma of skin of left upper limb, including shoulder: Secondary | ICD-10-CM | POA: Diagnosis not present

## 2021-05-24 DIAGNOSIS — C44519 Basal cell carcinoma of skin of other part of trunk: Secondary | ICD-10-CM | POA: Diagnosis not present

## 2021-06-20 DIAGNOSIS — Z85828 Personal history of other malignant neoplasm of skin: Secondary | ICD-10-CM | POA: Diagnosis not present

## 2021-06-20 DIAGNOSIS — D0359 Melanoma in situ of other part of trunk: Secondary | ICD-10-CM | POA: Diagnosis not present

## 2021-10-20 DIAGNOSIS — M79672 Pain in left foot: Secondary | ICD-10-CM | POA: Diagnosis not present

## 2021-10-20 DIAGNOSIS — M25572 Pain in left ankle and joints of left foot: Secondary | ICD-10-CM | POA: Diagnosis not present

## 2021-10-20 DIAGNOSIS — M79671 Pain in right foot: Secondary | ICD-10-CM | POA: Diagnosis not present

## 2021-10-20 DIAGNOSIS — M67961 Unspecified disorder of synovium and tendon, right lower leg: Secondary | ICD-10-CM | POA: Diagnosis not present

## 2021-11-09 ENCOUNTER — Ambulatory Visit: Payer: BC Managed Care – PPO | Admitting: Neurology

## 2021-11-13 ENCOUNTER — Ambulatory Visit: Payer: BC Managed Care – PPO | Admitting: Neurology

## 2021-12-20 NOTE — Progress Notes (Signed)
? ? ?Assessment/Plan:  ? ?1.  Parkinsons Disease ? -He is on pramipexole, but currently only taking it bid.  Discussed with him that this isn't really a bid drug.  He was then hoping he could take it tid with the last q hs.  Discussed that this likely won't help.  Discussed that there is a once per day ER version but insurance likely won't pay for that.  After some discussion, he thinks that he can manage taking it 3 times per day and attempting to get in the middle of the day dose while at work.  We are slightly going to increase his pramipexole so that he takes pramipexole 0.5 mg, 2 tablets in the morning, 1 in the afternoon, 1 about 5:00 in the evening.  He may need more than this.  The pramipexole is definitely helping the rigidity, but he still has some. ? -Patient and I have discussed levodopa.  He understands that I believe that levodopa did not cause the depression, but rather stopping the Abilify at the same time that he previously started levodopa was what actually caused his depression symptoms.  He understands that he is going to need levodopa at some point in time.  He did tolerate Rytary, it was just costly. ? ?2.  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            -He describes symptoms today.  Certainly, this can be made worse by his low-dose Bystolic (and Flomax) ?  ?3.  Depression ?            -renew wellbutrin XL, 300 mg daily. ?  ?4.  Probable diabetic neuropathy ?            -Likely contributes to gait instability ?            -Patient just got a walker. ? ? ?Subjective:  ? ?Douglas Price was seen today in follow up for Parkinsons disease.  My previous records were reviewed prior to todays visit as well as outside records available to me.  Patient was started on pramipexole last visit.  He is only taking it bid - states he has trouble getting it in at work.  Forgets it at lunch. Pt denies falls.  Pt denies lightheadedness, near syncope.  No hallucinations.  Mood has been  good. ? ?Current prescribed movement disorder medications: ?Pramipexole 0.5 mg 3 times per day (started last visit) ?Wellbutrin XL, 300 mg daily. ? ?PREVIOUS MEDICATIONS: PREVIOUS MEDICATIONS: amantadine (no help for tremor); ropinirole - 1 mg bid (on for restless leg); rytary -some help but too expensive.; carbidopa/levodopa 25/100; Lexapro (made him jittery and reports as allergy) ? ?ALLERGIES:   ?Allergies  ?Allergen Reactions  ? Avelox [Moxifloxacin] Other (See Comments)  ?  Angioedema ?Fatigue  ? Quinolones Other (See Comments)  ?  Angioedema  ? Lexapro [Escitalopram] Other (See Comments)  ?  unknown  ? ? ?CURRENT MEDICATIONS:  ?Current Meds  ?Medication Sig  ? buPROPion (WELLBUTRIN XL) 300 MG 24 hr tablet Take 1 tablet (300 mg total) by mouth daily as needed.  ? DULoxetine (CYMBALTA) 60 MG capsule Take 120 mg by mouth daily.   ? DULoxetine (CYMBALTA) 60 MG capsule Take by mouth.  ? dutasteride (AVODART) 0.5 MG capsule Take 0.5 mg by mouth daily.  ? fluticasone (FLONASE) 50 MCG/ACT nasal spray PLACE 2 SPRAYS INTO EACH NOSTRIL ONCE DAILY  ? HYDROcodone-acetaminophen (NORCO/VICODIN) 5-325 MG tablet Twice daily.  ? loratadine (CLARITIN) 10 MG tablet Take 10  mg by mouth daily.  ? metFORMIN (GLUCOPHAGE) 500 MG tablet Take 500 mg by mouth daily with breakfast.  ? mirtazapine (REMERON) 15 MG tablet Take 15 mg by mouth at bedtime.  ? nebivolol (BYSTOLIC) 5 MG tablet TK 1 T PO D  ? NUCYNTA ER 50 MG 12 hr tablet SMARTSIG:1 Tablet(s) By Mouth Every 12 Hours PRN  ? omeprazole (PRILOSEC) 20 MG capsule Take 20 mg by mouth 2 (two) times daily before a meal.   ? pramipexole (MIRAPEX) 0.5 MG tablet Take 1 tablet (0.5 mg total) by mouth 3 (three) times daily.  ? pregabalin (LYRICA) 75 MG capsule Take 75 mg by mouth 2 (two) times daily.  ? tamsulosin (FLOMAX) 0.4 MG CAPS capsule Take 0.4 mg by mouth daily.  ? ? ? ?Objective:  ? ?PHYSICAL EXAMINATION:   ? ?VITALS:   ?Vitals:  ? 12/22/21 0826  ?BP: 115/73  ?Pulse: 83  ?SpO2: 97%   ?Weight: 240 lb 9.6 oz (109.1 kg)  ?Height: '6\' 3"'$  (1.905 m)  ? ? ?GEN:  The patient appears stated age and is in NAD. ?HEENT:  Normocephalic, atraumatic.  The mucous membranes are moist. The superficial temporal arteries are without ropiness or tenderness. ?CV:  RRR ?Lungs:  CTAB ?Neck/HEME:  There are no carotid bruits bilaterally. ? ?Neurological examination: ? ?Orientation: The patient is alert and oriented x3. ?Cranial nerves: There is good facial symmetry with mild facial hypomimia. The speech is fluent and clear. Soft palate rises symmetrically and there is no tongue deviation. Hearing is intact to conversational tone. ?Sensation: Sensation is intact to light touch throughout ?Motor: Strength is at least antigravity x4. ? ?Movement examination: ?Tone: There is mod increased tone in the RUE and nl on the L (this is better) ?Abnormal movements: there is RUE rest tremor that increases with distraction ?Coordination:  There is mild decremation with RAM's, with finger taps on the R and  he has marked decreased toe taps on the L.  Other RAMs are good ?Gait and Station: The patient is flexed at waist, short stepped with decreased arm swing on the R ? ?I have reviewed and interpreted the following labs independently ? ?  Chemistry   ?   ?Component Value Date/Time  ? NA 137 03/07/2016 1545  ? K 4.0 03/07/2016 1545  ? CL 102 03/07/2016 1545  ? CO2 28 03/07/2016 1545  ? BUN 17 03/07/2016 1545  ? CREATININE 0.81 03/07/2016 1545  ?    ?Component Value Date/Time  ? CALCIUM 9.1 03/07/2016 1545  ? ALKPHOS 44 08/09/2010 0220  ? AST 45 (H) 08/09/2010 0220  ? ALT 54 (H) 08/09/2010 0220  ? BILITOT 0.9 08/09/2010 0220  ?  ? ? ? ?Lab Results  ?Component Value Date  ? WBC 8.6 03/07/2016  ? HGB 13.3 03/07/2016  ? HCT 40.5 03/07/2016  ? MCV 89.8 03/07/2016  ? PLT 244 03/07/2016  ? ? ?Lab Results  ?Component Value Date  ? TSH 0.712 05/24/2010  ? ? ? ?Total time spent on today's visit was 21 minutes, including both face-to-face time  and nonface-to-face time.  Time included that spent on review of records (prior notes available to me/labs/imaging if pertinent), discussing treatment and goals, answering patient's questions and coordinating care. ? ?Cc:  [name], [name], MD ? ?

## 2021-12-21 DIAGNOSIS — D649 Anemia, unspecified: Secondary | ICD-10-CM | POA: Diagnosis not present

## 2021-12-22 ENCOUNTER — Ambulatory Visit (INDEPENDENT_AMBULATORY_CARE_PROVIDER_SITE_OTHER): Payer: BC Managed Care – PPO | Admitting: Neurology

## 2021-12-22 ENCOUNTER — Encounter: Payer: Self-pay | Admitting: Neurology

## 2021-12-22 VITALS — BP 115/73 | HR 83 | Ht 75.0 in | Wt 240.6 lb

## 2021-12-22 DIAGNOSIS — G20 Parkinson's disease: Secondary | ICD-10-CM

## 2021-12-22 MED ORDER — PRAMIPEXOLE DIHYDROCHLORIDE 0.5 MG PO TABS: ORAL_TABLET | ORAL | 1 refills | Status: DC

## 2021-12-22 NOTE — Patient Instructions (Signed)
Take pramipexole, 0.5 mg , 2 in the AM, 1 in the afternoon, 1 at 5pm ? ?The physicians and staff at [facility] are committed to providing excellent care. You may receive a survey requesting feedback about your experience at our office. We strive to receive "very good" responses to the survey questions. If you feel that your experience would prevent you from giving the office a "very good " response, please contact our office to try to remedy the situation. We may be reached at [phone]. Thank you for taking the time out of your busy day to complete the survey. ? ?

## 2022-03-07 ENCOUNTER — Other Ambulatory Visit: Payer: Self-pay | Admitting: Orthopedic Surgery

## 2022-03-07 DIAGNOSIS — M19042 Primary osteoarthritis, left hand: Secondary | ICD-10-CM | POA: Diagnosis not present

## 2022-03-07 DIAGNOSIS — M19041 Primary osteoarthritis, right hand: Secondary | ICD-10-CM | POA: Diagnosis not present

## 2022-03-07 DIAGNOSIS — M6740 Ganglion, unspecified site: Secondary | ICD-10-CM | POA: Diagnosis not present

## 2022-03-08 DIAGNOSIS — D509 Iron deficiency anemia, unspecified: Secondary | ICD-10-CM | POA: Diagnosis not present

## 2022-03-16 DIAGNOSIS — H353132 Nonexudative age-related macular degeneration, bilateral, intermediate dry stage: Secondary | ICD-10-CM | POA: Diagnosis not present

## 2022-03-16 DIAGNOSIS — H2513 Age-related nuclear cataract, bilateral: Secondary | ICD-10-CM | POA: Diagnosis not present

## 2022-03-16 DIAGNOSIS — H25013 Cortical age-related cataract, bilateral: Secondary | ICD-10-CM | POA: Diagnosis not present

## 2022-03-16 DIAGNOSIS — E119 Type 2 diabetes mellitus without complications: Secondary | ICD-10-CM | POA: Diagnosis not present

## 2022-03-23 DIAGNOSIS — H5712 Ocular pain, left eye: Secondary | ICD-10-CM | POA: Diagnosis not present

## 2022-03-29 DIAGNOSIS — H2512 Age-related nuclear cataract, left eye: Secondary | ICD-10-CM | POA: Diagnosis not present

## 2022-03-29 DIAGNOSIS — H25012 Cortical age-related cataract, left eye: Secondary | ICD-10-CM | POA: Diagnosis not present

## 2022-03-29 DIAGNOSIS — H25812 Combined forms of age-related cataract, left eye: Secondary | ICD-10-CM | POA: Diagnosis not present

## 2022-04-02 ENCOUNTER — Encounter (HOSPITAL_BASED_OUTPATIENT_CLINIC_OR_DEPARTMENT_OTHER): Payer: Self-pay | Admitting: Orthopedic Surgery

## 2022-04-02 ENCOUNTER — Other Ambulatory Visit: Payer: Self-pay

## 2022-04-02 DIAGNOSIS — D2271 Melanocytic nevi of right lower limb, including hip: Secondary | ICD-10-CM | POA: Diagnosis not present

## 2022-04-02 DIAGNOSIS — L814 Other melanin hyperpigmentation: Secondary | ICD-10-CM | POA: Diagnosis not present

## 2022-04-02 DIAGNOSIS — Z85828 Personal history of other malignant neoplasm of skin: Secondary | ICD-10-CM | POA: Diagnosis not present

## 2022-04-02 DIAGNOSIS — L821 Other seborrheic keratosis: Secondary | ICD-10-CM | POA: Diagnosis not present

## 2022-04-02 DIAGNOSIS — D225 Melanocytic nevi of trunk: Secondary | ICD-10-CM | POA: Diagnosis not present

## 2022-04-02 DIAGNOSIS — D2261 Melanocytic nevi of right upper limb, including shoulder: Secondary | ICD-10-CM | POA: Diagnosis not present

## 2022-04-02 DIAGNOSIS — D2272 Melanocytic nevi of left lower limb, including hip: Secondary | ICD-10-CM | POA: Diagnosis not present

## 2022-04-02 DIAGNOSIS — M7130 Other bursal cyst, unspecified site: Secondary | ICD-10-CM | POA: Diagnosis not present

## 2022-04-06 ENCOUNTER — Encounter (HOSPITAL_BASED_OUTPATIENT_CLINIC_OR_DEPARTMENT_OTHER)
Admission: RE | Admit: 2022-04-06 | Discharge: 2022-04-06 | Disposition: A | Discharge status: Home / Self Care | Payer: BC Managed Care – PPO | Source: Ambulatory Visit | Attending: Orthopedic Surgery | Admitting: Orthopedic Surgery

## 2022-04-06 DIAGNOSIS — Z01818 Encounter for other preprocedural examination: Secondary | ICD-10-CM | POA: Insufficient documentation

## 2022-04-06 DIAGNOSIS — K219 Gastro-esophageal reflux disease without esophagitis: Secondary | ICD-10-CM | POA: Diagnosis not present

## 2022-04-06 DIAGNOSIS — E1142 Type 2 diabetes mellitus with diabetic polyneuropathy: Secondary | ICD-10-CM | POA: Insufficient documentation

## 2022-04-06 DIAGNOSIS — M19042 Primary osteoarthritis, left hand: Secondary | ICD-10-CM | POA: Diagnosis not present

## 2022-04-06 DIAGNOSIS — M67442 Ganglion, left hand: Secondary | ICD-10-CM | POA: Diagnosis not present

## 2022-04-06 DIAGNOSIS — I10 Essential (primary) hypertension: Secondary | ICD-10-CM | POA: Diagnosis not present

## 2022-04-06 DIAGNOSIS — Z7984 Long term (current) use of oral hypoglycemic drugs: Secondary | ICD-10-CM | POA: Diagnosis not present

## 2022-04-06 DIAGNOSIS — E119 Type 2 diabetes mellitus without complications: Secondary | ICD-10-CM | POA: Diagnosis not present

## 2022-04-06 DIAGNOSIS — F17290 Nicotine dependence, other tobacco product, uncomplicated: Secondary | ICD-10-CM | POA: Diagnosis not present

## 2022-04-06 LAB — BASIC METABOLIC PANEL
Anion gap: 12 (ref 5–15)
BUN: 19 mg/dL (ref 8–23)
CO2: 25 mmol/L (ref 22–32)
Calcium: 9.7 mg/dL (ref 8.9–10.3)
Chloride: 101 mmol/L (ref 98–111)
Creatinine, Ser: 0.97 mg/dL (ref 0.61–1.24)
GFR, Estimated: >60 mL/min (ref >60)
Glucose, Bld: 171 mg/dL — ABNORMAL HIGH (ref 70–99)
Potassium: 5.2 mmol/L — ABNORMAL HIGH (ref 3.5–5.1)
Sodium: 138 mmol/L (ref 135–145)

## 2022-04-06 NOTE — Progress Notes (Signed)

## 2022-04-09 ENCOUNTER — Encounter (HOSPITAL_BASED_OUTPATIENT_CLINIC_OR_DEPARTMENT_OTHER)
Admission: RE | Disposition: A | Discharge status: Home / Self Care | Payer: Self-pay | Source: Home / Self Care | Attending: Orthopedic Surgery

## 2022-04-09 ENCOUNTER — Other Ambulatory Visit: Payer: Self-pay

## 2022-04-09 ENCOUNTER — Ambulatory Visit (HOSPITAL_BASED_OUTPATIENT_CLINIC_OR_DEPARTMENT_OTHER): Payer: BC Managed Care – PPO | Admitting: Anesthesiology

## 2022-04-09 ENCOUNTER — Encounter (HOSPITAL_BASED_OUTPATIENT_CLINIC_OR_DEPARTMENT_OTHER): Payer: Self-pay | Admitting: Orthopedic Surgery

## 2022-04-09 ENCOUNTER — Ambulatory Visit (HOSPITAL_BASED_OUTPATIENT_CLINIC_OR_DEPARTMENT_OTHER)
Admission: RE | Admit: 2022-04-09 | Discharge: 2022-04-09 | Disposition: A | Discharge status: Home / Self Care | Payer: BC Managed Care – PPO | Attending: Orthopedic Surgery | Admitting: Orthopedic Surgery

## 2022-04-09 DIAGNOSIS — E119 Type 2 diabetes mellitus without complications: Secondary | ICD-10-CM | POA: Diagnosis not present

## 2022-04-09 DIAGNOSIS — K219 Gastro-esophageal reflux disease without esophagitis: Secondary | ICD-10-CM | POA: Insufficient documentation

## 2022-04-09 DIAGNOSIS — Z7984 Long term (current) use of oral hypoglycemic drugs: Secondary | ICD-10-CM | POA: Insufficient documentation

## 2022-04-09 DIAGNOSIS — M67442 Ganglion, left hand: Secondary | ICD-10-CM | POA: Insufficient documentation

## 2022-04-09 DIAGNOSIS — M19042 Primary osteoarthritis, left hand: Secondary | ICD-10-CM | POA: Insufficient documentation

## 2022-04-09 DIAGNOSIS — F17290 Nicotine dependence, other tobacco product, uncomplicated: Secondary | ICD-10-CM | POA: Diagnosis not present

## 2022-04-09 DIAGNOSIS — E1142 Type 2 diabetes mellitus with diabetic polyneuropathy: Secondary | ICD-10-CM

## 2022-04-09 DIAGNOSIS — I10 Essential (primary) hypertension: Secondary | ICD-10-CM | POA: Diagnosis not present

## 2022-04-09 DIAGNOSIS — M71342 Other bursal cyst, left hand: Secondary | ICD-10-CM | POA: Diagnosis not present

## 2022-04-09 DIAGNOSIS — Z01818 Encounter for other preprocedural examination: Secondary | ICD-10-CM

## 2022-04-09 DIAGNOSIS — F17200 Nicotine dependence, unspecified, uncomplicated: Secondary | ICD-10-CM | POA: Diagnosis not present

## 2022-04-09 DIAGNOSIS — M151 Heberden's nodes (with arthropathy): Secondary | ICD-10-CM | POA: Diagnosis not present

## 2022-04-09 DIAGNOSIS — M1990 Unspecified osteoarthritis, unspecified site: Secondary | ICD-10-CM | POA: Diagnosis not present

## 2022-04-09 HISTORY — PX: TENDON EXPLORATION: SHX5112

## 2022-04-09 HISTORY — PX: CYST EXCISION: SHX5701

## 2022-04-09 LAB — GLUCOSE, CAPILLARY
Glucose-Capillary: 149 mg/dL — ABNORMAL HIGH (ref 70–99)
Glucose-Capillary: 137 mg/dL — ABNORMAL HIGH (ref 70–99)

## 2022-04-09 SURGERY — CYST REMOVAL
Anesthesia: Monitor Anesthesia Care | Site: Finger | Laterality: Left

## 2022-04-09 MED ORDER — LIDOCAINE 2% (20 MG/ML) 5 ML SYRINGE
INTRAMUSCULAR | Status: AC
  Filled 2022-04-09: qty 5

## 2022-04-09 MED ORDER — OXYCODONE HCL 5 MG PO TABS: 5.0000 mg | ORAL_TABLET | Freq: Once | ORAL | Status: DC | PRN

## 2022-04-09 MED ORDER — ACETAMINOPHEN 500 MG PO TABS
ORAL_TABLET | ORAL | Status: AC
Start: 2022-04-09 — End: ?
  Filled 2022-04-09: qty 2

## 2022-04-09 MED ORDER — PROPOFOL 500 MG/50ML IV EMUL
INTRAVENOUS | Status: DC | PRN
  Administered 2022-04-09: 75 ug/kg/min via INTRAVENOUS

## 2022-04-09 MED ORDER — 0.9 % SODIUM CHLORIDE (POUR BTL) OPTIME
TOPICAL | Status: DC | PRN
  Administered 2022-04-09: 75 mL

## 2022-04-09 MED ORDER — ONDANSETRON HCL 4 MG/2ML IJ SOLN: 4.0000 mg | Freq: Once | INTRAMUSCULAR | Status: DC | PRN

## 2022-04-09 MED ORDER — FENTANYL CITRATE (PF) 100 MCG/2ML IJ SOLN
INTRAMUSCULAR | Status: DC | PRN
  Administered 2022-04-09: 25 ug via INTRAVENOUS
  Administered 2022-04-09: 50 ug via INTRAVENOUS
  Administered 2022-04-09: 25 ug via INTRAVENOUS

## 2022-04-09 MED ORDER — BUPIVACAINE HCL (PF) 0.25 % IJ SOLN
INTRAMUSCULAR | Status: DC | PRN
  Administered 2022-04-09: 9 mL

## 2022-04-09 MED ORDER — HYDROCODONE-ACETAMINOPHEN 5-325 MG PO TABS: ORAL_TABLET | ORAL | 0 refills | Status: DC

## 2022-04-09 MED ORDER — ACETAMINOPHEN 500 MG PO TABS
1000.0000 mg | ORAL_TABLET | Freq: Once | ORAL | Status: AC
Start: 2022-04-09 — End: 2022-04-09
  Administered 2022-04-09: 1000 mg via ORAL

## 2022-04-09 MED ORDER — PROPOFOL 10 MG/ML IV BOLUS
INTRAVENOUS | Status: AC
  Filled 2022-04-09: qty 20

## 2022-04-09 MED ORDER — ONDANSETRON HCL 4 MG/2ML IJ SOLN
INTRAMUSCULAR | Status: DC | PRN
  Administered 2022-04-09: 4 mg via INTRAVENOUS

## 2022-04-09 MED ORDER — FENTANYL CITRATE (PF) 100 MCG/2ML IJ SOLN: 25.0000 ug | INTRAMUSCULAR | Status: DC | PRN

## 2022-04-09 MED ORDER — FENTANYL CITRATE (PF) 100 MCG/2ML IJ SOLN
INTRAMUSCULAR | Status: AC
  Filled 2022-04-09: qty 2

## 2022-04-09 MED ORDER — OXYCODONE HCL 5 MG/5ML PO SOLN: 5.0000 mg | Freq: Once | ORAL | Status: DC | PRN

## 2022-04-09 MED ORDER — LACTATED RINGERS IV SOLN: INTRAVENOUS | Status: DC

## 2022-04-09 MED ORDER — CEFAZOLIN SODIUM-DEXTROSE 2-4 GM/100ML-% IV SOLN
2.0000 g | INTRAVENOUS | Status: AC
  Administered 2022-04-09: 2 g via INTRAVENOUS

## 2022-04-09 MED ORDER — CEFAZOLIN SODIUM-DEXTROSE 2-4 GM/100ML-% IV SOLN
INTRAVENOUS | Status: AC
  Filled 2022-04-09: qty 100

## 2022-04-09 MED ORDER — AMISULPRIDE (ANTIEMETIC) 5 MG/2ML IV SOLN: 10.0000 mg | Freq: Once | INTRAVENOUS | Status: DC | PRN

## 2022-04-09 SURGICAL SUPPLY — 59 items
APL PRP STRL LF DISP 70% ISPRP (MISCELLANEOUS) ×2
APL SKNCLS STERI-STRIP NONHPOA (GAUZE/BANDAGES/DRESSINGS)
BANDAGE GAUZE 1X75IN STRL (MISCELLANEOUS) IMPLANT
BENZOIN TINCTURE PRP APPL 2/3 (GAUZE/BANDAGES/DRESSINGS) IMPLANT
BLADE MINI RND TIP GREEN BEAV (BLADE) IMPLANT
BLADE SURG 15 STRL LF DISP TIS (BLADE) ×6 IMPLANT
BLADE SURG 15 STRL SS (BLADE) ×6
BNDG CMPR 5X2 CHSV 1 LYR STRL (GAUZE/BANDAGES/DRESSINGS)
BNDG CMPR 75X11 PLY HI ABS (MISCELLANEOUS)
BNDG CMPR 75X21 PLY HI ABS (MISCELLANEOUS)
BNDG CMPR 9X4 STRL LF SNTH (GAUZE/BANDAGES/DRESSINGS) ×2
BNDG COHESIVE 1X5 TAN STRL LF (GAUZE/BANDAGES/DRESSINGS) ×2 IMPLANT
BNDG COHESIVE 2X5 TAN ST LF (GAUZE/BANDAGES/DRESSINGS) IMPLANT
BNDG ELASTIC 2X5.8 VLCR STR LF (GAUZE/BANDAGES/DRESSINGS) IMPLANT
BNDG ELASTIC 3X5.8 VLCR STR LF (GAUZE/BANDAGES/DRESSINGS) IMPLANT
BNDG ESMARK 4X9 LF (GAUZE/BANDAGES/DRESSINGS) ×2 IMPLANT
BNDG GAUZE 1X75IN STRL (MISCELLANEOUS)
BNDG GAUZE DERMACEA FLUFF (GAUZE/BANDAGES/DRESSINGS)
BNDG GAUZE DERMACEA FLUFF 4 (GAUZE/BANDAGES/DRESSINGS) IMPLANT
BNDG GZE DERMACEA 4 6PLY (GAUZE/BANDAGES/DRESSINGS)
BNDG PLASTER X FAST 3X3 WHT LF (CAST SUPPLIES) IMPLANT
BNDG PLSTR 9X3 FST ST WHT (CAST SUPPLIES)
CHLORAPREP W/TINT 26 (MISCELLANEOUS) ×4 IMPLANT
CORD BIPOLAR FORCEPS 12FT (ELECTRODE) ×4 IMPLANT
COVER BACK TABLE 60X90IN (DRAPES) ×4 IMPLANT
COVER MAYO STAND STRL (DRAPES) ×4 IMPLANT
CUFF TOURN SGL QUICK 18X4 (TOURNIQUET CUFF) ×4 IMPLANT
DRAPE EXTREMITY T 121X128X90 (DISPOSABLE) ×4 IMPLANT
DRAPE SURG 17X23 STRL (DRAPES) ×4 IMPLANT
GAUZE SPONGE 4X4 12PLY STRL (GAUZE/BANDAGES/DRESSINGS) ×4 IMPLANT
GAUZE STRETCH 2X75IN STRL (MISCELLANEOUS) IMPLANT
GAUZE XEROFORM 1X8 LF (GAUZE/BANDAGES/DRESSINGS) ×4 IMPLANT
GLOVE BIO SURGEON STRL SZ7.5 (GLOVE) ×4 IMPLANT
GLOVE BIOGEL PI IND STRL 7.0 (GLOVE) ×2 IMPLANT
GLOVE BIOGEL PI IND STRL 8 (GLOVE) ×3 IMPLANT
GLOVE BIOGEL PI INDICATOR 7.0 (GLOVE) ×2
GLOVE BIOGEL PI INDICATOR 8 (GLOVE) ×1
GLOVE ECLIPSE 6.5 STRL STRAW (GLOVE) ×2 IMPLANT
GOWN STRL REUS W/ TWL LRG LVL3 (GOWN DISPOSABLE) ×3 IMPLANT
GOWN STRL REUS W/TWL LRG LVL3 (GOWN DISPOSABLE) ×3
NDL HYPO 25X1 1.5 SAFETY (NEEDLE) ×2 IMPLANT
NEEDLE HYPO 25X1 1.5 SAFETY (NEEDLE) ×3 IMPLANT
NS IRRIG 1000ML POUR BTL (IV SOLUTION) ×4 IMPLANT
PACK BASIN DAY SURGERY FS (CUSTOM PROCEDURE TRAY) ×4 IMPLANT
PAD CAST 3X4 CTTN HI CHSV (CAST SUPPLIES) IMPLANT
PAD CAST 4YDX4 CTTN HI CHSV (CAST SUPPLIES) IMPLANT
PADDING CAST ABS 4INX4YD NS (CAST SUPPLIES)
PADDING CAST ABS COTTON 4X4 ST (CAST SUPPLIES) ×2 IMPLANT
PADDING CAST COTTON 3X4 STRL (CAST SUPPLIES)
PADDING CAST COTTON 4X4 STRL (CAST SUPPLIES)
SPLINT FINGER 3.25 911903 (SOFTGOODS) ×2 IMPLANT
STOCKINETTE 4X48 STRL (DRAPES) ×4 IMPLANT
STRIP CLOSURE SKIN 1/2X4 (GAUZE/BANDAGES/DRESSINGS) IMPLANT
SUT ETHILON 3 0 PS 1 (SUTURE) IMPLANT
SUT ETHILON 4 0 PS 2 18 (SUTURE) ×4 IMPLANT
SYR BULB EAR ULCER 3OZ GRN STR (SYRINGE) ×4 IMPLANT
SYR CONTROL 10ML LL (SYRINGE) ×4 IMPLANT
TOWEL GREEN STERILE FF (TOWEL DISPOSABLE) ×6 IMPLANT
UNDERPAD 30X36 HEAVY ABSORB (UNDERPADS AND DIAPERS) ×4 IMPLANT

## 2022-04-09 NOTE — Discharge Instructions (Addendum)
No Tylenol until 6pm today if needed.   Post Anesthesia Home Care Instructions  Activity: Get plenty of rest for the remainder of the day. A responsible individual must stay with you for 24 hours following the procedure.  For the next 24 hours, DO NOT: -Drive a car -Paediatric nurse -Drink alcoholic beverages -Take any medication unless instructed by your physician -Make any legal decisions or sign important papers.  Meals: Start with liquid foods such as gelatin or soup. Progress to regular foods as tolerated. Avoid greasy, spicy, heavy foods. If nausea and/or vomiting occur, drink only clear liquids until the nausea and/or vomiting subsides. Call your physician if vomiting continues.  Special Instructions/Symptoms: Your throat may feel dry or sore from the anesthesia or the breathing tube placed in your throat during surgery. If this causes discomfort, gargle with warm salt water. The discomfort should disappear within 24 hours.  If you had a scopolamine patch placed behind your ear for the management of post- operative nausea and/or vomiting:  1. The medication in the patch is effective for 72 hours, after which it should be removed.  Wrap patch in a tissue and discard in the trash. Wash hands thoroughly with soap and water. 2. You may remove the patch earlier than 72 hours if you experience unpleasant side effects which may include dry mouth, dizziness or visual disturbances. 3. Avoid touching the patch. Wash your hands with soap and water after contact with the patch.      [facility] Instructions Hand Surgery  Wound Care: Keep your hand elevated above the level of your heart.  Do not allow it to dangle by your side.  Keep the dressing dry and do not remove it unless your doctor advises you to do so.  He will usually change it at the time of your post-op visit.  Moving your fingers is advised to stimulate circulation but will depend on the site of your surgery.  If you have a  splint applied, your doctor will advise you regarding movement.  Activity: Do not drive or operate machinery today.  Rest today and then you may return to your normal activity and work as indicated by your physician.  Diet:  Drink liquids today or eat a light diet.  You may resume a regular diet tomorrow.    General expectations: Pain for two to three days. Fingers may become slightly swollen.  Call your doctor if any of the following occur: Severe pain not relieved by pain medication. Elevated temperature. Dressing soaked with blood. Inability to move fingers. White or bluish color to fingers.

## 2022-04-09 NOTE — Anesthesia Preprocedure Evaluation (Addendum)
Anesthesia Evaluation  Patient identified by MRN, date of birth, ID band Patient awake    Reviewed: Allergy & Precautions, NPO status , Patient's Chart, lab work & pertinent test results  History of Anesthesia Complications Negative for: history of anesthetic complications  Airway Mallampati: II  TM Distance: >3 FB Neck ROM: Full    Dental   Pulmonary Current Smoker and Patient abstained from smoking.,    Pulmonary exam normal        Cardiovascular hypertension, Pt. on medications and Pt. on home beta blockers Normal cardiovascular exam   Echo 2018: Vigorous LV systolic function (EF 80-99%); mild diastolic dysfunction; trace MR and TR.    Neuro/Psych Parkinson's disease    GI/Hepatic Neg liver ROS, GERD  ,  Endo/Other  diabetes, Type 2, Oral Hypoglycemic Agents  Renal/GU negative Renal ROS     Musculoskeletal negative musculoskeletal ROS (+)   Abdominal   Peds  Hematology negative hematology ROS (+)   Anesthesia Other Findings   Reproductive/Obstetrics                            Anesthesia Physical Anesthesia Plan  ASA: 2  Anesthesia Plan: MAC and Bier Block and Bier Block-LIDOCAINE ONLY   Post-op Pain Management: Tylenol PO (pre-op)*   Induction: Intravenous  PONV Risk Score and Plan: 1 and Propofol infusion, TIVA and Treatment may vary due to age or medical condition  Airway Management Planned: Natural Airway, Nasal Cannula and Simple Face Mask  Additional Equipment: None  Intra-op Plan:   Post-operative Plan:   Informed Consent: I have reviewed the patients History and Physical, chart, labs and discussed the procedure including the risks, benefits and alternatives for the proposed anesthesia with the patient or authorized representative who has indicated his/her understanding and acceptance.       Plan Discussed with:   Anesthesia Plan Comments:          Anesthesia Quick Evaluation

## 2022-04-09 NOTE — H&P (Signed)
Douglas Price is an 70 y.o. male.   Chief Complaint: mucoid cyst HPI: 71 yo male with mucoid cyst left index finger and dip joint arthritis.  It is bothersome to him.  He wishes to have the cyst removed and the dip joint debrided to try to prevent recurrence.  Allergies:  Allergies  Allergen Reactions   Avelox [Moxifloxacin] Other (See Comments)    Angioedema Fatigue   Quinolones Other (See Comments)    Angioedema   Lexapro [Escitalopram] Other (See Comments)    unknown    Past Medical History:  Diagnosis Date   Allergic rhinitis due to pollen    Benign neoplasm of colon    Blisters with epidermal loss due to burn (second degree) of foot    Complication of anesthesia    pt woke up during saphenous vein surgery-had epidural   Constipation due to pain medication    Degeneration of lumbar or lumbosacral intervertebral disc    Diverticulosis of colon (without mention of hemorrhage)    DM (diabetes mellitus) (HCC)    Esophageal reflux    Hypertrophy of prostate with urinary obstruction and other lower urinary tract symptoms (LUTS)    Loss of weight    Neuromuscular disorder (HCC)    peripheral neuropathy   Obesity, unspecified    Pain in limb    Restless legs syndrome (RLS)    Sleep disturbance 03/24/2014   Spinal stenosis, unspecified region other than cervical    Tobacco use disorder    Unspecified disease of pericardium    Unspecified essential hypertension    Unspecified local infection of skin and subcutaneous tissue    Varices of other sites     Past Surgical History:  Procedure Laterality Date   COLONOSCOPY W/ POLYPECTOMY     LUMBAR LAMINECTOMY/DECOMPRESSION MICRODISCECTOMY Bilateral 09/08/2015   Procedure: Laminectomy and Foraminotomy - Lumbar two-lumbar three bilateral;  Surgeon: Eustace Moore, MD;  Location: [facility];  Service: Neurosurgery;  Laterality: Bilateral;   SAPHENOUS VEIN GRAFT RESECTION Right 09/24/1989   TONSILLECTOMY      Family  History: Family History  Problem Relation Age of Onset   Heart failure Mother    Lung cancer Father    Neuropathy Brother    Hypertension Paternal Grandfather    Tuberculosis Maternal Uncle    Cancer - Colon Maternal Uncle     Social History:   reports that he has been smoking pipe. He has never used smokeless tobacco. He reports that he does not drink alcohol and does not use drugs.  Medications: Medications Prior to Admission  Medication Sig Dispense Refill   buPROPion (WELLBUTRIN XL) 300 MG 24 hr tablet Take 1 tablet (300 mg total) by mouth daily as needed. 90 tablet 1   DULoxetine (CYMBALTA) 60 MG capsule Take 120 mg by mouth daily.      dutasteride (AVODART) 0.5 MG capsule Take 0.5 mg by mouth daily.     HYDROcodone-acetaminophen (NORCO/VICODIN) 5-325 MG tablet Twice daily.     loratadine (CLARITIN) 10 MG tablet Take 10 mg by mouth daily.     metFORMIN (GLUCOPHAGE) 500 MG tablet Take 500 mg by mouth daily with breakfast.     mirtazapine (REMERON) 15 MG tablet Take 15 mg by mouth at bedtime.     nebivolol (BYSTOLIC) 5 MG tablet TK 1 T PO D     omeprazole (PRILOSEC) 20 MG capsule Take 20 mg by mouth 2 (two) times daily before a meal.  pramipexole (MIRAPEX) 0.5 MG tablet 2 in the AM, 1 at noon, 1 at 5pm 450 tablet 1   pregabalin (LYRICA) 75 MG capsule Take 75 mg by mouth 2 (two) times daily.     tamsulosin (FLOMAX) 0.4 MG CAPS capsule Take 0.4 mg by mouth daily.     fluticasone (FLONASE) 50 MCG/ACT nasal spray PLACE 2 SPRAYS INTO EACH NOSTRIL ONCE DAILY  0    Results for orders placed or performed during the hospital encounter of 04/09/22 (from the past 48 hour(s))  Glucose, capillary     Status: Abnormal   Collection Time: 04/09/22 11:52 AM  Result Value Ref Range   Glucose-Capillary 149 (H) 70 - 99 mg/dL    Comment: Glucose reference range applies only to samples taken after fasting for at least 8 hours.    No results found.    Blood pressure 134/89, pulse 75,  temperature (!) 97.3 F (36.3 C), resp. rate 18, height '6\' 3"'$  (1.905 m), weight 105.5 kg, SpO2 95 %.  General appearance: alert, cooperative, and appears stated age Head: Normocephalic, without obvious abnormality, atraumatic Neck: supple, symmetrical, trachea midline Extremities: Intact sensation and capillary refill all digits.  +epl/fpl/io.  No wounds.  Pulses: 2+ and symmetric Skin: Skin color, texture, turgor normal. No rashes or lesions Neurologic: Grossly normal Incision/Wound: none  Assessment/Plan Left index finger mucoid cyst and dip joint arthritis.  Plan excision cyst and debridement of joint with rotation flap as necessary.  Non operative and operative treatment options have been discussed with the patient and patient wishes to proceed with operative treatment. Risks, benefits, and alternatives of surgery have been discussed and the patient agrees with the plan of care.   Leanora Cover 04/09/2022, 12:55 PM

## 2022-04-09 NOTE — Anesthesia Procedure Notes (Signed)
Anesthesia Regional Block: Bier block (IV Regional)   Pre-Anesthetic Checklist: , timeout performed,  Correct Patient, Correct Site, Correct Laterality,  Correct Procedure, Correct Position, site marked,  Risks and benefits discussed,  Surgical consent,  Pre-op evaluation,  At surgeon's request  Laterality: Left  Prep: alcohol swabs        Procedures:,,,,, intact distal pulses, single tourniquet utilized,  #20gu IV placed    Narrative:  CRNA: Verita Lamb, CRNA

## 2022-04-09 NOTE — Op Note (Signed)
NAME: Douglas Price RECORD NO: [id number] DATE OF BIRTH: 1951-09-14 FACILITY: [facility] LOCATION: [facility] PHYSICIAN: Tennis Must, MD   OPERATIVE REPORT   DATE OF PROCEDURE: 04/09/22    PREOPERATIVE DIAGNOSIS: Left index finger mucoid cyst and DIP joint arthritis   POSTOPERATIVE DIAGNOSIS: Left index finger mucoid cyst and DIP joint arthritis   PROCEDURE: 1.  Left index finger excision of mucoid cyst 2.  Left index finger debridement of DIP joint   SURGEON:  Leanora Cover, M.D.   ASSISTANT: none   ANESTHESIA:  Bier block with sedation   INTRAVENOUS FLUIDS:  Per anesthesia flow sheet.   ESTIMATED BLOOD LOSS:  Minimal.   COMPLICATIONS:  None.   SPECIMENS: Left index finger mucoid cyst to pathology   TOURNIQUET TIME:    Total Tourniquet Time Documented: Forearm (Left) - 25 minutes Total: Forearm (Left) - 25 minutes    DISPOSITION:  Stable to PACU.   INDICATIONS: 71 year old male with cyst on left index finger.  It is bothersome to him.  He wishes to have it removed and the DIP joint debrided to try to prevent recurrence risks, benefits and alternatives of surgery were discussed including the risks of blood loss, infection, damage to nerves, vessels, tendons, ligaments, bone for surgery, need for additional surgery, complications with wound healing, continued pain, stiffness, , recurrence.  He voiced understanding of these risks and elected to proceed.  OPERATIVE COURSE:  After being identified preoperatively by myself,  the patient and I agreed on the procedure and site of the procedure.  The surgical site was marked.  Surgical consent had been signed. Preoperative IV antibiotic prophylaxis was given. He was transferred to the operating room and placed on the operating table in supine position with the left upper extremity on an arm board.  Bier block anesthesia was induced by the anesthesiologist.   upper extremity was prepped and draped in normal  sterile orthopedic fashion.  A surgical pause was performed between the surgeons, anesthesia, and operating room staff and all were in agreement as to the patient, procedure, and site of procedure.  Tourniquet at the proximal aspect of the forearm had been inflated for the Bier block.  A hockey-stick shaped incision was made at the dorsum of the DIP joint of the left index finger.  This was carried into subcutaneous tissues by spreading technique.  Bipolar electrocautery is used to obtain hemostasis.  The cyst was identified.  It was filled with clear gelatinous fluid.  It was freed up some from surrounding tissues.  It was removed with a synovectomy rondure.  It was sent to pathology for examination.  The DIP joint was entered underneath the extensor tendon and the rondure used to debride the joint including performing synovectomy and taken down prominent bone at the dorsum of the middle phalanx on the radial side.  The wound and joint were copiously irrigated with sterile saline.  The wound was closed with 4-0 nylon in a horizontal mattress fashion.  It was dressed with sterile Xeroform 4 x 4 and wrapped with a Coban dressing lightly.  An AlumaFoam splint was placed and wrapped lightly with Coban dressing.  A digital block was performed with quarter percent plain Marcaine to aid in postoperative analgesia.  The tourniquet was deflated at 25 minutes.  Fingertips were pink with brisk capillary refill after deflation of tourniquet.  The operative  drapes were broken down.  The patient was awoken from anesthesia safely.  He was transferred  back to the stretcher and taken to PACU in stable condition.  I will see him back in the office in 1 week for postoperative followup.  I will give him a prescription for Norco 5/325 1-2 tabs PO q6 hours prn pain, dispense # 15.   Leanora Cover, MD Electronically signed, 04/09/22

## 2022-04-09 NOTE — Anesthesia Postprocedure Evaluation (Signed)
Anesthesia Post Note  Patient: Douglas Price  Procedure(s) Performed: LEFT INDEX FINGER MUCOID CYST EXCISION (Left: Finger) DEBRIDEMENT DISTAL INTERPHALANGEAL JOINT LEFT INDEX FINGER (Left: Finger)     Patient location during evaluation: PACU Anesthesia Type: MAC Level of consciousness: awake and alert Pain management: pain level controlled Vital Signs Assessment: post-procedure vital signs reviewed and stable Respiratory status: spontaneous breathing, nonlabored ventilation and respiratory function stable Cardiovascular status: blood pressure returned to baseline and stable Postop Assessment: no apparent nausea or vomiting Anesthetic complications: no   No notable events documented.  Last Vitals:  Vitals:   04/09/22 1410 04/09/22 1432  BP: (!) 154/75 128/76  Pulse: 68 66  Resp: 19 16  Temp:  36.5 C  SpO2: 94% 96%    Last Pain:  Vitals:   04/09/22 1432  PainSc: 0-No pain                 Lidia Collum

## 2022-04-09 NOTE — Transfer of Care (Signed)
Immediate Anesthesia Transfer of Care Note  Patient: Douglas Price  Procedure(s) Performed: LEFT INDEX FINGER MUCOID CYST EXCISION (Left) DEBRIDEMENT DISTAL INTERPHALANGEAL JOINT (Left) POSSIBLE ROTATION FLAP (Left)  Patient Location: PACU  Anesthesia Type:MAC and Bier block  Level of Consciousness: awake, alert  and oriented  Airway & Oxygen Therapy: Patient Spontanous Breathing  Post-op Assessment: Report given to RN and Post -op Vital signs reviewed and stable  Post vital signs: Reviewed and stable  Last Vitals:  Vitals Value Taken Time  BP    Temp    Pulse 70 04/09/22 1346  Resp    SpO2 93 % 04/09/22 1346  Vitals shown include unvalidated device data.  Last Pain:  Vitals:   04/09/22 1150  PainSc: 0-No pain      Patients Stated Pain Goal: 7 (16/10/96 0454)  Complications: No notable events documented.

## 2022-04-10 ENCOUNTER — Encounter (HOSPITAL_BASED_OUTPATIENT_CLINIC_OR_DEPARTMENT_OTHER): Payer: Self-pay | Admitting: Orthopedic Surgery

## 2022-04-10 LAB — SURGICAL PATHOLOGY

## 2022-04-25 DIAGNOSIS — D649 Anemia, unspecified: Secondary | ICD-10-CM | POA: Diagnosis not present

## 2022-04-25 DIAGNOSIS — K5900 Constipation, unspecified: Secondary | ICD-10-CM | POA: Diagnosis not present

## 2022-04-25 DIAGNOSIS — Z86010 Personal history of colonic polyps: Secondary | ICD-10-CM | POA: Diagnosis not present

## 2022-05-10 DIAGNOSIS — H25811 Combined forms of age-related cataract, right eye: Secondary | ICD-10-CM | POA: Diagnosis not present

## 2022-05-15 ENCOUNTER — Telehealth: Payer: Self-pay | Admitting: Neurology

## 2022-05-15 ENCOUNTER — Other Ambulatory Visit: Payer: Self-pay

## 2022-05-15 MED ORDER — BUPROPION HCL ER (XL) 300 MG PO TB24: 300.0000 mg | ORAL_TABLET | Freq: Every day | ORAL | 0 refills | Status: DC | PRN

## 2022-05-15 NOTE — Telephone Encounter (Signed)
1. Which medications need refilled? (List name and dosage, if known) bupropion   2. Which pharmacy/location is medication to be sent to? (include street and city if local pharmacy) [facility] [address]  30 day supply

## 2022-05-15 NOTE — Telephone Encounter (Signed)
Called left message for patient that prescription has been sent

## 2022-05-23 ENCOUNTER — Telehealth: Payer: Self-pay | Admitting: Family Medicine

## 2022-05-23 NOTE — Telephone Encounter (Signed)
Patient is now being followed by Dr [name] for PD.

## 2022-06-05 ENCOUNTER — Other Ambulatory Visit: Payer: Self-pay | Admitting: Orthopedic Surgery

## 2022-06-15 ENCOUNTER — Other Ambulatory Visit: Payer: Self-pay | Admitting: Neurology

## 2022-06-21 NOTE — Progress Notes (Signed)
Assessment/Plan:   1.  Parkinsons Disease  -He is on pramipexole, but currently only taking it bid.  Discussed with him the last several visits that this isn't really a bid drug.   Discussed that there is a once per day ER version but insurance likely won't pay for that.  After some discussion again today, he thinks that he can manage taking it 3 times per day and attempting to get in the middle of the day dose while at work.  He is to take pramipexole 0.5 mg, 2 tablets in the morning, 1 in the afternoon, 1 about 5:00 in the evening.    -Patient and I have discussed levodopa.  He understands that I believe that levodopa did not cause the depression, but rather stopping the Abilify at the same time that he previously started levodopa was what actually caused his depression symptoms.  We discussed this previously as well as today.  He did tolerate Rytary, it was just costly.  Ultimately, we decided to go ahead and restart levodopa given degree of rigidity today.  He will take carbidopa/levodopa 25/100 and slowly work to 1 tablet at 7 AM/11 AM/4 PM.  Titration schedule was given.  2.  Depression             -Continue wellbutrin XL, 300 mg daily.   3.  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   Subjective:   Domenica Fail was seen today in follow up for Parkinsons disease.  My previous records were reviewed prior to todays visit as well as outside records available to me.  Last visit, we discussed timing of pramipexole and discussed importance of trying to take on a 3 times per day basis.  He reports that he is still only getting it bid.  No compulsive behaviors or sleep attacks.  He admits to 2 falls in the last 30 days.  He fell on the L hip both times.    He didn't have the walker.  His foot got stuck to the ground and he fell.  The L foot will drag when he walks.    Current prescribed movement disorder  medications: Pramipexole 0.5 mg, 2/1/1  Wellbutrin XL, 300 mg daily.  PREVIOUS MEDICATIONS: PREVIOUS MEDICATIONS: amantadine (no help for tremor); ropinirole - 1 mg bid (on for restless leg); rytary -some help but too expensive.; carbidopa/levodopa 25/100; Lexapro (made him jittery and reports as allergy)  ALLERGIES:   Allergies  Allergen Reactions   Avelox [Moxifloxacin] Other (See Comments)    Angioedema Fatigue   Quinolones Other (See Comments)    Angioedema   Lexapro [Escitalopram] Other (See Comments)    unknown    CURRENT MEDICATIONS:  Current Meds  Medication Sig   buPROPion (WELLBUTRIN XL) 300 MG 24 hr tablet TAKE 1 TABLET(300 MG) BY MOUTH DAILY AS NEEDED   DULoxetine (CYMBALTA) 60 MG capsule Take 120 mg by mouth daily.    dutasteride (AVODART) 0.5 MG capsule Take 0.5 mg by mouth daily.   HYDROcodone-acetaminophen (NORCO/VICODIN) 5-325 MG tablet 1-2 tabs PO q6 hours prn pain   loratadine (CLARITIN) 10 MG tablet Take 10 mg by mouth daily.   metFORMIN (GLUCOPHAGE) 500 MG tablet Take 500 mg by mouth daily with breakfast.   mirtazapine (REMERON) 15 MG tablet Take 15 mg by mouth at bedtime.  nebivolol (BYSTOLIC) 5 MG tablet TK 1 T PO D   omeprazole (PRILOSEC) 20 MG capsule Take 20 mg by mouth 2 (two) times daily before a meal.    pramipexole (MIRAPEX) 0.5 MG tablet 2 in the AM, 1 at noon, 1 at 5pm   tamsulosin (FLOMAX) 0.4 MG CAPS capsule Take 0.4 mg by mouth daily.     Objective:   PHYSICAL EXAMINATION:    VITALS:   Vitals:   06/26/22 1536  BP: 110/68  Pulse: 81  SpO2: 97%  Weight: 232 lb (105.2 kg)  Height: '6\' 3"'$  (1.905 m)     GEN:  The patient appears stated age and is in NAD. HEENT:  Normocephalic, atraumatic.  The mucous membranes are moist. The superficial temporal arteries are without ropiness or tenderness. CV:  RRR Lungs:  CTAB Neck/HEME:  There are no carotid bruits bilaterally.  Neurological examination:  Orientation: The patient is alert and  oriented x3. Cranial nerves: There is good facial symmetry with mild facial hypomimia. The speech is fluent and clear. Soft palate rises symmetrically and there is no tongue deviation. Hearing is intact to conversational tone. Sensation: Sensation is intact to light touch throughout Motor: Strength is at least antigravity x4.  Movement examination: Tone: There is mod to severe increased tone in the RUE (hasn't taken med since 6:30 am and seen at 4pm).   Abnormal movements: there is RUE rest tremor that increases with distraction.   Coordination:  There is mild to mod decremation with RAM's, R>L Gait and Station: The patient is flexed at waist, with some festination with the walker.    I have reviewed and interpreted the following labs independently    Chemistry      Component Value Date/Time   NA 138 04/06/2022 1515   K 5.2 (H) 04/06/2022 1515   CL 101 04/06/2022 1515   CO2 25 04/06/2022 1515   BUN 19 04/06/2022 1515   CREATININE 0.97 04/06/2022 1515      Component Value Date/Time   CALCIUM 9.7 04/06/2022 1515   ALKPHOS 44 08/09/2010 0220   AST 45 (H) 08/09/2010 0220   ALT 54 (H) 08/09/2010 0220   BILITOT 0.9 08/09/2010 0220       Lab Results  Component Value Date   WBC 8.6 03/07/2016   HGB 13.3 03/07/2016   HCT 40.5 03/07/2016   MCV 89.8 03/07/2016   PLT 244 03/07/2016    Lab Results  Component Value Date   TSH 0.712 05/24/2010     Total time spent on today's visit was 35 minutes, including both face-to-face time and nonface-to-face time.  Time included that spent on review of records (prior notes available to me/labs/imaging if pertinent), discussing treatment and goals, answering patient's questions and coordinating care.  Cc:  Prince Solian, MD

## 2022-06-26 ENCOUNTER — Ambulatory Visit (INDEPENDENT_AMBULATORY_CARE_PROVIDER_SITE_OTHER): Payer: BC Managed Care – PPO | Admitting: Neurology

## 2022-06-26 ENCOUNTER — Encounter: Payer: Self-pay | Admitting: Neurology

## 2022-06-26 VITALS — BP 110/68 | HR 81 | Ht 75.0 in | Wt 232.0 lb

## 2022-06-26 DIAGNOSIS — G20A1 Parkinson's disease without dyskinesia, without mention of fluctuations: Secondary | ICD-10-CM

## 2022-06-26 MED ORDER — CARBIDOPA-LEVODOPA 25-100 MG PO TABS: 1.0000 | ORAL_TABLET | Freq: Three times a day (TID) | ORAL | 1 refills | Status: DC

## 2022-06-26 NOTE — Patient Instructions (Addendum)
Take pramipexole 0.5 mg, 2 at 7am, 1 at 11am, 1 at 4pm  Start Carbidopa Levodopa as follows: Take 1/2 tablet three times daily, at least 30 minutes before meals (approximately 7am/11am/4pm), for one week Then take 1/2 tablet in the morning, 1/2 tablet in the afternoon, 1 tablet in the evening, at least 30 minutes before meals, for one week Then take 1/2 tablet in the morning, 1 tablet in the afternoon, 1 tablet in the evening, at least 30 minutes before meals, for one week Then take 1 tablet three times daily at 7am/11am/4pm, at least 30 minutes before meals   As a reminder, carbidopa/levodopa can be taken at the same time as a carbohydrate, but we like to have you take your pill either 30 minutes before a protein source or 1 hour after as protein can interfere with carbidopa/levodopa absorption.  Local and Online Resources for Power over Parkinson's Group September 2023  LOCAL [place] PARKINSON'S GROUPS  Power over Parkinson's Group:   Power Over Parkinson's Patient Education Group will be Wednesday, September 13th-*Hybrid meting*- in person at [facility] location and via [facility] at 2 pm.   Upcoming Power over [employer] Meetings:  2nd Wednesdays of the month at 2 pm:  September 13th, October 11th, November 8th Contact Amy Marriott at [email or web address] if interested in participating in this group Parkinson's Care Partners Group:    3rd Mondays, Contact Misty Paladino Atypical Parkinsonian Patient Group:   4th Wednesdays, Contact Misty Paladino If you are interested in participating in these groups with Misty, please contact her directly for how to join those meetings.  Her contact information is [email or web address].    LOCAL EVENTS AND NEW OFFERINGS New PWR! Moves Dynegy Instructor-Led Classes offering at [employer]!  Wednesdays 1-2 pm.   Contact Vonna Kotyk at  [email or web address] or Caron Presume at [place],  [email or web address] Dance for Parkinson 's classes will be on Tuesdays 9:30am-10:30am starting October 3-December 12 with a break the week of November 21 . Located in the [employer]  which is in the first floor of the [employer] ([place] for Parkinson's will be held on 2nd and 4th Mondays at 11:00am . First class will start  September 25th.  Located at the [place] ([place].) Through support from the [place] and [place] for Parkinson's classes are free for both patients and caregivers.  Contact Misty Taylor-Paladino for more details about registering.  [place]:  [email or web address] PD Health at Home continues:  Mindfulness Mondays, Wellness Wednesdays, Fitness Fridays  Upcoming Education:   Navigating Nutrition with PD.  Wednesday, Sept. 6th 1:00-2:00 pm Understanding Mind and Memory.  Wednesday, Sept. 20th 1:00-2:00 pm  Expert Briefing:    Parkinson's Disease and the Bladder.  Wednesday, Sept. 13th 1:00-2:00 pm Parkinson's and the Gut-Brain Connection.  Wednesday, Oct. 11th 1:00-2:00 pm Register for expert briefings (webinars) at [email or web address] Please check out their website to sign up for emails and see their full online offerings   [place]:  [email or web address]  Third Thursday Webinars:  On the third Thursday of every month at 12 p.m. ET, join our free live webinars to learn about various aspects of living with Parkinson's disease and our work to speed medical breakthroughs. Upcoming Webinar:  Stay tuned Check out additional information on their website to see their full online offerings  [employer]:  [email or web address] Upcoming Webinar:   Stay  tuned Webinar Series:  Living with Parkinson's Meetup.    Third Thursdays each month, 3 pm Care Partner Monthly Meetup.  With Robin Searing Phinney.  First Tuesday of each month, 2 pm Check out additional information to Live Well Today on their website  Parkinson and Movement Disorders (PMD) Alliance:  www.pmdalliance.org [employer] Online:  Online Education Events Sign up for emails, which are sent weekly to give you updates on programming and online offerings  Parkinson's Association of the [place]:  www.[email or web address] Information on online support groups, education events, and online exercises including Yoga, Parkinson's exercises and more-LOTS of information on links to PD resources and online events Virtual Support Group through Parkinson's Association of the [place]; next one is scheduled for Wednesday, October 4th at 2 pm. (No September meeting due to the symposium.  These are typically scheduled for the 1st Wednesday of the month at 2 pm).  Visit website for details. Register for "Caring for Parkinson's-Caring for You", 9th Annual Symposium.  In-person event in [place].  September 9th.  To register:  [email or web address].org/symposium-registration/?blm_aid=[email or web address] MOVEMENT AND EXERCISE OPPORTUNITIES PWR! Moves Classes at [place].  Wednesdays 10 and 11 am.   Contact Amy Gerrit Friends, PT [email or web address] if interested. NEW PWR! Moves Class offerings at [employer].  Wednesdays 1-2 pm.  Contact Vonna Kotyk at  [email or web address] or Caron Presume at [place],  [email or web address] Parkinson's Wellness Recovery (PWR! Moves)  www.[email or web address] Info on the PWR! Virtual Experience:  You will have access to our expertise through self-assessment, guided plans that start with the PD-specific fundamentals, educational content, tips, Q&A with an expert, and a growing Art therapist of PD-specific pre-recorded and live exercise classes of varying types and intensity - both physical and cognitive! If  that is not enough, we offer 1:1 wellness consultations (in-person or virtual) to personalize your PWR! Research scientist (medical).  [place] Fridays:  As part of the PD Health @ Home program, this free video series focuses each week on one aspect of fitness designed to support people living with Parkinson's.  These weekly videos highlight the [place] recent fitness guidelines for people with Parkinson's disease. ModemG[employer].si Dance for PD website is offering free, live-stream classes throughout the week, as well as links to [employer] of classes:  [email or web address]/ Virtual dance and Pilates for Parkinson's classes: Click on the Community Tab> Parkinson's Movement Initiative Tab.  To register for classes and for more information, visit [email or web address] and click the "community" tab.  [employer] Parkinson's Cycling Classes  [employer]:  Thursdays @ Noon-Live classes at [employer] ([employer] at [email or web address] or [phone]) [employer]: Virtual Classes Mondays and Thursdays Jeanette Caprice classes Tuesday, Wednesday and Thursday (contact Shannon at [email or web address]  or [phone]) [place] Varied levels of classes are offered Tuesdays and Thursdays at [facility].  Stretching with Verdis Frederickson weekly class is also offered for people with Parkinson's To observe a class or for more information, call [phone] or email Hezzie Bump at [email or web address] ADDITIONAL SUPPORT AND RESOURCES Well[employer]Spring [employer]:Online Caregiver Education Opportunities:  www.well-[email or web address]/caregiver-education/caregiver-support-group.  You may also contact Vickki Muff at [email or web address] or [phone].    Coping with Difficult Caregiver Emotions.  Wednesday, September 20th, 10:30 am-12.  The [facility], [facility]  Collective Navigating the Maze of Senior Care Options.  Thursday, September 28th, 4-5:15 pm.  The [facility]. Well-Spring Navigator:  [facility] program, a free service to help individuals and families through the journey of determining care for older adults.  The "Navigator" is a [employer], Education officer, museum  [place], who will speak with a prospective client and/or loved ones to provide an assessment of the situation and a set of recommendations for a personalized care plan -- all free of charge, and whether Well[employer]Spring [employer] offers the needed service or not. If the need is not a service we provide, we are well-connected with reputable programs in town that we can refer you to.  www.well-springsolutions.org or to speak with the Navigator, call [phone].

## 2022-06-30 ENCOUNTER — Ambulatory Visit (HOSPITAL_COMMUNITY)
Admission: EM | Admit: 2022-06-30 | Discharge: 2022-06-30 | Disposition: A | Discharge status: Home / Self Care | Payer: BC Managed Care – PPO | Attending: Emergency Medicine | Admitting: Emergency Medicine

## 2022-06-30 ENCOUNTER — Encounter (HOSPITAL_COMMUNITY): Payer: Self-pay

## 2022-06-30 DIAGNOSIS — E1142 Type 2 diabetes mellitus with diabetic polyneuropathy: Secondary | ICD-10-CM

## 2022-06-30 DIAGNOSIS — E1159 Type 2 diabetes mellitus with other circulatory complications: Secondary | ICD-10-CM | POA: Diagnosis not present

## 2022-06-30 DIAGNOSIS — I872 Venous insufficiency (chronic) (peripheral): Secondary | ICD-10-CM | POA: Diagnosis not present

## 2022-06-30 DIAGNOSIS — G629 Polyneuropathy, unspecified: Secondary | ICD-10-CM | POA: Insufficient documentation

## 2022-06-30 DIAGNOSIS — M7989 Other specified soft tissue disorders: Secondary | ICD-10-CM | POA: Insufficient documentation

## 2022-06-30 MED ORDER — CEPHALEXIN 500 MG PO CAPS: 500.0000 mg | ORAL_CAPSULE | Freq: Four times a day (QID) | ORAL | 0 refills | Status: AC

## 2022-06-30 NOTE — ED Provider Notes (Signed)
[facility]    CSN: [id number] Arrival date & time: 06/30/22  1318      History   Chief Complaint Chief Complaint  Patient presents with   Skin Ulcer    HPI Douglas Price is a 71 y.o. male.   Presents with concern for left leg ulcer Reports few day history, noticed an area on outer leg He denies any pain in the leg or foot, but has history of neuropathy and usually cannot feel these areas.  Type 2 diabetes, does not take sugars at home, does follow with primary care  No fevers.  His sister felt the area around the ulcer was hot  Some lower leg swelling as well.  Denies any recent hospitalization, immobilization, long trips He is a smoker  Past Medical History:  Diagnosis Date   Allergic rhinitis due to pollen    Benign neoplasm of colon    Blisters with epidermal loss due to burn (second degree) of foot    Complication of anesthesia    pt woke up during saphenous vein surgery-had epidural   Constipation due to pain medication    Degeneration of lumbar or lumbosacral intervertebral disc    Diverticulosis of colon (without mention of hemorrhage)    DM (diabetes mellitus) ([place])    Esophageal reflux    Hypertrophy of prostate with urinary obstruction and other lower urinary tract symptoms (LUTS)    Loss of weight    Neuromuscular disorder ([place])    peripheral neuropathy   Obesity, unspecified    Pain in limb    Restless legs syndrome (RLS)    Sleep disturbance 03/24/2014   Spinal stenosis, unspecified region other than cervical    Tobacco use disorder    Unspecified disease of pericardium    Unspecified essential hypertension    Unspecified local infection of skin and subcutaneous tissue    Varices of other sites     Patient Active Problem List   Diagnosis Date Noted   Parkinson's disease 11/25/2017   Diabetic polyneuropathy associated with type 2 diabetes mellitus ([place]) 11/25/2017   Spinal stenosis of lumbar region with neurogenic claudication  11/25/2017   S/P lumbar spinal fusion 03/14/2016   S/P lumbar laminectomy 09/08/2015   Sleep disturbance 03/24/2014    Past Surgical History:  Procedure Laterality Date   CATARACT EXTRACTION Bilateral    one in july and other in Aug.2023   COLONOSCOPY W/ POLYPECTOMY     CYST EXCISION Left 04/09/2022   Procedure: LEFT INDEX FINGER MUCOID CYST EXCISION;  Surgeon: Leanora Cover, MD;  Location: [facility];  Service: Orthopedics;  Laterality: Left;  Bier block   LUMBAR LAMINECTOMY/DECOMPRESSION MICRODISCECTOMY Bilateral 09/08/2015   Procedure: Laminectomy and Foraminotomy - Lumbar two-lumbar three bilateral;  Surgeon: Eustace Moore, MD;  Location: [facility];  Service: Neurosurgery;  Laterality: Bilateral;   SAPHENOUS VEIN GRAFT RESECTION Right 09/24/1989   TENDON EXPLORATION Left 04/09/2022   Procedure: DEBRIDEMENT DISTAL INTERPHALANGEAL JOINT LEFT INDEX FINGER;  Surgeon: Leanora Cover, MD;  Location: [facility];  Service: Orthopedics;  Laterality: Left;  Bier block   TONSILLECTOMY         Home Medications    Prior to Admission medications   Medication Sig Start Date End Date Taking? Authorizing Provider  cephALEXin (KEFLEX) 500 MG capsule Take 1 capsule (500 mg total) by mouth 4 (four) times daily for 5 days. 06/30/22 07/05/22 Yes Nyelli Samara, Wells Guiles, PA-C  buPROPion (WELLBUTRIN XL) 300 MG 24 hr tablet  TAKE 1 TABLET(300 MG) BY MOUTH DAILY AS NEEDED 06/18/22   [name], Eustace Quail, DO  carbidopa-levodopa (SINEMET IR) 25-100 MG tablet Take 1 tablet by mouth 3 (three) times daily. 7am/11am/4pm 06/26/22   [name], Eustace Quail, DO  DULoxetine (CYMBALTA) 60 MG capsule Take 120 mg by mouth daily.     [provider]  dutasteride (AVODART) 0.5 MG capsule Take 0.5 mg by mouth daily.    [provider]  fluticasone (FLONASE) 50 MCG/ACT nasal spray PLACE 2 SPRAYS INTO EACH NOSTRIL ONCE DAILY Patient not taking: Reported on 06/26/2022 11/06/17   [provider]   HYDROcodone-acetaminophen (NORCO/VICODIN) 5-325 MG tablet Twice daily. Patient not taking: Reported on 06/26/2022 12/24/11   [provider]  HYDROcodone-acetaminophen (NORCO/VICODIN) 5-325 MG tablet 1-2 tabs PO q6 hours prn pain 04/09/22   Leanora Cover, MD  loratadine (CLARITIN) 10 MG tablet Take 10 mg by mouth daily.    [provider]  metFORMIN (GLUCOPHAGE) 500 MG tablet Take 500 mg by mouth daily with breakfast.    [provider]  mirtazapine (REMERON) 15 MG tablet Take 15 mg by mouth at bedtime.    [provider]  nebivolol (BYSTOLIC) 5 MG tablet TK 1 T PO D 11/04/17   [provider]  omeprazole (PRILOSEC) 20 MG capsule Take 20 mg by mouth 2 (two) times daily before a meal.     [provider]  pramipexole (MIRAPEX) 0.5 MG tablet 2 in the AM, 1 at noon, 1 at 5pm 12/22/21   [name], Jauan Wohl S, DO  pregabalin (LYRICA) 75 MG capsule Take 75 mg by mouth 2 (two) times daily.    [provider]  tamsulosin (FLOMAX) 0.4 MG CAPS capsule Take 0.4 mg by mouth daily.    [provider]    Family History Family History  Problem Relation Age of Onset   Heart failure Mother    Lung cancer Father    Neuropathy Brother    Hypertension Paternal Grandfather    Tuberculosis Maternal Uncle    Cancer - Colon Maternal Uncle     Social History Social History   Tobacco Use   Smoking status: Some Days    Types: Pipe   Smokeless tobacco: Never   Tobacco comments:    moderate, 1-2bowls a day or more  Vaping Use   Vaping Use: Never used  Substance Use Topics   Alcohol use: No    Alcohol/week: 0.0 standard drinks of alcohol   Drug use: No     Allergies   Avelox [moxifloxacin], Quinolones, and Lexapro [escitalopram]   Review of Systems Review of Systems Per HPI  Physical Exam Triage Vital Signs ED Triage Vitals  Enc Vitals Group     BP 06/30/22 1330 112/72     Pulse Rate 06/30/22 1330 82     Resp 06/30/22 1330 16      Temp 06/30/22 1330 (!) 97.4 F (36.3 C)     Temp Source 06/30/22 1330 Oral     SpO2 06/30/22 1330 96 %     Weight --      Height --      Head Circumference --      Peak Flow --      Pain Score 06/30/22 1331 0     Pain Loc --      Pain Edu? --      Excl. in [place]? --    No data found.  Updated Vital Signs BP 112/72 (BP Location: Right Arm)  Pulse 82   Temp (!) 97.4 F (36.3 C) (Oral)   Resp 16   SpO2 96%     Physical Exam Vitals and nursing note reviewed.  Constitutional:      General: He is not in acute distress.    Appearance: Normal appearance. He is not ill-appearing or diaphoretic.  Cardiovascular:     Rate and Rhythm: Normal rate and regular rhythm.     Pulses: Normal pulses.          Dorsalis pedis pulses are 2+ on the right side and 2+ on the left side.     Heart sounds: Normal heart sounds.  Pulmonary:     Effort: Pulmonary effort is normal.     Breath sounds: Normal breath sounds.  Musculoskeletal:     Right lower leg: Swelling present. No tenderness. 3+ Edema present.     Left ankle: Swelling present. No tenderness.       Legs:     Comments: Venous ulcer lower lateral leg. Left leg significantly swollen compared with right. Erythema surrounding, not warm to touch.  Feels cool.  A couple other areas of skin break.  Swelling of the bilateral ankles, more prominently the left. Some erythema and skin breaks right LE  Feet:     Right foot:     Skin integrity: Skin integrity normal.     Left foot:     Skin integrity: Skin integrity normal.     Comments: Bilat feet without ulcers, sensation decreased throughout  Skin:    General: Skin is cool and dry.     Capillary Refill: Capillary refill takes less than 2 seconds.     Findings: Erythema and wound present.  Neurological:     Mental Status: He is alert.    UC Treatments / Results  Labs (all labs ordered are listed, but only abnormal results are displayed) Labs Reviewed - No data to  display  EKG   Radiology No results found.  Procedures Procedures (including critical care time)  Medications Ordered in UC Medications - No data to display  Initial Impression / Assessment and Plan / UC Course  I have reviewed the triage vital signs and the nursing notes.  Pertinent labs & imaging results that were available during my care of the patient were reviewed by me and considered in my medical decision making (see chart for details).  Venous stasis dermatitis with ulcer Discussed treatment options including outpatient antibiotic and ultrasound versus being evaluated in the ED today. At this time I feel patient is stable for outpatient treatment given he is afebrile and well-appearing. Will cover for possible infection with Keflex 4 times daily for 5 days Outpatient ultrasound tomorrow morning at 11 AM to evaluate vasculature and possible cause of leg swelling.  Discussed following up with primary care regarding diabetes management.  Emergency department for any worsening symptoms.  Patient agrees to plan  Final Clinical Impressions(s) / UC Diagnoses   Final diagnoses:  Venous stasis dermatitis of left lower extremity  Type 2 diabetes mellitus with other circulatory complication, without long-term current use of insulin (HCC)  Peripheral polyneuropathy  Left leg swelling     Discharge Instructions      Please take the antibiotic as prescribed, 4 times daily for 5 days.  Go to the ED tomorrow morning at 11 am to have ultrasound completed.  Please follow up with your primary care provider regarding diabetes management.  Please go to the emergency department if symptoms worsen or you  develop fever.    ED Prescriptions     Medication Sig Dispense Auth. Provider   cephALEXin (KEFLEX) 500 MG capsule Take 1 capsule (500 mg total) by mouth 4 (four) times daily for 5 days. 20 capsule Shaylon Gillean, Wells Guiles, PA-C      PDMP not reviewed this encounter.   Aubreana Cornacchia,  Wells Guiles, [place] 06/30/22 1457

## 2022-06-30 NOTE — ED Triage Notes (Signed)
Patient with c/o ulcers on legs that need to be checked out. Open area to lower left leg.

## 2022-06-30 NOTE — Discharge Instructions (Addendum)
Please take the antibiotic as prescribed, 4 times daily for 5 days.  Go to the ED tomorrow morning at 11 am to have ultrasound completed.  Please follow up with your primary care provider regarding diabetes management.  Please go to the emergency department if symptoms worsen or you develop fever.

## 2022-07-01 ENCOUNTER — Ambulatory Visit (HOSPITAL_BASED_OUTPATIENT_CLINIC_OR_DEPARTMENT_OTHER)
Admission: RE | Admit: 2022-07-01 | Discharge: 2022-07-01 | Disposition: A | Discharge status: Home / Self Care | Payer: BC Managed Care – PPO | Source: Ambulatory Visit

## 2022-07-01 DIAGNOSIS — I872 Venous insufficiency (chronic) (peripheral): Secondary | ICD-10-CM | POA: Diagnosis not present

## 2022-07-01 DIAGNOSIS — E1159 Type 2 diabetes mellitus with other circulatory complications: Secondary | ICD-10-CM | POA: Diagnosis not present

## 2022-07-01 DIAGNOSIS — M7989 Other specified soft tissue disorders: Secondary | ICD-10-CM

## 2022-07-01 DIAGNOSIS — L0390 Cellulitis, unspecified: Secondary | ICD-10-CM | POA: Diagnosis not present

## 2022-07-01 DIAGNOSIS — L538 Other specified erythematous conditions: Secondary | ICD-10-CM

## 2022-07-01 DIAGNOSIS — G629 Polyneuropathy, unspecified: Secondary | ICD-10-CM | POA: Diagnosis not present

## 2022-07-01 NOTE — Progress Notes (Signed)
VASCULAR LAB    Left lower extremity venous duplex has been performed.  See CV proc for preliminary results.   Deija Buhrman, RVT 07/01/2022, 11:40 AM

## 2022-07-25 ENCOUNTER — Other Ambulatory Visit: Payer: Self-pay | Admitting: Orthopedic Surgery

## 2022-09-11 NOTE — Progress Notes (Signed)
I spoke with Hassan Rowan at Dr. Levell July office and she informed me that this pt plans to CX this procedure and reschedule for another time.

## 2022-09-13 ENCOUNTER — Ambulatory Visit (HOSPITAL_BASED_OUTPATIENT_CLINIC_OR_DEPARTMENT_OTHER)
Admission: RE | Admit: 2022-09-13 | Payer: BC Managed Care – PPO | Source: Home / Self Care | Admitting: Orthopedic Surgery

## 2022-09-13 ENCOUNTER — Encounter (HOSPITAL_BASED_OUTPATIENT_CLINIC_OR_DEPARTMENT_OTHER): Admission: RE | Payer: Self-pay | Source: Home / Self Care

## 2022-09-13 SURGERY — CYST REMOVAL
Anesthesia: Choice | Laterality: Right

## 2022-10-12 DIAGNOSIS — H01001 Unspecified blepharitis right upper eyelid: Secondary | ICD-10-CM | POA: Diagnosis not present

## 2022-10-12 DIAGNOSIS — H04121 Dry eye syndrome of right lacrimal gland: Secondary | ICD-10-CM | POA: Diagnosis not present

## 2022-12-25 ENCOUNTER — Ambulatory Visit: Payer: BC Managed Care – PPO | Admitting: Neurology

## 2023-01-03 ENCOUNTER — Telehealth: Payer: Self-pay

## 2023-01-03 NOTE — Telephone Encounter (Signed)
MEDICATION:buPROPion (WELLBUTRIN XL) 300 MG 24 hr tablet   PHARMACY:[id number] - , [place] - [address] AT [facility] [address] (Ph: [phone])   Comments: Patient is calling in stating he lost the prescription, also wondering if Dr.[name] would prescribe something for sleep.   **Let patient know to contact pharmacy at the end of the day to make sure medication is ready. **  ** Please notify patient to allow 48-72 hours to process**  **Encourage patient to contact the pharmacy for refills or they can request refills through [facility]**

## 2023-01-04 ENCOUNTER — Other Ambulatory Visit: Payer: Self-pay

## 2023-01-04 MED ORDER — BUPROPION HCL ER (XL) 150 MG PO TB24: ORAL_TABLET | ORAL | 0 refills | Status: DC

## 2023-01-04 MED ORDER — BUPROPION HCL ER (XL) 300 MG PO TB24: ORAL_TABLET | ORAL | 1 refills | Status: DC

## 2023-01-04 NOTE — Telephone Encounter (Signed)
Pt does not have a My Chart , so I called and informed him is the way to take his meds , Left it on voice mail. Told him to call office if any questions or concerns.

## 2023-01-28 ENCOUNTER — Telehealth: Payer: Self-pay | Admitting: Neurology

## 2023-01-28 NOTE — Telephone Encounter (Signed)
Pt called in stating he had stopped taking his carbidopa levodopa and pramipexole about a month ago without meaning to. He would like to speak with someone about how to go about starting it back. He stated it's ok to leave a message if he doesn't answer.

## 2023-01-29 NOTE — Telephone Encounter (Signed)
Per Dr [name] we can offer 01-30-23 at 8:45 as VV

## 2023-01-29 NOTE — Telephone Encounter (Signed)
Sent up front for scheduling a VV to discuss restarting medications

## 2023-01-29 NOTE — Telephone Encounter (Signed)
I left patient a VM about this

## 2023-01-31 ENCOUNTER — Other Ambulatory Visit: Payer: Self-pay | Admitting: Neurology

## 2023-01-31 ENCOUNTER — Encounter: Payer: Self-pay | Admitting: Podiatry

## 2023-01-31 ENCOUNTER — Ambulatory Visit (INDEPENDENT_AMBULATORY_CARE_PROVIDER_SITE_OTHER): Payer: BC Managed Care – PPO | Admitting: Podiatry

## 2023-01-31 DIAGNOSIS — E1142 Type 2 diabetes mellitus with diabetic polyneuropathy: Secondary | ICD-10-CM | POA: Diagnosis not present

## 2023-01-31 DIAGNOSIS — B351 Tinea unguium: Secondary | ICD-10-CM | POA: Diagnosis not present

## 2023-01-31 DIAGNOSIS — M79674 Pain in right toe(s): Secondary | ICD-10-CM | POA: Diagnosis not present

## 2023-01-31 DIAGNOSIS — M79675 Pain in left toe(s): Secondary | ICD-10-CM | POA: Diagnosis not present

## 2023-01-31 NOTE — Progress Notes (Signed)
This patient returns to my office for at risk foot care.  This patient requires this care by a professional since this patient will be at risk due to having diabetes and Parkinson.  This patient is unable to cut nails himself since the patient cannot reach his nails.These nails are painful walking and wearing shoes.  This patient presents for at risk foot care today.  General Appearance  Alert, conversant and in no acute stress.  Vascular  Dorsalis pedis and posterior tibial  pulses are palpable  bilaterally.  Capillary return is within normal limits  bilaterally. Temperature is within normal limits  bilaterally.  Neurologic  Senn-Weinstein monofilament wire test within normal limits  bilaterally. Muscle power within normal limits bilaterally.  Nails Thick disfigured discolored nails with subungual debris  hallux nails bilaterally. No evidence of bacterial infection or drainage bilaterally.  Orthopedic  No limitations of motion  feet .  No crepitus or effusions noted.  No bony pathology or digital deformities noted.  Skin  normotropic skin with no porokeratosis noted bilaterally.  No signs of infections or ulcers noted.     Onychomycosis  Pain in right toes  Pain in left toes  Consent was obtained for treatment procedures.   Mechanical debridement of nails 1-5  bilaterally performed with a nail nipper.  Filed with dremel without incident. Patient has numerous healing lesions on his legs.  He presents to the office with bleeding from skin lesion right medial malleolus area.  This site was cleaned and DSD applied.   Return office visit   3 months                   Told patient to return for periodic foot care and evaluation due to potential at risk complications.   Helane Gunther DPM

## 2023-02-07 ENCOUNTER — Ambulatory Visit (INDEPENDENT_AMBULATORY_CARE_PROVIDER_SITE_OTHER): Payer: BC Managed Care – PPO | Admitting: Physician Assistant

## 2023-02-07 ENCOUNTER — Encounter: Payer: Self-pay | Admitting: Physician Assistant

## 2023-02-07 VITALS — BP 119/74 | HR 79 | Ht 75.0 in | Wt 209.0 lb

## 2023-02-07 DIAGNOSIS — G20A1 Parkinson's disease without dyskinesia, without mention of fluctuations: Secondary | ICD-10-CM | POA: Diagnosis not present

## 2023-02-07 DIAGNOSIS — Z981 Arthrodesis status: Secondary | ICD-10-CM

## 2023-02-07 DIAGNOSIS — Z9889 Other specified postprocedural states: Secondary | ICD-10-CM

## 2023-02-07 DIAGNOSIS — G479 Sleep disorder, unspecified: Secondary | ICD-10-CM

## 2023-02-07 DIAGNOSIS — F331 Major depressive disorder, recurrent, moderate: Secondary | ICD-10-CM | POA: Diagnosis not present

## 2023-02-07 DIAGNOSIS — F411 Generalized anxiety disorder: Secondary | ICD-10-CM | POA: Diagnosis not present

## 2023-02-07 DIAGNOSIS — E1142 Type 2 diabetes mellitus with diabetic polyneuropathy: Secondary | ICD-10-CM | POA: Diagnosis not present

## 2023-02-07 DIAGNOSIS — M48062 Spinal stenosis, lumbar region with neurogenic claudication: Secondary | ICD-10-CM | POA: Diagnosis not present

## 2023-02-07 MED ORDER — TEMAZEPAM 15 MG PO CAPS: 15.0000 mg | ORAL_CAPSULE | Freq: Every evening | ORAL | 1 refills | Status: DC | PRN

## 2023-02-07 NOTE — Progress Notes (Signed)
[facility] MD/PA/NP Initial Note  02/07/2023 1:17 PM Douglas Price  MRN:  [id number]  Chief Complaint:  Chief Complaint   Establish Care    HPI:  He is here to discuss depression.  Started getting worse around spring break, about 6 weeks ago.  He feels it may be related to the fact that he knows the end of the school year is coming and he will not be doing anything over the summer.  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  He has been on Cymbalta for quite some time, he does not know exactly how long.  He was restarted on Wellbutrin by Dr. Arbutus Leas about 5 weeks ago since the depression had worsened.  He was on XL 150 mg daily for 1 month and has been on 300 mg daily for approximately 1 week.  States he is feeling better, difficult to say how much but he is responding to the Wellbutrin.    He enjoys going to church, spending time with his family.  He enjoys reading.  Lives history.  He has a brother and sister who usually come to visit several times a month, they bring food.  Because of the Parkinson's disease, he is not able to cook or do things around the house like he used to.  He is able to walk with a walker but the tremor in his right, dominant hand makes it difficult to write, pay bills or do other things that need a steady hand.  Energy and motivation are stable.  Personal hygiene is normal.  Appetite is normal and weight is stable.  Denies suicidal or homicidal thoughts.  Has anxiety, more generalized than panic attacks.  Has been on Klonopin for an unknown period of time.  It helps when he gets overwhelmed.  He also has trouble sleeping and takes temazepam for that.  He has been taking the 2 together for a long time.  If he does not take the temazepam he has trouble falling asleep and staying asleep.  Now feels that sleep is adequate.  Patient denies  increased energy with decreased need for sleep, increased talkativeness, racing thoughts, impulsivity or risky behaviors, increased spending, increased libido, grandiosity, increased irritability or anger, paranoia, or hallucinations.  He sees Dr. [name], neurology, for the Parkinson's.  He is off pramipexole and carbidopa/levodopa, reports that it was not helping so he did not see a reason to continue taking it.  However a phone message to Dr. Arbutus Leas on 5/6 states that he went off those medications approximately 1 month prior without meaning to.  He wanted to get back on those meds.  He has an appointment with her sometime within the next month.  Visit Diagnosis:    ICD-10-CM   1. Major depressive disorder, recurrent episode, moderate (HCC)  F33.1     2. Generalized anxiety disorder  F41.1     3. Parkinson's disease without dyskinesia or fluctuating manifestations  G20.A1     4. Sleep disturbance  G47.9     5. S/P lumbar spinal fusion  Z98.1     6. S/P lumbar laminectomy  Z98.890     7. Spinal stenosis of lumbar region with neurogenic claudication  M48.062     8. Diabetic polyneuropathy associated with type 2 diabetes mellitus (HCC)  E11.42       Past Psychiatric History:  Past medications for mental health diagnoses include: Cymbalta, Wellbutrin, Abilify, Klonopin, Xanax, Temazipam  Past Medical History:  Past Medical History:  Diagnosis Date   Allergic rhinitis due to pollen    Benign neoplasm of colon    Blisters with epidermal loss due to burn (second degree) of foot    Complication of anesthesia    pt woke up during saphenous vein surgery-had epidural   Constipation due to pain medication    Degeneration of lumbar or lumbosacral intervertebral disc    Diverticulosis of colon (without mention of hemorrhage)    DM (diabetes mellitus) (HCC)    Esophageal reflux    Hypertrophy of prostate with urinary obstruction and other lower urinary tract symptoms (LUTS)    Loss of  weight    Neuromuscular disorder (HCC)    peripheral neuropathy   Obesity, unspecified    Pain in limb    Restless legs syndrome (RLS)    Sleep disturbance 03/24/2014   Spinal stenosis, unspecified region other than cervical    Tobacco use disorder    Unspecified disease of pericardium    Unspecified essential hypertension    Unspecified local infection of skin and subcutaneous tissue    Varices of other sites     Past Surgical History:  Procedure Laterality Date   BACK SURGERY     CATARACT EXTRACTION Bilateral    one in july and other in Aug.2023   COLONOSCOPY W/ POLYPECTOMY     CYST EXCISION Left 04/09/2022   Procedure: LEFT INDEX FINGER MUCOID CYST EXCISION;  Surgeon: Betha Loa, MD;  Location: [facility];  Service: Orthopedics;  Laterality: Left;  Bier block   LUMBAR LAMINECTOMY/DECOMPRESSION MICRODISCECTOMY Bilateral 09/08/2015   Procedure: Laminectomy and Foraminotomy - Lumbar two-lumbar three bilateral;  Surgeon: Tia Alert, MD;  Location: [facility];  Service: Neurosurgery;  Laterality: Bilateral;   SAPHENOUS VEIN GRAFT RESECTION Right 09/24/1989   TENDON EXPLORATION Left 04/09/2022   Procedure: DEBRIDEMENT DISTAL INTERPHALANGEAL JOINT LEFT INDEX FINGER;  Surgeon: Betha Loa, MD;  Location: [facility];  Service: Orthopedics;  Laterality: Left;  Bier block   TONSILLECTOMY      Family Psychiatric History:  See below   Family History:  Family History  Problem Relation Age of Onset   Heart failure Mother    Lung cancer Father    Neuropathy Brother    Hypertension Paternal Grandfather    Tuberculosis Maternal Uncle    Cancer - Colon Maternal Uncle    Social History:  Social History   Socioeconomic History   Marital status: Single    Spouse name: Not on file   Number of children: 0   Years of education: BA   Highest education level: Not on file  Occupational History    Employer: [employer]    Comment: Schools    Occupation: TEACHER    Employer: [employer]   Occupation: Geologist, engineering    Comment: [employer]  Tobacco Use   Smoking status: Some Days    Types: Pipe   Smokeless tobacco: Never   Tobacco comments:    moderate, 1-2bowls a day or more  Vaping Use   Vaping Use: Never used  Substance and Sexual Activity   Alcohol use: No    Alcohol/week: 0.0 standard drinks of alcohol   Drug use: No   Sexual activity: Not on file  Other Topics Concern   Not on file  Social History Narrative   Grew up in  [place], graduated from [employer] in History. Worked for [employer]. He had a homeschool, or substituted.    Likes to read.    Patient lives at home alone. Sister and brother help with household things as needed.       Caffeine Use: 1/2-1 of coffee/tea daily   Right handed   Religion-anglican, Ephriam Knuckles, grew up [employer]: Low Risk  (02/07/2023)   Overall Financial Resource Strain (CARDIA)    Difficulty of Paying Living Expenses: Not hard at all  Food Insecurity: No Food Insecurity (02/07/2023)   Hunger Vital Sign    Worried About Running Out of Food in the Last Year: Never true    Ran Out of Food in the Last Year: Never true  Transportation Needs: No Transportation Needs (02/07/2023)   PRAPARE - Administrator, Civil Service (Medical): No    Lack of Transportation (Non-Medical): No  Physical Activity: Not on file  Stress: Not on file  Social Connections: Moderately Isolated (02/07/2023)   Social Connection and Isolation Panel [NHANES]    Frequency of Communication with Friends and Family: Three times a week    Frequency of Social Gatherings with Friends and Family: Once a week    Attends Religious Services: More than 4 times per year    Active Member of [employer] or Organizations: No    Attends Banker Meetings: Never    Marital Status: Never married     Allergies:  Allergies  Allergen Reactions   Avelox [Moxifloxacin] Other (See Comments)    Angioedema Fatigue   Quinolones Other (See Comments)    Angioedema   Lexapro [Escitalopram] Other (See Comments)    unknown    Metabolic Disorder Labs: Lab Results  Component Value Date   HGBA1C 6.4 (H) 03/07/2016   MPG 137 03/07/2016   MPG 143 09/08/2015   No results found for: "PROLACTIN" No results found for: "CHOL", "TRIG", "HDL", "CHOLHDL", "VLDL", "LDLCALC" Lab Results  Component Value Date   TSH 0.712 05/24/2010    Therapeutic Level Labs: No results found for: "LITHIUM" No results found for: "VALPROATE" No results found for: "CBMZ"  Current Medications: Current Outpatient Medications  Medication Sig Dispense Refill   buPROPion (WELLBUTRIN XL) 300 MG 24 hr tablet Take one tablet 300 mg daily 90 tablet 1   clonazePAM (KLONOPIN) 0.5 MG tablet Take 0.5 mg by mouth daily.     DULoxetine (CYMBALTA) 60 MG capsule Take 120 mg by mouth daily.      dutasteride (AVODART) 0.5 MG capsule Take 0.5 mg by mouth daily.     HYDROcodone-acetaminophen (NORCO/VICODIN) 5-325 MG tablet      HYDROcodone-acetaminophen (NORCO/VICODIN) 5-325 MG tablet 1-2 tabs PO q6 hours prn pain 15 tablet 0   loratadine (CLARITIN) 10 MG tablet Take 10 mg by mouth daily.     metFORMIN (GLUCOPHAGE) 500 MG tablet Take 500 mg by mouth daily with breakfast.     mirtazapine (REMERON) 15 MG tablet Take 15 mg by mouth at bedtime.     nebivolol (BYSTOLIC) 5 MG tablet TK 1 T PO D     omeprazole (PRILOSEC) 20 MG capsule Take 20 mg by mouth 2 (two) times daily before a meal.      pregabalin (LYRICA) 75 MG capsule Take 75 mg by mouth 2 (two) times daily.     tamsulosin (FLOMAX) 0.4 MG CAPS capsule Take 0.4 mg by mouth daily.  buPROPion (WELLBUTRIN XL) 150 MG 24 hr tablet Take one tablet daily(150 mg ) for the first 30 days. 30 tablet 0   carbidopa-levodopa (SINEMET IR) 25-100 MG tablet Take 1 tablet by mouth 3 (three)  times daily. 7am/11am/4pm (Patient not taking: Reported on 02/07/2023) 270 tablet 1   fluticasone (FLONASE) 50 MCG/ACT nasal spray PLACE 2 SPRAYS INTO EACH NOSTRIL ONCE DAILY (Patient not taking: Reported on 06/26/2022)  0   pramipexole (MIRAPEX) 0.5 MG tablet 2 in the AM, 1 at noon, 1 at 5pm (Patient not taking: Reported on 02/07/2023) 450 tablet 1   temazepam (RESTORIL) 15 MG capsule Take 1 capsule (15 mg total) by mouth at bedtime as needed. 30 capsule 1   No current facility-administered medications for this visit.    Medication Side Effects: none  Orders placed this visit:  No orders of the defined types were placed in this encounter.   Psychiatric Specialty Exam:  Review of Systems  Constitutional:  Positive for diaphoresis.  HENT:  Positive for congestion and rhinorrhea.        From allergies  Eyes:  Positive for discharge.       Allergies  Respiratory:  Positive for cough.        Allergies w/ drainage cause dry hacky cough at times  Cardiovascular: Negative.   Gastrointestinal:  Positive for constipation.  Endocrine: Positive for polydipsia.  Genitourinary: Negative.   Musculoskeletal:  Positive for back pain.  Skin: Negative.   Allergic/Immunologic: Positive for environmental allergies.  Neurological:  Positive for dizziness, tremors and numbness.       Known Parkinsons disease  Hematological: Negative.   Psychiatric/Behavioral:         See HPI    Blood pressure 119/74, pulse 79, height 6\' 3"  (1.905 m), weight 209 lb (94.8 kg).Body mass index is 26.12 kg/m.  General Appearance: Casual and Well Groomed  Eye Contact:  Good  Speech:  Clear and Coherent and Normal Rate  Volume:  Normal  Mood:  Euthymic  Affect:  Congruent  Thought Process:  Goal Directed and Descriptions of Associations: Circumstantial  Orientation:  Full (Time, Place, and Person)  Thought Content: Logical   Suicidal Thoughts:  No  Homicidal Thoughts:  No  Memory:  WNL  Judgement:  Good  Insight:   Good  Psychomotor Activity:  Decreased, Psychomotor Retardation, Tremor, and walks slowly using a roll later walker, has pill-rolling tremor worse on his right hand (dominant).  He rocks several times getting up from a seated position in the waiting room.  In my office, it was extremely difficult for him to get up from the couch which has only one arm for him to push up on.  I had to ask a staff member to help me get him to a standing position.  Concentration:  Concentration: Good  Recall:  Good  Fund of Knowledge: Good  Language: Good  Assets:  Communication Skills Desire for Improvement Financial Resources/Insurance Housing Transportation Vocational/Educational  ADL's:  Impaired  Cognition: WNL  Prognosis:  Good   Screenings:  PHQ2-9    Flowsheet Row Office Visit from 02/07/2023 in [facility]  PHQ-2 Total Score 2  PHQ-9 Total Score 6      Flowsheet Row ED from 06/30/2022 in [facility] Admission (Discharged) from 04/09/2022 in [facility]  C-SSRS RISK CATEGORY No Risk No Risk      Receiving Psychotherapy: No   will restart therapy with Dr. Marliss Czar in near  future  Treatment Plan/Recommendations:  PDMP reviewed. Clonazepam 01/21/2023. Temazepam 01/10/2023. I provided 60 minutes of face to face time during this encounter, including time spent before and after the visit in records review, medical decision making, counseling pertinent to today's visit, and charting.   Discussed the diagnosis and treatment options of depression.  Wellbutrin and Cymbalta are good choices.  He has only been on the current Wellbutrin XL dose for approximately 1 week which is too soon to tell how much improvement he is getting from the higher dose so I recommend he stay on it at least 1 more month before we decide whether an increase is needed or not.  Sleep hygiene discussed.  His current regimen is effective so no changes in that treatment.  I am  happy to start prescribing all of his mental health meds if he prefers and at this time he does.  He takes temazepam and clonazepam and both meds are necessary at this point.  He understands the increased risk of falls and/or confusion and accepts the risks.  I have encouraged him to take the medications prescribed by Dr. Arbutus Leas for the Parkinson's.  It will not only help him now but will be beneficial to his mobility in the future.  He has an appointment with her in a few weeks and will discuss with her then.   Continue Wellbutrin XL 300 mg, 1 p.o. daily. Continue Klonopin 0.5 mg, 1 p.o. daily as needed. Continue Cymbalta 60 mg, 2 p.o. daily. Continue mirtazapine 15 mg 1 p.o. nightly. Continue temazepam 15 mg, 1 p.o. nightly as needed sleep. He has an appointment with Dr. Marliss Czar to restart therapy within the next month or so. Return in 4 weeks.  [name], PA-C

## 2023-02-13 ENCOUNTER — Ambulatory Visit (INDEPENDENT_AMBULATORY_CARE_PROVIDER_SITE_OTHER): Payer: Medicare Other | Admitting: Psychiatry

## 2023-02-13 DIAGNOSIS — F331 Major depressive disorder, recurrent, moderate: Secondary | ICD-10-CM

## 2023-02-13 DIAGNOSIS — G20A1 Parkinson's disease without dyskinesia, without mention of fluctuations: Secondary | ICD-10-CM

## 2023-02-13 DIAGNOSIS — F411 Generalized anxiety disorder: Secondary | ICD-10-CM | POA: Diagnosis not present

## 2023-02-13 DIAGNOSIS — G479 Sleep disorder, unspecified: Secondary | ICD-10-CM

## 2023-02-13 DIAGNOSIS — Z87898 Personal history of other specified conditions: Secondary | ICD-10-CM | POA: Diagnosis not present

## 2023-02-13 NOTE — Progress Notes (Signed)
PROBLEM-FOCUSED INITIAL PSYCHOTHERAPY EVALUATION Marliss Czar, PhD LP [facility]  Name: Douglas Price Date: 02/13/2023 Time spent: 50 min MRN: [id number] DOB: 08-26-1951 Guardian/Payee: self  PCP: Chilton Greathouse, MD Documentation requested on this visit: No  PROBLEM HISTORY Reason for Visit /Presenting Problem:  Chief Complaint  Patient presents with   Establish Care   Anxiety   Other    Degenerative neuro illness   Depression    Narrative/History of Present Illness Referred by self and new psychiatrist, [name], PA-C, for return treatment of anxiety and depression.  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  Hx successful withdrawal at [facility] and went elsewhere for behavioral health services.  Now c. 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  Says dopa meds don't "work" on him, but his account of this is inconsistent.  On challenge, fairly simplistically says it did not remove his symptoms, so he hasn't seen the point in taking them.  Currently under the care of Dr. [name], who does not yet know about noncompliance.  Known to have had the issue come up in initial consultation with [name], with recommendations to resume and inform prescribing neurologist.  Is on hydrocodone for chronic pain, as well, just a late afternoon dose.  And on temazepam for sleep reinforcement, through his PCP, Dr. Felipa Eth.  Also clonazepam QAM.  On Wellbutrin since April of this year.  Hx of Abilify Rx'd by Dr. Felipa Eth, and recalled it as helpful for his depression a couple years ago, but his previous neurologist, in [place], took him off of it, considering it as likely to exacerbate Parkinson's.  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  Says the school has changed demographically over time, not Title I and much larger concentration of minorities.  Feels he gets along well with everyone and seems to command care and respect, but he still wonders whether admin may try to get rid of him.  He is able to ambulate OK for now with rolling walker, just restricted to ground floor.  May get asked how old he is, given his observable limitations.  Had a back injury on the job a while back, while trying to help push a student up into a bus, causing spinal stenosis.  At home, does not use the Rollator, just keeps it in the car.  Generally, plenty to hold onto and brace himself when needed, though he has taken falls at home in the past month.  Typically backwards, non-damaging, and able to get up independently.    Emotionally, has found Feeling Good by Allyson Sabal very helpful in addressing negative thinking, e.g., about being "over the hill" and dealing with debt.  Currently anticipates retiring a $500/mo payment in November.  On Social Security 5 yrs now, while still working at [employer], and would like to continue working past that, provided his health cooperates.  Knows he can't physically intervene breaking up a fight or restraining an Dance movement psychotherapist, but he can be a sentinel to get help.  Also finding that the kids have really taken to him and help him and lok out for him regularly, including Black boys with disciplinary records.  Out of church for a while now.  Used to go to [employer], but felt it drifted too far in a stodgy, Catholic direction.  Social support outside of the job and health care is limited.  Prior Psychiatric Assessment/Treatment:   Outpatient treatment: various Psychiatric hospitalization: substance rehab [facility] Psychological assessment/testing: none stated   Abuse/neglect screening: Victim of  abuse: Not assessed at this time / none suspected.   Victim of neglect: Not assessed at this time / none suspected.   Perpetrator of abuse/neglect: Not assessed at this time / none suspected.   Witness / Exposure to Domestic Violence: Not assessed at this time / none suspected.   Witness to Community Violence:  Not assessed at this time / none suspected.   Protective Services Involvement: No.   Report needed: No.    Substance abuse screening: Current substance abuse: No.   History of impactful substance use/abuse: Yes.     FAMILY/SOCIAL HISTORY Family of origin -- deferred, see prior chart Family of intention/current living situation -- lives alone, non-practicing gay Education -- deferred Vocation -- Recruitment consultant -- stable, semi-fixed Spiritually -- Christian, historically Theatre manager activities -- deferred Other situational factors affecting treatment and prognosis: Stressors from the following areas: Health problems and Medication change or noncompliance Barriers to service: mobility, work hrs  Notable cultural sensitivities: none stated Strengths: Able to Communicate Effectively and psychologically minded, supportive employer   MED/SURG HISTORY Med/surg history was partially reviewed with PT at this time.  Of note for psychotherapy at this time Parkinson's, insufficiently treated. Past Medical History:  Diagnosis Date   Allergic rhinitis due to pollen    Benign neoplasm of colon    Blisters with epidermal loss due to burn (second degree) of foot    Complication of anesthesia    pt woke up during saphenous vein surgery-had epidural   Constipation due to pain medication    Degeneration of lumbar or lumbosacral intervertebral disc    Diverticulosis of colon (without mention of hemorrhage)    DM (diabetes mellitus) (HCC)    Esophageal reflux    Hypertrophy of prostate with urinary obstruction and other lower urinary tract symptoms (LUTS)    Loss of  weight    Neuromuscular disorder (HCC)    peripheral neuropathy   Obesity, unspecified    Pain in limb    Restless legs syndrome (RLS)    Sleep disturbance 03/24/2014   Spinal stenosis, unspecified region other than cervical    Tobacco use disorder    Unspecified disease of pericardium    Unspecified essential hypertension    Unspecified local infection of skin and subcutaneous tissue    Varices of other sites      Past Surgical History:  Procedure Laterality Date   BACK SURGERY     CATARACT EXTRACTION Bilateral    one in july and other in Aug.2023   COLONOSCOPY W/ POLYPECTOMY     CYST EXCISION Left 04/09/2022   Procedure: LEFT INDEX FINGER MUCOID CYST EXCISION;  Surgeon: Betha Loa, MD;  Location: [facility];  Service: Orthopedics;  Laterality: Left;  Bier block   LUMBAR LAMINECTOMY/DECOMPRESSION MICRODISCECTOMY Bilateral 09/08/2015   Procedure: Laminectomy and Foraminotomy - Lumbar two-lumbar three bilateral;  Surgeon: Tia Alert, MD;  Location: [facility];  Service: Neurosurgery;  Laterality: Bilateral;   SAPHENOUS VEIN GRAFT RESECTION Right 09/24/1989   TENDON EXPLORATION Left 04/09/2022   Procedure: DEBRIDEMENT DISTAL INTERPHALANGEAL JOINT LEFT INDEX FINGER;  Surgeon: Merlyn Lot,  Caryn Bee, MD;  Location: [facility];  Service: Orthopedics;  Laterality: Left;  Bier block   TONSILLECTOMY      Allergies  Allergen Reactions   Avelox [Moxifloxacin] Other (See Comments)    Angioedema Fatigue   Quinolones Other (See Comments)    Angioedema   Lexapro [Escitalopram] Other (See Comments)    unknown    Medications (as listed in Epic): Current Outpatient Medications  Medication Sig Dispense Refill   clonazePAM (KLONOPIN) 0.5 MG tablet Take 0.5 mg by mouth daily.     temazepam (RESTORIL) 15 MG capsule Take 1 capsule (15 mg total) by mouth at bedtime as needed. 30 capsule 1   buPROPion (WELLBUTRIN XL) 150 MG 24 hr tablet Take one tablet daily(150 mg )  for the first 30 days. 30 tablet 0   buPROPion (WELLBUTRIN XL) 300 MG 24 hr tablet Take one tablet 300 mg daily 90 tablet 1   carbidopa-levodopa (SINEMET IR) 25-100 MG tablet Take 1 tablet by mouth 3 (three) times daily. 7am/11am/4pm (Patient not taking: Reported on 02/07/2023) 270 tablet 1   DULoxetine (CYMBALTA) 60 MG capsule Take 120 mg by mouth daily.      dutasteride (AVODART) 0.5 MG capsule Take 0.5 mg by mouth daily.     HYDROcodone-acetaminophen (NORCO/VICODIN) 5-325 MG tablet      HYDROcodone-acetaminophen (NORCO/VICODIN) 5-325 MG tablet 1-2 tabs PO q6 hours prn pain 15 tablet 0   loratadine (CLARITIN) 10 MG tablet Take 10 mg by mouth daily.     metFORMIN (GLUCOPHAGE) 500 MG tablet Take 500 mg by mouth daily with breakfast.     mirtazapine (REMERON) 15 MG tablet Take 15 mg by mouth at bedtime.     nebivolol (BYSTOLIC) 5 MG tablet TK 1 T PO D     omeprazole (PRILOSEC) 20 MG capsule Take 20 mg by mouth 2 (two) times daily before a meal.      pramipexole (MIRAPEX) 0.5 MG tablet 2 in the AM, 1 at noon, 1 at 5pm (Patient not taking: Reported on 02/07/2023) 450 tablet 1   pregabalin (LYRICA) 75 MG capsule Take 75 mg by mouth 2 (two) times daily.     tamsulosin (FLOMAX) 0.4 MG CAPS capsule Take 0.4 mg by mouth daily.     No current facility-administered medications for this visit.    MENTAL STATUS AND OBSERVATIONS Appearance:   Casual     Behavior:  Appropriate  Motor:  Requires walker and reasonably high seating , some observable tremor  Speech/Language:   Clear and Coherent and slowed  Affect:  Appropriate  Mood:  dysthymic  Thought process:  normal  Thought content:    WNL  Sensory/Perceptual disturbances:    WNL  Orientation:  Fully oriented  Attention:  Good  Concentration:  Good  Memory:  WNL  Fund of knowledge:   Good  Insight:    Fair  Judgment:   Good  Impulse Control:  Good   Initial Risk Assessment: Danger to self: No Self-injurious behavior: No Danger to others:  No Physical aggression / violence: No Duty to warn: No Access to firearms a concern: No Gang involvement: No Patient / guardian was educated about steps to take if suicide or homicide risk level increases between visits: yes While future psychiatric events cannot be accurately predicted, the patient does not currently require acute inpatient psychiatric care and does not currently meet [facility] involuntary commitment criteria.   DIAGNOSIS:    ICD-10-CM   1. Major depressive disorder, recurrent  episode, moderate (HCC)  F33.1     2. Generalized anxiety disorder  F41.1     3. Parkinson's disease without dyskinesia or fluctuating manifestations  G20.A1     4. Sleep disturbance  G47.9     5. History of benzodiazepine dependence  Z87.898       INITIAL TREATMENT: Support/validation provided for distressing symptoms and confirmed rapport Ethical orientation and informed consent confirmed re: privacy rights -- including but not limited to HIPAA, EMR and use of e-PHI patient responsibilities -- scheduling, fair notice of changes, in-person vs. telehealth and regulatory and financial conditions affecting choice expectations for working relationship in psychotherapy needs and consents for working partnerships and exchange of information with other health care providers, especially any medication and other behavioral health providers Initial orientation to cognitive-behavioral and solution-focused therapy approach Psychoeducation and initial recommendations: Affirmed use of coping tactics since last contact, welcome on returning to psychotherapy and medication management, and assertiveness addressing current concerns. 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  Encouraged to think of himself as such any time he begins to feel useless or over the hill. Supportively challenged decision to go off Parkinson's medication AMA.  Suggest he rethink what he looks for from medication, in this case slowing decline (like a parachute) instead of the cure he fantasized it to be.  Encouraged he prioritize using it, or at least confirming with his prescribing neurologist what to expect from it, and make a positive practice of consulting with his doctor any time he has doubts about medication -- trusting, collaborative working relationship is what he wanted from Dr. Tomasa Rand, so he should make sure he practices it with his successors.   For now, will accept the use of multiple benzodiazepines, but with his history of tolerance and compulsive use, they should be monitored carefully an alternatives pursued. Consult further with psychiatry and neurology for  consensus using a dopaminergic antidepressant ad be ready to have them coordinate as needed.   Outlook for therapy -- scheduling constraints, availability of crisis service, inclusion of family member(s) as appropriate   Plan: Pharmacotherapy compliance -- Resume dopa medication or consult neurologist promptly to clarify recommendation Self-esteem/depression -- Use Burns book ad lib.  Reframe work functioning as noted and Systems analyst. Social support -- Training and development officer out suitable church.  Option engage a Parkinson's support group. Health care behavior -- Assess further whether it is safe enough to operate in the house without a standing/walking support device.  May warrant a cane or PT/OT home evaluation to prevent further falls. Maintain medication as prescribed and work  faithfully with relevant prescriber(s) if any changes are desired or seem indicated Call the clinic on-call service, present to ER, or call 911 if any life-threatening psychiatric crisis Return q 2 wks as able, for recommend sched ahead.  Robley Fries, PhD  Marliss Czar, PhD LP Clinical Psychologist, [facility] [address] [place], [place] [address] [phone]

## 2023-02-22 NOTE — Progress Notes (Unsigned)
Assessment/Plan:   1.  Parkinsons Disease  -He is on pramipexole, but currently only taking it bid.  Discussed with him the last several visits that this isn't really a bid drug.   Discussed that there is a once per day ER version but insurance likely won't pay for that.  After some discussion again today, he thinks that he can manage taking it 3 times per day and attempting to get in the middle of the day dose while at work.  He is to take pramipexole 0.5 mg, 2 tablets in the morning, 1 in the afternoon, 1 about 5:00 in the evening.    -Patient and I have discussed levodopa.  He understands that I believe that levodopa did not cause the depression, but rather stopping the Abilify at the same time that he previously started levodopa was what actually caused his depression symptoms.  We discussed this previously as well as today.  He did tolerate Rytary, it was just costly.  Ultimately, we decided to go ahead and restart levodopa given degree of rigidity today.  He will take carbidopa/levodopa 25/100 and slowly work to 1 tablet at 7 AM/11 AM/4 PM.  Titration schedule was given.  2.  Depression             -Continue wellbutrin XL, 300 mg daily.   3.  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   Subjective:   Douglas Price was seen today in follow up for Parkinsons disease.  My previous records were reviewed prior to todays visit as well as outside records available to me.  I have not seen the patient since October.  Discussed again last visit the importance of taking his pramipexole 3 times per day.  We restarted levodopa as well.  He previously thought it caused depression, but he had stopped Abilify at the same time he started levodopa and we discussed that it was the stopping of the Abilify that had caused the depression.  He did call me in April and had apparently gone off of the Wellbutrin and wanted to restart it.  I told  him he could not restart it at the same dose, so we restarted it at 150 mg daily for 1 month and then I told him he could increase it to 300 mg daily thereafter.  He then called me May 6 stating that he had somehow stopped both his levodopa and pramipexole for 1 month.  We offered him a video visit 2 days later to discuss how to restart medicine, because we could not just restart it quickly, but he did not take that and is now here to discuss it.  Current prescribed movement disorder medications: Pramipexole 0.5 mg, 2/1/1  Carbidopa/levodopa 25/100, 1 tablet 3 times per day at 7 AM/11 AM/4 PM. Wellbutrin XL, 300 mg daily (restarted in April)  PREVIOUS MEDICATIONS: PREVIOUS MEDICATIONS: amantadine (no help for tremor); ropinirole - 1 mg bid (on for restless leg); rytary -some help but too expensive.; carbidopa/levodopa 25/100; Lexapro (made him jittery and reports as allergy)  ALLERGIES:   Allergies  Allergen Reactions   Avelox [Moxifloxacin] Other (See Comments)    Angioedema Fatigue   Quinolones Other (See Comments)    Angioedema   Lexapro [Escitalopram] Other (See Comments)    unknown    CURRENT MEDICATIONS:  Current  Meds  Medication Sig   buPROPion (WELLBUTRIN XL) 300 MG 24 hr tablet TAKE 1 TABLET(300 MG) BY MOUTH DAILY AS NEEDED   DULoxetine (CYMBALTA) 60 MG capsule Take 120 mg by mouth daily.    dutasteride (AVODART) 0.5 MG capsule Take 0.5 mg by mouth daily.   HYDROcodone-acetaminophen (NORCO/VICODIN) 5-325 MG tablet 1-2 tabs PO q6 hours prn pain   loratadine (CLARITIN) 10 MG tablet Take 10 mg by mouth daily.   metFORMIN (GLUCOPHAGE) 500 MG tablet Take 500 mg by mouth daily with breakfast.   mirtazapine (REMERON) 15 MG tablet Take 15 mg by mouth at bedtime.   nebivolol (BYSTOLIC) 5 MG tablet TK 1 T PO D   omeprazole (PRILOSEC) 20 MG capsule Take 20 mg by mouth 2 (two) times daily before a meal.    pramipexole (MIRAPEX) 0.5 MG tablet 2 in the AM, 1 at noon, 1 at 5pm    tamsulosin (FLOMAX) 0.4 MG CAPS capsule Take 0.4 mg by mouth daily.     Objective:   PHYSICAL EXAMINATION:    VITALS:   Vitals:   06/26/22 1536  BP: 110/68  Pulse: 81  SpO2: 97%  Weight: 232 lb (105.2 kg)  Height: 6\' 3"  (1.905 m)     GEN:  The patient appears stated age and is in NAD. HEENT:  Normocephalic, atraumatic.  The mucous membranes are moist. The superficial temporal arteries are without ropiness or tenderness. CV:  RRR Lungs:  CTAB Neck/HEME:  There are no carotid bruits bilaterally.  Neurological examination:  Orientation: The patient is alert and oriented x3. Cranial nerves: There is good facial symmetry with mild facial hypomimia. The speech is fluent and clear. Soft palate rises symmetrically and there is no tongue deviation. Hearing is intact to conversational tone. Sensation: Sensation is intact to light touch throughout Motor: Strength is at least antigravity x4.  Movement examination: Tone: There is mod to severe increased tone in the RUE (hasn't taken med since 6:30 am and seen at 4pm).   Abnormal movements: there is RUE rest tremor that increases with distraction.   Coordination:  There is mild to mod decremation with RAM's, R>L Gait and Station: The patient is flexed at waist, with some festination with the walker.    I have reviewed and interpreted the following labs independently    Chemistry      Component Value Date/Time   NA 138 04/06/2022 1515   K 5.2 (H) 04/06/2022 1515   CL 101 04/06/2022 1515   CO2 25 04/06/2022 1515   BUN 19 04/06/2022 1515   CREATININE 0.97 04/06/2022 1515      Component Value Date/Time   CALCIUM 9.7 04/06/2022 1515   ALKPHOS 44 08/09/2010 0220   AST 45 (H) 08/09/2010 0220   ALT 54 (H) 08/09/2010 0220   BILITOT 0.9 08/09/2010 0220       Lab Results  Component Value Date   WBC 8.6 03/07/2016   HGB 13.3 03/07/2016   HCT 40.5 03/07/2016   MCV 89.8 03/07/2016   PLT 244 03/07/2016    Lab Results   Component Value Date   TSH 0.712 05/24/2010     Total time spent on today's visit was 35 minutes, including both face-to-face time and nonface-to-face time.  Time included that spent on review of records (prior notes available to me/labs/imaging if pertinent), discussing treatment and goals, answering patient's questions and coordinating care.  Cc:  Chilton Greathouse, MD

## 2023-02-26 ENCOUNTER — Encounter: Payer: Self-pay | Admitting: Neurology

## 2023-02-26 ENCOUNTER — Ambulatory Visit (INDEPENDENT_AMBULATORY_CARE_PROVIDER_SITE_OTHER): Payer: BC Managed Care – PPO | Admitting: Neurology

## 2023-02-26 ENCOUNTER — Telehealth: Payer: Self-pay | Admitting: Neurology

## 2023-02-26 VITALS — BP 118/60 | HR 76 | Ht 75.0 in | Wt 210.4 lb

## 2023-02-26 DIAGNOSIS — G20A1 Parkinson's disease without dyskinesia, without mention of fluctuations: Secondary | ICD-10-CM | POA: Diagnosis not present

## 2023-02-26 DIAGNOSIS — F331 Major depressive disorder, recurrent, moderate: Secondary | ICD-10-CM

## 2023-02-26 NOTE — Telephone Encounter (Signed)
Pt needs  letter saying that it is ok for him to do the exercise program at the [facility] that Dr [name] was talking about with him today. He would like that letter mailed to him if she will do it

## 2023-02-27 NOTE — Telephone Encounter (Signed)
Letter is addressed and mailed. Called patient left message that the letter is being mailed

## 2023-03-12 ENCOUNTER — Encounter: Payer: Self-pay | Admitting: Physician Assistant

## 2023-03-12 ENCOUNTER — Ambulatory Visit (INDEPENDENT_AMBULATORY_CARE_PROVIDER_SITE_OTHER): Payer: BC Managed Care – PPO | Admitting: Physician Assistant

## 2023-03-12 DIAGNOSIS — G20A1 Parkinson's disease without dyskinesia, without mention of fluctuations: Secondary | ICD-10-CM

## 2023-03-12 DIAGNOSIS — F411 Generalized anxiety disorder: Secondary | ICD-10-CM | POA: Diagnosis not present

## 2023-03-12 DIAGNOSIS — F331 Major depressive disorder, recurrent, moderate: Secondary | ICD-10-CM

## 2023-03-12 DIAGNOSIS — Z87898 Personal history of other specified conditions: Secondary | ICD-10-CM | POA: Diagnosis not present

## 2023-03-12 DIAGNOSIS — G479 Sleep disorder, unspecified: Secondary | ICD-10-CM

## 2023-03-12 MED ORDER — BUPROPION HCL ER (XL) 150 MG PO TB24: ORAL_TABLET | ORAL | 1 refills | Status: DC

## 2023-03-12 NOTE — Progress Notes (Signed)
[facility]  Patient ID: Douglas Price,  MRN: [id number]  PCP: Chilton Greathouse, MD  Date of Evaluation: 03/12/2023 Time spent: 28 mins  Chief Complaint:  Chief Complaint   Anxiety; Depression; Insomnia; Follow-up    HISTORY/CURRENT STATUS: HPI  For routine med check.  Is still having some depression, but part of it is b/c school is out. He really enjoys his job, misses being around the kids. 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 Has low energy, not as motivated to do things. Some of that may be d/t not having AC at home. He has several floor fans, which helps some. Appetite is good and weight is stable. ADLs are at his baseline. Limited d/t Parkinsons but he gets by. A sister and brother help out with food and things that are necessary. Personal hygiene is nl. No SI/HI.   Does gets overwhelmed and still has times of nervousness in general.  Klonopin helps. Not having PA. Also take Temazepam for sleep. If he doesn't he can neither fall sleep or stay asleep.  Review of Systems  Constitutional:  Positive for malaise/fatigue.  HENT: Negative.    Eyes: Negative.   Respiratory: Negative.    Cardiovascular: Negative.   Gastrointestinal: Negative.   Genitourinary: Negative.   Musculoskeletal: Negative.   Skin: Negative.   Neurological:        See HPI  Endo/Heme/Allergies: Negative.   Psychiatric/Behavioral:         See HPI   Individual Medical History/ Review of Systems: Changes? :Yes  Saw Dr. Arbutus Leas recently for the Parkinsons. Her note reviewed. Having a levodopa challenge test, on 04/12/23  Past medications for mental health diagnoses include: Cymbalta, Wellbutrin, Abilify, Klonopin, Xanax, Temazipam  Allergies: Avelox [moxifloxacin], Quinolones, and Lexapro [escitalopram]  Current Medications:  Current Outpatient Medications:    buPROPion (WELLBUTRIN XL) 300 MG 24 hr tablet, Take one tablet 300 mg daily, Disp: 90  tablet, Rfl: 1   clonazePAM (KLONOPIN) 0.5 MG tablet, Take 0.5 mg by mouth daily., Disp: , Rfl:    DULoxetine (CYMBALTA) 60 MG capsule, Take 120 mg by mouth daily. , Disp: , Rfl:    dutasteride (AVODART) 0.5 MG capsule, Take 0.5 mg by mouth daily., Disp: , Rfl:    HYDROcodone-acetaminophen (NORCO/VICODIN) 5-325 MG tablet, , Disp: , Rfl:    HYDROcodone-acetaminophen (NORCO/VICODIN) 5-325 MG tablet, 1-2 tabs PO q6 hours prn pain, Disp: 15 tablet, Rfl: 0   metFORMIN (GLUCOPHAGE) 500 MG tablet, Take 500 mg by mouth daily with breakfast., Disp: , Rfl:    mirtazapine (REMERON) 15 MG tablet, Take 15 mg by mouth at bedtime., Disp: , Rfl:    nebivolol (BYSTOLIC) 5 MG tablet, TK 1 T PO D, Disp: , Rfl:    omeprazole (PRILOSEC) 20 MG capsule, Take 20 mg by mouth 2 (two) times daily before a meal. , Disp: , Rfl:    pregabalin (LYRICA) 75 MG capsule, Take 75 mg by mouth 2 (two) times daily., Disp: , Rfl:    tamsulosin (FLOMAX) 0.4 MG CAPS capsule, Take 0.4 mg by mouth daily., Disp: , Rfl:    temazepam (RESTORIL) 15 MG capsule, Take 1 capsule (15 mg total) by mouth at bedtime as needed., Disp: 30 capsule, Rfl: 1   buPROPion (WELLBUTRIN XL) 150 MG 24 hr tablet, Until you run out of the 300 mg, take 1 of those plus 1 of this 150 mg=450 total. When you run out of the 300  mg pills, start taking 3 of these 150 mg pills=450 mg per day., Disp: 90 tablet, Rfl: 1   carbidopa-levodopa (SINEMET IR) 25-100 MG tablet, Take 1 tablet by mouth 3 (three) times daily. 7am/11am/4pm (Patient not taking: Reported on 02/07/2023), Disp: 270 tablet, Rfl: 1   loratadine (CLARITIN) 10 MG tablet, Take 10 mg by mouth daily. (Patient not taking: Reported on 03/12/2023), Disp: , Rfl:    pramipexole (MIRAPEX) 0.5 MG tablet, 2 in the AM, 1 at noon, 1 at 5pm (Patient not taking: Reported on 02/07/2023), Disp: 450 tablet, Rfl: 1 Medication Side Effects: none  Family Medical/ Social History: Changes? No  MENTAL HEALTH EXAM:  There were no vitals  taken for this visit.There is no height or weight on file to calculate BMI.  General Appearance: Casual and Well Groomed  Eye Contact:  Good  Speech:  Clear and Coherent and Normal Rate  Volume:  Normal  Mood:  Depressed  Affect:  Depressed  Thought Process:  Goal Directed and Descriptions of Associations: Circumstantial  Orientation:  Full (Time, Place, and Person)  Thought Content: Logical   Suicidal Thoughts:  No  Homicidal Thoughts:  No  Memory:  WNL  Judgement:  Good  Insight:  Good  Psychomotor Activity:   walks with rolling walker, stoops forward, shuffling gate. Difficulty rising from seated position but able to use arms of chair to get up without assistance. RUE pill rolling tremor at rest and of tremor of forearm  Concentration:  Concentration: Good  Recall:  Good  Fund of Knowledge: Good  Language: Good  Assets:  Communication Skills Desire for Improvement Housing Resilience Transportation  ADL's:  Impaired  Cognition: WNL  Prognosis:  Good   DIAGNOSES:    ICD-10-CM   1. Major depressive disorder, recurrent episode, moderate (HCC)  F33.1     2. Generalized anxiety disorder  F41.1     3. Sleep disturbance  G47.9     4. Parkinson's disease without dyskinesia or fluctuating manifestations  G20.A1     5. History of benzodiazepine dependence  Z87.898      Receiving Psychotherapy: Yes  with Dr. Marliss Czar  RECOMMENDATIONS:  PDMP reviewed.  Temazepam given 03/09/2023.  Klonopin filled 02/20/2023. I provided 28 minutes of face to face time during this encounter, including time spent before and after the visit in records review, medical decision making, counseling pertinent to today's visit, and charting.   Discussed the depression. Again reviewed the fact that th depression likely worsened after stopping the Abilify d/t Parkinsons and starting Sinemet at that time, not the Sinemet causing the depression. I recommend increasing the Wellbutrin to max. He agrees.    Disc the fact that he's on both Temazepam and Clonazepam. I'd prefer he be on one or the other, or none at all if we can get the anx/sleep controlled in another way. They're both BZ, can cause confusion,  imbalance, and falls. Definitely high risk b/c Parkinsons. He's been on them both for awhile. At this time, I'll leave the same since we're changing Wellbutrin and I only want to make one change at a time. He understands and is will to change later as is appropriate.   I'm glad he's seeing Dr. Arbutus Leas again, and will have levodopa challenge test soon. Again encouraged him to follow her advice, including meds and exercise. Both will help delay debility assoc w/ Parkinsons.   I'll discuss the lack of [facility] w/ Dr. Farrel Demark to see if he knows any resources to  help pt get [facility], as he's unable to afford getting his fixed.  Will let pt know after discussing with him.   Increase Wellbutrin XL to 450 mg daily. Continue Klonopin 0.5 mg, 1 every day prn. Continue Cymbalta 60 mg, 2 po every day.  Continue Mirtazapine 15 mg, 1 at bedtime. Continue Lyrica 75 mg, 1 po bid. Continue Temazepam 15 mg, at bedtime.  Continue therapy with Dr. Marliss Czar. Return in 5-6 weeks.  [name], PA-C

## 2023-03-13 ENCOUNTER — Ambulatory Visit: Payer: BC Managed Care – PPO | Admitting: Neurology

## 2023-03-21 ENCOUNTER — Ambulatory Visit (INDEPENDENT_AMBULATORY_CARE_PROVIDER_SITE_OTHER): Payer: BC Managed Care – PPO | Admitting: Psychiatry

## 2023-03-21 ENCOUNTER — Ambulatory Visit: Payer: Medicare Other | Admitting: Psychiatry

## 2023-03-21 DIAGNOSIS — F331 Major depressive disorder, recurrent, moderate: Secondary | ICD-10-CM

## 2023-03-21 DIAGNOSIS — G20A1 Parkinson's disease without dyskinesia, without mention of fluctuations: Secondary | ICD-10-CM

## 2023-03-21 DIAGNOSIS — F411 Generalized anxiety disorder: Secondary | ICD-10-CM | POA: Diagnosis not present

## 2023-03-21 DIAGNOSIS — Z87898 Personal history of other specified conditions: Secondary | ICD-10-CM

## 2023-03-21 DIAGNOSIS — M48062 Spinal stenosis, lumbar region with neurogenic claudication: Secondary | ICD-10-CM

## 2023-03-21 NOTE — Progress Notes (Signed)
Psychotherapy Progress Note [facility] Marliss Czar, PhD LP  Patient ID: Douglas Price [facility] "Douglas Price")    MRN: [id number] Therapy format: Individual psychotherapy Date: 03/21/2023      Start: 4:18p     Stop: 5:04p     Time Spent: 46 min Location: In-person   Session narrative (presenting needs, interim history, self-report of stressors and symptoms, applications of prior therapy, status changes, and interventions made in session) Not so afflicted with that feeling that his superiors might want him out.  Able to settle better with knowing his work is valued and valuable.  Bright and of year review from principal.  Now into the summer months, with more time on his hands and having wisely distributed his paycheck rather than taking 10 month scale during the year.  Big stress right now is his A/C unit is broken, and apparently has been ailing for a couple of years already.  In recent, sustained 90+ temps, indoor air can reach 86 degrees upstairs, 82 downstairs.  Begins to claim that his siblings are walling off the conversation asking for financial assistance, says brother and sister avoid the subject, and Douglas Price tends to imagine thing they are thinking about him.  Fact checked his perceptions and state of the conversation so far, and it has not clear that he has gotten around to clearly stating his need, may tend toward labeling his hardship and passively hoping they will connect the dots and offer help rather than openly asking.  Initially sounds like he considers himself strapped and simply unable to afford the work himself, but it is more nuanced than that.  Looking into his finances, he has had some stifling debt before but consolidated and will pay off in November.  Otherwise says he is living paycheck to paycheck, but acknowledges that he could put $1000 toward what is about a $4000 repair, and would hope that his brother and sister could each chip in $1500.  Contrary to  impressions, brother has actually offered to cover property tax (est. $1800), so suggestions made to ask if his brother would redirect that gift toward the more urgent need of A/C repair, work out partial subsidies and what ever amount siblings can agree to, and decide for himself how much to cover out of pocket and how much he might finance.  Encouraged to see how this kind of practical problem solving would help put to lie to any ideas that he is emotional and irresponsible in dealing with this.  For communication purposes, and rapport with his siblings, he does have some past experience of sister casting aspersions on his money management, so framed an assertive inquiry approach if he does come to feel doubted silently or otherwise, basically being willing to ask if he is coming across like he is whining, panicking, or being dishonest.  Encouraged that, just by taking the lead speaking the uncomfortable subject, he will put his audience that he is and put the lie to the idea that he is trying to do those things.  Agrees, will try.  Queried whether his substance rehab years ago might color his sister's view of him, does not think so, believes she does know the difference between irresponsible substance abuse and having to reckon with unintended physiological dependency.  Given the [place] therapy environment here, he raises another question about how to see hardships in his life, whether they are part of the sank defecation process and in some measure God testing him.  Without proselytizing,  compared perspectives and typical positions various Christians will take, settling on a constructive, grace based view that God does not necessarily Chartered loss adjuster or select hardships we face, e.g. Parkinson's, but wholeheartedly means to be available and empathetic with [place] as we go through them, and in the process help [place] learn better when to surrender her own efforts to control things and irrational blame, as well as be part of  the compassionate response for each other.  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  Briefly checked on Parkinson's care, states he still has not been taking levodopa, contrary to the agreement made at first session.  Reports that the neurologist wants him to be off of it, actually, at this point, in advance of an on/off test coming in a couple of weeks.  Challenged as to whether she meant to be off of it the day of testing but still try it ahead of time, but deferred to his own understanding, just refresh the idea that the medication is supposed to work like the parachute, i.e., if you have already lost the plane, you still want something to slow your descent.  Fact checked via [facility] staff message after session, and validated that neurologist is okay with him being off medication until then.    Therapeutic modalities: Cognitive Behavioral Therapy, Solution-Oriented/Positive Psychology, Ego-Supportive, Faith-sensitive, and Assertiveness/Communication  Mental Status/Observations:  Appearance:   Casual     Behavior:  Appropriate  Motor:  Rollator, tremors per Parkinson's .  Difficulty rising from the chair and momentary difficulty starting to walk.  Speech/Language:   Clear and Coherent and minor dysfluency  Affect:  Some blunting  Mood:  anxious and dysthymic  Thought process:  normal  Thought content:    worry  Sensory/Perceptual disturbances:    WNL  Orientation:  Fully oriented  Attention:  Good    Concentration:  Good  Memory:  WNL  Insight:    Good  Judgment:   Good  Impulse Control:  Good   Risk Assessment: Danger to Self: No Self-injurious Behavior: No Danger to Others: No Physical Aggression / Violence: No Duty to Warn: No Access to Firearms a concern: No  Assessment of progress:  progressing  Diagnosis:   ICD-10-CM   1. Major  depressive disorder, recurrent episode, moderate (HCC)  F33.1     2. Generalized anxiety disorder  F41.1     3. Parkinson's disease without dyskinesia or fluctuating manifestations  G20.A1     4. 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 History of benzodiazepine dependence  Z87.898      Plan:  Parkinson's care -- Unless clearly contraindicated by neurology, recommend be on prescribed dopa agent sooner rather than later, but defer to direct communication between patient and specialist.  Ongoing encouragement to check assumptions with all doctors. HVAC crisis -- Approach siblings again as desired for financial assistance but be clearer that he is willing and able to pay part of the cost and to be transparent about money management if they need to verify his need.  Also the practical idea to redirect assistance already pledged for property tax to the [facility] unit, which is much more urgent.  Encourage thinking through how and how much of the cost he might finance himself, as a measure of good faith when asking charity.  Use strategies  discussed to verify and dispel verbalized or presumed concerns about his honesty and financial competency, if they exist, including letting them know it's OK to ask questions and OK to express doubts if they have them.  Worst case, if no assistance agreed, approach power company or social services about possible resident assistance programs rather than assume he is out of luck altogether. Worry over his image as a worker -- Self remind as needed evidence that he is accepted (loved by kids, highly reviewed by superiors) and that he adds value, by virtue of being both disabled and white in addition to his fundamental kindness, among disadvantaged minority kids with hardships and disabilities of their own. Other recommendations/advice -- As may be noted above.  Continue to utilize previously learned skills ad lib. Medication compliance --  Maintain medication as prescribed and work faithfully with relevant prescriber(s) if any changes are desired or seem indicated. Crisis service -- Aware of call list and work-in appts.  Call the clinic on-call service, 988/hotline, 911, or present to [facility] or ER if any life-threatening psychiatric crisis. Followup -- Return for time as already scheduled, avail earlier @ PT's need.  Next scheduled visit with me 04/18/2023.  Next scheduled in this office 04/15/2023.  Robley Fries, PhD Marliss Czar, PhD LP Clinical Psychologist, [facility] [address] [place], [place] [address] [phone]

## 2023-03-25 ENCOUNTER — Other Ambulatory Visit: Payer: Self-pay | Admitting: Orthopedic Surgery

## 2023-03-25 DIAGNOSIS — M6740 Ganglion, unspecified site: Secondary | ICD-10-CM | POA: Diagnosis not present

## 2023-03-25 DIAGNOSIS — M19041 Primary osteoarthritis, right hand: Secondary | ICD-10-CM | POA: Diagnosis not present

## 2023-03-25 DIAGNOSIS — M19042 Primary osteoarthritis, left hand: Secondary | ICD-10-CM | POA: Diagnosis not present

## 2023-03-25 DIAGNOSIS — M65342 Trigger finger, left ring finger: Secondary | ICD-10-CM | POA: Diagnosis not present

## 2023-04-04 DIAGNOSIS — Z85820 Personal history of malignant melanoma of skin: Secondary | ICD-10-CM | POA: Diagnosis not present

## 2023-04-04 DIAGNOSIS — L821 Other seborrheic keratosis: Secondary | ICD-10-CM | POA: Diagnosis not present

## 2023-04-04 DIAGNOSIS — D225 Melanocytic nevi of trunk: Secondary | ICD-10-CM | POA: Diagnosis not present

## 2023-04-04 DIAGNOSIS — L298 Other pruritus: Secondary | ICD-10-CM | POA: Diagnosis not present

## 2023-04-04 DIAGNOSIS — L720 Epidermal cyst: Secondary | ICD-10-CM | POA: Diagnosis not present

## 2023-04-04 DIAGNOSIS — L814 Other melanin hyperpigmentation: Secondary | ICD-10-CM | POA: Diagnosis not present

## 2023-04-04 DIAGNOSIS — D2261 Melanocytic nevi of right upper limb, including shoulder: Secondary | ICD-10-CM | POA: Diagnosis not present

## 2023-04-04 DIAGNOSIS — Z85828 Personal history of other malignant neoplasm of skin: Secondary | ICD-10-CM | POA: Diagnosis not present

## 2023-04-04 DIAGNOSIS — D2262 Melanocytic nevi of left upper limb, including shoulder: Secondary | ICD-10-CM | POA: Diagnosis not present

## 2023-04-08 ENCOUNTER — Other Ambulatory Visit: Payer: Self-pay | Admitting: Physician Assistant

## 2023-04-10 NOTE — Progress Notes (Signed)
Assessment/Plan:   1.  Parkinsonism  -He has stopped all his Parkinson's medicine  -levodopa challenge did not demonstrate benefit to levodopa at 250 mg  -we will do skin biopsy for alpha syn  -CBGD is on ddx given significant one sided rigidity but he doesn't have much memory impairment.  MSA-C could also be in the ddx but he does have a lot of rigidity.  The biopsy should help [place] different an alpha syn state from a tauopathy state    2.  Depression     -on wellbutrin but he thinks not helping  -following with [facility], [name], [place].  He asked me again about treatment for depression and I told him he needs to follow-up with [facility], but I did tell him I would like to avoid the antipsychotic class of medications.   3.  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   Subjective:   Douglas Price was seen today in follow up for levodopa challenge.  He is currently off of pramipexole and levodopa.    Current prescribed movement disorder medications: Pramipexole 0.5 mg, 2/1/1 (not taking) Carbidopa/levodopa 25/100, 1 tablet 3 times per day at 7 AM/11 AM/4 PM (not taking) Wellbutrin XL, 300 mg daily (restarted in April)  PREVIOUS MEDICATIONS: PREVIOUS MEDICATIONS: amantadine (no help for tremor); ropinirole - 1 mg bid (on for restless leg); rytary -some help but too expensive.; carbidopa/levodopa 25/100; Lexapro (made him jittery and reports as allergy)  ALLERGIES:   Allergies  Allergen Reactions   Avelox [Moxifloxacin] Other (See Comments)    Angioedema Fatigue   Quinolones Other (See Comments)    Angioedema   Lexapro [Escitalopram] Other (See Comments)    unknown    CURRENT MEDICATIONS:  Current Meds  Medication Sig   buPROPion (WELLBUTRIN XL) 150 MG 24 hr tablet Until you run out of the 300 mg, take 1 of those plus 1 of this 150 mg=450 total. When you run out of the 300 mg pills,  start taking 3 of these 150 mg pills=450 mg per day.   buPROPion (WELLBUTRIN XL) 300 MG 24 hr tablet Take one tablet 300 mg daily   clonazePAM (KLONOPIN) 0.5 MG tablet Take 0.5 mg by mouth daily.   DULoxetine (CYMBALTA) 60 MG capsule Take 120 mg by mouth daily.    dutasteride (AVODART) 0.5 MG capsule Take 0.5 mg by mouth daily.   HYDROcodone-acetaminophen (NORCO/VICODIN) 5-325 MG tablet    HYDROcodone-acetaminophen (NORCO/VICODIN) 5-325 MG tablet 1-2 tabs PO q6 hours prn pain   loratadine (CLARITIN) 10 MG tablet Take 10 mg by mouth daily.   metFORMIN (GLUCOPHAGE) 500 MG tablet Take 500 mg by mouth daily with breakfast.   mirtazapine (REMERON) 15 MG tablet Take 15 mg by mouth at bedtime.   nebivolol (BYSTOLIC) 5 MG tablet TK 1 T PO D   omeprazole (PRILOSEC) 20 MG capsule Take 20 mg by mouth 2 (two) times daily before a meal.    pramipexole (MIRAPEX) 0.5 MG tablet 2 in the AM, 1 at noon, 1 at 5pm   pregabalin (LYRICA) 75 MG capsule Take 75 mg by mouth 2 (two) times daily.   tamsulosin (FLOMAX) 0.4 MG CAPS capsule Take 0.4 mg by mouth daily.   temazepam (RESTORIL) 15 MG capsule TAKE 1 CAPSULE(15 MG) BY MOUTH AT BEDTIME AS NEEDED  Objective:   PHYSICAL EXAMINATION:    VITALS:   Vitals:   04/12/23 1301  BP: 112/74  Pulse: 89  SpO2: 96%  Weight: 210 lb 6.4 oz (95.4 kg)  Height: 6\' 3"  (1.905 m)     Neurological examination:   Levodopa challenge done today.  UPDRS motor off score was 33.  Pt then given 250mg  of levodopa dissolved in ginger ale and waited 40 minutes to re-examine him.  UPDRS motor on score was 30 .  Details of UPDRS motor score documented on separate neurophysiologic worksheet.     Movement examination: Tone: There is mod to severe increased tone in the RUE and LE.  This is demonstrated both before and after levodopa. Abnormal movements: there is RUE rest tremor that increases with distraction.  This is true both before and after levodopa. Coordination:  There is  mild to mod decremation with RAM's, R>L.  He has some orthopedic issues in the right hand that make hand opening and closing impossible Gait and Station: The patient is unable to arise without the use of his hands, both before and after levodopa.  He pushes off.  He is unable to stand without the use of his walker, and nearly falls when tried.  He is given the walker and walks fairly well in the hall.  When we try to have him let go, he nearly falls.  We are not even able to attempt a pull test, because he nearly falls spontaneously.  I have reviewed and interpreted the following labs independently    Chemistry      Component Value Date/Time   NA 138 04/06/2022 1515   K 5.2 (H) 04/06/2022 1515   CL 101 04/06/2022 1515   CO2 25 04/06/2022 1515   BUN 19 04/06/2022 1515   CREATININE 0.97 04/06/2022 1515      Component Value Date/Time   CALCIUM 9.7 04/06/2022 1515   ALKPHOS 44 08/09/2010 0220   AST 45 (H) 08/09/2010 0220   ALT 54 (H) 08/09/2010 0220   BILITOT 0.9 08/09/2010 0220       Lab Results  Component Value Date   WBC 8.6 03/07/2016   HGB 13.3 03/07/2016   HCT 40.5 03/07/2016   MCV 89.8 03/07/2016   PLT 244 03/07/2016    Lab Results  Component Value Date   TSH 0.712 05/24/2010     Total time spent on today's visit was 60 minutes, including both face-to-face time and nonface-to-face time.  Time included that spent on review of records (prior notes available to me/labs/imaging if pertinent), discussing treatment and goals, answering patient's questions and coordinating care.  This did not include the wait time for levodopa to kick in  Cc:  [name], [name], MD

## 2023-04-12 ENCOUNTER — Encounter: Payer: Self-pay | Admitting: Neurology

## 2023-04-12 ENCOUNTER — Encounter: Payer: BC Managed Care – PPO | Admitting: Neurology

## 2023-04-12 ENCOUNTER — Ambulatory Visit (INDEPENDENT_AMBULATORY_CARE_PROVIDER_SITE_OTHER): Payer: BC Managed Care – PPO | Admitting: Neurology

## 2023-04-12 ENCOUNTER — Ambulatory Visit: Payer: BC Managed Care – PPO | Admitting: Neurology

## 2023-04-12 VITALS — BP 112/74 | HR 89 | Ht 75.0 in | Wt 210.4 lb

## 2023-04-12 DIAGNOSIS — G20C Parkinsonism, unspecified: Secondary | ICD-10-CM

## 2023-04-15 ENCOUNTER — Ambulatory Visit: Payer: Medicare Other | Admitting: Physician Assistant

## 2023-04-16 ENCOUNTER — Ambulatory Visit (INDEPENDENT_AMBULATORY_CARE_PROVIDER_SITE_OTHER): Payer: BC Managed Care – PPO | Admitting: Physician Assistant

## 2023-04-16 ENCOUNTER — Ambulatory Visit: Payer: Medicare Other | Admitting: Physician Assistant

## 2023-04-16 ENCOUNTER — Encounter: Payer: Self-pay | Admitting: Physician Assistant

## 2023-04-16 DIAGNOSIS — F331 Major depressive disorder, recurrent, moderate: Secondary | ICD-10-CM

## 2023-04-16 DIAGNOSIS — F411 Generalized anxiety disorder: Secondary | ICD-10-CM | POA: Diagnosis not present

## 2023-04-16 DIAGNOSIS — G20C Parkinsonism, unspecified: Secondary | ICD-10-CM

## 2023-04-16 MED ORDER — BUPROPION HCL ER (XL) 150 MG PO TB24: ORAL_TABLET | ORAL | Status: DC

## 2023-04-16 NOTE — Progress Notes (Signed)
[facility]  Patient ID: Douglas Price,  MRN: [id number]  PCP: Chilton Greathouse, MD  Date of Evaluation: 04/16/2023 Time spent:30 minutes  Chief Complaint:  Chief Complaint   Depression    HISTORY/CURRENT STATUS: HPI  For routine med check.  Approximately 5 weeks ago we increased the Wellbutrin to 450 mg daily.  He thinks it may be helping a little.  He is still "down" at times but thinks a lot of it may be due to his circumstances.  Still does not have air conditioning in his home so that has been difficult.  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  Looking forward to going back to work next month.  He is still limited with activities due to Parkinsonism.  He uses a Programmer, systems.  Energy and motivation are fair.  ADLs and personal hygiene are normal for him.  Appetite is normal and weight is stable.  He does not cry easily.  No feelings of hopelessness.  Focus and attention are good.  He sleeps okay, not as good as he would like because it is so hot and without a see that makes it hard to sleep.  He is on temazepam to help with sleep.  He also takes Klonopin for anxiety.  Not having panic attacks but does still get overwhelmed easily.  No suicidal or homicidal thoughts.  Patient denies increased energy with decreased need for sleep, increased talkativeness, racing thoughts, impulsivity or risky behaviors, increased spending, increased libido, grandiosity, increased irritability or anger, paranoia, or hallucinations.  Review of Systems  Constitutional:  Positive for malaise/fatigue.  HENT: Negative.    Eyes: Negative.   Respiratory: Negative.    Cardiovascular: Negative.   Gastrointestinal: Negative.   Genitourinary: Negative.   Musculoskeletal:        Left ring finger, trigger finger, surgery scheduled for next month  Skin: Negative.   Neurological:        See HPI  Endo/Heme/Allergies: Negative.    Psychiatric/Behavioral:         See HPI   Individual Medical History/ Review of Systems: Changes? :Yes  Saw Dr. Arbutus Leas last week, had dopamine challenge, did not demonstrate benefit to levodopa.  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   2 teeth (roots)  extracted, oral surgery done since LOV  Aug 8th, will have surgery for trigger finger  Past medications for mental health diagnoses include: Cymbalta, Wellbutrin, Abilify, Klonopin, Xanax, Temazapam  Allergies: Avelox [moxifloxacin], Quinolones, and Lexapro [escitalopram]  Current Medications:  Current Outpatient Medications:    buPROPion (WELLBUTRIN XL) 300 MG 24 hr tablet, Take one tablet 300 mg daily, Disp: 90 tablet, Rfl: 1   clonazePAM (KLONOPIN) 0.5 MG tablet, Take 0.5 mg by mouth daily., Disp: , Rfl:    DULoxetine (CYMBALTA) 60 MG capsule, Take 120 mg by mouth daily. , Disp: , Rfl:    dutasteride (AVODART) 0.5 MG capsule, Take 0.5 mg by mouth daily., Disp: , Rfl:    HYDROcodone-acetaminophen (NORCO/VICODIN) 5-325 MG tablet, , Disp: , Rfl:    HYDROcodone-acetaminophen (NORCO/VICODIN) 5-325 MG tablet, 1-2 tabs PO q6 hours prn pain, Disp: 15 tablet, Rfl: 0   loratadine (CLARITIN) 10 MG tablet, Take 10 mg by mouth daily., Disp: , Rfl:    metFORMIN (GLUCOPHAGE) 500 MG tablet, Take 500 mg by mouth daily with breakfast., Disp: , Rfl:    mirtazapine (REMERON) 15 MG tablet, Take 15  mg by mouth at bedtime., Disp: , Rfl:    nebivolol (BYSTOLIC) 5 MG tablet, TK 1 T PO D, Disp: , Rfl:    omeprazole (PRILOSEC) 20 MG capsule, Take 20 mg by mouth 2 (two) times daily before a meal. , Disp: , Rfl:    pregabalin (LYRICA) 75 MG capsule, Take 75 mg by mouth 2 (two) times daily., Disp: , Rfl:    tamsulosin (FLOMAX) 0.4 MG CAPS capsule, Take 0.4 mg by mouth daily., Disp: , Rfl:    temazepam (RESTORIL) 15 MG capsule, TAKE 1 CAPSULE(15 MG) BY MOUTH AT BEDTIME AS NEEDED, Disp: 30 capsule, Rfl: 0   buPROPion (WELLBUTRIN XL) 150 MG 24 hr tablet, Until you run  out of the 300 mg, take 1 of those plus 1 of this 150 mg=450 total. When you run out of the 300 mg pills, start taking 3 of these 150 mg pills=450 mg per day., Disp: , Rfl:    carbidopa-levodopa (SINEMET IR) 25-100 MG tablet, Take 1 tablet by mouth 3 (three) times daily. 7am/11am/4pm (Patient not taking: Reported on 02/07/2023), Disp: 270 tablet, Rfl: 1   pramipexole (MIRAPEX) 0.5 MG tablet, 2 in the AM, 1 at noon, 1 at 5pm (Patient not taking: Reported on 04/16/2023), Disp: 450 tablet, Rfl: 1 Medication Side Effects: none  Family Medical/ Social History: Changes? No  MENTAL HEALTH EXAM:  There were no vitals taken for this visit.There is no height or weight on file to calculate BMI.  General Appearance: Casual and Well Groomed  Eye Contact:  Good  Speech:  Clear and Coherent and Normal Rate  Volume:  Normal  Mood:   Sad  Affect:  Congruent  Thought Process:  Goal Directed and Descriptions of Associations: Circumstantial  Orientation:  Full (Time, Place, and Person)  Thought Content: Logical   Suicidal Thoughts:  No  Homicidal Thoughts:  No  Memory:  WNL  Judgement:  Good  Insight:  Good  Psychomotor Activity:   walks with rolling walker, stoops forward, shuffling gate. Difficulty rising from seated position but able to use arms of chair to get up without assistance. RUE pill rolling tremor at rest and of tremor of forearm  Concentration:  Concentration: Good  Recall:  Good  Fund of Knowledge: Good  Language: Good  Assets:  Communication Skills Desire for Improvement Housing Resilience Transportation  ADL's:  Impaired  Cognition: WNL  Prognosis:  Good   DIAGNOSES:    ICD-10-CM   1. Major depressive disorder, recurrent episode, moderate (HCC)  F33.1     2. Generalized anxiety disorder  F41.1     3. Parkinsonism, unspecified Parkinsonism type  G20.C      Receiving Psychotherapy: Yes  with Dr. Marliss Czar  RECOMMENDATIONS:  PDMP reviewed.  Temazepam given 04/08/2023.   Klonopin filled 03/21/2023. I provided 30 minutes of face to face time during this encounter, including time spent before and after the visit in records review, medical decision making, counseling pertinent to today's visit, and charting.   We had a long discussion concerning his symptoms.  To our knowledge he has never been on an SSRI, it may be beneficial to change Cymbalta to one.  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  Those symptoms could worsen if we stopped the Cymbalta.  He verbalizes understanding.  1 option would be to change to Pristiq, that may help the depression more, even though it is in the same class of drugs.  He may get benefit for the neuropathy with that as well.  He has been on Abilify in the past but that is not an option at this time. Per Dr. Don Perking note on 04/12/2023. "I did tell him I would like to avoid the antipsychotic class of medications."  Bill and I discussed the reasoning behind this.  Another consideration would be changing Wellbutrin to [employer].  At this point we agreed to make no changes until he has the skin biopsy and also the surgery for trigger finger next month.  He will call in the meantime if the depression symptoms worsen before our next appointment.   Discussed the fact that he is on 2 benzodiazepines.  At this time I think they are medically necessary but once the neurologic diagnosis is pinned down and a treatment plan is formulated, I will likely try to get him off Klonopin or temazepam depending on his need at the time.  He understands the fall risk and confusion that can occur due to these drugs and accepts those risks.  Contract for safety in place. Call the office on-call service, 988/hotline, 911, or present to [facility] or ER if any life-threatening psychiatric crisis. Patient verbalizes understanding.   Continue Wellbutrin XL 450 mg daily. Continue Klonopin 0.5 mg, 1 every day prn. Continue  Cymbalta 60 mg, 2 po every day.  Continue Mirtazapine 15 mg, 1 at bedtime. Continue Lyrica 75 mg, 1 po bid. Continue Temazepam 15 mg, at bedtime.  Continue therapy with Dr. Marliss Czar. Return in 4 weeks.  [name], PA-C

## 2023-04-18 ENCOUNTER — Telehealth: Payer: Self-pay | Admitting: Neurology

## 2023-04-18 ENCOUNTER — Ambulatory Visit (INDEPENDENT_AMBULATORY_CARE_PROVIDER_SITE_OTHER): Payer: BC Managed Care – PPO | Admitting: Psychiatry

## 2023-04-18 DIAGNOSIS — F331 Major depressive disorder, recurrent, moderate: Secondary | ICD-10-CM | POA: Diagnosis not present

## 2023-04-18 DIAGNOSIS — G20A1 Parkinson's disease without dyskinesia, without mention of fluctuations: Secondary | ICD-10-CM | POA: Diagnosis not present

## 2023-04-18 DIAGNOSIS — Z9189 Other specified personal risk factors, not elsewhere classified: Secondary | ICD-10-CM

## 2023-04-18 DIAGNOSIS — M48062 Spinal stenosis, lumbar region with neurogenic claudication: Secondary | ICD-10-CM

## 2023-04-18 DIAGNOSIS — F4010 Social phobia, unspecified: Secondary | ICD-10-CM | POA: Diagnosis not present

## 2023-04-18 NOTE — Telephone Encounter (Signed)
Called patient and left voicemail to return call. ?

## 2023-04-18 NOTE — Progress Notes (Signed)
Psychotherapy Progress Note [facility] Marliss Czar, PhD LP  Patient ID: Douglas Price "Douglas Price")    MRN: [id number] Therapy format: Individual psychotherapy Date: 04/18/2023      Start: 4:03p     Stop: 4:51p     Time Spent: 48 min Location: In-person   Session narrative (presenting needs, interim history, self-report of stressors and symptoms, applications of prior therapy, status changes, and interventions made in session) Trigger finger surgery coming up (left ring finger, plus a cyst).  Figures recovery time will delay his return to school.    Did have the conversation with brother about underwriting his A/C repair, and Douglas Price, but the work is not done yet.  Neighbor came to his aid, meanwhile, setting up a portable unit for him, to establish a cool room.  Best estimate $7K, after thinking it would be $9K, and Douglas Price alleges Douglas Price is still not willing to fund that far.  Refreshed assertiveness ideas and the importance of balancing respect with being clear about his actual situation and the value of climate control for his medical condition.  Reexamined whether he would prefer to incur debt somehow than advocate for his needs.  Did the neuro test with Douglas Price on and off dopa medication, and -- surprisingly -- there were no differences in performance, vindicating his assertion that the medication made no difference, only expense.  Now set up for a biopsy next week for differential dx, but he cancelled that, too, for thinking it would just be useless information.  Discussed his thought process, challenging whether he knew what he thinks he knows about the value of biopsy relative to cost ($381, he says), and encouraged to ask for clarification what the actionable value is.  Tentatively agreed to do that, while Tx used [facility] chat to [facility] Douglas Price, who stated both that her assistant can call him tomorrow, but he was already explained extensively at his  very recent visit.  Douglas Price asserts no memory for it, raising concern for memory vs. repression.  Supportively challenged him to hold off any cynical assumptions about just racking up billable procedures just to tell him again he has Parkinson's; better to think further about what prognosis would actually mean, e.g., what to expect in symptoms, what to plan for in self-care, not just life expectancy or the fact of having a disease.  Illustrated the value of getting a better look by comparing to "eddying out" in [employer] and planning for Medicaid activation in the event of a nursing home.  Persuaded it could well be worth knowing more how to manage and brace for his condition, especially at the price tag he has.  General but tentative agreement.  A couple days now on a dose increase of Wellbutrin.  Rx'd alongside Cymbalta, which could be pulled back if too anxious.  Says he doesn't see any change, but encouraged to give it some time.  As for managing depression, emphasized assertively addressing his depressing conditions, so he gets the picture of somebody (himself) working for him.  Incl. furthering the conversation about funding A/C, being willing to ask siblings if they doubt his word (not just presume), and inquire with neuro if it doesn't make sense yet why to spend $381 on a "prognosis".  Therapeutic modalities: Cognitive Behavioral Therapy, Solution-Oriented/Positive Psychology, Ego-Supportive, and Motivational Interviewing  Mental Status/Observations:  Appearance:   Casual     Behavior:  Appropriate  Motor:  Rollator  Speech/Language:  Clear and Coherent  Affect:  Appropriate  Mood:  dysthymic  Thought process:  normal  Thought content:    Morose interpretations  Sensory/Perceptual disturbances:    WNL  Orientation:  Fully oriented  Attention:  Good    Concentration:  Good  Memory:  grossly intact  Insight:    Fair  Judgment:   Fair  Impulse Control:  Good   Risk  Assessment: Danger to Self: No Self-injurious Behavior: No Danger to Others: No Physical Aggression / Violence: No Duty to Warn: No Access to Firearms a concern: No  Assessment of progress:  stabilized  Diagnosis:   ICD-10-CM   1. Major depressive disorder, recurrent episode, moderate (HCC)  F33.1     2. Social anxiety disorder  F40.10     3. Parkinson's disease without dyskinesia or fluctuating manifestations  G20.A1     4. 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 Lack of air conditioning in home environment  Z91.89      Plan:  Parkinson's and other health care -- Re-calendar biopsy or engage and pay attention to what the value of biopsy is to treatment and life planning.  Ongoing encouragement to check assumptions with all doctors, don't settle for cynicism.  Follow through with other needed procedures Financial concerns and family relations -- Approach siblings again as desired for financial assistance but be clearer that he is willing and able to pay part of the cost, transparent about money management if they need to verify his need, and clear enough about why it matters for his health.  Practically, can divert assistance already pledged for property tax to [facility].  Encourage thinking through how much of the cost he might finance himself, as a measure of good faith, when begging charity.  Use strategies discussed to verify and dispel verbalized or presumed concerns about his honesty and financial competency, if they exist, including letting them know it's OK to ask questions and OK to express doubts if they have them.  Worst case, if no assistance agreed, approach power company or social services about possible resident assistance programs rather than assume he is out of luck altogether.  Notice and dispute idealistic thinking that they should figure things out and offer charity, notice and dispute unnecessarily cynical thinking about not caring, practice  being clear about requests and willing to ask if he is seen as irresponsible somehow. Worry over his image as a worker -- Self remind as needed evidence that he is accepted (loved by kids, highly reviewed by superiors) and that he adds value, by virtue of being both disabled and white in addition to his fundamental kindness, among disadvantaged minority kids with hardships and disabilities of their own. Other recommendations/advice -- As may be noted above.  Continue to utilize previously learned skills ad lib. Medication compliance -- Maintain medication as prescribed and work faithfully with relevant prescriber(s) if any changes are desired or seem indicated. Crisis service -- Aware of call list and work-in appts.  Call the clinic on-call service, 988/hotline, 911, or present to [facility] or ER if any life-threatening psychiatric crisis. Followup -- Return for time as already scheduled.  Next scheduled visit with me 05/16/2023.  Next scheduled in this office 05/16/2023.  Douglas Fries, PhD Marliss Czar, PhD LP Clinical Psychologist, [facility] [address], [address] [place], [place] [address] [phone]

## 2023-04-18 NOTE — Telephone Encounter (Signed)
Pt is calling wanting to speak with Dr. Arbutus Leas or Leeroy Bock about a test that he was suppose to be having and he is not too sure if he would like to do it at this present time due to the cost of it.  Pt would like to have a call back.

## 2023-04-19 ENCOUNTER — Telehealth: Payer: Self-pay | Admitting: Neurology

## 2023-04-19 NOTE — Telephone Encounter (Signed)
Called patient and left voicemail.

## 2023-04-19 NOTE — Telephone Encounter (Signed)
Patient called to return missed phone call /KB

## 2023-04-23 NOTE — Telephone Encounter (Signed)
Patient having hand surgery next week and he feels it is too much for him to do the biopsy and hand surgery. I have answered patients questions to the best of my abilities and he understood testing but would like to hold off until after his surgery. I will call him mid August to see if he is ready for the biopsy

## 2023-04-24 ENCOUNTER — Encounter (HOSPITAL_BASED_OUTPATIENT_CLINIC_OR_DEPARTMENT_OTHER): Payer: Self-pay | Admitting: Orthopedic Surgery

## 2023-04-24 ENCOUNTER — Other Ambulatory Visit: Payer: Self-pay

## 2023-04-25 ENCOUNTER — Other Ambulatory Visit: Payer: Self-pay | Admitting: Orthopedic Surgery

## 2023-04-26 ENCOUNTER — Ambulatory Visit: Payer: BC Managed Care – PPO | Admitting: Neurology

## 2023-05-01 ENCOUNTER — Ambulatory Visit (HOSPITAL_COMMUNITY)
Admission: EM | Admit: 2023-05-01 | Discharge: 2023-05-01 | Disposition: A | Discharge status: Home / Self Care | Payer: BC Managed Care – PPO | Attending: Nurse Practitioner | Admitting: Nurse Practitioner

## 2023-05-01 ENCOUNTER — Encounter (HOSPITAL_BASED_OUTPATIENT_CLINIC_OR_DEPARTMENT_OTHER)
Admission: RE | Admit: 2023-05-01 | Discharge: 2023-05-01 | Disposition: A | Discharge status: Home / Self Care | Payer: BC Managed Care – PPO | Source: Ambulatory Visit | Attending: Orthopedic Surgery | Admitting: Orthopedic Surgery

## 2023-05-01 ENCOUNTER — Encounter (HOSPITAL_COMMUNITY): Payer: Self-pay

## 2023-05-01 DIAGNOSIS — S81801A Unspecified open wound, right lower leg, initial encounter: Secondary | ICD-10-CM | POA: Diagnosis not present

## 2023-05-01 DIAGNOSIS — Z01810 Encounter for preprocedural cardiovascular examination: Secondary | ICD-10-CM | POA: Diagnosis not present

## 2023-05-01 DIAGNOSIS — S81802A Unspecified open wound, left lower leg, initial encounter: Secondary | ICD-10-CM | POA: Diagnosis not present

## 2023-05-01 DIAGNOSIS — M65342 Trigger finger, left ring finger: Secondary | ICD-10-CM | POA: Diagnosis not present

## 2023-05-01 DIAGNOSIS — M19042 Primary osteoarthritis, left hand: Secondary | ICD-10-CM | POA: Diagnosis not present

## 2023-05-01 DIAGNOSIS — F17290 Nicotine dependence, other tobacco product, uncomplicated: Secondary | ICD-10-CM | POA: Diagnosis not present

## 2023-05-01 DIAGNOSIS — M71342 Other bursal cyst, left hand: Secondary | ICD-10-CM | POA: Diagnosis not present

## 2023-05-01 MED ORDER — BACITRACIN ZINC 500 UNIT/GM EX OINT
TOPICAL_OINTMENT | CUTANEOUS | Status: AC
  Filled 2023-05-01: qty 1.8

## 2023-05-01 MED ORDER — MUPIROCIN 2 % EX OINT: 1.0000 | TOPICAL_OINTMENT | Freq: Two times a day (BID) | CUTANEOUS | 0 refills | Status: AC

## 2023-05-01 NOTE — ED Triage Notes (Signed)
Patient reports that he is suppose to have finger surgery tomorrow and staff noted that he had open areas on bilateral lower extremities and redness. Patient stated, "They are heat blisters"  Patient states he ha not been using any medications for his symptoms.

## 2023-05-01 NOTE — Progress Notes (Signed)

## 2023-05-01 NOTE — Progress Notes (Signed)
While patient was here for EKG redness and small open sores on both legs were noted. Patient states is diabetic and has neuropathy. Patient denies pain or discomfort. Patient states this issue is not being treated since he just noticed it yesterday. Recommended patient to see primary care physician or go to urgent care to be treated. Patient states will go to urgent care. Left message for Steward Drone at Dr. Merrilee Seashore office.

## 2023-05-01 NOTE — ED Provider Notes (Signed)
[facility]    CSN: [id number] Arrival date & time: 05/01/23  1225      History   Chief Complaint No chief complaint on file.   HPI Douglas Price is a 72 y.o. male.   Patient presents today for "sores" on both lower extremities.  Reports he first noticed them today at his pre-op appointment.  He has a planned surgery tomorrow for trigger finger of the left fourth digit and mucoid cyst removal.  He reports the areas are not tender and do not itch or burn.  There is no been any drainage from the areas.  Reports he thought there were heat blisters, whenever he pushes on the blister, starts draining and becomes a wound.  Denies any recent trauma to the lower extremities, although has numbness of the lower extremities at baseline secondary to neuropathy.  Also has Parkinson syndrome.  No fevers or nausea/vomiting.  No body aches or chills.  Reports he has type 2 diabetes for which he takes metformin.  Does not check blood sugar secondary to involuntary upper extremity movement.    Past Medical History:  Diagnosis Date   Allergic rhinitis due to pollen    Benign neoplasm of colon    Blisters with epidermal loss due to burn (second degree) of foot    Complication of anesthesia    pt woke up during saphenous vein surgery-had epidural   Constipation due to pain medication    Degeneration of lumbar or lumbosacral intervertebral disc    Diverticulosis of colon (without mention of hemorrhage)    DM (diabetes mellitus) (HCC)    Esophageal reflux    Hypertrophy of prostate with urinary obstruction and other lower urinary tract symptoms (LUTS)    Loss of weight    Neuromuscular disorder (HCC)    peripheral neuropathy   Obesity, unspecified    Pain in limb    Restless legs syndrome (RLS)    Sleep disturbance 03/24/2014   Spinal stenosis, unspecified region other than cervical    Tobacco use disorder    Unspecified disease of pericardium    Unspecified essential hypertension     Unspecified local infection of skin and subcutaneous tissue    Varices of other sites     Patient Active Problem List   Diagnosis Date Noted   Pain due to onychomycosis of toenails of both feet 01/31/2023   Parkinson's disease 11/25/2017   Diabetic polyneuropathy associated with type 2 diabetes mellitus (HCC) 11/25/2017   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   S/P lumbar spinal fusion 03/14/2016   S/P lumbar laminectomy 09/08/2015   Sleep disturbance 03/24/2014    Past Surgical History:  Procedure Laterality Date   BACK SURGERY     CATARACT EXTRACTION Bilateral    one in july and other in Aug.2023   COLONOSCOPY W/ POLYPECTOMY     CYST EXCISION Left 04/09/2022   Procedure: LEFT INDEX FINGER MUCOID CYST EXCISION;  Surgeon: Betha Loa, MD;  Location: [facility];  Service: Orthopedics;  Laterality: Left;  Bier block   LUMBAR LAMINECTOMY/DECOMPRESSION MICRODISCECTOMY Bilateral 09/08/2015   Procedure: Laminectomy and Foraminotomy - Lumbar two-lumbar three bilateral;  Surgeon: Tia Alert, MD;  Location: [facility];  Service: Neurosurgery;  Laterality: Bilateral;   SAPHENOUS VEIN GRAFT RESECTION Right 09/24/1989   TENDON EXPLORATION Left 04/09/2022   Procedure: DEBRIDEMENT DISTAL INTERPHALANGEAL JOINT LEFT INDEX FINGER;  Surgeon: Betha Loa, MD;  Location: [facility];  Service: Orthopedics;  Laterality: Left;  Bier block   TONSILLECTOMY         Home Medications    Prior to Admission medications   Medication Sig Start Date End Date Taking? Authorizing Provider  mupirocin ointment (BACTROBAN) 2 % Apply 1 Application topically 2 (two) times daily for 5 days. 05/01/23 05/06/23 Yes Valentino Nose, NP  buPROPion (WELLBUTRIN XL) 150 MG 24 hr tablet Until you run out of the 300 mg, take 1 of those plus 1 of this 150 mg=450 total. When you run out of the 300 mg pills, start taking 3 of these 150 mg pills=450 mg per  day. 04/16/23   Cherie Ouch, PA-C  buPROPion (WELLBUTRIN XL) 300 MG 24 hr tablet Take one tablet 300 mg daily 02/05/23   [name], Octaviano Batty, DO  clonazePAM (KLONOPIN) 0.5 MG tablet Take 0.5 mg by mouth daily. 01/21/23   [provider]  DULoxetine (CYMBALTA) 60 MG capsule Take 120 mg by mouth daily.     [provider]  dutasteride (AVODART) 0.5 MG capsule Take 0.5 mg by mouth daily.    [provider]  HYDROcodone-acetaminophen (NORCO/VICODIN) 5-325 MG tablet  12/24/11   [provider]  HYDROcodone-acetaminophen (NORCO/VICODIN) 5-325 MG tablet 1-2 tabs PO q6 hours prn pain 04/09/22   Betha Loa, MD  loratadine (CLARITIN) 10 MG tablet Take 10 mg by mouth daily.    [provider]  metFORMIN (GLUCOPHAGE) 500 MG tablet Take 500 mg by mouth daily with breakfast.    [provider]  mirtazapine (REMERON) 15 MG tablet Take 15 mg by mouth at bedtime.    [provider]  nebivolol (BYSTOLIC) 5 MG tablet TK 1 T PO D 11/04/17   [provider]  omeprazole (PRILOSEC) 20 MG capsule Take 20 mg by mouth 2 (two) times daily before a meal.     [provider]  pregabalin (LYRICA) 75 MG capsule Take 75 mg by mouth 2 (two) times daily.    [provider]  tamsulosin (FLOMAX) 0.4 MG CAPS capsule Take 0.4 mg by mouth daily.    [provider]  temazepam (RESTORIL) 15 MG capsule TAKE 1 CAPSULE(15 MG) BY MOUTH AT BEDTIME AS NEEDED 04/10/23   Cherie Ouch, PA-C    Family History Family History  Problem Relation Age of Onset   Heart failure Mother    Lung cancer Father    Neuropathy Brother    Hypertension Paternal Grandfather    Tuberculosis Maternal Uncle    Cancer - Colon Maternal Uncle     Social History Social History   Tobacco Use   Smoking status: Some Days    Types: Pipe   Smokeless tobacco: Never   Tobacco comments:    moderate, 1-2bowls a day or more  Vaping Use   Vaping status: Never Used   Substance Use Topics   Alcohol use: No    Alcohol/week: 0.0 standard drinks of alcohol   Drug use: No     Allergies   Avelox [moxifloxacin], Quinolones, and Lexapro [escitalopram]   Review of Systems Review of Systems Per HPI  Physical Exam Triage Vital Signs ED Triage Vitals  Encounter Vitals Group     BP 05/01/23 1316 109/67     Systolic BP Percentile --      Diastolic BP Percentile --      Pulse Rate 05/01/23 1316 78     Resp 05/01/23 1316 16     Temp 05/01/23 1316 (!) 97.5 F (36.4  C)     Temp Source 05/01/23 1316 Oral     SpO2 05/01/23 1316 97 %     Weight --      Height --      Head Circumference --      Peak Flow --      Pain Score 05/01/23 1319 0     Pain Loc --      Pain Education --      Exclude from Growth Chart --    No data found.  Updated Vital Signs BP 109/67 (BP Location: Right Arm)   Pulse 78   Temp (!) 97.5 F (36.4 C) (Oral)   Resp 16   SpO2 97%   Visual Acuity Right Eye Distance:   Left Eye Distance:   Bilateral Distance:    Right Eye Near:   Left Eye Near:    Bilateral Near:     Physical Exam Vitals and nursing note reviewed.  Constitutional:      General: He is not in acute distress.    Appearance: Normal appearance. He is not toxic-appearing.  HENT:     Head: Normocephalic and atraumatic.     Mouth/Throat:     Mouth: Mucous membranes are moist.     Pharynx: Oropharynx is clear.  Eyes:     General: No scleral icterus.       Right eye: No discharge.        Left eye: No discharge.     Extraocular Movements: Extraocular movements intact.     Pupils: Pupils are equal, round, and reactive to light.  Cardiovascular:     Rate and Rhythm: Normal rate and regular rhythm.  Pulmonary:     Effort: Pulmonary effort is normal. No respiratory distress.  Skin:    Capillary Refill: Capillary refill takes less than 2 seconds.     Findings: Wound present.     Comments: Multiple open wounds to bilateral lower extremities as pictured  below.  No surrounding erythema, warmth, active drainage.  No odor.  No fluctuance.   Neurological:     Mental Status: He is alert and oriented to person, place, and time.  Psychiatric:        Behavior: Behavior is cooperative.         UC Treatments / Results  Labs (all labs ordered are listed, but only abnormal results are displayed) Labs Reviewed - No data to display  EKG   Radiology No results found.  Procedures Procedures (including critical care time)  Medications Ordered in UC Medications - No data to display  Initial Impression / Assessment and Plan / UC Course  I have reviewed the triage vital signs and the nursing notes.  Pertinent labs & imaging results that were available during my care of the patient were reviewed by me and considered in my medical decision making (see chart for details).   Patient is well-appearing, normotensive, afebrile, not tachycardic, not tachypneic, oxygenating well on room air.    1. Wound of left lower extremity, initial encounter 2. Wound of right lower extremity, initial encounter Case was discussed with Dr. Tracie Harrier in urgent care No signs of cellulitis or red flag symptoms today, will defer antibiotic treatment Wound care discussed with patient,; clean wound twice daily with mild soap and water.  Apply thin layer of mupirocin ointment twice daily cover with nonadherent gauze or left open to air.  Follow-up with wound center.   Referral placed to wound center given history of diabetes and likely poor  wound healing at baseline Discussed with patient that the wounds do not exclude him from having surgery tomorrow Strict ER and return precautions discussed with patient  The patient was given the opportunity to ask questions.  All questions answered to their satisfaction.  The patient is in agreement to this plan.   Final Clinical Impressions(s) / UC Diagnoses   Final diagnoses:  Wound of left lower extremity, initial encounter   Wound of right lower extremity, initial encounter     Discharge Instructions      Please clean the wounds twice daily with mild soap and warm water and apply a thin layer of mupirocin ointment twice daily.  Cover with nonadherent gauze and tape.  We have put in a referral for a wound clinic to monitor the areas and make sure they heal appropriately.  If you develop fever, nausea/vomiting, generally unwell feeling, wound drainage, or wound odor, please seek care.    ED Prescriptions     Medication Sig Dispense Auth. Provider   mupirocin ointment (BACTROBAN) 2 % Apply 1 Application topically 2 (two) times daily for 5 days. 22 g Valentino Nose, NP      PDMP not reviewed this encounter.   Valentino Nose, NP 05/01/23 [phone]

## 2023-05-01 NOTE — Discharge Instructions (Addendum)
Please clean the wounds twice daily with mild soap and warm water and apply a thin layer of mupirocin ointment twice daily.  Cover with nonadherent gauze and tape.  We have put in a referral for a wound clinic to monitor the areas and make sure they heal appropriately.  If you develop fever, nausea/vomiting, generally unwell feeling, wound drainage, or wound odor, please seek care.

## 2023-05-02 ENCOUNTER — Ambulatory Visit: Payer: BC Managed Care – PPO | Admitting: Psychiatry

## 2023-05-02 ENCOUNTER — Ambulatory Visit (HOSPITAL_BASED_OUTPATIENT_CLINIC_OR_DEPARTMENT_OTHER): Payer: BC Managed Care – PPO | Admitting: Anesthesiology

## 2023-05-02 ENCOUNTER — Encounter (HOSPITAL_BASED_OUTPATIENT_CLINIC_OR_DEPARTMENT_OTHER): Payer: Self-pay | Admitting: Orthopedic Surgery

## 2023-05-02 ENCOUNTER — Encounter (HOSPITAL_BASED_OUTPATIENT_CLINIC_OR_DEPARTMENT_OTHER)
Admission: RE | Disposition: A | Discharge status: Home / Self Care | Payer: Self-pay | Source: Home / Self Care | Attending: Orthopedic Surgery

## 2023-05-02 ENCOUNTER — Other Ambulatory Visit: Payer: Self-pay

## 2023-05-02 ENCOUNTER — Ambulatory Visit (HOSPITAL_BASED_OUTPATIENT_CLINIC_OR_DEPARTMENT_OTHER)
Admission: RE | Admit: 2023-05-02 | Discharge: 2023-05-02 | Disposition: A | Discharge status: Home / Self Care | Payer: BC Managed Care – PPO | Attending: Orthopedic Surgery | Admitting: Orthopedic Surgery

## 2023-05-02 DIAGNOSIS — M19042 Primary osteoarthritis, left hand: Secondary | ICD-10-CM | POA: Insufficient documentation

## 2023-05-02 DIAGNOSIS — M65342 Trigger finger, left ring finger: Secondary | ICD-10-CM | POA: Diagnosis not present

## 2023-05-02 DIAGNOSIS — F17290 Nicotine dependence, other tobacco product, uncomplicated: Secondary | ICD-10-CM | POA: Diagnosis not present

## 2023-05-02 DIAGNOSIS — M71342 Other bursal cyst, left hand: Secondary | ICD-10-CM | POA: Insufficient documentation

## 2023-05-02 DIAGNOSIS — M67442 Ganglion, left hand: Secondary | ICD-10-CM | POA: Diagnosis not present

## 2023-05-02 DIAGNOSIS — Z01818 Encounter for other preprocedural examination: Secondary | ICD-10-CM

## 2023-05-02 DIAGNOSIS — Z01810 Encounter for preprocedural cardiovascular examination: Secondary | ICD-10-CM | POA: Insufficient documentation

## 2023-05-02 HISTORY — PX: TRIGGER FINGER RELEASE: SHX641

## 2023-05-02 HISTORY — PX: CYST EXCISION: SHX5701

## 2023-05-02 LAB — GLUCOSE, CAPILLARY
Glucose-Capillary: 130 mg/dL — ABNORMAL HIGH (ref 70–99)
Glucose-Capillary: 119 mg/dL — ABNORMAL HIGH (ref 70–99)

## 2023-05-02 SURGERY — RELEASE, A1 PULLEY, FOR TRIGGER FINGER
Anesthesia: General | Site: Index Finger | Laterality: Left

## 2023-05-02 MED ORDER — DEXAMETHASONE SODIUM PHOSPHATE 10 MG/ML IJ SOLN
INTRAMUSCULAR | Status: AC
  Filled 2023-05-02: qty 1

## 2023-05-02 MED ORDER — EPHEDRINE 5 MG/ML INJ
INTRAVENOUS | Status: AC
  Filled 2023-05-02: qty 5

## 2023-05-02 MED ORDER — CEFAZOLIN SODIUM-DEXTROSE 2-4 GM/100ML-% IV SOLN
INTRAVENOUS | Status: AC
  Filled 2023-05-02: qty 100

## 2023-05-02 MED ORDER — ONDANSETRON HCL 4 MG/2ML IJ SOLN
INTRAMUSCULAR | Status: AC
  Filled 2023-05-02: qty 2

## 2023-05-02 MED ORDER — FENTANYL CITRATE (PF) 100 MCG/2ML IJ SOLN: 25.0000 ug | INTRAMUSCULAR | Status: DC | PRN

## 2023-05-02 MED ORDER — LIDOCAINE 2% (20 MG/ML) 5 ML SYRINGE
INTRAMUSCULAR | Status: DC | PRN
  Administered 2023-05-02: 60 mg via INTRAVENOUS

## 2023-05-02 MED ORDER — EPHEDRINE SULFATE-NACL 50-0.9 MG/10ML-% IV SOSY
PREFILLED_SYRINGE | INTRAVENOUS | Status: DC | PRN
  Administered 2023-05-02: 5 mg via INTRAVENOUS
  Administered 2023-05-02: 10 mg via INTRAVENOUS

## 2023-05-02 MED ORDER — LACTATED RINGERS IV SOLN: INTRAVENOUS | Status: DC

## 2023-05-02 MED ORDER — BUPIVACAINE HCL (PF) 0.25 % IJ SOLN
INTRAMUSCULAR | Status: DC | PRN
  Administered 2023-05-02: 9 mL

## 2023-05-02 MED ORDER — DEXAMETHASONE SODIUM PHOSPHATE 10 MG/ML IJ SOLN
INTRAMUSCULAR | Status: DC | PRN
  Administered 2023-05-02: 5 mg via INTRAVENOUS

## 2023-05-02 MED ORDER — FENTANYL CITRATE (PF) 100 MCG/2ML IJ SOLN
INTRAMUSCULAR | Status: AC
  Filled 2023-05-02: qty 2

## 2023-05-02 MED ORDER — PHENYLEPHRINE 80 MCG/ML (10ML) SYRINGE FOR IV PUSH (FOR BLOOD PRESSURE SUPPORT)
PREFILLED_SYRINGE | INTRAVENOUS | Status: DC | PRN
  Administered 2023-05-02: 80 ug via INTRAVENOUS
  Administered 2023-05-02: 160 ug via INTRAVENOUS
  Administered 2023-05-02: 80 ug via INTRAVENOUS
  Administered 2023-05-02 (×3): 160 ug via INTRAVENOUS

## 2023-05-02 MED ORDER — BUPIVACAINE HCL (PF) 0.25 % IJ SOLN
INTRAMUSCULAR | Status: AC
  Filled 2023-05-02: qty 30

## 2023-05-02 MED ORDER — FENTANYL CITRATE (PF) 250 MCG/5ML IJ SOLN
INTRAMUSCULAR | Status: DC | PRN
  Administered 2023-05-02: 50 ug via INTRAVENOUS

## 2023-05-02 MED ORDER — MIDAZOLAM HCL 2 MG/2ML IJ SOLN
INTRAMUSCULAR | Status: AC
  Filled 2023-05-02: qty 2

## 2023-05-02 MED ORDER — PROPOFOL 10 MG/ML IV BOLUS
INTRAVENOUS | Status: DC | PRN
Start: 2023-05-02 — End: 2023-05-02
  Administered 2023-05-02: 150 mg via INTRAVENOUS

## 2023-05-02 MED ORDER — MIDAZOLAM HCL 5 MG/5ML IJ SOLN
INTRAMUSCULAR | Status: DC | PRN
  Administered 2023-05-02: 1 mg via INTRAVENOUS

## 2023-05-02 MED ORDER — ARTIFICIAL TEARS OPHTHALMIC OINT
TOPICAL_OINTMENT | OPHTHALMIC | Status: AC
  Filled 2023-05-02: qty 3.5

## 2023-05-02 MED ORDER — CEFAZOLIN SODIUM-DEXTROSE 2-4 GM/100ML-% IV SOLN
2.0000 g | INTRAVENOUS | Status: AC
  Administered 2023-05-02: 2 g via INTRAVENOUS

## 2023-05-02 MED ORDER — ACETAMINOPHEN 500 MG PO TABS
1000.0000 mg | ORAL_TABLET | Freq: Once | ORAL | Status: AC
  Administered 2023-05-02: 1000 mg via ORAL

## 2023-05-02 MED ORDER — ACETAMINOPHEN 500 MG PO TABS
ORAL_TABLET | ORAL | Status: AC
  Filled 2023-05-02: qty 2

## 2023-05-02 MED ORDER — ONDANSETRON HCL 4 MG/2ML IJ SOLN
INTRAMUSCULAR | Status: DC | PRN
  Administered 2023-05-02: 4 mg via INTRAVENOUS

## 2023-05-02 MED ORDER — EPHEDRINE SULFATE (PRESSORS) 50 MG/ML IJ SOLN
INTRAMUSCULAR | Status: DC | PRN
Start: 2023-05-02 — End: 2023-05-02
  Administered 2023-05-02: 10 mg via INTRAVENOUS

## 2023-05-02 SURGICAL SUPPLY — 55 items
APL PRP STRL LF DISP 70% ISPRP (MISCELLANEOUS) ×3
APL SKNCLS STERI-STRIP NONHPOA (GAUZE/BANDAGES/DRESSINGS)
BANDAGE GAUZE 1X75IN STRL (MISCELLANEOUS) IMPLANT
BENZOIN TINCTURE PRP APPL 2/3 (GAUZE/BANDAGES/DRESSINGS) IMPLANT
BLADE MINI RND TIP GREEN BEAV (BLADE) IMPLANT
BLADE SURG 15 STRL LF DISP TIS (BLADE) ×8 IMPLANT
BLADE SURG 15 STRL SS (BLADE) ×6
BNDG CMPR 5X2 CHSV 1 LYR STRL (GAUZE/BANDAGES/DRESSINGS) ×3
BNDG CMPR 5X2 KNTD ELC UNQ LF (GAUZE/BANDAGES/DRESSINGS)
BNDG CMPR 5X3 KNIT ELC UNQ LF (GAUZE/BANDAGES/DRESSINGS)
BNDG CMPR 75X11 PLY HI ABS (MISCELLANEOUS)
BNDG CMPR 75X21 PLY HI ABS (MISCELLANEOUS)
BNDG CMPR 9X4 STRL LF SNTH (GAUZE/BANDAGES/DRESSINGS)
BNDG COHESIVE 1X5 TAN STRL LF (GAUZE/BANDAGES/DRESSINGS) IMPLANT
BNDG COHESIVE 2X5 TAN ST LF (GAUZE/BANDAGES/DRESSINGS) ×4 IMPLANT
BNDG ELASTIC 2INX 5YD STR LF (GAUZE/BANDAGES/DRESSINGS) IMPLANT
BNDG ELASTIC 3INX 5YD STR LF (GAUZE/BANDAGES/DRESSINGS) IMPLANT
BNDG ESMARK 4X9 LF (GAUZE/BANDAGES/DRESSINGS) IMPLANT
BNDG GAUZE 1X75IN STRL (MISCELLANEOUS)
BNDG GAUZE DERMACEA FLUFF 4 (GAUZE/BANDAGES/DRESSINGS) IMPLANT
BNDG GZE DERMACEA 4 6PLY (GAUZE/BANDAGES/DRESSINGS)
BNDG PLASTER X FAST 3X3 WHT LF (CAST SUPPLIES) IMPLANT
BNDG PLSTR 9X3 FST ST WHT (CAST SUPPLIES)
CHLORAPREP W/TINT 26 (MISCELLANEOUS) ×4 IMPLANT
CORD BIPOLAR FORCEPS 12FT (ELECTRODE) ×4 IMPLANT
COVER BACK TABLE 60X90IN (DRAPES) ×4 IMPLANT
COVER MAYO STAND STRL (DRAPES) ×4 IMPLANT
CUFF TOURN SGL QUICK 18X4 (TOURNIQUET CUFF) ×4 IMPLANT
DRAPE EXTREMITY T 121X128X90 (DISPOSABLE) ×4 IMPLANT
DRAPE SURG 17X23 STRL (DRAPES) ×4 IMPLANT
GAUZE SPONGE 4X4 12PLY STRL (GAUZE/BANDAGES/DRESSINGS) ×4 IMPLANT
GAUZE STRETCH 2X75IN STRL (MISCELLANEOUS) IMPLANT
GAUZE XEROFORM 1X8 LF (GAUZE/BANDAGES/DRESSINGS) ×4 IMPLANT
GLOVE BIO SURGEON STRL SZ7.5 (GLOVE) ×4 IMPLANT
GLOVE BIOGEL PI IND STRL 8 (GLOVE) ×4 IMPLANT
GOWN STRL REUS W/ TWL LRG LVL3 (GOWN DISPOSABLE) ×4 IMPLANT
GOWN STRL REUS W/TWL LRG LVL3 (GOWN DISPOSABLE) ×3
GOWN STRL REUS W/TWL XL LVL3 (GOWN DISPOSABLE) ×4 IMPLANT
NDL HYPO 25X1 1.5 SAFETY (NEEDLE) ×3 IMPLANT
NEEDLE HYPO 25X1 1.5 SAFETY (NEEDLE) ×3
NS IRRIG 1000ML POUR BTL (IV SOLUTION) ×4 IMPLANT
PACK BASIN DAY SURGERY FS (CUSTOM PROCEDURE TRAY) ×4 IMPLANT
PAD CAST 3X4 CTTN HI CHSV (CAST SUPPLIES) IMPLANT
PAD CAST 4YDX4 CTTN HI CHSV (CAST SUPPLIES) IMPLANT
PADDING CAST ABS COTTON 4X4 ST (CAST SUPPLIES) ×4 IMPLANT
PADDING CAST COTTON 3X4 STRL (CAST SUPPLIES)
PADDING CAST COTTON 4X4 STRL (CAST SUPPLIES)
STOCKINETTE 4X48 STRL (DRAPES) ×4 IMPLANT
STRIP CLOSURE SKIN 1/2X4 (GAUZE/BANDAGES/DRESSINGS) IMPLANT
SUT ETHILON 3 0 PS 1 (SUTURE) IMPLANT
SUT ETHILON 4 0 PS 2 18 (SUTURE) ×4 IMPLANT
SYR BULB EAR ULCER 3OZ GRN STR (SYRINGE) ×4 IMPLANT
SYR CONTROL 10ML LL (SYRINGE) ×4 IMPLANT
TOWEL GREEN STERILE FF (TOWEL DISPOSABLE) ×8 IMPLANT
UNDERPAD 30X36 HEAVY ABSORB (UNDERPADS AND DIAPERS) ×4 IMPLANT

## 2023-05-02 NOTE — Anesthesia Postprocedure Evaluation (Signed)
Anesthesia Post Note  Patient: Douglas Price  Procedure(s) Performed: LEFT RING FINGER TRIGGER RELEASE (Left: Hand) LEFT INDEX FINGER MUCOID CYST EXCISION AND DISTAL INTERPHALANGEAL JOINT DEBRIDEMENT (Left: Index Finger)     Patient location during evaluation: PACU Anesthesia Type: General Level of consciousness: awake and alert Pain management: pain level controlled Vital Signs Assessment: post-procedure vital signs reviewed and stable Respiratory status: spontaneous breathing, nonlabored ventilation, respiratory function stable and patient connected to nasal cannula oxygen Cardiovascular status: blood pressure returned to baseline and stable Postop Assessment: no apparent nausea or vomiting Anesthetic complications: no  No notable events documented.  Last Vitals:  Vitals:   05/02/23 1215 05/02/23 1232  BP: 126/73 97/62  Pulse: 67 69  Resp: (!) 9   Temp:  (!) 36.2 C  SpO2: 96% 94%    Last Pain:  Vitals:   05/02/23 1232  TempSrc:   PainSc: 0-No pain                  L 

## 2023-05-02 NOTE — Discharge Instructions (Addendum)
No tylenol until 3:05 p.m.   [facility] Instructions Hand Surgery  Wound Care: Keep your hand elevated above the level of your heart.  Do not allow it to dangle by your side.  Keep the dressing dry and do not remove it unless your doctor advises you to do so.  He will usually change it at the time of your post-op visit.  Moving your fingers is advised to stimulate circulation but will depend on the site of your surgery.  If you have a splint applied, your doctor will advise you regarding movement.  Activity: Do not drive or operate machinery today.  Rest today and then you may return to your normal activity and work as indicated by your physician.  Diet:  Drink liquids today or eat a light diet.  You may resume a regular diet tomorrow.    General expectations: Pain for two to three days. Fingers may become slightly swollen.  Call your doctor if any of the following occur: Severe pain not relieved by pain medication. Elevated temperature. Dressing soaked with blood. Inability to move fingers. White or bluish color to fingers.   Post Anesthesia Home Care Instructions  Activity: Get plenty of rest for the remainder of the day. A responsible individual must stay with you for 24 hours following the procedure.  For the next 24 hours, DO NOT: -Drive a car -Advertising copywriter -Drink alcoholic beverages -Take any medication unless instructed by your physician -Make any legal decisions or sign important papers.  Meals: Start with liquid foods such as gelatin or soup. Progress to regular foods as tolerated. Avoid greasy, spicy, heavy foods. If nausea and/or vomiting occur, drink only clear liquids until the nausea and/or vomiting subsides. Call your physician if vomiting continues.  Special Instructions/Symptoms: Your throat may feel dry or sore from the anesthesia or the breathing tube placed in your throat during surgery. If this causes discomfort, gargle with warm salt water. The  discomfort should disappear within 24 hours.  If you had a scopolamine patch placed behind your ear for the management of post- operative nausea and/or vomiting:  1. The medication in the patch is effective for 72 hours, after which it should be removed.  Wrap patch in a tissue and discard in the trash. Wash hands thoroughly with soap and water. 2. You may remove the patch earlier than 72 hours if you experience unpleasant side effects which may include dry mouth, dizziness or visual disturbances. 3. Avoid touching the patch. Wash your hands with soap and water after contact with the patch.

## 2023-05-02 NOTE — Progress Notes (Signed)
Dr. Merlyn Lot aware of patient's bilateral lower extremity wounds, seen yesterday at Urgent Care. Ok to proceed with surgery as planned.

## 2023-05-02 NOTE — H&P (Signed)
Douglas Price is an 72 y.o. male.   Chief Complaint: trigger digit, mucoid cyst HPI: 72 yo male with triggering left ring finger and left index mucoid cyst.  Has had previous injection of trigger digit without lasting resolution.  He wishes to have trigger release and excision of mucoid cyst with debridement of dip joint to try to prevent recurrence.  Allergies:  Allergies  Allergen Reactions   Avelox [Moxifloxacin] Other (See Comments)    Angioedema Fatigue   Quinolones Other (See Comments)    Angioedema   Lexapro [Escitalopram] Other (See Comments)    unknown    Past Medical History:  Diagnosis Date   Allergic rhinitis due to pollen    Benign neoplasm of colon    Blisters with epidermal loss due to burn (second degree) of foot    Complication of anesthesia    pt woke up during saphenous vein surgery-had epidural   Constipation due to pain medication    Degeneration of lumbar or lumbosacral intervertebral disc    Diverticulosis of colon (without mention of hemorrhage)    DM (diabetes mellitus) (HCC)    Esophageal reflux    Hypertrophy of prostate with urinary obstruction and other lower urinary tract symptoms (LUTS)    Loss of weight    Neuromuscular disorder (HCC)    peripheral neuropathy   Obesity, unspecified    Pain in limb    Restless legs syndrome (RLS)    Sleep disturbance 03/24/2014   Spinal stenosis, unspecified region other than cervical    Tobacco use disorder    Unspecified disease of pericardium    Unspecified essential hypertension    Unspecified local infection of skin and subcutaneous tissue    Varices of other sites     Past Surgical History:  Procedure Laterality Date   BACK SURGERY     CATARACT EXTRACTION Bilateral    one in july and other in Aug.2023   COLONOSCOPY W/ POLYPECTOMY     CYST EXCISION Left 04/09/2022   Procedure: LEFT INDEX FINGER MUCOID CYST EXCISION;  Surgeon: Betha Loa, MD;  Location: [facility];  Service:  Orthopedics;  Laterality: Left;  Bier block   LUMBAR LAMINECTOMY/DECOMPRESSION MICRODISCECTOMY Bilateral 09/08/2015   Procedure: Laminectomy and Foraminotomy - Lumbar two-lumbar three bilateral;  Surgeon: Tia Alert, MD;  Location: [facility];  Service: Neurosurgery;  Laterality: Bilateral;   SAPHENOUS VEIN GRAFT RESECTION Right 09/24/1989   TENDON EXPLORATION Left 04/09/2022   Procedure: DEBRIDEMENT DISTAL INTERPHALANGEAL JOINT LEFT INDEX FINGER;  Surgeon: Betha Loa, MD;  Location: [facility];  Service: Orthopedics;  Laterality: Left;  Bier block   TONSILLECTOMY      Family History: Family History  Problem Relation Age of Onset   Heart failure Mother    Lung cancer Father    Neuropathy Brother    Hypertension Paternal Grandfather    Tuberculosis Maternal Uncle    Cancer - Colon Maternal Uncle     Social History:   reports that he has been smoking pipe. He has never used smokeless tobacco. He reports that he does not drink alcohol and does not use drugs.  Medications: Medications Prior to Admission  Medication Sig Dispense Refill   buPROPion (WELLBUTRIN XL) 150 MG 24 hr tablet Until you run out of the 300 mg, take 1 of those plus 1 of this 150 mg=450 total. When you run out of the 300 mg pills, start taking 3 of these 150 mg pills=450 mg per day.  buPROPion (WELLBUTRIN XL) 300 MG 24 hr tablet Take one tablet 300 mg daily 90 tablet 1   clonazePAM (KLONOPIN) 0.5 MG tablet Take 0.5 mg by mouth daily.     DULoxetine (CYMBALTA) 60 MG capsule Take 120 mg by mouth daily.      dutasteride (AVODART) 0.5 MG capsule Take 0.5 mg by mouth daily.     HYDROcodone-acetaminophen (NORCO/VICODIN) 5-325 MG tablet      metFORMIN (GLUCOPHAGE) 500 MG tablet Take 500 mg by mouth daily with breakfast.     mirtazapine (REMERON) 15 MG tablet Take 15 mg by mouth at bedtime.     mupirocin ointment (BACTROBAN) 2 % Apply 1 Application topically 2 (two) times daily for 5 days. 22 g 0    nebivolol (BYSTOLIC) 5 MG tablet TK 1 T PO D     omeprazole (PRILOSEC) 20 MG capsule Take 20 mg by mouth 2 (two) times daily before a meal.      pregabalin (LYRICA) 75 MG capsule Take 75 mg by mouth 2 (two) times daily.     tamsulosin (FLOMAX) 0.4 MG CAPS capsule Take 0.4 mg by mouth daily.     temazepam (RESTORIL) 15 MG capsule TAKE 1 CAPSULE(15 MG) BY MOUTH AT BEDTIME AS NEEDED 30 capsule 0   HYDROcodone-acetaminophen (NORCO/VICODIN) 5-325 MG tablet 1-2 tabs PO q6 hours prn pain 15 tablet 0   loratadine (CLARITIN) 10 MG tablet Take 10 mg by mouth daily.      Results for orders placed or performed during the hospital encounter of 05/02/23 (from the past 48 hour(s))  Glucose, capillary     Status: Abnormal   Collection Time: 05/02/23  9:03 AM  Result Value Ref Range   Glucose-Capillary 130 (H) 70 - 99 mg/dL    Comment: Glucose reference range applies only to samples taken after fasting for at least 8 hours.    No results found.    Blood pressure 115/80, pulse 72, temperature (!) 97.3 F (36.3 C), temperature source Temporal, resp. rate 15, height 6\' 3"  (1.905 m), weight 95.5 kg, SpO2 94%.  General appearance: alert, cooperative, and appears stated age Head: Normocephalic, without obvious abnormality, atraumatic Neck: supple, symmetrical, trachea midline Extremities: Intact sensation and capillary refill all digits.  +epl/fpl/io.  No wounds.  Pulses: 2+ and symmetric Skin: Skin color, texture, turgor normal. No rashes or lesions Neurologic: Grossly normal Incision/Wound: none  Assessment/Plan Left ring finger trigger digit and index finger mucoid cyst.  Non operative and operative treatment options have been discussed with the patient and patient wishes to proceed with operative treatment. Risks, benefits, and alternatives of surgery have been discussed and the patient agrees with the plan of care.   Betha Loa 05/02/2023, 9:56 AM

## 2023-05-02 NOTE — Anesthesia Procedure Notes (Signed)
Procedure Name: LMA Insertion Date/Time: 05/02/2023 11:06 AM  Performed by: Demetrio Lapping, CRNAPre-anesthesia Checklist: Patient identified, Emergency Drugs available, Suction available and Patient being monitored Patient Re-evaluated:Patient Re-evaluated prior to induction Oxygen Delivery Method: Circle System Utilized Preoxygenation: Pre-oxygenation with 100% oxygen Induction Type: IV induction Ventilation: Mask ventilation without difficulty LMA: LMA inserted LMA Size: 5.0 Number of attempts: 1 Airway Equipment and Method: Bite block Placement Confirmation: positive ETCO2 Tube secured with: Tape Dental Injury: Teeth and Oropharynx as per pre-operative assessment

## 2023-05-02 NOTE — Anesthesia Preprocedure Evaluation (Addendum)
Anesthesia Evaluation  Patient identified by MRN, date of birth, ID band Patient awake    Reviewed: Allergy & Precautions, NPO status , Patient's Chart, lab work & pertinent test results, reviewed documented beta blocker date and time   Airway Mallampati: II  TM Distance: >3 FB Neck ROM: Full    Dental  (+) Edentulous Lower, Dental Advisory Given   Pulmonary Current Smoker and Patient abstained from smoking.   Pulmonary exam normal breath sounds clear to auscultation       Cardiovascular hypertension, Pt. on home beta blockers and Pt. on medications Normal cardiovascular exam Rhythm:Regular Rate:Normal  TTE 2018 - Vigorous LV systolic function; mild diastolic dysfunction; trace    MR and TR.     Neuro/Psych  Neuromuscular disease (Parkinson's)  negative psych ROS   GI/Hepatic Neg liver ROS,GERD  ,,  Endo/Other  diabetes, Type 2, Oral Hypoglycemic Agents    Renal/GU negative Renal ROS  negative genitourinary   Musculoskeletal  (+) Arthritis ,    Abdominal   Peds  Hematology negative hematology ROS (+)   Anesthesia Other Findings   Reproductive/Obstetrics                             Anesthesia Physical Anesthesia Plan  ASA: 2  Anesthesia Plan: General   Post-op Pain Management: Tylenol PO (pre-op)*   Induction: Intravenous  PONV Risk Score and Plan: 1 and Ondansetron, Dexamethasone and Treatment may vary due to age or medical condition  Airway Management Planned: LMA  Additional Equipment:   Intra-op Plan:   Post-operative Plan: Extubation in OR  Informed Consent: I have reviewed the patients History and Physical, chart, labs and discussed the procedure including the risks, benefits and alternatives for the proposed anesthesia with the patient or authorized representative who has indicated his/her understanding and acceptance.     Dental advisory given  Plan Discussed  with: CRNA  Anesthesia Plan Comments:        Anesthesia Quick Evaluation

## 2023-05-02 NOTE — Op Note (Signed)
05/02/2023 [facility]  Operative Note  PREOPERATIVE DIAGNOSIS: LEFT RING FINGER TRIGGER DIGIT, LEFT INDEX FINGER MUCOID CYST AND DISTAL INTERPHALANGEAL ARTHRITIS  POSTOPERATIVE DIAGNOSIS:  LEFT RING FINGER TRIGGER DIGIT, LEFT INDEX FINGER MUCOID CYST AND DISTAL INTERPHALANGEAL ARTHRITIS  PROCEDURE: Procedure(s): LEFT RING FINGER TRIGGER RELEASE LEFT INDEX FINGER MUCOID CYST EXCISION AND DISTAL INTERPHALANGEAL JOINT DEBRIDEMENT   SURGEON:  Betha Loa, MD  ASSISTANT:  none.  ANESTHESIA:  General.  IV FLUIDS:  Per anesthesia flow sheet.  ESTIMATED BLOOD LOSS:  Minimal.  COMPLICATIONS:  None.  SPECIMENS: Left index finger mucoid cyst to pathology  TOURNIQUET TIME:  Total Tourniquet Time Documented: Upper Arm (laterality) - 23 minutes Total: Upper Arm (laterality) - 23 minutes   DISPOSITION:  Stable to PACU.  LOCATION: [facility]  INDICATIONS: Douglas Price is a 72 y.o. male with triggering of left ring finger and recurrent mucoid cyst left index finger.  He wishes to have trigger release and excision of mucoid cyst with debridement of DIP joint.  Risks, benefits and alternatives of surgery were discussed including the risk of blood loss, infection, damage to nerves, vessels, tendons, ligaments, bone, failure of surgery, need for additional surgery, complications with wound healing, continued pain, continued triggering and need for repeat surgery.  He voiced understanding of these risks and elected to proceed.  OPERATIVE COURSE:  After being identified preoperatively by myself, the patient and I agreed upon the procedure and site of procedure.  The surgical site was marked. Surgical consent had been signed. He was given IV Ancef as preoperative antibiotic prophylaxis. He was transported to the operating room and placed on the operating room table in supine position with the left upper extremity on an arm board. General anesthesia was induced by the  anesthesiologist.  The left upper extremity was prepped and draped in normal sterile orthopedic fashion. A surgical pause was performed between surgeons, anesthesia, and operating room staff, and all were in agreement as to the patient, procedure, and site of procedure.  Tourniquet at the proximal aspect of the extremity was inflated to 250 mmHg after exsanguination of the arm with an Esmarch bandage.  An incision was made at the volar aspect of the MP joint of the ring finger.  This was carried into the subcutaneous tissues by spreading technique.  Bipolar electrocautery was used to obtain hemostasis.  The radial and ulnar digital nerves were protected throughout the case. The flexor sheath was identified.  The A1 pulley was identified and sharply incised.  It was released in its entirety.  The proximal 1-2 mm of the A2 pulley was vented to allow better excursion of the tendons.  The finger was placed through a range of motion and there was noted to be no catching.  The tendons were brought through the wound and any adherences released.  The wound was then copiously irrigated with sterile saline. It was closed with 4-0 nylon in a horizontal mattress fashion.  A hockey-stick shaped incision was then made at the dorsum of the DIP joint of the left next finger following the previous surgical scar.  This was carried into subcutaneous tissues by spreading technique.  There was scar formation.  The cyst was located distally.  It was filled with clear gelatinous fluid.  There was scar deep to this.  The scar and cyst were excised using the [facility] blade.  The recurrent cyst and scar were sent to pathology for examination.  The DIP joint was entered underneath  the extensor tendon.  The synovectomy rongeurs and house curette were used to debride the joint to remove osteophyte at the dorsum of the middle phalanx.  The wound was then copiously irrigated with sterile saline.  Was closed with 4-0 nylon in a horizontal mattress  fashion.  Digital block was performed with core percent plain Marcaine to aid in postoperative analgesia.  A trigger release wound was injected with 0.25% plain Marcaine to aid in postoperative analgesia.  The wounds were dressed with sterile Xeroform, 4x4s, and wrapped lightly with a Coban dressing.  An AlumaFoam splint was placed on the index finger and wrapped lightly with Coban dressing.  Tourniquet was deflated at 23 minutes.  The fingertips were pink with brisk capillary refill after deflation of the tourniquet.  The operative drapes were broken down and the patient was awoken from anesthesia safely.  He was transferred back to the stretcher and taken to the PACU in stable condition.   I will see him back in the office in 1 week for postoperative followup.  He states he already has pain medications at home.    Betha Loa, MD Electronically signed, 05/02/23

## 2023-05-02 NOTE — Transfer of Care (Signed)
Immediate Anesthesia Transfer of Care Note  Patient: Douglas Price  Procedure(s) Performed: LEFT RING FINGER TRIGGER RELEASE (Left: Hand) LEFT INDEX FINGER MUCOID CYST EXCISION AND DISTAL INTERPHALANGEAL JOINT DEBRIDEMENT (Left: Index Finger)  Patient Location: PACU  Anesthesia Type:General  Level of Consciousness: awake and patient cooperative  Airway & Oxygen Therapy: Patient Spontanous Breathing and Patient connected to face mask oxygen  Post-op Assessment: Report given to RN and Post -op Vital signs reviewed and stable  Post vital signs: Reviewed and stable  Last Vitals:  Vitals Value Taken Time  BP 136/76 05/02/23 1154  Temp    Pulse 69 05/02/23 1156  Resp 11 05/02/23 1156  SpO2 100 % 05/02/23 1156  Vitals shown include unfiled device data.  Last Pain:  Vitals:   05/02/23 0900  TempSrc: Temporal  PainSc: 0-No pain         Complications: No notable events documented.

## 2023-05-03 ENCOUNTER — Encounter (HOSPITAL_BASED_OUTPATIENT_CLINIC_OR_DEPARTMENT_OTHER): Payer: Self-pay | Admitting: Orthopedic Surgery

## 2023-05-06 ENCOUNTER — Ambulatory Visit: Payer: Medicare Other | Admitting: Podiatry

## 2023-05-10 DIAGNOSIS — M19041 Primary osteoarthritis, right hand: Secondary | ICD-10-CM | POA: Diagnosis not present

## 2023-05-10 DIAGNOSIS — M6740 Ganglion, unspecified site: Secondary | ICD-10-CM | POA: Diagnosis not present

## 2023-05-10 DIAGNOSIS — M65342 Trigger finger, left ring finger: Secondary | ICD-10-CM | POA: Diagnosis not present

## 2023-05-15 NOTE — Progress Notes (Incomplete)
Psychotherapy Progress Note [facility] Marliss Czar, PhD LP  Patient ID: Douglas Price [facility] "Douglas Price")    MRN: [id number] Therapy format: Individual psychotherapy Date: 04/18/2023      Start: 4:03p     Stop: 4:51p     Time Spent: 48 min Location: In-person   Session narrative (presenting needs, interim history, self-report of stressors and symptoms, applications of prior therapy, status changes, and interventions made in session) Trigger finger surgery coming up (left ring finger, plus a cyst).  Figures recovery time will delay his return to school.    Did have the conversation with brother about underwriting his A/C repair, and [name] did some comparison shopping, but the work is not done yet.  Neighbor came to his aid, meanwhile, setting up a portable unit for him, to establish a cool room.  Best estimate $7K, after thinking it would be $9K, and Bill alleges [name] is still not willing to fund that far.  Refreshed assertiveness ideas and the importance of balancing respect with being clear about his actual situation and the value of climate control for his medical condition.  Reexamined whether he would prefer to incur debt somehow than advocate for his needs.  Did the neuro test with Dr. Arbutus Leas on and off dopa medication, and -- surprisingly -- there were no differences in performance, vindicating his assertion that the medication made no difference, only expense.  Now set up for a biopsy next week for differential dx, but he cancelled that, too, for thinking it would just be useless information.  Discussed his thought process, challenging whether he knew what he thinks he knows about the value of biopsy relative to cost ($381, he says), and encouraged to ask for clarification what the actionable value is.  Tentatively agreed to do that, while Tx used [facility] chat to [facility] Dr. Arbutus Leas, who stated both that her assistant can call him tomorrow, but he was already explained extensively at his  very recent visit.  Douglas Price asserts no memory for it, raising concern for memory vs. repression.  Supportively challenged him to hold off any cynical assumptions about just racking up billable procedures just to tell him again he has Parkinson's; better to think further about what prognosis would actually mean, e.g., what to expect in symptoms, what to plan for in self-care, not just life expectancy or the fact of having a disease.  Illustrated the value of getting a better look by comparing to "eddying out" in [employer] and planning for Medicaid activation in the event of a nursing home.  Persuaded it could well be worth knowing more how to manage and brace for his condition, especially at the price tag he has.  General but tentative agreement.  A couple days now on a dose increase of Wellbutrin.  Rx'd alongside Cymbalta, which could be pulled back if too anxious.  Says he doesn't see any change, but encouraged to give it some time.  As for managing depression, emphasized assertively addressing his depressing conditions, so he gets the picture of somebody (himself) working for him.  Incl. furthering the conversation about funding A/C, being willing to ask siblings if they doubt his word (not just presume), and inquire with neuro if it doesn't make sense yet why to spend $381 on a "prognosis".  Therapeutic modalities: Cognitive Behavioral Therapy, Solution-Oriented/Positive Psychology, Ego-Supportive, and Motivational Interviewing  Mental Status/Observations:  Appearance:   Casual     Behavior:  Appropriate  Motor:  Rollator  Speech/Language:  Clear and Coherent  Affect:  Appropriate  Mood:  dysthymic  Thought process:  normal  Thought content:    Morose interpretations  Sensory/Perceptual disturbances:    WNL  Orientation:  Fully oriented  Attention:  Good    Concentration:  Good  Memory:  grossly intact  Insight:    Fair  Judgment:   Fair  Impulse Control:  Good   Risk  Assessment: Danger to Self: No Self-injurious Behavior: No Danger to Others: No Physical Aggression / Violence: No Duty to Warn: No Access to Firearms a concern: No  Assessment of progress:  stabilized  Diagnosis:   ICD-10-CM   1. Major depressive disorder, recurrent episode, moderate (HCC)  F33.1     2. Social anxiety disorder  F40.10     3. Parkinson's disease without dyskinesia or fluctuating manifestations  G20.A1     4. 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 Lack of air conditioning in home environment  Z91.89      Plan:  Parkinson's care -- Re-calendar biopsy or engage and pay attention to what the value of biopsy is to treatment and life planning.  Ongoing encouragement to check assumptions with all doctors, don't settle for cynicism. HVAC crisis -- Approach siblings again as desired for financial assistance but be clearer that he is willing and able to pay part of the cost, transparent about money management if they need to verify his need, and clear enough about why it matters for his health.  Practically, can divert assistance already pledged for property tax to [facility].  Encourage thinking through how much of the cost he might finance himself, as a measure of good faith, when begging charity.  Use strategies discussed to verify and dispel verbalized or presumed concerns about his honesty and financial competency, if they exist, including letting them know it's OK to ask questions and OK to express doubts if they have them.  Worst case, if no assistance agreed, approach power company or social services about possible resident assistance programs rather than assume he is out of luck altogether. Worry over his image as a worker -- Self remind as needed evidence that he is accepted (loved by kids, highly reviewed by superiors) and that he adds value, by virtue of being both disabled and white in addition to his fundamental kindness, among disadvantaged  minority kids with hardships and disabilities of their own. Other recommendations/advice -- As may be noted above.  Continue to utilize previously learned skills ad lib. Medication compliance -- Maintain medication as prescribed and work faithfully with relevant prescriber(s) if any changes are desired or seem indicated. Crisis service -- Aware of call list and work-in appts.  Call the clinic on-call service, 988/hotline, 911, or present to [facility] or ER if any life-threatening psychiatric crisis. Followup -- Return for time as already scheduled.  Next scheduled visit with me 05/16/2023.  Next scheduled in this office 05/16/2023.  Robley Fries, PhD Marliss Czar, PhD LP Clinical Psychologist, [facility] [address] [place], [place] [address] [phone]

## 2023-05-16 ENCOUNTER — Ambulatory Visit (INDEPENDENT_AMBULATORY_CARE_PROVIDER_SITE_OTHER): Payer: BC Managed Care – PPO | Admitting: Psychiatry

## 2023-05-16 DIAGNOSIS — E1142 Type 2 diabetes mellitus with diabetic polyneuropathy: Secondary | ICD-10-CM

## 2023-05-16 DIAGNOSIS — F4010 Social phobia, unspecified: Secondary | ICD-10-CM

## 2023-05-16 DIAGNOSIS — M48062 Spinal stenosis, lumbar region with neurogenic claudication: Secondary | ICD-10-CM | POA: Diagnosis not present

## 2023-05-16 DIAGNOSIS — G20A1 Parkinson's disease without dyskinesia, without mention of fluctuations: Secondary | ICD-10-CM

## 2023-05-16 DIAGNOSIS — Z9189 Other specified personal risk factors, not elsewhere classified: Secondary | ICD-10-CM

## 2023-05-16 DIAGNOSIS — F331 Major depressive disorder, recurrent, moderate: Secondary | ICD-10-CM

## 2023-05-16 DIAGNOSIS — Z87898 Personal history of other specified conditions: Secondary | ICD-10-CM

## 2023-05-16 NOTE — Progress Notes (Signed)
Psychotherapy Progress Note [facility] Douglas Price, Douglas Price LP  Patient ID: Douglas Price "Douglas Price")    MRN: [id number] Therapy format: Individual psychotherapy Date: 05/16/2023      Start: 3:13p     Stop: 4:00p     Time Spent: 47 min Location: In-person   Session narrative (presenting needs, interim history, self-report of stressors and symptoms, applications of prior therapy, status changes, and interventions made in session) Had his hand surgery (trigger finger and cyst removal).  Brother Douglas Price and sister Douglas Price have taken turns staying with him for several days at a time, and been helpful for material needs and sense of support.  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  Today is next to last day with Douglas Price, and he finds himself experiencing acute anticipatory separation anxiety.  Appreciative of all the work, and got the chance he hoped for for his siblings to see how his living space functions without sufficient air conditioning, but still no offers of financial assistance from either.  Says he is clear that Douglas Price categorically will not make any monetary gifts but that Douglas Price did buy some substantial materials for repairs he made without expecting return.  Interestingly, Douglas Price has discounted his prior pledge of funding to under right property tax by the amount of his hotel cost, and he did declare, perhaps insensitively, how he needs to sleep in air conditioning himself after attempting to spend 1 night.  But overall, Douglas Price wants to be careful not to paint either of them in a bad light, he just wishes he could see more of them and get his air conditioning work done and paid for.  Discussed options, confirmed that the best price he can get is about $7000, and he can only afford about 1000 himself.  So the viable alternative seems to be that he weather the remainder of the season, continue using the next few months to  pay off a debt recovery, then borrow funds from home equity before the next hot season.  Discussed separation anxiety.  Family history noted of F dying at age 51, when Douglas Price was 7 and Douglas Price was 72yo.  Douglas Price was adopted before either was born.  Affirmed how early loss would have to sensitize him to aloneness, as has his longstanding identity as an abstinent, closeted homosexual.  Validated also that his current struggle with Parkinson's disease has to trigger existential anxiety and unavoidable concerns about eventually being unable to care for himself and real need for loyal, caring people to be with him more tangible.  Regarding Parkinson's, he has looked further at the recommended test from Dr. Arbutus Leas, with brother's online research, stating that his brother validated the idea that the test would not be able to definitively guide treatment.  Challenged about this, however, he he recalls that his neurologist said it would be able to guide her choices of medication.  He also gives an inconsistent story of his communication with her office after last visit, suggesting his memory is more susceptible to lapses in attention, emotional distortion, and confabulation than we would hope, and his comprehension and consent for medical procedures may need some more painstaking effort to enact proper care.  He also says bupropion has not "worked" (started in April).  Challenged what he hoped for, says he was hoping it would "take me out of my depressed state" and effectively cancel separation anxiety.  Supportively confronted that  those expectations are beyond what medication can just do, and the results he seeks will also require him to make lifestyle adjustments and connections he has not presently made.  Noted that he gets a healthy amount of socialization automatically through his employment, but summers and weekends are not really programmed enough to meet the need.  Resolved to get back to church, discussed options, and  chose to begin by asking to join his friend [name] and attending worship at [employer].  Further along, if Douglas Price sees fit, he can break his 2-year absence from his own church, presumably by asking the church office if they have anything resembling a transportation ministry or someone willing to be his buddy for a return to the worship space, however tentative.  Therapeutic modalities: Cognitive Behavioral Therapy, Solution-Oriented/Positive Psychology, Environmental manager, and Faith-sensitive  Mental Status/Observations:  Appearance:   Casual     Behavior:  Appropriate  Motor:  Walker dependent  Speech/Language:   Slowed but clear  Affect:  Appropriate and slowed reaction  Mood:  depressed  Thought process:  normal  Thought content:    Pessimistic thinking  Sensory/Perceptual disturbances:    WNL and except neuropathy and back pain  Orientation:  Fully oriented  Attention:  Good    Concentration:  Fair  Memory:  Grossly intact, subject to distortion  Insight:    Fair  Judgment:   Fair  Impulse Control:  Good   Risk Assessment: Danger to Self: No Self-injurious Behavior: No Danger to Others: No Physical Aggression / Violence: No Duty to Warn: No Access to Firearms a concern: No  Assessment of progress:  progressing  Diagnosis:   ICD-10-CM   1. Major depressive disorder, recurrent episode, moderate (HCC)  F33.1     2. Social anxiety disorder  F40.10     3. Parkinson's disease without dyskinesia or fluctuating manifestations  G20.A1     4. 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 Lack of air conditioning in home environment  Z91.89     6. History of benzodiazepine dependence  Z87.898     7. Diabetic polyneuropathy associated with type 2 diabetes mellitus (HCC)  E11.42      Plan:  Parkinson's and other health care -- Re-calendar biopsy or engage and pay attention to what the value of biopsy is to treatment and life  planning.  Ongoing encouragement to check assumptions with all doctors, don't settle for cynicism.  Follow through with other needed procedures Financial concerns and family relations -- Approach siblings again as desired for financial assistance but be clearer that he is willing and able to pay part of the cost, transparent about money management if they need to verify his need, and clear enough about why it matters for his health.  Practically, can divert assistance already pledged for property tax to [facility].  Encourage thinking through how much of the cost he might finance himself, as a measure of good faith, when begging charity.  Use strategies discussed to verify and dispel verbalized or presumed concerns about his honesty and financial competency, if they exist, including letting them know it's OK to ask questions and OK to express doubts if they have them.  Worst case, if no assistance agreed, approach power company or social services about possible resident assistance programs rather than assume he is out of luck altogether.  Notice and dispute idealistic thinking that they should figure things out and offer charity, notice and dispute  unnecessarily cynical thinking about not caring, practice being clear about requests and willing to ask if he is seen as irresponsible somehow. Social isolation -- Enact plan to go to church with friend [name].  At discretion, reach out to his own church to establish a transportation helper or buddy to join him. Other recommendations/advice -- As may be noted above.  Continue to utilize previously learned skills ad lib. Medication compliance -- Maintain medication as prescribed and work faithfully with relevant prescriber(s) if any changes are desired or seem indicated. Crisis service -- Aware of call list and work-in appts.  Call the clinic on-call service, 988/hotline, 911, or present to [facility] or ER if any life-threatening psychiatric crisis. Followup -- Return for time as  already scheduled.  Next scheduled visit with me 05/30/2023.  Next scheduled in this office 05/22/2023.  Douglas Fries, Douglas Price Douglas Price, Douglas Price LP Clinical Psychologist, [facility] [address] [place], [place] [address] [phone]

## 2023-05-22 ENCOUNTER — Ambulatory Visit (INDEPENDENT_AMBULATORY_CARE_PROVIDER_SITE_OTHER): Payer: BC Managed Care – PPO | Admitting: Physician Assistant

## 2023-05-22 ENCOUNTER — Encounter: Payer: Self-pay | Admitting: Physician Assistant

## 2023-05-22 DIAGNOSIS — F4010 Social phobia, unspecified: Secondary | ICD-10-CM

## 2023-05-22 DIAGNOSIS — F411 Generalized anxiety disorder: Secondary | ICD-10-CM

## 2023-05-22 DIAGNOSIS — F331 Major depressive disorder, recurrent, moderate: Secondary | ICD-10-CM

## 2023-05-22 DIAGNOSIS — G20C Parkinsonism, unspecified: Secondary | ICD-10-CM

## 2023-05-22 MED ORDER — TEMAZEPAM 15 MG PO CAPS: 15.0000 mg | ORAL_CAPSULE | Freq: Every evening | ORAL | 1 refills | Status: DC | PRN

## 2023-05-22 MED ORDER — BUPROPION HCL ER (XL) 150 MG PO TB24: 450.0000 mg | ORAL_TABLET | Freq: Every day | ORAL | Status: DC

## 2023-05-22 MED ORDER — CLONAZEPAM 0.5 MG PO TABS: 0.5000 mg | ORAL_TABLET | Freq: Two times a day (BID) | ORAL | 1 refills | Status: DC | PRN

## 2023-05-22 NOTE — Progress Notes (Signed)
[facility]  Patient ID: Douglas Price,  MRN: [id number]  PCP: Douglas Greathouse, MD  Date of Evaluation: 05/22/2023 Time spent:20 minutes  Chief Complaint:  Chief Complaint   Anxiety; Depression; Insomnia; Follow-up    HISTORY/CURRENT STATUS: HPI  For routine med check.  Douglas Price is having a bit more anxiety than usual.  School has started back and in the afternoon after the Klonopin wears off he is much more anxious.  He feels overwhelmed with all the noise.  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  He denies panic attacks although feels like these episodes could turn into 1.  He occasionally only takes 300 mg of the Wellbutrin if he feels like there will be more reason for anxiety during the day.   Patient is able to enjoy things.  Energy and motivation are fair. No extreme sadness, tearfulness, or feelings of hopelessness.  He does not sleep without the temazepam.  Klonopin does not cause drowsiness.  ADLs are normal for him.  He has to use the rolader walker so that limits what he can do.  Personal hygiene is normal.  Denies any changes in concentration, making decisions, or remembering things.  Appetite has not changed.  Weight is Price.  Denies suicidal or homicidal thoughts.  Review of Systems  Constitutional:  Positive for malaise/fatigue.  HENT: Negative.    Eyes: Negative.   Respiratory: Negative.    Cardiovascular: Negative.   Gastrointestinal: Negative.   Genitourinary: Negative.   Musculoskeletal: Negative.   Skin: Negative.   Neurological:        See HPI  Endo/Heme/Allergies: Negative.   Psychiatric/Behavioral:         See HPI   Individual Medical History/ Review of Systems: Changes? :Yes   Had left hand surgery for trigger finger and mucoid cyst removed.   Past medications for mental health diagnoses include: Cymbalta, Wellbutrin, Abilify,  Klonopin, Xanax, Temazapam  Allergies: Avelox [moxifloxacin], Quinolones, and Lexapro [escitalopram]  Current Medications:  Current Outpatient Medications:    buPROPion (WELLBUTRIN XL) 300 MG 24 hr tablet, Take one tablet 300 mg daily, Disp: 90 tablet, Rfl: 1   clonazePAM (KLONOPIN) 0.5 MG tablet, Take 1 tablet (0.5 mg total) by mouth 2 (two) times daily as needed for anxiety., Disp: 60 tablet, Rfl: 1   DULoxetine (CYMBALTA) 60 MG capsule, Take 120 mg by mouth daily. , Disp: , Rfl:    dutasteride (AVODART) 0.5 MG capsule, Take 0.5 mg by mouth daily., Disp: , Rfl:    HYDROcodone-acetaminophen (NORCO/VICODIN) 5-325 MG tablet, , Disp: , Rfl:    HYDROcodone-acetaminophen (NORCO/VICODIN) 5-325 MG tablet, 1-2 tabs PO q6 hours prn pain, Disp: 15 tablet, Rfl: 0   loratadine (CLARITIN) 10 MG tablet, Take 10 mg by mouth daily., Disp: , Rfl:    metFORMIN (GLUCOPHAGE) 500 MG tablet, Take 500 mg by mouth daily with breakfast., Disp: , Rfl:    mirtazapine (REMERON) 15 MG tablet, Take 15 mg by mouth at bedtime., Disp: , Rfl:    nebivolol (BYSTOLIC) 5 MG tablet, TK 1 T PO D, Disp: , Rfl:    omeprazole (PRILOSEC) 20 MG capsule, Take 20 mg by mouth 2 (two) times daily before a meal. , Disp: , Rfl:    pregabalin (LYRICA) 150 MG capsule, Take 150 mg by mouth 2 (two) times daily., Disp: , Rfl:    tamsulosin (FLOMAX) 0.4 MG CAPS capsule,  Take 0.4 mg by mouth daily., Disp: , Rfl:    buPROPion (WELLBUTRIN XL) 150 MG 24 hr tablet, Take 3 tablets (450 mg total) by mouth daily., Disp: , Rfl:    temazepam (RESTORIL) 15 MG capsule, Take 1 capsule (15 mg total) by mouth at bedtime as needed for sleep., Disp: 30 capsule, Rfl: 1 Medication Side Effects: none  Family Medical/ Social History: Changes? No  MENTAL HEALTH EXAM:  There were no vitals taken for this visit.There is no height or weight on file to calculate BMI.  General Appearance: Casual and Well Groomed  Eye Contact:  Good  Speech:  Clear and Coherent and  Normal Rate  Volume:  Normal  Mood:  Euthymic  Affect:  Congruent  Thought Process:  Goal Directed and Descriptions of Associations: Circumstantial  Orientation:  Full (Time, Place, and Person)  Thought Content: Logical   Suicidal Thoughts:  No  Homicidal Thoughts:  No  Memory:  WNL  Judgement:  Good  Insight:  Good  Psychomotor Activity:   walks with rolling walker, stoops forward, shuffling gate. Difficulty rising from seated position but able to use arms of chair to get up without assistance. RUE pill rolling tremor at rest and of tremor of forearm  Concentration:  Concentration: Good  Recall:  Good  Fund of Knowledge: Good  Language: Good  Assets:  Communication Skills Desire for Improvement Housing Resilience Transportation  ADL's:  Impaired  Cognition: WNL  Prognosis:  Good   DIAGNOSES:    ICD-10-CM   1. Major depressive disorder, recurrent episode, moderate (HCC)  F33.1     2. Social anxiety disorder  F40.10     3. Generalized anxiety disorder  F41.1     4. Parkinsonism, unspecified Parkinsonism type  G20.C       Receiving Psychotherapy: Yes  with Douglas Price  RECOMMENDATIONS:  PDMP reviewed.  Temazepam given 05/10/2023.  Klonopin filled 04/21/2023.  Lyrica known to me. I provided 20 minutes of face to face time during this encounter, including time spent before and after the visit in records review, medical decision making, counseling pertinent to today's visit, and charting.   We had a long discussion concerning the benzodiazepines.  He has been on clonazepam and temazepam for a very long time, and is unable to go to sleep if he does not take the temazepam.  The Klonopin helps him manage the anxiety throughout the day although it does not last long enough into the afternoon for him to comfortably work.  It does not cause drowsiness but is effective against the anxiety.  I recommend him taking Klonopin in the morning and then again around lunchtime, and then the  temazepam for sleep.  He understands it is not typical to be on 2 different benzodiazepines, and even if he was only taking one that could increase the risk of imbalance, falls, confusion, and any sequela due to those issues.  We agree that this is a quality of life issue, and without having the Klonopin he would not be able to function due to the anxiety.  He is willing to accept the risk of falls and/or confusion.  Hydroxyzine was offered as an option but it is not without side effects, including severe sedation, dry mouth, confusion, and others.  It is my opinion that both benzodiazepines are necessary in his case. Sleep hygiene discussed.  Continue Wellbutrin XL 450 mg daily. ( Ok to decrease to 300 mg if the anxiety does not improve with Klonopin  afternoon dose addition.) Increase Klonopin 0.5 mg, 1 po bid. (1 every morning and 1 around lunchtime as needed.) Continue Cymbalta 60 mg, 2 po every day.  Continue Mirtazapine 15 mg, 1 at bedtime. Continue Lyrica 150 mg, 1 po bid. Continue Temazepam 15 mg, at bedtime.  Continue therapy with Douglas Price. Return in 4 weeks.  [name], PA-C

## 2023-05-24 DIAGNOSIS — H04123 Dry eye syndrome of bilateral lacrimal glands: Secondary | ICD-10-CM | POA: Diagnosis not present

## 2023-05-30 ENCOUNTER — Ambulatory Visit (INDEPENDENT_AMBULATORY_CARE_PROVIDER_SITE_OTHER): Payer: BC Managed Care – PPO | Admitting: Psychiatry

## 2023-05-30 DIAGNOSIS — G20C Parkinsonism, unspecified: Secondary | ICD-10-CM

## 2023-05-30 DIAGNOSIS — F331 Major depressive disorder, recurrent, moderate: Secondary | ICD-10-CM | POA: Diagnosis not present

## 2023-05-30 DIAGNOSIS — F411 Generalized anxiety disorder: Secondary | ICD-10-CM

## 2023-05-30 DIAGNOSIS — F4010 Social phobia, unspecified: Secondary | ICD-10-CM | POA: Diagnosis not present

## 2023-05-30 NOTE — Progress Notes (Signed)
Psychotherapy Progress Note [facility] Marliss Czar, PhD LP  Patient ID: Douglas Price [facility] "[name]")    MRN: [id number] Therapy format: Individual psychotherapy Date: 05/30/2023      Start: 3:15p     Stop: 4:03p     Time Spent: 48 min Location: In-person   Session narrative (presenting needs, interim history, self-report of stressors and symptoms, applications of prior therapy, status changes, and interventions made in session) Since last seen, Okey Regal went home and back to school.  Debt repayment plan finishes out next month, then he'll be able to reallocate $489 to a [employer] card currently at $4K load (9 months to settle).  Does fel weaker walking this year than he felt last year, attributes to wearying effects of the hot summer and progression of Parkinson's.  Still not taking any medication, nor allowing biopsy, but does accept that exercise helps, and he routinely parks at the far end of the parking lot (to keep from scraping any other cars, but it works as a serendipitous exercise program).  Meanwhile, he is finding it harder to do his work -- right hand dominant, tremors more, and coming to be unable to write.  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  No particular anxiety about his job this year.  Trouble getting his watch on now (leather strap).    Context for understanding [name] and [employer] -- [name]'s wife is an anxious recluse, ostensibly part of why [name] would want Bill to persist as long as possible working.  His goal for retirement to live in reach of family, either [place] [name]) or [place] Okey Regal).  Discussed   Has not decided to allow the biopsy to determine which type of Parkinson's he has.  Still stuck on the idea that it will not yield actionable information, even though we discussed this last  time.  Encouraged to still try it, as it can mean months of better functioning if he will inform and refine treatment sooner.  Has not engaged [name] about going to church together.  On remembering, oversimplified the idea and resisting b/c he didn't want to join [employer].  Clarified the idea and encouraged try again.  Therapeutic modalities: Cognitive Behavioral Therapy, Solution-Oriented/Positive Psychology, and Ego-Supportive  Mental Status/Observations:  Appearance:   Casual     Behavior:  Rigid  Motor:  Parkinsonian gait, rollator dependent  Speech/Language:   Clear and Coherent and Slow  Affect:  Appropriate  Mood:  dysthymic  Thought process:  normal  Thought content:    WNL and illusions  Sensory/Perceptual disturbances:    WNL  Orientation:  Fully oriented  Attention:  Good    Concentration:  Good  Memory:  grossly intact  Insight:    Good  Judgment:   Fair  Impulse Control:  Good   Risk Assessment: Danger to Self: No Self-injurious Behavior: No Danger to Others: No Physical Aggression / Violence: No Duty to Warn: No Access to Firearms a concern: No  Assessment of progress:  stabilized  Diagnosis:   ICD-10-CM   1. Major depressive disorder, recurrent episode, moderate (HCC)  F33.1     2. Social anxiety disorder  F40.10     3. Generalized anxiety disorder  F41.1     4. Parkinsonism, unspecified Parkinsonism type  G20.C      Plan:  Parkinson's and other health care -- Encourage rethinking refusal to biopsy, since it could inform better care that would buy him some more functional time.  Ongoing encouragement to check assumptions with all doctors, don't settle for cynicism or what feelings say.  Follow through with other needed procedures. Financial concerns and family relations -- Discretion to re-approach siblings about terms of financial assistance or at least clarify terms and reasons for refusal, on understanding that an honestly explained "no" is more  palatable and less depressogenic than assumptions and mindreading.  Practically speaking, he will not be able to begin funding his own HVAC repair until next June and would not be able to get the work don before next summer's heat hits.  May look into resident or patient assistance options.  Encourage thinking through how much of the cost he might finance himself, as a measure of good faith, when begging charity.  Use strategies discussed to verify and dispel verbalized or presumed concerns about his honesty and financial competency, if they exist, including letting them know it's OK to ask questions and OK to express doubts if they have them.  Worst case, if no assistance agreed, approach power company or social services about possible resident assistance programs rather than assume he is out of luck altogether.  Notice and dispute idealistic thinking that they should figure things out and offer charity, notice and dispute unnecessarily cynical thinking about not caring, practice being clear about requests and willing to ask if he is seen as irresponsible somehow. Social isolation -- Enact plan to go to church with friend [name].  At discretion, reach out to his own church to establish a transportation helper or buddy to join him. Other recommendations/advice -- As may be noted above.  Continue to utilize previously learned skills ad lib. Medication compliance -- Maintain medication as prescribed and work faithfully with relevant prescriber(s) if any changes are desired or seem indicated. Crisis service -- Aware of call list and work-in appts.  Call the clinic on-call service, 988/hotline, 911, or present to [facility] or ER if any life-threatening psychiatric crisis. Followup -- Return for time as already scheduled.  Next scheduled visit with me 06/11/2023.  Next scheduled in this office 06/11/2023.  Robley Fries, PhD Marliss Czar, PhD LP Clinical Psychologist, [facility] [address] [place], [place] [address] [phone]

## 2023-06-09 NOTE — Progress Notes (Incomplete)
Psychotherapy Progress Note [facility] Marliss Czar, PhD LP  Patient ID: Douglas Price [facility] "[name]")    MRN: [id number] Therapy format: Individual psychotherapy Date: 05/30/2023      Start: 3:15p     Stop: 4:03p     Time Spent: 48 min Location: In-person   Session narrative (presenting needs, interim history, self-report of stressors and symptoms, applications of prior therapy, status changes, and interventions made in session) Since last seen, Douglas Price went home and back to school.  Debt repayment plan finishes out next month, then he'll be able to reallocate $489 to a [employer] card currently at $4K load (9 months to settle).  Does fel weaker walking this year than he felt last year, attributes to wearying effects of the hot summer and progression of Parkinson's.  Still not taking any medication, nor allowing biopsy, but does accept that exercise helps, and he routinely parks at the far end of the parking lot (to keep from scraping any other cars, but it works as a serendipitous exercise program).  Meanwhile, he is finding it harder to do his work -- right hand dominant, tremors more, and coming to be unable to write.  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  No particular anxiety about his job this year.  Trouble getting his watch on now (leather strap).    Context for understanding [name] and [employer] -- [name]'s wife is an anxious recluse, ostensibly part of why [name] would want Bill to persist as long as possible working.  His goal for retirement to live in reach of family, either [place] [name]) or [place] Douglas Price).  Discussed   Has not decided to allow the biopsy to determine which type of Parkinson's he has.  Still stuck on the idea that it will not yield actionable information, even though we discussed this last  time.  Encouraged to still try it, as it can mean months of better functioning if he will inform and refine treatment sooner.  Therapeutic modalities: Cognitive Behavioral Therapy, Solution-Oriented/Positive Psychology, and Ego-Supportive  Mental Status/Observations:  Appearance:   Casual     Behavior:  Rigid  Motor:  Parkinsonian gait, rollator dependent  Speech/Language:   Clear and Coherent and Slow  Affect:  Appropriate  Mood:  dysthymic  Thought process:  normal  Thought content:    WNL and illusions  Sensory/Perceptual disturbances:    WNL  Orientation:  Fully oriented  Attention:  Good    Concentration:  Good  Memory:  grossly intact  Insight:    Good  Judgment:   Fair  Impulse Control:  Good   Risk Assessment: Danger to Self: No Self-injurious Behavior: No Danger to Others: No Physical Aggression / Violence: No Duty to Warn: No Access to Firearms a concern: No  Assessment of progress:  stabilized  Diagnosis: No diagnosis found. Plan:  Parkinson's and other health care -- Encourage rethinking refusal to biopsy, since it could inform better care that would buy him some more functional time.  Ongoing encouragement to check assumptions with all doctors, don't settle for cynicism or what feelings say.  Follow through with other needed procedures. Financial concerns and family relations -- Discretion to re-approach siblings about terms of financial assistance or at least clarify terms and reasons for refusal, on understanding that an honestly explained "no" is  more palatable and less depressogenic than assumptions and mindreading.  Practically speaking, he will not be able to begin funding his own HVAC repair until next June and would not be able to get the work don before next summer's heat hits.     again as desired for financial assistance but be clearer that he is willing and able to pay part of the cost, transparent about money management if they need to verify his need,  and clear enough about why it matters for his health.  Practically, can divert assistance already pledged for property tax to [facility].  Encourage thinking through how much of the cost he might finance himself, as a measure of good faith, when begging charity.  Use strategies discussed to verify and dispel verbalized or presumed concerns about his honesty and financial competency, if they exist, including letting them know it's OK to ask questions and OK to express doubts if they have them.  Worst case, if no assistance agreed, approach power company or social services about possible resident assistance programs rather than assume he is out of luck altogether.  Notice and dispute idealistic thinking that they should figure things out and offer charity, notice and dispute unnecessarily cynical thinking about not caring, practice being clear about requests and willing to ask if he is seen as irresponsible somehow. Social isolation -- Enact plan to go to church with friend [name].  At discretion, reach out to his own church to establish a transportation helper or buddy to join him. Other recommendations/advice -- As may be noted above.  Continue to utilize previously learned skills ad lib. Medication compliance -- Maintain medication as prescribed and work faithfully with relevant prescriber(s) if any changes are desired or seem indicated. Crisis service -- Aware of call list and work-in appts.  Call the clinic on-call service, 988/hotline, 911, or present to [facility] or ER if any life-threatening psychiatric crisis. Followup -- Return for time as already scheduled.  Next scheduled visit with me 06/11/2023.  Next scheduled in this office 06/11/2023.  Robley Fries, PhD Marliss Czar, PhD LP Clinical Psychologist, [facility] [address] [place], [place] [address] [phone]

## 2023-06-10 ENCOUNTER — Telehealth: Payer: Self-pay | Admitting: Physician Assistant

## 2023-06-10 NOTE — Telephone Encounter (Signed)
Pharmacy sent PA Request for Temazepam 15mg  , Call [phone]  Member ID# [id number]

## 2023-06-11 ENCOUNTER — Ambulatory Visit (INDEPENDENT_AMBULATORY_CARE_PROVIDER_SITE_OTHER): Payer: BC Managed Care – PPO | Admitting: Psychiatry

## 2023-06-11 DIAGNOSIS — F331 Major depressive disorder, recurrent, moderate: Secondary | ICD-10-CM

## 2023-06-11 DIAGNOSIS — G20A1 Parkinson's disease without dyskinesia, without mention of fluctuations: Secondary | ICD-10-CM | POA: Diagnosis not present

## 2023-06-11 DIAGNOSIS — E1142 Type 2 diabetes mellitus with diabetic polyneuropathy: Secondary | ICD-10-CM

## 2023-06-11 DIAGNOSIS — F4010 Social phobia, unspecified: Secondary | ICD-10-CM

## 2023-06-11 DIAGNOSIS — M48062 Spinal stenosis, lumbar region with neurogenic claudication: Secondary | ICD-10-CM | POA: Diagnosis not present

## 2023-06-11 NOTE — Progress Notes (Signed)
Psychotherapy Progress Note [facility] Marliss Czar, PhD LP  Patient ID: [facility] "[name]")    MRN: [id number] Therapy format: Individual psychotherapy Date: 06/11/2023      Start: 4:11p     Stop: 4:58p     Time Spent: 47 min Location: In-person   Session narrative (presenting needs, interim history, self-report of stressors and symptoms, applications of prior therapy, status changes, and interventions made in session) Brings family photo of his GGF ([id number]) at age 70.  Distinct tremor trying to pull up the photo on his phone.  Notable ancestor, descendant of Education officer, environmental and in a [employer].  One helpful pastime to gather family history.  Used to be very active in it 30 years ago.  Did DNA test through [email or web address], surprised to find no [place] genetic history, a lot of [place] and [place].    Was warming up to go to church with [name], but [name] got sick.  Will try again.  Might even revisit his own church, not seen in 2 years.  Encouraged he can ask about transportation or mobility assistance, even if it feels awkward.  Reports 3 falling incidents at school last week, the first time in 70 years.  Unsure if he got dizzy or what, but first one fell in his office and couldn't gt himself up.  Embarrassed to have to be helped up and to have administration pay concerned attention to making sure he has another staff member nearby and swing by to monitor him for apparent safety and welfare.  Last Friday fell asleep in a chair during band and fell off sideways onto the floor, banging his head, no injury.  3rd incident the same day, lost balance loading his walker into his car, unusually parked on an incline, and tumbled onto his elbow, minor scrapes.  No nausea or visual anomalies, did have moderate HA from Friday till yesterday.  Figured out in session that he has been on a doubled pregabalin dose for neuropathy since 8/28, at the same time as  he has remained on 0.5mg  Klonopin BID, and the combination -- possibly in the context of dehydration and/or disordered sleep -- became too sedating, creating conditions enough to make for slowed reflexes losing axis.  Decision already made to drop back to previous 75mg  pregabalin, but temporarily in a fix b/c insurance held up the refill for an exception authorization (having already filled the higher) and all he has on hand is 150mg , so he did not take this morning's.  Hopefully to be sorted out soon.  Advised in the future to reduce clonazepam if he figures to increase pregabalin again, or any other sedating medication, as a fall precaution.  Otherwise, continues to cope easily with lack of air conditioning in this season, just will want to get clear if he can continue to borrow room units next summer. Financial plan remains to pay off debt relief.  Looming worry is how long he can continue to work with a progressive condition.  Reviewed his neurologist's recommendation and reaffirmed that the test whose expense he has declined is capable of guiding better treatment, and better treatment holds the promise of longer quality of life.  Agreed to undergo the test, deciding $381 (if accurate) is a fair price for that and he can swing it financially.  With his permission, notified his neurologist and committed himself to call to make arrangements.  Therapeutic modalities: Cognitive Behavioral Therapy, Solution-Oriented/Positive Psychology, Ego-Supportive, and Assertiveness/Communication  Mental Status/Observations:  Appearance:   Casual     Behavior:  Appropriate  Motor:  parkinson's  Speech/Language:   Clear and Coherent and Slow  Affect:  Appropriate  Mood:  dysthymic and stable  Thought process:  normal and slowed  Thought content:    WNL  Sensory/Perceptual disturbances:    WNL  Orientation:  Fully oriented  Attention:  Good    Concentration:  Good  Memory:  grossly intact  Insight:    Good   Judgment:   Variable  Impulse Control:  Good   Risk Assessment: Danger to Self: No Self-injurious Behavior: No Danger to Others: No Physical Aggression / Violence: No Duty to Warn: No Access to Firearms a concern: No  Assessment of progress:  progressing, improving health care judgment   Diagnosis:   ICD-10-CM   1. Major depressive disorder, recurrent episode, moderate (HCC)  F33.1     2. Social anxiety disorder  F40.10     3. Parkinson's disease without dyskinesia or fluctuating manifestations  G20.A1     4. 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 Diabetic polyneuropathy associated with type 2 diabetes mellitus (HCC)  E11.42      Plan:  Parkinson's and other medical care -- Follow through on renewed commitment to allow testing, in hopes of informing care and buying himself more functional time with progressive disease.  Ongoing encouragement to check assumptions with all doctors, don't settle for cynicism or what feelings say.  Follow through with other needed procedures.  Ensure good hydration.  If adding sedating medication of any kind, consider reducing Klonopin to compensate for fall risk. Financial concerns and family relations -- Continue to pay off debt faithfully and save for needed repairs.  Practically speaking, he will not be able to begin funding HVAC repair until next June and can not on his own pay for the work by the time it's needed.  May look into resident or patient assistance options.  Encourage thinking through how much of the cost he might finance himself, as a measure of good faith, when begging charity.  Discretion to re-approach siblings about financial assistance or at least clarify terms and reasons for refusal, on understanding that an honestly explained "no" is simultaneously more palatable, less depressogenic, and more functional for them  than assumptions, avoidance, and mindreading.  Use other strategies discussed to  verify and address any spoken or unspoken family concerns about his honesty and financial competency, if they exist, including letting them know it's OK to ask questions and OK to express doubts if they have them.  Worst case, if no assistance agreed, approach power company and social services about possible resident assistance programs rather than assume he is out of luck altogether.  Notice and dispute any idealistic thinking that they should see and figure things out and offer without bing asked, and dispute unnecessarily cynical thinking about them not caring.  For all needs, practice being clear about requests, willing to ask how he's coming across, and grateful for help provided. Social isolation -- Enact plan to return to church in one way or another.  At discretion, reach out to his own church to establish a transportation helper or buddy to join him. Other recommendations/advice -- As may be noted above.  Continue to utilize previously learned skills ad lib. Medication compliance -- Maintain medication as prescribed and work faithfully with relevant prescriber(s) if any changes are desired or seem indicated. Crisis service --  Aware of call list and work-in appts.  Call the clinic on-call service, 988/hotline, 911, or present to [facility] or ER if any life-threatening psychiatric crisis. Followup -- Return for time as already scheduled.  Next scheduled visit with me 06/26/2023.  Next scheduled in this office 06/26/2023.  Robley Fries, PhD Marliss Czar, PhD LP Clinical Psychologist, [facility] [address] [place], [place] [address] [phone]

## 2023-06-24 DIAGNOSIS — H353132 Nonexudative age-related macular degeneration, bilateral, intermediate dry stage: Secondary | ICD-10-CM | POA: Diagnosis not present

## 2023-06-24 DIAGNOSIS — H524 Presbyopia: Secondary | ICD-10-CM | POA: Diagnosis not present

## 2023-06-24 DIAGNOSIS — E119 Type 2 diabetes mellitus without complications: Secondary | ICD-10-CM | POA: Diagnosis not present

## 2023-06-24 DIAGNOSIS — H52203 Unspecified astigmatism, bilateral: Secondary | ICD-10-CM | POA: Diagnosis not present

## 2023-06-24 DIAGNOSIS — H04123 Dry eye syndrome of bilateral lacrimal glands: Secondary | ICD-10-CM | POA: Diagnosis not present

## 2023-06-24 DIAGNOSIS — H43813 Vitreous degeneration, bilateral: Secondary | ICD-10-CM | POA: Diagnosis not present

## 2023-06-26 ENCOUNTER — Ambulatory Visit (INDEPENDENT_AMBULATORY_CARE_PROVIDER_SITE_OTHER): Payer: BC Managed Care – PPO | Admitting: Psychiatry

## 2023-06-26 ENCOUNTER — Telehealth: Payer: Self-pay | Admitting: Neurology

## 2023-06-26 DIAGNOSIS — F4010 Social phobia, unspecified: Secondary | ICD-10-CM

## 2023-06-26 DIAGNOSIS — G20A1 Parkinson's disease without dyskinesia, without mention of fluctuations: Secondary | ICD-10-CM | POA: Diagnosis not present

## 2023-06-26 DIAGNOSIS — M48062 Spinal stenosis, lumbar region with neurogenic claudication: Secondary | ICD-10-CM

## 2023-06-26 DIAGNOSIS — F331 Major depressive disorder, recurrent, moderate: Secondary | ICD-10-CM | POA: Diagnosis not present

## 2023-06-26 DIAGNOSIS — Z9189 Other specified personal risk factors, not elsewhere classified: Secondary | ICD-10-CM | POA: Diagnosis not present

## 2023-06-26 NOTE — Progress Notes (Signed)
Psychotherapy Progress Note [facility] Marliss Czar, PhD LP  Patient ID: [facility] "[name]")    MRN: [id number] Therapy format: Individual psychotherapy Date: 06/26/2023      Start: 5:10p     Stop: 5:57p     Time Spent: 47 min Location: In-person   Session narrative (presenting needs, interim history, self-report of stressors and symptoms, applications of prior therapy, status changes, and interventions made in session) Did call neurologist's office, left message, awaiting scheduling for the Syn-One test.  Seems to be reliable that his cost would be $381, still regards it as worthwhile.    Today is 72th birthday, and siblings are coming in this weekend.  Wants to return to the subject with them of how intolerable summer gets without [facility].  Discussed at some length how to frame his interest, differentiating direct vs indirect approach, and martyring vs authentic ways of making a request.  Agreed he probably did war down neurologically for suffering through like he did this summer, and there is a credible argument that Parkinson's care demands adequate climate control, just making sure not to sound like he's playing the victim but wisely trying to prevent next year repeating this.  Prepared for attempts to defer or distort the issue, and prepared alternative requests (partial subsidy from them, or a family loan on favorable terms, combined with borrowing against home equity once he has cleared current debt repayment.  Reviewed things he says have been said to him that seem to be guilting him, or at least questioning some spending as frivolous -- e.g., books, which he gets used, and certainly cost less than a telecom bundle plan, and which can't possibly create the kind of savings he would need -- and verbal tactics to kindly check messages, pin down sibs if they are trying to manipulate rather than say an honest "no", and even offer that if they just mean no, they just  believe it has to be his own job to do, it would be hard to swallow, but it's still better to say it plainly than leave it all to interpretation and assumptions.  Also advised to ask them to do the math with him if they believe he needs any "bootstraps" advice instead.  Therapeutic modalities: Cognitive Behavioral Therapy, Solution-Oriented/Positive Psychology, Ego-Supportive, and Assertiveness/Communication  Mental Status/Observations:  Appearance:   Casual     Behavior:  Appropriate  Motor:  Rollator, Parkinson's gait, 1 near loss of balance turning  Speech/Language:   Clear and Coherent  Affect:  Appropriate  Mood:  anxious and responsive , more empowered-sounding  Thought process:  normal  Thought content:    WNL  Sensory/Perceptual disturbances:    WNL  Orientation:  Fully oriented  Attention:  Good    Concentration:  Good  Memory:  WNL  Insight:    Variable  Judgment:   Good  Impulse Control:  Good   Risk Assessment: Danger to Self: No Self-injurious Behavior: No Danger to Others: No Physical Aggression / Violence: No Duty to Warn: No Access to Firearms a concern: No  Assessment of progress:  progressing  Diagnosis:   ICD-10-CM   1. Major depressive disorder, recurrent episode, moderate (HCC)  F33.1     2. Social anxiety disorder  F40.10     3. Parkinson's disease without dyskinesia or fluctuating manifestations (HCC)  G20.A1     4. 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 Lack of air conditioning  in home environment  Z91.89      Plan:  Parkinson's and other medical care -- Follow through on renewed commitment to allow testing, in hopes of informing care and buying himself more functional time with progressive disease.  Ongoing encouragement to check assumptions with all doctors, don't settle for cynicism or what feelings say.  Follow through with other needed procedures.  Ensure good hydration.  If adding sedating medication of  any kind, consider reducing Klonopin to compensate for fall risk. Financial concerns and family relations -- Continue to pay off debt faithfully and save for needed repairs.  Practically speaking, he will not be able to begin funding HVAC repair until next June and can not on his own pay for the work by the time it's needed.  May look into resident or patient assistance options.  Encourage thinking through how much of the cost he might finance himself, as a measure of good faith, when begging charity.  Discretion to re-approach siblings about financial assistance or at least clarify terms and reasons for refusal, on understanding that an honestly explained "no" is simultaneously more palatable, less depressogenic, and more functional for them  than assumptions, avoidance, and mindreading.  Use other strategies discussed to verify and address any spoken or unspoken family concerns about his honesty and financial competency, if they exist, including letting them know it's OK to ask questions and OK to express doubts if they have them.  Worst case, if no assistance agreed, approach power company and social services about possible resident assistance programs rather than assume he is out of luck altogether.  Notice and dispute any idealistic thinking that they should see and figure things out and offer without bing asked, and dispute unnecessarily cynical thinking about them not caring.  For all needs, practice being clear about requests, willing to ask how he's coming across, and grateful for help provided. Social isolation -- Enact plan to return to church in one way or another.  At discretion, reach out to his own church to establish a transportation helper or buddy to join him. Other recommendations/advice -- As may be noted above.  Continue to utilize previously learned skills ad lib. Medication compliance -- Maintain medication as prescribed and work faithfully with relevant prescriber(s) if any changes are  desired or seem indicated. Crisis service -- Aware of call list and work-in appts.  Call the clinic on-call service, 988/hotline, 911, or present to [facility] or ER if any life-threatening psychiatric crisis. Followup -- Return for time as already scheduled, avail earlier @ PT's need.  Next scheduled visit with me 07/26/2023.  Next scheduled in this office 06/28/2023.  Robley Fries, PhD Marliss Czar, PhD LP Clinical Psychologist, [facility] [address] [place], [place] [address] [phone]

## 2023-06-26 NOTE — Telephone Encounter (Signed)
Caller stated he discussed with dr tat in the past about having a biopsy done and he wanted to let her know that's he's ready to move forward with having that done.

## 2023-06-27 ENCOUNTER — Other Ambulatory Visit: Payer: Self-pay | Admitting: Physician Assistant

## 2023-06-27 NOTE — Telephone Encounter (Signed)
Called patient and left voicemail.

## 2023-06-28 ENCOUNTER — Encounter: Payer: Self-pay | Admitting: Physician Assistant

## 2023-06-28 ENCOUNTER — Ambulatory Visit (INDEPENDENT_AMBULATORY_CARE_PROVIDER_SITE_OTHER): Payer: BC Managed Care – PPO | Admitting: Physician Assistant

## 2023-06-28 DIAGNOSIS — G4700 Insomnia, unspecified: Secondary | ICD-10-CM

## 2023-06-28 DIAGNOSIS — F331 Major depressive disorder, recurrent, moderate: Secondary | ICD-10-CM

## 2023-06-28 DIAGNOSIS — F4010 Social phobia, unspecified: Secondary | ICD-10-CM

## 2023-06-28 DIAGNOSIS — F411 Generalized anxiety disorder: Secondary | ICD-10-CM

## 2023-06-28 DIAGNOSIS — G20A1 Parkinson's disease without dyskinesia, without mention of fluctuations: Secondary | ICD-10-CM | POA: Diagnosis not present

## 2023-06-28 MED ORDER — CLONAZEPAM 0.5 MG PO TABS: 0.5000 mg | ORAL_TABLET | Freq: Two times a day (BID) | ORAL | 1 refills | Status: DC | PRN

## 2023-06-28 NOTE — Progress Notes (Unsigned)
[facility]  Patient ID: Douglas Price,  MRN: [id number]  PCP: Chilton Greathouse, MD  Date of Evaluation: 06/28/2023 Time spent:20 minutes  Chief Complaint:  Chief Complaint   Anxiety; Depression; Insomnia; Follow-up    HISTORY/CURRENT STATUS: HPI  For routine med check.   Depressed, Unable to tolerate higher dose wellbutrin  Mouth movements   Individual Medical History/ Review of Systems: Changes? :Yes   Had left hand surgery for trigger finger and mucoid cyst removed.   Past medications for mental health diagnoses include: Cymbalta, Wellbutrin, Abilify, Klonopin, Xanax, Temazapam  Allergies: Avelox [moxifloxacin], Quinolones, and Lexapro [escitalopram]  Current Medications:  Current Outpatient Medications:    buPROPion (WELLBUTRIN XL) 150 MG 24 hr tablet, Take 3 tablets (450 mg total) by mouth daily., Disp: , Rfl:    buPROPion (WELLBUTRIN XL) 300 MG 24 hr tablet, Take one tablet 300 mg daily, Disp: 90 tablet, Rfl: 1   DULoxetine (CYMBALTA) 60 MG capsule, Take 120 mg by mouth daily. , Disp: , Rfl:    dutasteride (AVODART) 0.5 MG capsule, Take 0.5 mg by mouth daily., Disp: , Rfl:    HYDROcodone-acetaminophen (NORCO/VICODIN) 5-325 MG tablet, , Disp: , Rfl:    HYDROcodone-acetaminophen (NORCO/VICODIN) 5-325 MG tablet, 1-2 tabs PO q6 hours prn pain, Disp: 15 tablet, Rfl: 0   loratadine (CLARITIN) 10 MG tablet, Take 10 mg by mouth daily as needed., Disp: , Rfl:    metFORMIN (GLUCOPHAGE) 500 MG tablet, Take 500 mg by mouth daily with breakfast., Disp: , Rfl:    mirtazapine (REMERON) 15 MG tablet, Take 15 mg by mouth at bedtime., Disp: , Rfl:    nebivolol (BYSTOLIC) 5 MG tablet, TK 1 T PO D, Disp: , Rfl:    omeprazole (PRILOSEC) 20 MG capsule, Take 20 mg by mouth 2 (two) times daily before a meal. , Disp: , Rfl:    pregabalin (LYRICA) 150 MG capsule, Take 150 mg by mouth 2 (two) times daily., Disp: , Rfl:    tamsulosin (FLOMAX) 0.4 MG CAPS capsule, Take 0.4 mg  by mouth daily., Disp: , Rfl:    temazepam (RESTORIL) 15 MG capsule, Take 1 capsule (15 mg total) by mouth at bedtime as needed for sleep., Disp: 30 capsule, Rfl: 1   clonazePAM (KLONOPIN) 0.5 MG tablet, Take 1 tablet (0.5 mg total) by mouth 2 (two) times daily as needed for anxiety., Disp: 60 tablet, Rfl: 1 Medication Side Effects: none  Family Medical/ Social History: Changes? No  MENTAL HEALTH EXAM:  There were no vitals taken for this visit.There is no height or weight on file to calculate BMI.  General Appearance: Casual and Well Groomed  Eye Contact:  Good  Speech:  Clear and Coherent and Normal Rate  Volume:  Normal  Mood:  Euthymic  Affect:  Congruent  Thought Process:  Goal Directed and Descriptions of Associations: Circumstantial  Orientation:  Full (Time, Place, and Person)  Thought Content: Logical   Suicidal Thoughts:  No  Homicidal Thoughts:  No  Memory:  WNL  Judgement:  Good  Insight:  Good  Psychomotor Activity:   walks with rolling walker, stoops forward, shuffling gate. Difficulty rising from seated position but able to use arms of chair to get up without assistance. RUE pill rolling tremor at rest and of tremor of forearm  Concentration:  Concentration: Good  Recall:  Good  Fund of Knowledge: Good  Language: Good  Assets:  Communication Skills Desire for Improvement Housing Resilience Transportation  ADL's:  Impaired  Cognition: WNL  Prognosis:  Good   DIAGNOSES:    ICD-10-CM   1. Major depressive disorder, recurrent episode, moderate (HCC)  F33.1     2. Social anxiety disorder  F40.10      Receiving Psychotherapy: Yes  with Dr. Marliss Czar  RECOMMENDATIONS:  PDMP reviewed.  Temazepam given 06/11/2023.  Klonopin filled 05/23/2023.  Hydrocodone and Lyrica known to me.   Disc w/ dr. Arbutus Leas   ? Mirtazepine?   ? Pristiq    Leave a detailed message.   Continue Wellbutrin XL   300 mg (higher increased anxiety)                Klonopin 0.5 mg, 1  po bid. (1 every morning and 1 around lunchtime as needed.) Continue Cymbalta 60 mg, 2 po every day.  Continue Mirtazapine 15 mg, 1 at bedtime. Continue Lyrica 150 mg, 1 po bid. Continue Temazepam 15 mg, at bedtime.  Continue therapy with Dr. Marliss Czar. Return in 6 wk tentative  [name], PA-C

## 2023-07-08 ENCOUNTER — Telehealth: Payer: Self-pay | Admitting: Physician Assistant

## 2023-07-08 NOTE — Telephone Encounter (Signed)
Douglas Price called at 11:18 to request refill of Wellbutrin unless you have talked with Dr. Arbutus Leas and decided on a different medication. Please let him know what has been decided.[id number] [place], [place] - [address] AT [facility] [address]   Appt 08/09/23

## 2023-07-08 NOTE — Telephone Encounter (Signed)
I have called patient and left a VM on 07-03-23,07-04-23 and 07-08-23 to schedule the skin biopsy

## 2023-07-08 NOTE — Telephone Encounter (Signed)
Was last prescribed by Dr. Arbutus Leas in May for a 6-mo supply. Too early for a RF. ? If he is wanting Rosey Bath to take over prescribing Wellbutrin.

## 2023-07-09 NOTE — Telephone Encounter (Signed)
Tried to reach patient again and had to LVM.

## 2023-07-09 NOTE — Telephone Encounter (Signed)
LVM to RC 

## 2023-07-10 NOTE — Telephone Encounter (Signed)
Left third VM to RC.

## 2023-07-11 ENCOUNTER — Telehealth: Payer: Self-pay | Admitting: Physician Assistant

## 2023-07-11 NOTE — Telephone Encounter (Signed)
Douglas Price called again today and LM at 11:32 as follow up of previous call.  He said he is home today so you should be able to reach him.

## 2023-07-11 NOTE — Telephone Encounter (Signed)
LVM to RC 

## 2023-07-12 ENCOUNTER — Telehealth: Payer: Self-pay | Admitting: Neurology

## 2023-07-12 ENCOUNTER — Other Ambulatory Visit: Payer: Self-pay | Admitting: Physician Assistant

## 2023-07-12 MED ORDER — BUPROPION HCL ER (XL) 300 MG PO TB24: ORAL_TABLET | ORAL | 0 refills | Status: DC

## 2023-07-12 NOTE — Telephone Encounter (Signed)
Pt left a message with the after hour service on 07-12-23 at 12:47 pm     Caller states he needs to set up a procedure    I called him back but had to leave a Voice mail message for him to call us back

## 2023-07-12 NOTE — Telephone Encounter (Signed)
Rx sent for a 30-day supply and patient notified.

## 2023-07-12 NOTE — Telephone Encounter (Signed)
Patient said you were supposed to talk with Dr. Arbutus Leas about continuing Wellbutrin versus a different medication. I received a RF request from the pharmacy for Wellbutrin today. Please advise.

## 2023-07-12 NOTE — Telephone Encounter (Signed)
Sent in a 30-day supply and notified patient.

## 2023-07-12 NOTE — Telephone Encounter (Signed)
I recommend changing Wellbutrin to Auvelity. I thought he'd been called about this already. I apologize. Since he won't be able to get the [facility] today, have him get just a 1 month supply of the Wellbutrin. Another option would be to change Cymbalta to Viibryd or Trintellix. Will the Auvelity be denied b/c of his insurance or cost? If so, go ahead with the Wellbutrin and will change Cymbalta to Viibryd or Trintellix. I'll be out of the office now until Monday but we can work on the change then. Thanks.

## 2023-07-26 ENCOUNTER — Ambulatory Visit (INDEPENDENT_AMBULATORY_CARE_PROVIDER_SITE_OTHER): Payer: BC Managed Care – PPO | Admitting: Psychiatry

## 2023-07-26 DIAGNOSIS — M48062 Spinal stenosis, lumbar region with neurogenic claudication: Secondary | ICD-10-CM

## 2023-07-26 DIAGNOSIS — E1142 Type 2 diabetes mellitus with diabetic polyneuropathy: Secondary | ICD-10-CM | POA: Diagnosis not present

## 2023-07-26 DIAGNOSIS — F4010 Social phobia, unspecified: Secondary | ICD-10-CM

## 2023-07-26 DIAGNOSIS — F331 Major depressive disorder, recurrent, moderate: Secondary | ICD-10-CM

## 2023-07-26 DIAGNOSIS — G20A1 Parkinson's disease without dyskinesia, without mention of fluctuations: Secondary | ICD-10-CM | POA: Diagnosis not present

## 2023-07-26 NOTE — Progress Notes (Signed)
Psychotherapy Progress Note [facility] Marliss Czar, PhD LP  Patient ID: Douglas Price [facility] "[name]")    MRN: [id number] Therapy format: Individual psychotherapy Date: 07/26/2023      Start: 4:25p     Stop: 5:12p     Time Spent: 47 min Location: In-person   Session narrative (presenting needs, interim history, self-report of stressors and symptoms, applications of prior therapy, status changes, and interventions made in session) Saw Douglas Price for combination birthday.  Thankful for them bringing a supply of frozen dinners and doing a lot of cleaning, repairs, and landscape maintenance.  Decided not to bring up [facility], didn't want to risk the atmosphere.  Granted prerogative, encouraged to get clear whether he means to try to fund it all himself (very unlikely) or get ahead of next years's needs before hot weather repeats.  Still has not closed the loop with neurologist about doing the biopsy, though Douglas Price did volunteer the funding [phone]).  Confusion and assumptions about whether it will do any good -- reminded he was told clearly that it would help determine treatment which could slow, arrest, or delay progression of his disease if he wants that (yes).  Turns out he has tried to get in touch but he is unavailable for nearly all hours they could return the call.  With his permission messaged [facility] office to guide and advise.    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  Another who is 6th grade but pre-K in achievement (Douglas Price).  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  Manageable so far, but it entails more walking than usual, getting  some more fatigued through the week.  Shocked to have it revealed that his new colleague is a male married to another male on staff, but not inclined to act on it.  Encouraged to continue working together [name], focus on the work.  (As [name] is closeted himself, but believes homosexuality to be morally wrong, his own internal conflict is enough to manage.)  Therapeutic modalities: Cognitive Behavioral Therapy, Solution-Oriented/Positive Psychology, Environmental manager, and Faith-sensitive  Mental Status/Observations:  Appearance:   Casual     Behavior:  Appropriate  Motor:  Rollator dependent  Speech/Language:   Clear and Coherent and Slow  Affect:  Appropriate  Mood:  dysthymic  Thought process:  normal and slowed  Thought content:    WNL  Sensory/Perceptual disturbances:    WNL  Orientation:  Fully oriented  Attention:  Good    Concentration:  Good  Memory:  Mild impairment vs. repression  Insight:    Fair  Judgment:   Fair  Impulse Control:  Good   Risk Assessment: Danger to Self: No Self-injurious Behavior: No Danger to Others: No Physical Aggression / Violence: No Duty to Warn: No Access to Firearms a concern: No  Assessment of progress:  stabilized  Diagnosis:   ICD-10-CM   1. Major depressive disorder, recurrent episode, moderate (HCC)  F33.1     2. Social anxiety disorder  F40.10     3. Spinal stenosis of lumbar region with neurogenic claudication  M48.062     4. Parkinson's disease without dyskinesia  or fluctuating manifestations (HCC)  G20.A1     5. Diabetic polyneuropathy associated with type 2 diabetes mellitus (HCC)  E11.42      Plan:  Parkinson's and other medical care -- Follow through on renewed commitment to allow testing, in hopes of informing care and buying himself more functional time with progressive disease.  Ongoing encouragement to check assumptions with all doctors, don't settle for cynicism or what feelings say.  Follow through with other  needed procedures.  Ensure good hydration.  If adding sedating medication of any kind, consider reducing Klonopin to compensate for fall risk. Financial concerns and family relations -- Continue to pay off debt faithfully and save for needed repairs.  Practically speaking, he will not be able to begin funding HVAC repair until next June and can not on his own pay for the work by the time it's needed.  May look into resident or patient assistance options.  Encourage thinking through how much of the cost he might finance himself, as a measure of good faith, when begging charity.  Discretion to re-approach siblings about financial assistance or at least clarify terms and reasons for refusal, on understanding that an honestly explained "no" is simultaneously more palatable, less depressogenic, and more functional for them  than assumptions, avoidance, and mindreading.  Use other strategies discussed to verify and address any spoken or unspoken family concerns about his honesty and financial competency, if they exist, including letting them know it's OK to ask questions and OK to express doubts if they have them.  Worst case, if no assistance agreed, approach power company and social services about possible resident assistance programs rather than assume he is out of luck altogether.  Notice and dispute any idealistic thinking that they should see and figure things out and offer without bing asked, and dispute unnecessarily cynical thinking about them not caring.  For all needs, practice being clear about requests, willing to ask how he's coming across, and grateful for help provided. Social isolation -- Enact plan to return to church in one way or another.  At discretion, reach out to his own church to establish a transportation helper or buddy to join him. Other recommendations/advice -- As may be noted above.  Continue to utilize previously learned skills ad lib. Medication compliance -- Maintain medication as  prescribed and work faithfully with relevant prescriber(s) if any changes are desired or seem indicated. Crisis service -- Aware of call list and work-in appts.  Call the clinic on-call service, 988/hotline, 911, or present to [facility] or ER if any life-threatening psychiatric crisis. Followup -- Return for time as already scheduled.  Next scheduled visit with me 08/19/2023.  Next scheduled in this office 08/09/2023.  Robley Fries, PhD Marliss Czar, PhD LP Clinical Psychologist, [facility] [address] [place], [place] [address] [phone]

## 2023-08-01 ENCOUNTER — Telehealth: Payer: Self-pay | Admitting: Neurology

## 2023-08-01 NOTE — Telephone Encounter (Signed)
Left message for patient to call back to sch the skin biopsy with [name]

## 2023-08-02 ENCOUNTER — Telehealth: Payer: Self-pay | Admitting: Neurology

## 2023-08-02 NOTE — Telephone Encounter (Signed)
Left message to sch the skin biopsy with Dr Arbutus Leas

## 2023-08-09 ENCOUNTER — Ambulatory Visit: Payer: Medicare Other | Admitting: Physician Assistant

## 2023-08-10 ENCOUNTER — Other Ambulatory Visit: Payer: Self-pay | Admitting: Physician Assistant

## 2023-08-11 NOTE — Progress Notes (Incomplete)
Psychotherapy Progress Note [facility] Marliss Czar, PhD LP  Patient ID: [facility] "Douglas Price")    MRN: [id number] Therapy format: Individual psychotherapy Date: 07/26/2023      Start: 4:25p     Stop: 5:12p     Time Spent: 47 min Location: In-person   Session narrative (presenting needs, interim history, self-report of stressors and symptoms, applications of prior therapy, status changes, and interventions made in session) Saw Douglas Price for combination birthday.  Thankful for them bringing a supply of frozen dinners and doing a lot of cleaning, repairs, and landscape maintenance.  Decided not to bring up Douglas Price, didn't want to risk the atmosphere.  Granted prerogative, encouraged to get clear whether he means to try to fund it all himself (very unlikely) or get ahead of next years's needs before hot weather repeats.  Still has not closed the loop with neurologist about doing the biopsy, though Douglas Price did volunteer the funding [phone]).  Confusion and assumptions about whether it will do any good -- reminded he was told clearly that it would help determine treatment which could slow, arrest, or delay progression of his disease if he wants that (yes).  Turns out he has tried to get in touch but he is unavailable for nearly all hours they could return the call.  With his permission messaged [facility] office to guide and advise.    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  Another who is 6th grade but pre-K in achievement (Douglas Price).  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  Manageable so far, but it entails more walking than usual, getting  some more fatigued through the week.  Shocked to have it revealed that his new colleague is a male married to another male on staff, but not inclined to act on it.  Encouraged to continue working together [name], focus on the work.  (As Douglas Price is closeted himself, but believes homosexuality to be morally wrong, his own internal conflict is enough to manage.)  Therapeutic modalities: Cognitive Behavioral Therapy, Solution-Oriented/Positive Psychology, Environmental manager, and Faith-sensitive  Mental Status/Observations:  Appearance:   Casual     Behavior:  Appropriate  Motor:  Rollator dependent  Speech/Language:   Clear and Coherent and Slow  Affect:  Appropriate  Mood:  dysthymic  Thought process:  normal and slowed  Thought content:    WNL  Sensory/Perceptual disturbances:    WNL  Orientation:  Fully oriented  Attention:  Good    Concentration:  Good  Memory:  Mild impairment vs. repression  Insight:    Fair  Judgment:   Fair  Impulse Control:  Good   Risk Assessment: Danger to Self: No Self-injurious Behavior: No Danger to Others: No Physical Aggression / Violence: No Duty to Warn: No Access to Firearms a concern: No  Assessment of progress:  stabilized  Diagnosis:   ICD-10-CM   1. Major depressive disorder, recurrent episode, moderate (HCC)  F33.1     2. Social anxiety disorder  F40.10     3. Spinal stenosis of lumbar region with neurogenic claudication  M48.062     4. Parkinson's disease without dyskinesia  or fluctuating manifestations (HCC)  G20.A1     5. Diabetic polyneuropathy associated with type 2 diabetes mellitus (HCC)  E11.42      Plan:  Parkinson's and other medical care -- Follow through on renewed commitment to allow testing, in hopes of informing care and buying himself more functional time with progressive disease.  Ongoing encouragement to check assumptions with all doctors, don't settle for cynicism or what feelings say.  Follow through with other  needed procedures.  Ensure good hydration.  If adding sedating medication of any kind, consider reducing Klonopin to compensate for fall risk. Financial concerns and family relations -- Continue to pay off debt faithfully and save for needed repairs.  Practically speaking, he will not be able to begin funding HVAC repair until next June and can not on his own pay for the work by the time it's needed.  May look into resident or patient assistance options.  Encourage thinking through how much of the cost he might finance himself, as a measure of good faith, when begging charity.  Discretion to re-approach siblings about financial assistance or at least clarify terms and reasons for refusal, on understanding that an honestly explained "no" is simultaneously more palatable, less depressogenic, and more functional for them  than assumptions, avoidance, and mindreading.  Use other strategies discussed to verify and address any spoken or unspoken family concerns about his honesty and financial competency, if they exist, including letting them know it's OK to ask questions and OK to express doubts if they have them.  Worst case, if no assistance agreed, approach power company and social services about possible resident assistance programs rather than assume he is out of luck altogether.  Notice and dispute any idealistic thinking that they should see and figure things out and offer without bing asked, and dispute unnecessarily cynical thinking about them not caring.  For all needs, practice being clear about requests, willing to ask how he's coming across, and grateful for help provided. Social isolation -- Enact plan to return to church in one way or another.  At discretion, reach out to his own church to establish a transportation helper or buddy to join him. Other recommendations/advice -- As may be noted above.  Continue to utilize previously learned skills ad lib. Medication compliance -- Maintain medication as  prescribed and work faithfully with relevant prescriber(s) if any changes are desired or seem indicated. Crisis service -- Aware of call list and work-in appts.  Call the clinic on-call service, 988/hotline, 911, or present to [facility] or ER if any life-threatening psychiatric crisis. Followup -- No follow-ups on file.  Next scheduled visit with me 08/19/2023.  Next scheduled in this office 08/09/2023.  Robley Fries, PhD Marliss Czar, PhD LP Clinical Psychologist, [facility] [address] [place], [place] [address] [phone]

## 2023-08-12 ENCOUNTER — Ambulatory Visit (INDEPENDENT_AMBULATORY_CARE_PROVIDER_SITE_OTHER): Payer: BC Managed Care – PPO | Admitting: Physician Assistant

## 2023-08-12 ENCOUNTER — Encounter: Payer: Self-pay | Admitting: Physician Assistant

## 2023-08-12 DIAGNOSIS — F331 Major depressive disorder, recurrent, moderate: Secondary | ICD-10-CM | POA: Diagnosis not present

## 2023-08-12 DIAGNOSIS — G20A1 Parkinson's disease without dyskinesia, without mention of fluctuations: Secondary | ICD-10-CM

## 2023-08-12 DIAGNOSIS — G4700 Insomnia, unspecified: Secondary | ICD-10-CM | POA: Diagnosis not present

## 2023-08-12 DIAGNOSIS — F4010 Social phobia, unspecified: Secondary | ICD-10-CM

## 2023-08-12 NOTE — Progress Notes (Unsigned)
[facility]  Patient ID: Douglas [name],  MRN: [id number]  PCP: Chilton Greathouse, MD  Date of Evaluation: 08/12/2023 Time spent:25 minutes  Chief Complaint:  Chief Complaint   Anxiety; Depression; Insomnia; Follow-up    HISTORY/CURRENT STATUS: HPI  For routine med check.  Douglas [name] states he is still depressed.  It is not affecting his work, he is not missing days but he would like to have more energy and motivation.  He feels "down" and does not want to do anything.  States he is tired of feeling like this.  ADLs are limited due to the PD.  He is still seeing Dr. Arbutus Leas for that.  He had improvement with depression on Abilify but of course we cannot prescribe that due to the Parkinson's.  Personal hygiene is normal.  Appetite is normal and weight is [name].  He does not cry easily.  No feelings of hopelessness.  Memory is [name].  No change in concentration.  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  All of the noise and triggers of the kids is bothersome at times.  He was not always like that.  He is not having panic attacks.  He takes the Klonopin as needed and it is helpful.  He sleeps well with the temazepam.  Denies suicidal or homicidal thoughts but states he is tired of living like this.  Patient denies increased energy with decreased need for sleep, increased talkativeness, racing thoughts, impulsivity or risky behaviors, increased spending, increased libido, grandiosity, increased irritability or anger, paranoia, or hallucinations.  Review of Systems  Constitutional: Negative.   HENT: Negative.    Eyes: Negative.   Respiratory: Negative.    Cardiovascular: Negative.   Gastrointestinal: Negative.   Genitourinary: Negative.   Musculoskeletal: Negative.   Skin: Negative.   Neurological:        Uses a rolader.  Has rhythmical tremor in hands bilaterally but much more prominent and right hand.  Endo/Heme/Allergies:  Negative.   Psychiatric/Behavioral:         See HPI   Individual Medical History/ Review of Systems: Changes? :No     Past medications for mental health diagnoses include: Cymbalta, Wellbutrin, Abilify, Klonopin, Xanax, Temazapam, mirtazapine  Allergies: Avelox [moxifloxacin], Quinolones, and Lexapro [escitalopram]  Current Medications:  Current Outpatient Medications:    clonazePAM (KLONOPIN) 0.5 MG tablet, Take 1 tablet (0.5 mg total) by mouth 2 (two) times daily as needed for anxiety., Disp: 60 tablet, Rfl: 1   DULoxetine (CYMBALTA) 60 MG capsule, Take 120 mg by mouth daily. , Disp: , Rfl:    dutasteride (AVODART) 0.5 MG capsule, Take 0.5 mg by mouth daily., Disp: , Rfl:    HYDROcodone-acetaminophen (NORCO/VICODIN) 5-325 MG tablet, , Disp: , Rfl:    HYDROcodone-acetaminophen (NORCO/VICODIN) 5-325 MG tablet, 1-2 tabs PO q6 hours prn pain, Disp: 15 tablet, Rfl: 0   lithium carbonate 150 MG capsule, Take 1 capsule (150 mg total) by mouth every evening., Disp: 30 capsule, Rfl: 1   loratadine (CLARITIN) 10 MG tablet, Take 10 mg by mouth daily as needed., Disp: , Rfl:    metFORMIN (GLUCOPHAGE) 500 MG tablet, Take 500 mg by mouth daily with breakfast., Disp: , Rfl:    mirtazapine (REMERON) 15 MG tablet, Take 15 mg by mouth at bedtime., Disp: , Rfl:    nebivolol (BYSTOLIC) 5 MG tablet, TK 1 T PO D, Disp: , Rfl:    omeprazole (PRILOSEC) 20 MG capsule, Take 20 mg  by mouth 2 (two) times daily before a meal. , Disp: , Rfl:    pregabalin (LYRICA) 150 MG capsule, Take 150 mg by mouth 2 (two) times daily., Disp: , Rfl:    tamsulosin (FLOMAX) 0.4 MG CAPS capsule, Take 0.4 mg by mouth daily., Disp: , Rfl:    temazepam (RESTORIL) 15 MG capsule, Take 1 capsule (15 mg total) by mouth at bedtime as needed for sleep., Disp: 30 capsule, Rfl: 1   buPROPion (WELLBUTRIN XL) 300 MG 24 hr tablet, TAKE 1 TABLET(300 MG) BY MOUTH DAILY, Disp: 90 tablet, Rfl: 0 Medication Side Effects: none  Family Medical/ Social  History: Changes? No  MENTAL HEALTH EXAM:  There were no vitals taken for this visit.There is no height or weight on file to calculate BMI.  General Appearance: Casual and Well Groomed  Eye Contact:  Good  Speech:  Clear and Coherent and Normal Rate  Volume:  Normal  Mood:  Depressed  Affect:  Depressed  Thought Process:  Goal Directed and Descriptions of Associations: Circumstantial  Orientation:  Full (Time, Place, and Person)  Thought Content: Logical   Suicidal Thoughts:  No  Homicidal Thoughts:  No  Memory:  WNL  Judgement:  Good  Insight:  Good  Psychomotor Activity:   walks with rolling walker, stoops forward, shuffling gate. Difficulty rising from seated position but able to use arms of chair to get up without assistance. RUE pill rolling tremor at rest and of tremor of forearm  Concentration:  Concentration: Good and Attention Span: Good  Recall:  Good  Fund of Knowledge: Good  Language: Good  Assets:  Communication Skills Desire for Improvement Housing Resilience Transportation  ADL's:  Impaired  Cognition: WNL  Prognosis:  Good   DIAGNOSES:    ICD-10-CM   1. Major depressive disorder, recurrent episode, moderate (HCC)  F33.1     2. Social anxiety disorder  F40.10     3. Insomnia, unspecified type  G47.00     4. Parkinson's disease without dyskinesia or fluctuating manifestations (HCC)  G20.A1      Receiving Psychotherapy: Yes  with Dr. Marliss Czar  RECOMMENDATIONS:  PDMP reviewed.  Temazepam given 06/11/2023.  Klonopin filled 08/10/2023.  Hydrocodone and Lyrica known to me. I provided 25 minutes of face to face time during this encounter, including time spent before and after the visit in records review, medical decision making, counseling pertinent to today's visit, and charting.   He and I discussed the possibility of adding lithium for the depression.  Increasing the Wellbutrin is not appropriate because it has been tried at 450 mg but worsened anxiety  and tremor.  Other options include increasing mirtazapine for antidepressant use, not just sleep which he is using it for now.  We could wean off Cymbalta and try an SSRI.  He has responded well to Cymbalta until recently though so I prefer not to change that if possible.  Adding lithium is the best choice in my opinion as it works quickly and even though he is not having overt suicidal thoughts I think it will help his feelings of not wanting to be here anymore.  I have discussed this with my supervising physician, Dr. Meredith Staggers, who states adding lithium is okay as it does not worsen the tremor of Parkinson's disease.  I will start out with a very low dose and we can increase if needed.  When using lithium as an adjunct for major depressive disorder we usually do not have to  increase the dose above 300 mg, but we will monitor closely.  We discussed possible side effects of decreased kidney function and thyroid.  His PCP at [facility] follows his labs, they were drawn recently although I am not able to see those results, Douglas [name] states everything was normal.  If he responds well to the lithium, I will need to get lab results from them or order a BMP and TSH.  No lithium level will be needed unless we go over 300 mg.  Benefits, risk and side effects were discussed.  He verbalizes understanding and wants to try the lithium.  This prescription was not sent in during the appointment since I wanted to discuss with Dr. Jennelle Human first.  I have sent it to [facility] on [address], and will ask one of our [facility] to call the patient to let him know.  Patient states it is fine to leave a detailed message on his voicemail as he is the only 1 who listens to his messages.  He should make an appointment in a month.  Approximately 18 minutes was spent concerning sleep hygiene and options for the depression.  No other medication changes will be made at this time.  Continue Wellbutrin XL  300 mg (higher increased  anxiety)         Continue Klonopin 0.5 mg, 1 po bid. (1 every morning and 1 around lunchtime as needed.) Continue Cymbalta 60 mg, 2 po every day.  Start lithium 150 mg, 1 p.o. q. evening. Continue Mirtazapine 15 mg, 1 at bedtime. Continue Lyrica 150 mg, 1 po bid. Continue Temazepam 15 mg, at bedtime.  Continue therapy with Dr. Marliss Czar. Return in 4 weeks.  [name], PA-C

## 2023-08-14 ENCOUNTER — Encounter: Payer: Self-pay | Admitting: Physician Assistant

## 2023-08-14 MED ORDER — LITHIUM CARBONATE 150 MG PO CAPS: 150.0000 mg | ORAL_CAPSULE | Freq: Every evening | ORAL | 1 refills | Status: DC

## 2023-08-19 ENCOUNTER — Ambulatory Visit (INDEPENDENT_AMBULATORY_CARE_PROVIDER_SITE_OTHER): Payer: Medicare Other | Admitting: Psychiatry

## 2023-08-19 DIAGNOSIS — M48062 Spinal stenosis, lumbar region with neurogenic claudication: Secondary | ICD-10-CM | POA: Diagnosis not present

## 2023-08-19 DIAGNOSIS — F4010 Social phobia, unspecified: Secondary | ICD-10-CM | POA: Diagnosis not present

## 2023-08-19 DIAGNOSIS — G20A1 Parkinson's disease without dyskinesia, without mention of fluctuations: Secondary | ICD-10-CM | POA: Diagnosis not present

## 2023-08-19 DIAGNOSIS — F331 Major depressive disorder, recurrent, moderate: Secondary | ICD-10-CM

## 2023-08-19 NOTE — Progress Notes (Signed)
 Psychotherapy Progress Note [facility] Delora Ferry, PhD LP  Patient ID: Douglas Price [facility] "[name]")    MRN: [id number] Therapy format: Individual psychotherapy Date: 08/19/2023      Start: 4:11p     Stop: 5:00p     Time Spent: 49 min Location: In-person   Session narrative (presenting needs, interim history, self-report of stressors and symptoms, applications of prior therapy, status changes, and interventions made in session) Having difficulty with a coworker, diametrically opposed politically concerning the president-elect.  Today, she overtly tried to record his answer to a probe\ing question, which he found rude and balked at.  Although shaken initially, he had the wherewithal to state factually that she didn't have his (legal) consent to record (or to publish, if that was her plan).  Still upset and rattled by the incident, though he did prevail about his rights.  Concerned what to do, considering taking it to the principal.  Clarified values and recommended no, don't just go to the principal about it but wait and see if whatever recording might have been made gets posted and he takes flak, then report that as clearly out of bounds.  For now, content himself with the fact that he adequately and aptly represented his own objection and she appears to have listened.  Brother coming in this weekend.  Will be first TG in about 15 yrs that he is not alone.  Affirmed and encouraged, including considering bringing back up the financial aid conversation toward his A/C repairs, which will certainly be needed around 6 months from now.  Medically, has had lithium  added, recently.  Question what else he can do to speed up the process rising from depression.  Affirmed nutrition and sleep, securing help and socialization opportunities, and following through on better assessing and treating his Parkinson's.  Re. neurology, turns out he did get a call from Dr Douglas Price assistant but still  couldn't return the call for being occupied at work, and then more time passed without enough renewed effort.  Did leave a message offering several times that he could do the biopsy, but not clear he left a clear enough message to go on, and now it's been a month.  Considering either biting the bullet and calling during his short (30-min) lunch break, or waiting until January, after he has paid his property tax.  Encouraged to go ahead and call to engage and clarify -- that costs nothing, and he has no obligation to settle other debts before getting information and advancing the plan for treatment, at least.  Discussed life after retirement, whenever his disease forces the issue.  Encouraged to ask siblings when he sees them about the potential for him to move near one of them, eventually, so he can go forward with less wondering and assuming how they feel (not to mention set a disarming tone for it being safe to speak frankly about difficult things).  Therapeutic modalities: Cognitive Behavioral Therapy, Solution-Oriented/Positive Psychology, Ego-Supportive, and Assertiveness/Communication  Mental Status/Observations:  Appearance:   Casual     Behavior:  Appropriate  Motor:  Rollator, tremors  Speech/Language:   Clear and Coherent and Slow  Affect:  Appropriate  Mood:  dysthymic  Thought process:  normal  Thought content:    Rumination  Sensory/Perceptual disturbances:    WNL  Orientation:  Fully oriented  Attention:  Good    Concentration:  Good  Memory:  grossly intact  Insight:    Fair  Judgment:   Good  Impulse Control:  Good   Risk Assessment: Danger to Self: No Self-injurious Behavior: No Danger to Others: No Physical Aggression / Violence: No Duty to Warn: No Access to Firearms a concern: No  Assessment of progress:  stabilized  Diagnosis:   ICD-10-CM   1. Major depressive disorder, recurrent episode, moderate (HCC)  F33.1     2. Social anxiety disorder  F40.10     3.  Parkinson's disease without dyskinesia or fluctuating manifestations (HCC)  G20.A1     4. Spinal stenosis of lumbar region with neurogenic claudication  M48.062      Plan:  Parkinson's and other medical care -- Follow through on renewed commitment to allow testing, in hopes of informing care and buying himself more functional time with progressive disease.  Ongoing encouragement to check assumptions with all doctors, don't settle for cynicism or what feelings say.  Follow through with other needed procedures.  Ensure good hydration.  If adding sedating medication of any kind, consider reducing Klonopin  to compensate for fall risk. Financial concerns and family relations -- Continue to pay off debt faithfully and save for needed repairs.  Practically speaking, he will not be able to begin funding HVAC repair until next June and can not on his own pay for the work by the time it's needed.  May look into resident or patient assistance options.  Encourage thinking through how much of the cost he might finance himself, as a measure of good faith, when begging charity.  Discretion to re-approach siblings about financial assistance or at least clarify terms and reasons for refusal, on understanding that an honestly explained "no" is simultaneously more palatable, less depressogenic, and more functional for them  than assumptions, avoidance, and mindreading.  Use other strategies discussed to verify and address any spoken or unspoken family concerns about his honesty and financial competency, if they exist, including letting them know it's OK to ask questions and OK to express doubts if they have them.  Worst case, if no assistance agreed, approach power company and social services about possible resident assistance programs rather than assume he is out of luck altogether.  Notice and dispute any idealistic thinking that they should see and figure things out and offer without bing asked, and dispute unnecessarily  cynical thinking about them not caring.  For all needs, practice being clear about requests, willing to ask how he's coming across, and grateful for help provided. Social isolation -- Enact plan to return to church in one way or another.  At discretion, reach out to his own church to establish a transportation helper or buddy to join him. Other recommendations/advice -- As may be noted above.  Continue to utilize previously learned skills ad lib. Medication compliance -- Maintain medication as prescribed and work faithfully with relevant prescriber(s) if any changes are desired or seem indicated. Crisis service -- Aware of call list and work-in appts.  Call the clinic on-call service, 988/hotline, 911, or present to [facility] or ER if any life-threatening psychiatric crisis. Followup -- Return for time as already scheduled.  Next scheduled visit with me 09/03/2023.  Next scheduled in this office 09/03/2023.  Maretta Shaper, PhD Delora Ferry, PhD LP Clinical Psychologist, [facility] [address] [place], [place] [address] [phone]

## 2023-09-03 ENCOUNTER — Ambulatory Visit (INDEPENDENT_AMBULATORY_CARE_PROVIDER_SITE_OTHER): Payer: Medicare Other | Admitting: Psychiatry

## 2023-09-03 DIAGNOSIS — F331 Major depressive disorder, recurrent, moderate: Secondary | ICD-10-CM | POA: Diagnosis not present

## 2023-09-03 DIAGNOSIS — F4010 Social phobia, unspecified: Secondary | ICD-10-CM | POA: Diagnosis not present

## 2023-09-03 DIAGNOSIS — G20A1 Parkinson's disease without dyskinesia, without mention of fluctuations: Secondary | ICD-10-CM

## 2023-09-03 DIAGNOSIS — M48062 Spinal stenosis, lumbar region with neurogenic claudication: Secondary | ICD-10-CM

## 2023-09-03 NOTE — Progress Notes (Signed)
 Psychotherapy Progress Note [facility] Douglas Ferry, PhD LP  Patient ID: Douglas Mc [facility] "Douglas Price")    MRN: [id number] Therapy format: Individual psychotherapy Date: 09/03/2023      Start: 4:18p     Stop: 5:07p     Time Spent: 49 min Location: In-person   Session narrative (presenting needs, interim history, self-report of stressors and symptoms, applications of prior therapy, status changes, and interventions made in session) Eventful week -- Sunday afternoon fell in the shower then slipped again and hit arm on the commode.  Day off yesterday, went back to work today.  Did not seek urgent care, no contusions nor signs of concussion.  Bothered by increasing signs of disability, like a few times when legs are not responding well enough trying to rise from sitting.  Noticing more drooling and jaw tremor, too.  Has determined he will do the recommended biopsy after all, in January, having seen the light, finally, what it's worth to his health.  At the same time, admits he has not cashed Douglas Price's checks out of the irrational idea that it's a hardship for Douglas Price to give financial help.  Supportively confronted doublemindedness and urged to go ahead and use the money, remembering it was given freely and thoughtfully, and the right thing to do is actually to let it work for the good intended -- his health and welfare.  Even as discussed, Pt began again to mistake the idea that a biopsy might only give a "prognosis" rather than guidance how to treat his condition, and if all it says is how much time he has left, that would be worse than not knowing; confronted again how even if the results only tell him what to see coming with his degenerative illness, at least it helps make it clearer what to do for quality of life and adjustment, instead of setting him up to run into risks and accidents by surprise.  Seems to understand the point.  Has been thinking since last time, actually, about  starting the conversation around future living arrangements, e.g., whether it will be OK with siblings to move nearby somehow.  Admittedly has not brought that up yet, willing to do so when they visit for Christmas.    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  Therapeutic modalities: Cognitive Behavioral Therapy, Solution-Oriented/Positive Psychology, and Ego-Supportive  Mental Status/Observations:  Appearance:   Casual     Behavior:  Rationalizing  Motor:  Rollator, tremor  Speech/Language:   Slow  Affect:  Constricted  Mood:  depressed  Thought process:  normal  Thought content:    Rumination  Sensory/Perceptual disturbances:    WNL  Orientation:  Fully oriented  Attention:  Good    Concentration:  Good  Memory:  grossly intact  Insight:    Fair  Judgment:   Fair  Impulse Control:  Good   Risk Assessment: Danger to Self: No Self-injurious Behavior: No Danger to Others: No Physical Aggression / Violence: No Duty to Warn: No Access to Firearms a concern: No  Assessment of progress:  stabilized  Diagnosis:   ICD-10-CM   1. Major depressive disorder, recurrent episode, moderate (HCC)  F33.1     2. Social anxiety disorder  F40.10     3. Parkinson's disease without dyskinesia or fluctuating manifestations (HCC)  G20.A1     4. Spinal stenosis of lumbar region with neurogenic  claudication  M48.062      Plan:  Parkinson's and other health care -- Follow through on renewed commitment to allow testing, in hopes of informing care and buying himself more functional time with progressive disease.  Ongoing encouragement to check assumptions with all doctors, don't settle for cynicism or what feelings say is not worth the effort or expense and follow through with other needed procedures as they arise.  Ensure good hydration.  If adding sedating medication  of any kind, consider reducing Klonopin  or checking blood pressure regime to compensate for fall risk. Financial concerns and family relations -- Continue to pay off debt faithfully and save for needed repairs to climate control in the home.  Practically speaking, he will not be able to begin funding HVAC repair until next June and can not on his own pay for the work by the time it's needed.  May look into resident or patient assistance options to help make ends meet and make headway on finances.  Encourage thinking through how much of the cost he might finance himself, as a measure of good faith, when approaching family or public charity.  Discretion to re-approach siblings about financial assistance or at least clarify terms and their reasons for refusing so far, on the understanding that an honestly explained "no" is simultaneously more palatable, less depressogenic, more functional for them, and more respectful to everyone involved than is making assumptions, avoidance, and mindreading.  Use other strategies discussed to verify and address any spoken or unspoken family concerns which he understands to be about his honesty and financial competency; include letting them know it's OK to ask questions and OK to express doubts if they have them.  Worst case, if no assistance is agreed, then approach utilities and social services about possible resident assistance programs rather than assume he is out of luck altogether.  Notice and dispute any idealistic thinking that they should just see and figure things out and make offer without being asked, and dispute any unnecessarily cynical thinking about them not caring.  For all needs, practice being clear and direct as possible about requests, including willing to ask how he's coming across, and appropriately grateful for any help provided. Social isolation -- Enact plan to return to church in one way or another.  At discretion, reach out to his own church to establish  a transportation helper or buddy to join him. Other recommendations/advice -- As may be noted above.  Continue to utilize previously learned skills ad lib. Medication compliance -- Maintain medication as prescribed and work faithfully with relevant prescriber(s) if any changes are desired or seem indicated. Crisis service -- Aware of call list and work-in appts.  Call the clinic on-call service, 988/hotline, 911, or present to [facility] or ER if any life-threatening psychiatric crisis. Followup -- Return for time as already scheduled, avail earlier @ PT's need.  Next scheduled visit with me 10/15/2023.  Next scheduled in this office 09/30/2023.  Maretta Shaper, PhD Douglas Ferry, PhD LP Clinical Psychologist, [facility] [address], [address] [place], [place] [address] [phone]

## 2023-09-12 ENCOUNTER — Ambulatory Visit (INDEPENDENT_AMBULATORY_CARE_PROVIDER_SITE_OTHER): Payer: Medicare Other | Admitting: Psychiatry

## 2023-09-12 DIAGNOSIS — F4010 Social phobia, unspecified: Secondary | ICD-10-CM

## 2023-09-12 DIAGNOSIS — E1142 Type 2 diabetes mellitus with diabetic polyneuropathy: Secondary | ICD-10-CM

## 2023-09-12 DIAGNOSIS — Z9189 Other specified personal risk factors, not elsewhere classified: Secondary | ICD-10-CM

## 2023-09-12 DIAGNOSIS — F331 Major depressive disorder, recurrent, moderate: Secondary | ICD-10-CM

## 2023-09-12 DIAGNOSIS — G20A1 Parkinson's disease without dyskinesia, without mention of fluctuations: Secondary | ICD-10-CM | POA: Diagnosis not present

## 2023-09-12 DIAGNOSIS — M48062 Spinal stenosis, lumbar region with neurogenic claudication: Secondary | ICD-10-CM | POA: Diagnosis not present

## 2023-09-12 NOTE — Progress Notes (Signed)
 Psychotherapy Progress Note [facility] Delora Ferry, PhD LP  Patient ID: Douglas Price [facility] "Douglas Price")    MRN: [id number] Therapy format: Individual psychotherapy Date: 09/12/2023      Start: 4:12p     Stop: 5:00p     Time Spent: 48 min Location: In-person   Session narrative (presenting needs, interim history, self-report of stressors and symptoms, applications of prior therapy, status changes, and interventions made in session) Lost his wallet recently.  Debit card got separated, was in his pocket, ostensibly because he pulled the card out for the vending machine and lost track of the wallet.  Errands to run getting his NCDL and Medicare card replaced.  Extra pay deposited, which will use to settle his property tax bill.  Did deposit [name]'s checks, as encouraged, but unfortunately had a substantial plumbing bill to put the money toward, delaying progress saving a share for HVAC.  Encouraged again to have the necessary conversations about family help, how much, what terms, and whether to go to public agencies as noted.  Still plans to approach neurology about going ahead with the diagnostic biopsy.  Says he has tried calling at lunchtime but can't reach anyone.  Describes a standard hold message, so suspicion he may not be waiting long enough or properly understanding phone procedure nor leaving a message.  Encouraged attend to that and leave word if unsure.  Siblings coming in as planned 12/27-12/30.  Still intends to ask them about their blessing to move closer to one of them when health begins to show the need and it's time to relinquish his job.  Therapeutic modalities: Cognitive Behavioral Therapy, Solution-Oriented/Positive Psychology, and Ego-Supportive  Mental Status/Observations:  Appearance:   Casual, showing abrasions on legs    Behavior:  Appropriate  Motor:  Parkinsonian sxs -- gait, tremor, drooling  Speech/Language:   Clear and Coherent and Slow   Affect:  Appropriate  Mood:  anxious  Thought process:  normal  Thought content:    Rumination  Sensory/Perceptual disturbances:    WNL  Orientation:  Fully oriented  Attention:  Good    Concentration:  Good  Memory:  WNL  Insight:    Good  Judgment:   Variable  Impulse Control:  Good   Risk Assessment: Danger to Self: No Self-injurious Behavior: No Danger to Others: No Physical Aggression / Violence: No Duty to Warn: No Access to Firearms a concern: No  Assessment of progress:  stabilized  Diagnosis:   ICD-10-CM   1. Major depressive disorder, recurrent episode, moderate (HCC)  F33.1     2. Social anxiety disorder  F40.10     3. Parkinson's disease without dyskinesia or fluctuating manifestations (HCC)  G20.A1     4. 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 Diabetic polyneuropathy associated with type 2 diabetes mellitus (HCC)  E11.42     6. Lack of air conditioning in home environment  Z91.89      Plan:  Parkinson's and other health care -- Follow through on renewed commitment to allow testing, in hopes of informing care and buying himself more functional time with progressive disease.  Ongoing encouragement to check assumptions with all doctors, don't settle for cynicism or what feelings say is not worth the effort or expense and follow through with other needed procedures as they arise.  Ensure good hydration.  If adding sedating medication of any kind, consider reducing Klonopin  or checking blood pressure regime to compensate  for fall risk. Financial concerns and family relations -- Continue to pay off debt faithfully and save for needed repairs to climate control in the home.  Practically speaking, he will not be able to begin funding HVAC repair until next June and can not on his own pay for the work by the time it's needed.  May look into resident or patient assistance options to help make ends meet and make headway on finances.   Encourage thinking through how much of the cost he might finance himself, as a measure of good faith, when approaching family or public charity.  Discretion to re-approach siblings about financial assistance or at least clarify terms and their reasons for refusing so far, on the understanding that an honestly explained "no" is simultaneously more palatable, less depressogenic, more functional for them, and more respectful to everyone involved than is making assumptions, avoidance, and mindreading.  Use other strategies discussed to verify and address any spoken or unspoken family concerns which he understands to be about his honesty and financial competency; include letting them know it's OK to ask questions and OK to express doubts if they have them.  Worst case, if no assistance is agreed, then approach utilities and social services about possible resident assistance programs rather than assume he is out of luck altogether.  Notice and dispute any idealistic thinking that they should just see and figure things out and make offer without being asked, and dispute any unnecessarily cynical thinking about them not caring.  For all needs, practice being clear and direct as possible about requests, including willing to ask how he's coming across, and appropriately grateful for any help provided. Social isolation -- Enact plan to return to church in one way or another.  At discretion, reach out to his own church to establish a transportation helper or buddy to join him. Other recommendations/advice -- As may be noted above.  Continue to utilize previously learned skills ad lib. Medication compliance -- Maintain medication as prescribed and work faithfully with relevant prescriber(s) if any changes are desired or seem indicated. Crisis service -- Aware of call list and work-in appts.  Call the clinic on-call service, 988/hotline, 911, or present to [facility] or ER if any life-threatening psychiatric crisis. Followup --  Return for time as already scheduled, avail earlier @ PT's need.  Next scheduled visit with me 10/15/2023.  Next scheduled in this office 09/30/2023.  Maretta Shaper, PhD Delora Ferry, PhD LP Clinical Psychologist, [facility] [address] [place], [place] [address] [phone]

## 2023-09-16 ENCOUNTER — Other Ambulatory Visit: Payer: Self-pay | Admitting: Physician Assistant

## 2023-09-16 NOTE — Telephone Encounter (Signed)
Please send for Douglas Price; LV 11/18; LF 11/19 NV 1/06

## 2023-09-30 ENCOUNTER — Ambulatory Visit: Payer: Medicare Other | Admitting: Physician Assistant

## 2023-10-13 ENCOUNTER — Other Ambulatory Visit: Payer: Self-pay | Admitting: Physician Assistant

## 2023-10-15 ENCOUNTER — Ambulatory Visit: Payer: Medicare Other | Admitting: Psychiatry

## 2023-10-15 NOTE — Progress Notes (Signed)
Admin note for non-service contact  Patient ID: Douglas Price  MRN: [id number] DATE: 10/15/2023  Pt not seen but calendar entry mysteriously showed "Present" and "Signed" at 40 minutes in.  Courtesy call made to Pt, no indication why the system note, but he was not quite aware of the appt today, just had a feeling and was going to call this morning to check, but distracted by his car being in the shop with an expensive repair and needing a ride from a friend to/from school.  Compassionate relief, no charge made, reaffirmed next scheduled appt.  Robley Fries, PhD Marliss Czar, PhD LP Clinical Psychologist, [facility] [address] [place], [place] [address] [phone]

## 2023-10-15 NOTE — Progress Notes (Deleted)
Psychotherapy Progress Note [facility] Marliss Czar, PhD LP  Patient ID: Harle Stanford [facility] "[name]")    MRN: [id number] Therapy format: {Therapy Types:21967::"Individual psychotherapy"} Date: 10/15/2023      Start: ***:***     Stop: ***:***     Time Spent: *** min Location: {SvcLoc:22530::"In-person"}   Session narrative (presenting needs, interim history, self-report of stressors and symptoms, applications of prior therapy, status changes, and interventions made in session) ***  Therapeutic modalities: {AM:23362::"Cognitive Behavioral Therapy","Solution-Oriented/Positive Psychology"}  Mental Status/Observations:  Appearance:   {PSY:22683}     Behavior:  {PSY:21022743}  Motor:  {PSY:22302}  Speech/Language:   {PSY:22685}  Affect:  {PSY:22687}  Mood:  {PSY:31886}  Thought process:  {PSY:31888}  Thought content:    {PSY:[phone]}  Sensory/Perceptual disturbances:    {PSY:2601817184}  Orientation:  {Psych Orientation:23301::"Fully oriented"}  Attention:  {Good-Fair-Poor ratings:23770::"Good"}    Concentration:  {Good-Fair-Poor ratings:23770::"Good"}  Memory:  {PSY:929-849-5121}  Insight:    {Good-Fair-Poor ratings:23770::"Good"}  Judgment:   {Good-Fair-Poor ratings:23770::"Good"}  Impulse Control:  {Good-Fair-Poor ratings:23770::"Good"}   Risk Assessment: Danger to Self: {Risk:22599::"No"} Self-injurious Behavior: {Risk:22599::"No"} Danger to Others: {Risk:22599::"No"} Physical Aggression / Violence: {Risk:22599::"No"} Duty to Warn: {AMYesNo:22526::"No"} Access to Firearms a concern: {AMYesNo:22526::"No"}  Assessment of progress:  {Progress:22147::"progressing"}  Diagnosis: No diagnosis found. Plan:  *** Other recommendations/advice -- As may be noted above.  Continue to utilize previously learned skills ad lib. Medication compliance -- Maintain medication as prescribed and work faithfully with relevant prescriber(s) if any changes are desired or  seem indicated. Crisis service -- Aware of call list and work-in appts.  Call the clinic on-call service, 988/hotline, 911, or present to [facility] or ER if any life-threatening psychiatric crisis. Followup -- No follow-ups on file.  Next scheduled visit with me 11/14/2023.  Next scheduled in this office 11/14/2023.  Robley Fries, PhD Marliss Czar, PhD LP Clinical Psychologist, [facility] [address] [place], [place] [address] [phone]

## 2023-10-16 ENCOUNTER — Other Ambulatory Visit: Payer: Self-pay

## 2023-10-16 MED ORDER — TEMAZEPAM 15 MG PO CAPS: 15.0000 mg | ORAL_CAPSULE | Freq: Every evening | ORAL | 0 refills | Status: DC | PRN

## 2023-11-10 ENCOUNTER — Other Ambulatory Visit: Payer: Self-pay | Admitting: Physician Assistant

## 2023-11-14 ENCOUNTER — Ambulatory Visit: Payer: Medicare Other | Admitting: Psychiatry

## 2023-11-17 ENCOUNTER — Other Ambulatory Visit: Payer: Self-pay | Admitting: Physician Assistant

## 2023-11-21 ENCOUNTER — Ambulatory Visit (INDEPENDENT_AMBULATORY_CARE_PROVIDER_SITE_OTHER): Payer: Medicare Other | Admitting: Physician Assistant

## 2023-11-21 ENCOUNTER — Encounter: Payer: Self-pay | Admitting: Physician Assistant

## 2023-11-21 DIAGNOSIS — F3341 Major depressive disorder, recurrent, in partial remission: Secondary | ICD-10-CM | POA: Diagnosis not present

## 2023-11-21 DIAGNOSIS — G20A1 Parkinson's disease without dyskinesia, without mention of fluctuations: Secondary | ICD-10-CM | POA: Diagnosis not present

## 2023-11-21 DIAGNOSIS — G4700 Insomnia, unspecified: Secondary | ICD-10-CM | POA: Diagnosis not present

## 2023-11-21 DIAGNOSIS — F4010 Social phobia, unspecified: Secondary | ICD-10-CM

## 2023-11-21 MED ORDER — CLONAZEPAM 0.5 MG PO TABS: 0.5000 mg | ORAL_TABLET | Freq: Two times a day (BID) | ORAL | 2 refills | Status: DC | PRN

## 2023-11-21 MED ORDER — TEMAZEPAM 15 MG PO CAPS: 15.0000 mg | ORAL_CAPSULE | Freq: Every evening | ORAL | 2 refills | Status: DC | PRN

## 2023-11-21 NOTE — Progress Notes (Signed)
 [facility]  Patient ID: Douglas Price,  MRN: [id number]  PCP: Chilton Greathouse, MD  Date of Evaluation: 11/21/2023 Time spent:30 minutes  Chief Complaint:  Chief Complaint   Anxiety; Depression; Insomnia; Follow-up    HISTORY/CURRENT STATUS: HPI  For routine med check.  We decreased the Klonopin at the last visit but he still feels pretty anxious during the day.  He asked if we could increase back to twice daily dosing during the day as well as taking the temazepam at night.  He denies any falls.  The tremor from Parkinson's is about the same.  We added lithium several months ago to help with depression.  It has been effective.  He is not as "blue" as he had been.  Patient is able to enjoy things.  Energy and motivation are good.  Work is going well.   No extreme sadness, tearfulness, or feelings of hopelessness.  Sleeps well but has to take the temazepam.  If he does not take it he cannot fall asleep or stay asleep.  ADLs and personal hygiene are normal.   Denies any changes in memory.  Appetite has not changed.  Weight is stable.  Denies suicidal or homicidal thoughts.  Patient denies increased energy with decreased need for sleep, increased talkativeness, racing thoughts, impulsivity or risky behaviors, increased spending, increased libido, grandiosity, increased irritability or anger, paranoia, or hallucinations.  Review of Systems  Constitutional: Negative.   HENT: Negative.    Eyes: Negative.   Respiratory: Negative.    Cardiovascular: Negative.   Gastrointestinal: Negative.   Genitourinary: Negative.   Musculoskeletal: Negative.   Skin: Negative.   Neurological:  Positive for tremors.       See HPI  Endo/Heme/Allergies: Negative.   Psychiatric/Behavioral:         See HPI   Individual Medical History/ Review of Systems: Changes? :No     Past medications for mental health diagnoses include: Cymbalta, Wellbutrin, Abilify, Klonopin, Xanax, Temazapam,  mirtazapine  Allergies: Avelox [moxifloxacin], Quinolones, and Lexapro [escitalopram]  Current Medications:  Current Outpatient Medications:    buPROPion (WELLBUTRIN XL) 300 MG 24 hr tablet, TAKE 1 TABLET(300 MG) BY MOUTH DAILY, Disp: 30 tablet, Rfl: 0   DULoxetine (CYMBALTA) 60 MG capsule, Take 120 mg by mouth daily. , Disp: , Rfl:    dutasteride (AVODART) 0.5 MG capsule, Take 0.5 mg by mouth daily., Disp: , Rfl:    HYDROcodone-acetaminophen (NORCO/VICODIN) 5-325 MG tablet, , Disp: , Rfl:    HYDROcodone-acetaminophen (NORCO/VICODIN) 5-325 MG tablet, 1-2 tabs PO q6 hours prn pain, Disp: 15 tablet, Rfl: 0   lithium carbonate 150 MG capsule, TAKE 1 CAPSULE(150 MG) BY MOUTH EVERY EVENING, Disp: 30 capsule, Rfl: 1   loratadine (CLARITIN) 10 MG tablet, Take 10 mg by mouth daily as needed., Disp: , Rfl:    metFORMIN (GLUCOPHAGE) 500 MG tablet, Take 500 mg by mouth daily with breakfast., Disp: , Rfl:    mirtazapine (REMERON) 15 MG tablet, Take 15 mg by mouth at bedtime., Disp: , Rfl:    nebivolol (BYSTOLIC) 5 MG tablet, TK 1 T PO D, Disp: , Rfl:    omeprazole (PRILOSEC) 20 MG capsule, Take 20 mg by mouth 2 (two) times daily before a meal. , Disp: , Rfl:    pregabalin (LYRICA) 150 MG capsule, Take 150 mg by mouth 2 (two) times daily., Disp: , Rfl:    tamsulosin (FLOMAX) 0.4 MG CAPS capsule, Take 0.4 mg by mouth daily., Disp: , Rfl:  clonazePAM (KLONOPIN) 0.5 MG tablet, Take 1 tablet (0.5 mg total) by mouth 2 (two) times daily as needed for anxiety., Disp: 60 tablet, Rfl: 2   temazepam (RESTORIL) 15 MG capsule, Take 1 capsule (15 mg total) by mouth at bedtime as needed for sleep., Disp: 30 capsule, Rfl: 2 Medication Side Effects: none  Family Medical/ Social History: Changes? No  MENTAL HEALTH EXAM:  There were no vitals taken for this visit.There is no height or weight on file to calculate BMI.  General Appearance: Casual and Well Groomed  Eye Contact:  Good  Speech:  Clear and Coherent and  Normal Rate  Volume:  Normal  Mood:  Euthymic  Affect:  Congruent  Thought Process:  Goal Directed and Descriptions of Associations: Circumstantial  Orientation:  Full (Time, Place, and Person)  Thought Content: Logical   Suicidal Thoughts:  No  Homicidal Thoughts:  No  Memory:  WNL  Judgement:  Good  Insight:  Good  Psychomotor Activity:   walks with rolling walker, stoops forward, shuffling gate. Difficulty rising from seated position but able to use arms of chair to get up without assistance. RUE pill rolling tremor at rest and of tremor of forearm  Concentration:  Concentration: Good and Attention Span: Good  Recall:  Good  Fund of Knowledge: Good  Language: Good  Assets:  Communication Skills Desire for Improvement Housing Resilience Transportation  ADL's:  Impaired  Cognition: WNL  Prognosis:  Good   DIAGNOSES:    ICD-10-CM   1. Recurrent major depression in partial remission (HCC)  F33.41     2. Social anxiety disorder  F40.10     3. Insomnia, unspecified type  G47.00     4. Parkinson's disease without dyskinesia or fluctuating manifestations (HCC)  G20.A1       Receiving Psychotherapy: Yes  with Dr. Marliss Czar  RECOMMENDATIONS:  PDMP reviewed.  Temazepam given to 25 2025.  Klonopin filled 10/30/2023. I provided 30 minutes of face to face time during this encounter, including time spent before and after the visit in records review, medical decision making, counseling pertinent to today's visit, and charting.   We discussed the fact that he is on several sedating drugs, including Klonopin, temazepam, mirtazapine, hydrocodone.  It would be best if we could eliminate 1 or more of those medications, but neither of [place] see a way that could be done at this time.  He has been on Klonopin plus temazepam off and on for years and that is the only way he can sleep and get rid of the anxiety.  He understands the increased risk of falls from even just 1 benzodiazepine but because  he is on multiple sedating meds it is even greater.  I feel that sleep deprivation can also increase risk of confusion, imbalance and falls.  He is still working and he and I prefer him to have quality of life.  He accepts the risks of falling which could lead to other health problems such as fractures, surgery, rehab centers, and even death.  Continue Wellbutrin XL  300 mg (higher increased anxiety)         Continue Klonopin 0.5 mg, 1 po bid. (1 every morning and 1 around lunchtime as needed.) Continue Cymbalta 60 mg, 2 po every day.  Continue lithium 150 mg, 1 p.o. q. evening. Continue Mirtazapine 15 mg, 1 at bedtime. Continue Lyrica 150 mg, 1 po bid. Continue Temazepam 15 mg, at bedtime.  Continue therapy with Dr. Marliss Czar. Return  in 3 months.  [name], PA-C

## 2023-11-22 ENCOUNTER — Ambulatory Visit (INDEPENDENT_AMBULATORY_CARE_PROVIDER_SITE_OTHER): Payer: Medicare Other | Admitting: Psychiatry

## 2023-11-22 DIAGNOSIS — G20A1 Parkinson's disease without dyskinesia, without mention of fluctuations: Secondary | ICD-10-CM | POA: Diagnosis not present

## 2023-11-22 DIAGNOSIS — Z9189 Other specified personal risk factors, not elsewhere classified: Secondary | ICD-10-CM

## 2023-11-22 DIAGNOSIS — F4010 Social phobia, unspecified: Secondary | ICD-10-CM

## 2023-11-22 DIAGNOSIS — E1142 Type 2 diabetes mellitus with diabetic polyneuropathy: Secondary | ICD-10-CM

## 2023-11-22 DIAGNOSIS — M48062 Spinal stenosis, lumbar region with neurogenic claudication: Secondary | ICD-10-CM

## 2023-11-22 DIAGNOSIS — F331 Major depressive disorder, recurrent, moderate: Secondary | ICD-10-CM

## 2023-11-22 NOTE — Progress Notes (Signed)
 Psychotherapy Progress Note [facility] Douglas Ferry, PhD LP  Patient ID: Douglas Price [facility] "Douglas Price")    MRN: [id number] Therapy format: Individual psychotherapy Date: 11/22/2023      Start: 4:15p     Stop: 5:03p     Time Spent: 48 min Location: In-person   Session narrative (presenting needs, interim history, self-report of stressors and symptoms, applications of prior therapy, status changes, and interventions made in session) 5 wks since last seen.  $1300 car repair, and $600 plumber call mentioned last time, turns out it was for extraction of a squirrel that had stopped up the drains.  Apologetic about the miss last session.    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  Taken aback now by what appears to be a lack of memorializing him at school; supportively confronted jumping to cynical conclusions and encouraged in offering or asking about his concerns.  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  Siblings are coming up again soon.  Anticipates having to ask again for help repairing his nonfunctioning central air.  Says he has finished his debt consolidation, now starting on credit card debt.  Coached in approaching the issue matter of factly, and making the point if necessary that reliable climate control is health care adjacent for his condition.  Socially, friend Douglas Price invited to Christmas Eve service.  Well enjoyed, with dinner out.  Affirmed making the efforts to get connected and refresh his experience.  Re. Parkinson's, has been walking more in the job this year, which is taxing, but he has access to the elevator.  No  complaints of inability to function, no new falls since the one last year nor falling asleep with medication and/or BP issues.  Therapeutic modalities: Cognitive Behavioral Therapy, Solution-Oriented/Positive Psychology, and Ego-Supportive  Mental Status/Observations:  Appearance:   Casual     Behavior:  Appropriate  Motor:  Rollator, tremor  Speech/Language:   Clear and Coherent  Affect:  Appropriate  Mood:  anxious  Thought process:  normal  Thought content:    Rumination  Sensory/Perceptual disturbances:    WNL  Orientation:  Fully oriented  Attention:  Good    Concentration:  Good  Memory:  grossly intact  Insight:    Variable  Judgment:   Fair  Impulse Control:  Good   Risk Assessment: Danger to Self: No Self-injurious Behavior: No Danger to Others: No Physical Aggression / Violence: No Duty to Warn: No Access to Firearms a concern: No  Assessment of progress:  progressing  Diagnosis:   ICD-10-CM   1. Major depressive disorder, recurrent episode, moderate (HCC)  F33.1     2. Social anxiety disorder  F40.10     3. Parkinson's disease without dyskinesia or fluctuating manifestations (HCC)  G20.A1     4. 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 Diabetic polyneuropathy associated with type 2 diabetes mellitus (HCC)  E11.42     6. Lack of air conditioning in home environment  Z91.89      Plan:  Parkinson's and other health care -- Follow through on renewed commitment to allow testing, in hopes  of informing care and buying himself more functional time with progressive disease.  Ongoing encouragement to check assumptions with all doctors, don't settle for cynicism or what feelings say is not worth the effort or expense and follow through with other needed procedures as they arise.  Ensure good hydration.  If adding sedating medication of any kind, consider reducing Klonopin  or checking blood pressure regime to compensate for fall  risk. Financial concerns and family relations -- Continue to pay off debt faithfully and save for needed repairs to climate control in the home.  Practically speaking, he will not be able to begin funding HVAC repair until next June and can not on his own pay for the work by the time it's needed.  May look into resident or patient assistance options to help make ends meet and make headway on finances.  Encourage thinking through how much of the cost he might finance himself, as a measure of good faith, when approaching family or public charity.  Discretion to re-approach siblings about financial assistance or at least clarify terms and their reasons for refusing so far, on the understanding that an honestly explained "no" is simultaneously more palatable, less depressogenic, more functional for them, and more respectful to everyone involved than is making assumptions, avoidance, and mindreading.  Use other strategies discussed to verify and address any spoken or unspoken family concerns which he understands to be about his honesty and financial competency; include letting them know it's OK to ask questions and OK to express doubts if they have them.  Worst case, if no assistance is agreed, then approach utilities and social services about possible resident assistance programs rather than assume he is out of luck altogether.  Notice and dispute any idealistic thinking that they should just see and figure things out and make offer without being asked, and dispute any unnecessarily cynical thinking about them not caring.  For all needs, practice being clear and direct as possible about requests, including willing to ask how he's coming across, and appropriately grateful for any help provided. Social isolation -- Enact plan to return to church in one way or another.  At discretion, reach out to his own church to establish a transportation helper or buddy to join him. Other recommendations/advice -- As may be noted  above.  Continue to utilize previously learned skills ad lib. Medication compliance -- Maintain medication as prescribed and work faithfully with relevant prescriber(s) if any changes are desired or seem indicated. Crisis service -- Aware of call list and work-in appts.  Call the clinic on-call service, 988/hotline, 911, or present to [facility] or ER if any life-threatening psychiatric crisis. Followup -- Return for time as already scheduled.  Next scheduled visit with me 12/10/2023.  Next scheduled in this office 12/10/2023.  Maretta Shaper, PhD Douglas Ferry, PhD LP Clinical Psychologist, [facility] [address] [place], [place] [address] [phone]

## 2023-12-10 ENCOUNTER — Ambulatory Visit: Payer: Medicare Other | Admitting: Psychiatry

## 2023-12-14 ENCOUNTER — Other Ambulatory Visit: Payer: Self-pay | Admitting: Physician Assistant

## 2023-12-16 ENCOUNTER — Telehealth: Payer: Self-pay | Admitting: Neurology

## 2023-12-16 NOTE — Telephone Encounter (Signed)
 I got his skin biopsy schedule for June 13  I just wanted to make sure the ins will not need to have a new prior auth

## 2023-12-16 NOTE — Telephone Encounter (Signed)
 Sent email to [facility]

## 2023-12-23 ENCOUNTER — Telehealth: Payer: Self-pay | Admitting: Physician Assistant

## 2023-12-23 NOTE — Telephone Encounter (Signed)
 PA submitted to [employer] for Temazepam due to quantity, sometimes insurance only allows #15/month and patients will have to pay out of pocket for an additional #15. Pending response at this time

## 2023-12-23 NOTE — Telephone Encounter (Signed)
 Called pharmacy and they said there is a plan limitation. He is only able to get 15 temazepam. I don't see anything in [facility], but there is a note in Epic that PA was received in 9/24.

## 2023-12-23 NOTE — Telephone Encounter (Signed)
 Pt lvm that insurance will only give him 15 pills of his temazepam 15 mg. He wants [place] to do a PA or figure out how he can get 30 day supply. His number is [phone]

## 2023-12-24 NOTE — Telephone Encounter (Signed)
 Prior approval denied for request for more than #15 Temazepam 15 mg per month, his plan only covers #15. This medication is cheap, he can use [facility] to get #30 from $4.00-$12.00 or he can pay for just the #15 that he doesn't get through insurance.

## 2023-12-24 NOTE — Telephone Encounter (Signed)
 Noted.

## 2023-12-24 NOTE — Telephone Encounter (Signed)
 LVM to [facility]

## 2023-12-24 NOTE — Telephone Encounter (Signed)
 Patient returned call and provided the information to get #15 with insurance and pay #15 OOP. Cost with [facility] at [facility] is $7.03. Patient said that it is well worth it.

## 2024-01-17 ENCOUNTER — Ambulatory Visit (INDEPENDENT_AMBULATORY_CARE_PROVIDER_SITE_OTHER): Payer: Medicare Other | Admitting: Psychiatry

## 2024-01-17 DIAGNOSIS — F331 Major depressive disorder, recurrent, moderate: Secondary | ICD-10-CM

## 2024-01-17 DIAGNOSIS — E1142 Type 2 diabetes mellitus with diabetic polyneuropathy: Secondary | ICD-10-CM

## 2024-01-17 DIAGNOSIS — F4010 Social phobia, unspecified: Secondary | ICD-10-CM | POA: Diagnosis not present

## 2024-01-17 DIAGNOSIS — M48062 Spinal stenosis, lumbar region with neurogenic claudication: Secondary | ICD-10-CM

## 2024-01-17 DIAGNOSIS — G20A1 Parkinson's disease without dyskinesia, without mention of fluctuations: Secondary | ICD-10-CM

## 2024-01-17 NOTE — Progress Notes (Signed)
 Psychotherapy Progress Note [facility] Delora Ferry, PhD LP  Patient ID: Douglas Price [facility] "Douglas Price")    MRN: [id number] Therapy format: Individual psychotherapy Date: 01/17/2024      Start: 4:21p     Stop: 5:07p     Time Spent: 46 min Location: In-person   Session narrative (presenting needs, interim history, self-report of stressors and symptoms, applications of prior therapy, status changes, and interventions made in session) Incurred $2000 repair cost for car computer, then air conditioning stopped working, required a new compressor ($800).  With warming weather, the issue is returning of lacking air conditioning in his home, and the need for a subsidy to cover the work.    Has booked the biopsy procedure with Dr. Winferd Hatter -- made his call while home, scheduled June 13.  Is sold on the idea that it's the one way to tell what could help him delay or relieve Parkinson's.  Even interested in deep brain stimulation, if appropriate.  Briefly educated on what to expect of it, and how equipment would not show.   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  Aware of drooling, and mouth tremor, more observable by the students now.  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  Approved to return next year for his job, so long as he is medically capable.    Winnie Haver has been routinely helpful, seeing him every Saturday, helping with grocery pickup and small maintenance items.    Therapeutic modalities: Cognitive Behavioral Therapy, Solution-Oriented/Positive Psychology, and Ego-Supportive  Mental Status/Observations:  Appearance:   Casual     Behavior:  Appropriate  Motor:  Rollator, tremor  Speech/Language:   Clear and Coherent  Affect:  Appropriate  Mood:  anxious and dysthymic  Thought process:  normal  Thought content:    Rumination  Sensory/Perceptual disturbances:    WNL   Orientation:  Fully oriented  Attention:  Good    Concentration:  Good  Memory:  grossly intact  Insight:    Fair  Judgment:   Variable  Impulse Control:  Good   Risk Assessment: Danger to Self: No Self-injurious Behavior: No Danger to Others: No Physical Aggression / Violence: No Duty to Warn: No Access to Firearms a concern: No  Assessment of progress:  stabilized  Diagnosis:   ICD-10-CM   1. Major depressive disorder, recurrent episode, moderate (HCC)  F33.1     2. Social anxiety disorder  F40.10     3. Parkinson's disease without dyskinesia or fluctuating manifestations (HCC)  G20.A1     4. 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 Diabetic polyneuropathy associated with type 2 diabetes mellitus (HCC)  E11.42      Plan:  Parkinson's and other health care -- Follow through on renewed commitment to allow testing, in hopes of informing care and buying himself more functional time with progressive disease.  Ongoing encouragement to check assumptions with all doctors, don't settle for cynicism or what feelings say is not worth the effort or expense and follow through with other needed procedures as they arise.  Ensure good hydration.  If adding sedating medication of any kind, consider reducing Klonopin  or checking blood pressure regime to compensate for fall risk. Financial concerns and family relations -- Continue to pay off debt faithfully and save for needed repairs to climate control in the home.  Practically speaking, he  will not be able to begin funding HVAC repair until next June and can not on his own pay for the work by the time it's needed.  May look into resident or patient assistance options to help make ends meet and make headway on finances.  Encourage thinking through how much of the cost he might finance himself, as a measure of good faith, when approaching family or public charity.  Discretion to re-approach siblings about  financial assistance or at least clarify terms and their reasons for refusing so far, on the understanding that an honestly explained "no" is simultaneously more palatable, less depressogenic, more functional for them, and more respectful to everyone involved than is making assumptions, avoidance, and mindreading.  Use other strategies discussed to verify and address any spoken or unspoken family concerns which he understands to be about his honesty and financial competency; include letting them know it's OK to ask questions and OK to express doubts if they have them.  Worst case, if no assistance is agreed, then approach utilities and social services about possible resident assistance programs rather than assume he is out of luck altogether.  Notice and dispute any idealistic thinking that they should just see and figure things out and make offer without being asked, and dispute any unnecessarily cynical thinking about them not caring.  For all needs, practice being clear and direct as possible about requests, including willing to ask how he's coming across, and appropriately grateful for any help provided. Social isolation -- Enact plan to return to church in one way or another.  At discretion, reach out to his own church to establish a transportation helper or buddy to join him. Other recommendations/advice -- As may be noted above.  Continue to utilize previously learned skills ad lib. Medication compliance -- Maintain medication as prescribed and work faithfully with relevant prescriber(s) if any changes are desired or seem indicated. Crisis service -- Aware of call list and work-in appts.  Call the clinic on-call service, 988/hotline, 911, or present to [facility] or ER if any life-threatening psychiatric crisis. Followup -- Return for time as available.  Next scheduled visit with me Visit date not found.  Next scheduled in this office 02/21/2024.  Maretta Shaper, PhD Delora Ferry, PhD LP Clinical  Psychologist, [facility] [address], [address] [place], [place] [address] [phone]

## 2024-02-14 ENCOUNTER — Emergency Department (HOSPITAL_COMMUNITY)

## 2024-02-14 ENCOUNTER — Inpatient Hospital Stay (HOSPITAL_COMMUNITY)
Admission: EM | Admit: 2024-02-14 | Discharge: 2024-02-27 | DRG: 871 | Disposition: A | Discharge status: Skilled Nursing Facility | Attending: Internal Medicine | Admitting: Internal Medicine

## 2024-02-14 ENCOUNTER — Telehealth: Payer: Self-pay | Admitting: Psychiatry

## 2024-02-14 DIAGNOSIS — E860 Dehydration: Secondary | ICD-10-CM | POA: Diagnosis present

## 2024-02-14 DIAGNOSIS — R269 Unspecified abnormalities of gait and mobility: Secondary | ICD-10-CM | POA: Diagnosis not present

## 2024-02-14 DIAGNOSIS — R7401 Elevation of levels of liver transaminase levels: Secondary | ICD-10-CM | POA: Diagnosis not present

## 2024-02-14 DIAGNOSIS — G20A1 Parkinson's disease without dyskinesia, without mention of fluctuations: Secondary | ICD-10-CM | POA: Diagnosis present

## 2024-02-14 DIAGNOSIS — L89892 Pressure ulcer of other site, stage 2: Secondary | ICD-10-CM | POA: Diagnosis present

## 2024-02-14 DIAGNOSIS — N170 Acute kidney failure with tubular necrosis: Secondary | ICD-10-CM | POA: Diagnosis not present

## 2024-02-14 DIAGNOSIS — Z881 Allergy status to other antibiotic agents status: Secondary | ICD-10-CM

## 2024-02-14 DIAGNOSIS — H109 Unspecified conjunctivitis: Secondary | ICD-10-CM

## 2024-02-14 DIAGNOSIS — R6510 Systemic inflammatory response syndrome (SIRS) of non-infectious origin without acute organ dysfunction: Secondary | ICD-10-CM | POA: Diagnosis not present

## 2024-02-14 DIAGNOSIS — H1031 Unspecified acute conjunctivitis, right eye: Secondary | ICD-10-CM | POA: Diagnosis not present

## 2024-02-14 DIAGNOSIS — J301 Allergic rhinitis due to pollen: Secondary | ICD-10-CM | POA: Diagnosis present

## 2024-02-14 DIAGNOSIS — Z79899 Other long term (current) drug therapy: Secondary | ICD-10-CM

## 2024-02-14 DIAGNOSIS — R6520 Severe sepsis without septic shock: Secondary | ICD-10-CM | POA: Diagnosis not present

## 2024-02-14 DIAGNOSIS — Z7984 Long term (current) use of oral hypoglycemic drugs: Secondary | ICD-10-CM

## 2024-02-14 DIAGNOSIS — K219 Gastro-esophageal reflux disease without esophagitis: Secondary | ICD-10-CM | POA: Diagnosis present

## 2024-02-14 DIAGNOSIS — L0390 Cellulitis, unspecified: Secondary | ICD-10-CM | POA: Insufficient documentation

## 2024-02-14 DIAGNOSIS — Z888 Allergy status to other drugs, medicaments and biological substances status: Secondary | ICD-10-CM

## 2024-02-14 DIAGNOSIS — Z8249 Family history of ischemic heart disease and other diseases of the circulatory system: Secondary | ICD-10-CM

## 2024-02-14 DIAGNOSIS — I10 Essential (primary) hypertension: Secondary | ICD-10-CM | POA: Diagnosis present

## 2024-02-14 DIAGNOSIS — F17290 Nicotine dependence, other tobacco product, uncomplicated: Secondary | ICD-10-CM | POA: Diagnosis present

## 2024-02-14 DIAGNOSIS — N400 Enlarged prostate without lower urinary tract symptoms: Secondary | ICD-10-CM | POA: Diagnosis present

## 2024-02-14 DIAGNOSIS — L89226 Pressure-induced deep tissue damage of left hip: Secondary | ICD-10-CM | POA: Diagnosis present

## 2024-02-14 DIAGNOSIS — J690 Pneumonitis due to inhalation of food and vomit: Secondary | ICD-10-CM | POA: Diagnosis present

## 2024-02-14 DIAGNOSIS — W19XXXA Unspecified fall, initial encounter: Secondary | ICD-10-CM

## 2024-02-14 DIAGNOSIS — Z860100 Personal history of colon polyps, unspecified: Secondary | ICD-10-CM

## 2024-02-14 DIAGNOSIS — M6282 Rhabdomyolysis: Secondary | ICD-10-CM | POA: Diagnosis present

## 2024-02-14 DIAGNOSIS — R1310 Dysphagia, unspecified: Secondary | ICD-10-CM | POA: Diagnosis present

## 2024-02-14 DIAGNOSIS — E870 Hyperosmolality and hypernatremia: Secondary | ICD-10-CM

## 2024-02-14 DIAGNOSIS — F32A Depression, unspecified: Secondary | ICD-10-CM | POA: Diagnosis present

## 2024-02-14 DIAGNOSIS — R338 Other retention of urine: Secondary | ICD-10-CM

## 2024-02-14 DIAGNOSIS — A419 Sepsis, unspecified organism: Secondary | ICD-10-CM | POA: Diagnosis not present

## 2024-02-14 DIAGNOSIS — E876 Hypokalemia: Secondary | ICD-10-CM | POA: Insufficient documentation

## 2024-02-14 DIAGNOSIS — T148XXA Other injury of unspecified body region, initial encounter: Secondary | ICD-10-CM | POA: Diagnosis not present

## 2024-02-14 DIAGNOSIS — Z9841 Cataract extraction status, right eye: Secondary | ICD-10-CM

## 2024-02-14 DIAGNOSIS — G2581 Restless legs syndrome: Secondary | ICD-10-CM | POA: Diagnosis present

## 2024-02-14 DIAGNOSIS — L89896 Pressure-induced deep tissue damage of other site: Secondary | ICD-10-CM | POA: Diagnosis present

## 2024-02-14 DIAGNOSIS — L89112 Pressure ulcer of right upper back, stage 2: Secondary | ICD-10-CM | POA: Diagnosis present

## 2024-02-14 DIAGNOSIS — R509 Fever, unspecified: Principal | ICD-10-CM

## 2024-02-14 DIAGNOSIS — E871 Hypo-osmolality and hyponatremia: Secondary | ICD-10-CM | POA: Diagnosis not present

## 2024-02-14 DIAGNOSIS — R197 Diarrhea, unspecified: Secondary | ICD-10-CM | POA: Diagnosis not present

## 2024-02-14 DIAGNOSIS — G20C Parkinsonism, unspecified: Secondary | ICD-10-CM | POA: Diagnosis not present

## 2024-02-14 DIAGNOSIS — E1142 Type 2 diabetes mellitus with diabetic polyneuropathy: Secondary | ICD-10-CM | POA: Diagnosis present

## 2024-02-14 DIAGNOSIS — S0083XA Contusion of other part of head, initial encounter: Secondary | ICD-10-CM | POA: Diagnosis present

## 2024-02-14 DIAGNOSIS — G4700 Insomnia, unspecified: Secondary | ICD-10-CM | POA: Diagnosis present

## 2024-02-14 DIAGNOSIS — Z9842 Cataract extraction status, left eye: Secondary | ICD-10-CM

## 2024-02-14 DIAGNOSIS — Y92009 Unspecified place in unspecified non-institutional (private) residence as the place of occurrence of the external cause: Secondary | ICD-10-CM

## 2024-02-14 DIAGNOSIS — G709 Myoneural disorder, unspecified: Secondary | ICD-10-CM | POA: Diagnosis not present

## 2024-02-14 DIAGNOSIS — N401 Enlarged prostate with lower urinary tract symptoms: Secondary | ICD-10-CM | POA: Diagnosis not present

## 2024-02-14 DIAGNOSIS — R339 Retention of urine, unspecified: Secondary | ICD-10-CM | POA: Diagnosis not present

## 2024-02-14 DIAGNOSIS — N179 Acute kidney failure, unspecified: Secondary | ICD-10-CM | POA: Diagnosis present

## 2024-02-14 LAB — COMPREHENSIVE METABOLIC PANEL WITH GFR
ALT: 117 U/L — ABNORMAL HIGH (ref 0–44)
AST: 459 U/L — ABNORMAL HIGH (ref 15–41)
Albumin: 3.5 g/dL (ref 3.5–5.0)
Alkaline Phosphatase: 50 U/L (ref 38–126)
Anion gap: 13 (ref 5–15)
BUN: 38 mg/dL — ABNORMAL HIGH (ref 8–23)
CO2: 21 mmol/L — ABNORMAL LOW (ref 22–32)
Calcium: 8.6 mg/dL — ABNORMAL LOW (ref 8.9–10.3)
Chloride: 110 mmol/L (ref 98–111)
Creatinine, Ser: 1.56 mg/dL — ABNORMAL HIGH (ref 0.61–1.24)
GFR, Estimated: 47 mL/min — ABNORMAL LOW (ref >60)
Glucose, Bld: 186 mg/dL — ABNORMAL HIGH (ref 70–99)
Potassium: 3.7 mmol/L (ref 3.5–5.1)
Sodium: 144 mmol/L (ref 135–145)
Total Bilirubin: 2.1 mg/dL — ABNORMAL HIGH (ref 0.0–1.2)
Total Protein: 6.5 g/dL (ref 6.5–8.1)

## 2024-02-14 LAB — PROTIME-INR
INR: 1.4 — ABNORMAL HIGH (ref 0.8–1.2)
Prothrombin Time: 17.4 s — ABNORMAL HIGH (ref 11.4–15.2)

## 2024-02-14 LAB — CBC WITH DIFFERENTIAL/PLATELET
Abs Immature Granulocytes: 0.27 10*3/uL — ABNORMAL HIGH (ref 0.00–0.07)
Basophils Absolute: 0 10*3/uL (ref 0.0–0.1)
Basophils Relative: 0 %
Eosinophils Absolute: 0 10*3/uL (ref 0.0–0.5)
Eosinophils Relative: 0 %
HCT: 41.9 % (ref 39.0–52.0)
Hemoglobin: 14 g/dL (ref 13.0–17.0)
Immature Granulocytes: 1 %
Lymphocytes Relative: 3 %
Lymphs Abs: 0.6 10*3/uL — ABNORMAL LOW (ref 0.7–4.0)
MCH: 30.4 pg (ref 26.0–34.0)
MCHC: 33.4 g/dL (ref 30.0–36.0)
MCV: 91.1 fL (ref 80.0–100.0)
Monocytes Absolute: 1.1 10*3/uL — ABNORMAL HIGH (ref 0.1–1.0)
Monocytes Relative: 5 %
Neutro Abs: 18.4 10*3/uL — ABNORMAL HIGH (ref 1.7–7.7)
Neutrophils Relative %: 91 %
Platelets: 249 10*3/uL (ref 150–400)
RBC: 4.6 MIL/uL (ref 4.22–5.81)
RDW: 14.4 % (ref 11.5–15.5)
WBC: 20.4 10*3/uL — ABNORMAL HIGH (ref 4.0–10.5)
nRBC: 0 % (ref 0.0–0.2)

## 2024-02-14 LAB — URINALYSIS, W/ REFLEX TO CULTURE (INFECTION SUSPECTED)
Bilirubin Urine: NEGATIVE
Glucose, UA: NEGATIVE mg/dL
Ketones, ur: 5 mg/dL — AB
Leukocytes,Ua: NEGATIVE
Nitrite: NEGATIVE
Protein, ur: 100 mg/dL — AB
Specific Gravity, Urine: 1.021 (ref 1.005–1.030)
pH: 5 (ref 5.0–8.0)

## 2024-02-14 LAB — HEMOGLOBIN A1C
Hgb A1c MFr Bld: 5.6 % (ref 4.8–5.6)
Mean Plasma Glucose: 114.02 mg/dL

## 2024-02-14 LAB — GLUCOSE, CAPILLARY: Glucose-Capillary: 149 mg/dL — ABNORMAL HIGH (ref 70–99)

## 2024-02-14 LAB — I-STAT CG4 LACTIC ACID, ED
Lactic Acid, Venous: 1.7 mmol/L (ref 0.5–1.9)
Lactic Acid, Venous: 3.4 mmol/L (ref 0.5–1.9)

## 2024-02-14 LAB — C-REACTIVE PROTEIN: CRP: 8 mg/dL — ABNORMAL HIGH (ref <1.0)

## 2024-02-14 LAB — CBG MONITORING, ED: Glucose-Capillary: 180 mg/dL — ABNORMAL HIGH (ref 70–99)

## 2024-02-14 LAB — CK: Total CK: 19111 U/L — ABNORMAL HIGH (ref 49–397)

## 2024-02-14 LAB — LITHIUM LEVEL: Lithium Lvl: 0.11 mmol/L — ABNORMAL LOW (ref 0.60–1.20)

## 2024-02-14 LAB — SEDIMENTATION RATE: Sed Rate: 13 mm/h (ref 0–16)

## 2024-02-14 LAB — HIV ANTIBODY (ROUTINE TESTING W REFLEX): HIV Screen 4th Generation wRfx: NONREACTIVE

## 2024-02-14 MED ORDER — PREGABALIN 75 MG PO CAPS
150.0000 mg | ORAL_CAPSULE | Freq: Two times a day (BID) | ORAL | Status: DC
  Administered 2024-02-17: 150 mg via ORAL
  Filled 2024-02-14 (×3): qty 2

## 2024-02-14 MED ORDER — PANTOPRAZOLE SODIUM 40 MG PO TBEC
40.0000 mg | DELAYED_RELEASE_TABLET | Freq: Every day | ORAL | Status: DC
  Filled 2024-02-14: qty 1

## 2024-02-14 MED ORDER — ACETAMINOPHEN 325 MG PO TABS
650.0000 mg | ORAL_TABLET | Freq: Four times a day (QID) | ORAL | Status: DC | PRN
Start: 2024-02-14 — End: 2024-02-17

## 2024-02-14 MED ORDER — HEPARIN SODIUM (PORCINE) 5000 UNIT/ML IJ SOLN
5000.0000 [IU] | Freq: Three times a day (TID) | INTRAMUSCULAR | Status: DC
  Administered 2024-02-14 – 2024-02-27 (×39): 5000 [IU] via SUBCUTANEOUS
  Filled 2024-02-14 (×39): qty 1

## 2024-02-14 MED ORDER — INSULIN ASPART 100 UNIT/ML IJ SOLN: 0.0000 [IU] | Freq: Every day | INTRAMUSCULAR | Status: DC

## 2024-02-14 MED ORDER — INSULIN ASPART 100 UNIT/ML IJ SOLN
0.0000 [IU] | Freq: Three times a day (TID) | INTRAMUSCULAR | Status: DC
Start: 2024-02-14 — End: 2024-02-17
  Administered 2024-02-14 – 2024-02-16 (×5): 1 [IU] via SUBCUTANEOUS
  Administered 2024-02-16 (×2): 2 [IU] via SUBCUTANEOUS

## 2024-02-14 MED ORDER — LACTATED RINGERS IV SOLN
INTRAVENOUS | Status: AC
Start: 2024-02-14 — End: 2024-02-15

## 2024-02-14 MED ORDER — ACETAMINOPHEN 650 MG RE SUPP: 650.0000 mg | Freq: Four times a day (QID) | RECTAL | Status: DC | PRN

## 2024-02-14 MED ORDER — SODIUM CHLORIDE 0.9 % IV SOLN
2.0000 g | INTRAVENOUS | Status: DC
  Administered 2024-02-15: 2 g via INTRAVENOUS
  Filled 2024-02-14: qty 20

## 2024-02-14 MED ORDER — SODIUM CHLORIDE 0.9% FLUSH
3.0000 mL | Freq: Two times a day (BID) | INTRAVENOUS | Status: DC
Start: 2024-02-14 — End: 2024-02-27
  Administered 2024-02-14 – 2024-02-27 (×26): 3 mL via INTRAVENOUS

## 2024-02-14 MED ORDER — MIRTAZAPINE 15 MG PO TABS
15.0000 mg | ORAL_TABLET | Freq: Every day | ORAL | Status: DC
  Filled 2024-02-14 (×2): qty 1

## 2024-02-14 MED ORDER — LITHIUM CARBONATE 150 MG PO CAPS
150.0000 mg | ORAL_CAPSULE | Freq: Every day | ORAL | Status: DC
  Filled 2024-02-14 (×2): qty 1

## 2024-02-14 MED ORDER — ACETAMINOPHEN 325 MG PO TABS
975.0000 mg | ORAL_TABLET | Freq: Once | ORAL | Status: AC
Start: 2024-02-14 — End: 2024-02-14
  Administered 2024-02-14: 975 mg via ORAL
  Filled 2024-02-14: qty 3

## 2024-02-14 MED ORDER — DUTASTERIDE 0.5 MG PO CAPS
0.5000 mg | ORAL_CAPSULE | Freq: Every day | ORAL | Status: DC
  Administered 2024-02-20 – 2024-02-27 (×8): 0.5 mg via ORAL
  Filled 2024-02-14 (×13): qty 1

## 2024-02-14 MED ORDER — ACETAMINOPHEN 650 MG RE SUPP
650.0000 mg | Freq: Four times a day (QID) | RECTAL | Status: DC | PRN
  Administered 2024-02-15 – 2024-02-18 (×7): 650 mg via RECTAL
  Filled 2024-02-14 (×7): qty 1

## 2024-02-14 MED ORDER — DULOXETINE HCL 60 MG PO CPEP
120.0000 mg | ORAL_CAPSULE | Freq: Every day | ORAL | Status: DC
  Administered 2024-02-20 – 2024-02-27 (×8): 120 mg via ORAL
  Filled 2024-02-14 (×9): qty 2

## 2024-02-14 MED ORDER — GERHARDT'S BUTT CREAM
TOPICAL_CREAM | Freq: Two times a day (BID) | CUTANEOUS | Status: DC
  Administered 2024-02-14 – 2024-02-20 (×3): 1 via TOPICAL
  Filled 2024-02-14: qty 60

## 2024-02-14 MED ORDER — ALBUTEROL SULFATE (2.5 MG/3ML) 0.083% IN NEBU: 2.5000 mg | INHALATION_SOLUTION | Freq: Four times a day (QID) | RESPIRATORY_TRACT | Status: DC | PRN

## 2024-02-14 MED ORDER — ACETAMINOPHEN 325 MG PO TABS: 975.0000 mg | ORAL_TABLET | Freq: Four times a day (QID) | ORAL | Status: DC | PRN

## 2024-02-14 MED ORDER — MUPIROCIN 2 % EX OINT
TOPICAL_OINTMENT | Freq: Two times a day (BID) | CUTANEOUS | Status: DC
  Administered 2024-02-19 – 2024-02-20 (×2): 1 via TOPICAL
  Filled 2024-02-14 (×3): qty 22

## 2024-02-14 MED ORDER — ACETAMINOPHEN 325 MG PO TABS: 650.0000 mg | ORAL_TABLET | Freq: Four times a day (QID) | ORAL | Status: DC | PRN

## 2024-02-14 MED ORDER — LACTATED RINGERS IV BOLUS (SEPSIS)
1000.0000 mL | Freq: Once | INTRAVENOUS | Status: AC
  Administered 2024-02-14: 1000 mL via INTRAVENOUS

## 2024-02-14 MED ORDER — NEBIVOLOL HCL 2.5 MG PO TABS
2.5000 mg | ORAL_TABLET | Freq: Every day | ORAL | Status: DC
Start: 2024-02-14 — End: 2024-02-16
  Filled 2024-02-14 (×3): qty 1

## 2024-02-14 MED ORDER — HYDROCODONE-ACETAMINOPHEN 5-325 MG PO TABS
1.0000 | ORAL_TABLET | Freq: Four times a day (QID) | ORAL | Status: DC | PRN
Start: 2024-02-14 — End: 2024-02-16

## 2024-02-14 MED ORDER — TEMAZEPAM 15 MG PO CAPS: 15.0000 mg | ORAL_CAPSULE | Freq: Every evening | ORAL | Status: DC | PRN

## 2024-02-14 MED ORDER — TAMSULOSIN HCL 0.4 MG PO CAPS: 0.4000 mg | ORAL_CAPSULE | Freq: Every day | ORAL | Status: DC

## 2024-02-14 MED ORDER — BUPROPION HCL ER (XL) 150 MG PO TB24
300.0000 mg | ORAL_TABLET | Freq: Every day | ORAL | Status: DC
  Filled 2024-02-14: qty 2

## 2024-02-14 MED ORDER — SODIUM CHLORIDE 0.9 % IV SOLN
2.0000 g | Freq: Once | INTRAVENOUS | Status: AC
  Administered 2024-02-14: 2 g via INTRAVENOUS
  Filled 2024-02-14: qty 20

## 2024-02-14 NOTE — Telephone Encounter (Signed)
 Admin note for collateral contact  Patient ID: JEANPAUL BIEHL  MRN: [id number] DATE: 02/14/2024  Incoming call received from sister, Suezanne Emperor, ph. [phone], who reported Pt is in hospital (chart notes ED visit for fever and rhabdomyolysis).  Aware that she does not have consent in this office to be given information, indicated she wants to apprise treatment team of certain concerns.  RTC 5:25p, reached on the way into town from [place].  Glad to receive the call.  Reports he noshowed for work, school called in a welfare check, found him on the floor for as much as 2 days, large laceration, turned septic, now hospitalized.  Allowed to lay out concerns for his welfare, including a hoarding problem (e.g., > 100 Polo shirts he can't part with, claims of clutter and filth), and hardheaded insistence on getting $8500 to fix his air conditioning, when siblings' assessment of the home is that it would be throwing good money after bad in the condition the place is in.    Clarifying that I cannot divulge personal information, oriented to procedures and communication tactics which, in general, can help break through to get necessary care done for a stubborn loved one in similar condition, including informing the treatment team of family concerns for release back to his home and difficulty managing, involving social work (likely medical, possibly community) for a home assessment, and conferring with rehabilitation team about foreseeable difficulties managing in the home environment as it stands, all of which could introduce unknown services that can effectively intervene in a respectful way.  Re. overcoming resistance, offered standard tips like putting challenges in the form of questions, e.g., "Can you tell...", "How would you tell...", and "I know this is very uncomfortable to talk about.  Can we do it anyway?" while shying away from things that sound more like blunt "teaching".    Agreed open to family  counseling provided patient consent, and open to receive news of his condition and progress.  Pt presently scheduled with psychiatry next Friday 5/30, therapy 6/13, but will make accommodations if need to be seen sooner.  Attending physician apprised via [facility] chat, offered availability to consult by phone through the holiday weekend.  Maretta Shaper, PhD Delora Ferry, PhD LP Clinical Psychologist, [facility] [address], [address] [place], [place] [address] [phone]

## 2024-02-14 NOTE — ED Triage Notes (Signed)
 Pt BIB [facility], found on ground for unknown amount of time. Pt is ao to place only and cannot answer questions. Pt was found soaked in urine with multiple wounds to legs and torso. R eye is swollen and red. GCS 14. Hx Parkinsons.  EMS vitals: RR 30 HR 120 Temp 102.6 tympanic BP 188/98

## 2024-02-14 NOTE — Progress Notes (Signed)
 Patient arrived to 3W12 from ED. Safety precautions and orders reviewed with patient. CCMD called and confirmed. Peri care/bath provided. Images from injury PTA taken and inchart.

## 2024-02-14 NOTE — ED Provider Notes (Signed)
 [place] EMERGENCY DEPARTMENT AT [facility] Provider Note   CSN: [id number] Arrival date & time: 02/14/24  1122     History  Chief Complaint  Patient presents with   Code Sepsis    Douglas Price is a 73 y.o. male.  HPI   Patient has a history of diabetes restless leg syndrome Parkinson's disease, spinal stenosis, diverticulosis who presents to the ED for evaluation after being found on the floor.  Patient did not go to work for couple days and his coworkers became concerned.  They called for a welfare check.  Patient was found lying on the floor he was soaked in urine, he was noted to have several abrasions on his skin.  Patient states he fell a couple days ago.  He was feeling weak.  Patient had not been able to get up.  He denies any cough.  Patient states he did hit his head and face.  He denies any other focal injuries.  He was not aware that he was having fevers.  Home Medications Prior to Admission medications   Medication Sig Start Date End Date Taking? Authorizing Provider  buPROPion  (WELLBUTRIN  XL) 300 MG 24 hr tablet TAKE 1 TABLET(300 MG) BY MOUTH DAILY 12/15/23   Marvia Slocumb T, PA-C  clonazePAM  (KLONOPIN ) 0.5 MG tablet Take 1 tablet (0.5 mg total) by mouth 2 (two) times daily as needed for anxiety. 11/21/23   Verneda Golder, PA-C  DULoxetine  (CYMBALTA ) 60 MG capsule Take 120 mg by mouth daily.     [provider]  dutasteride  (AVODART ) 0.5 MG capsule Take 0.5 mg by mouth daily.    [provider]  HYDROcodone -acetaminophen  (NORCO/VICODIN) 5-325 MG tablet  12/24/11   [provider]  HYDROcodone -acetaminophen  (NORCO/VICODIN) 5-325 MG tablet 1-2 tabs PO q6 hours prn pain 04/09/22   Kuzma, Kevin, MD  lithium  carbonate 150 MG capsule TAKE 1 CAPSULE(150 MG) BY MOUTH EVERY EVENING 12/15/23   Hurst, Ammon Bales T, PA-C  loratadine (CLARITIN) 10 MG tablet Take 10 mg by mouth daily as needed.    [provider]  metFORMIN  (GLUCOPHAGE ) 500  MG tablet Take 500 mg by mouth daily with breakfast.    [provider]  mirtazapine (REMERON) 15 MG tablet Take 15 mg by mouth at bedtime.    [provider]  nebivolol (BYSTOLIC) 5 MG tablet TK 1 T PO D 11/04/17   [provider]  omeprazole (PRILOSEC) 20 MG capsule Take 20 mg by mouth 2 (two) times daily before a meal.     [provider]  pregabalin  (LYRICA ) 150 MG capsule Take 150 mg by mouth 2 (two) times daily.    [provider]  tamsulosin  (FLOMAX ) 0.4 MG CAPS capsule Take 0.4 mg by mouth daily.    [provider]  temazepam  (RESTORIL ) 15 MG capsule Take 1 capsule (15 mg total) by mouth at bedtime as needed for sleep. 11/21/23   Verneda Golder, PA-C      Allergies    Avelox [moxifloxacin], Quinolones, and Lexapro [escitalopram]    Review of Systems   Review of Systems  Physical Exam Updated Vital Signs BP (!) 159/84 (BP Location: Left Arm)   Pulse (!) 109   Temp 99.3 F (37.4 C) (Axillary)   Resp 19   SpO2 94%  Physical Exam Vitals and nursing note reviewed.  Constitutional:      Appearance: He is well-developed. He is ill-appearing.  HENT:     Head: Normocephalic and atraumatic.  Comments: Welling and bruising noted around the right eye, no hyphema or conjunctival hemorrhage bilaterally    Right Ear: External ear normal.     Left Ear: External ear normal.  Eyes:     General: No scleral icterus.       Right eye: No discharge.        Left eye: No discharge.     Conjunctiva/sclera: Conjunctivae normal.  Neck:     Trachea: No tracheal deviation.  Cardiovascular:     Rate and Rhythm: Normal rate and regular rhythm.  Pulmonary:     Effort: Pulmonary effort is normal. No respiratory distress.     Breath sounds: Normal breath sounds. No stridor. No wheezing or rales.  Abdominal:     General: Bowel sounds are normal. There is no distension.     Palpations: Abdomen is soft.     Tenderness: There is no abdominal  tenderness. There is no guarding or rebound.  Musculoskeletal:        General: No tenderness or deformity.     Cervical back: Neck supple.     Comments: No tenderness palpation the extremities bruising noted on the left hip, no spinal tenderness, c-collar in place  Skin:    General: Skin is warm and dry.     Comments: Superficial abrasions noted on the skin and extremities  Neurological:     General: No focal deficit present.     Mental Status: He is alert.     Cranial Nerves: No cranial nerve deficit, dysarthria or facial asymmetry.     Sensory: No sensory deficit.     Motor: Weakness and tremor present. No abnormal muscle tone or seizure activity.     Coordination: Coordination normal.     Comments: Tremor noted of the right upper extremity, patient able to move all extremities, no facial droop noted, answering questions  Psychiatric:        Mood and Affect: Mood normal.     ED Results / Procedures / Treatments   Labs (all labs ordered are listed, but only abnormal results are displayed) Labs Reviewed  COMPREHENSIVE METABOLIC PANEL WITH GFR - Abnormal; Notable for the following components:      Result Value   CO2 21 (*)    Glucose, Bld 186 (*)    BUN 38 (*)    Creatinine, Ser 1.56 (*)    Calcium 8.6 (*)    AST 459 (*)    ALT 117 (*)    Total Bilirubin 2.1 (*)    GFR, Estimated 47 (*)    All other components within normal limits  CBC WITH DIFFERENTIAL/PLATELET - Abnormal; Notable for the following components:   WBC 20.4 (*)    Neutro Abs 18.4 (*)    Lymphs Abs 0.6 (*)    Monocytes Absolute 1.1 (*)    Abs Immature Granulocytes 0.27 (*)    All other components within normal limits  URINALYSIS, W/ REFLEX TO CULTURE (INFECTION SUSPECTED) - Abnormal; Notable for the following components:   Color, Urine AMBER (*)    APPearance CLOUDY (*)    Hgb urine dipstick LARGE (*)    Ketones, ur 5 (*)    Protein, ur 100 (*)    Bacteria, UA RARE (*)    All other components within  normal limits  PROTIME-INR - Abnormal; Notable for the following components:   Prothrombin Time 17.4 (*)    INR 1.4 (*)    All other components within normal limits  CK - Abnormal;  Notable for the following components:   Total CK 19,111 (*)    All other components within normal limits  LITHIUM  LEVEL - Abnormal; Notable for the following components:   Lithium  Lvl 0.11 (*)    All other components within normal limits  C-REACTIVE PROTEIN - Abnormal; Notable for the following components:   CRP 8.0 (*)    All other components within normal limits  GLUCOSE, CAPILLARY - Abnormal; Notable for the following components:   Glucose-Capillary 149 (*)    All other components within normal limits  GLUCOSE, CAPILLARY - Abnormal; Notable for the following components:   Glucose-Capillary 147 (*)    All other components within normal limits  CBG MONITORING, ED - Abnormal; Notable for the following components:   Glucose-Capillary 180 (*)    All other components within normal limits  I-STAT CG4 LACTIC ACID, ED - Abnormal; Notable for the following components:   Lactic Acid, Venous 3.4 (*)    All other components within normal limits  CULTURE, BLOOD (ROUTINE X 2)  CULTURE, BLOOD (ROUTINE X 2)  MRSA NEXT GEN BY PCR, NASAL  RESP PANEL BY RT-PCR (RSV, FLU A&B, COVID)  RVPGX2  HIV ANTIBODY (ROUTINE TESTING W REFLEX)  SEDIMENTATION RATE  HEMOGLOBIN A1C  CBC  CK  COMPREHENSIVE METABOLIC PANEL WITH GFR  I-STAT CG4 LACTIC ACID, ED    EKG None  Radiology DG Hip Unilat W or Wo Pelvis 2-3 Views Left Result Date: 02/14/2024 CLINICAL DATA:  Pain after fall. EXAM: DG HIP (WITH OR WITHOUT PELVIS) 2-3V LEFT COMPARISON:  None Available. FINDINGS: No evidence of acute fracture of the pelvis or left hip. No hip dislocation. Mild bilateral hip osteoarthritis with acetabular spurring and subchondral cysts. The pubic rami are intact. Pubic symphysis are congruent. Surgical hardware in the lumbar spine is partially  included. IMPRESSION: 1. No acute fracture or subluxation of the pelvis or left hip. 2. Mild bilateral hip osteoarthritis. Electronically Signed   By: Chadwick Colonel M.D.   On: 02/14/2024 14:15   DG Chest 1 View Result Date: 02/14/2024 CLINICAL DATA:  Status post fall. Unwitnessed fall, unknown down time. EXAM: CHEST  1 VIEW COMPARISON:  08/30/2015 FINDINGS: Lung volumes are low. The cardiomediastinal contours are normal for technique. The lungs are clear. Pulmonary vasculature is normal. No consolidation, pleural effusion, or pneumothorax. No acute osseous abnormalities are seen. IMPRESSION: Low lung volumes without acute finding. Electronically Signed   By: Chadwick Colonel M.D.   On: 02/14/2024 14:15   CT Head Wo Contrast Result Date: 02/14/2024 CLINICAL DATA:  Altered mental status, found on ground, unknown down time EXAM: CT HEAD WITHOUT CONTRAST CT MAXILLOFACIAL WITHOUT CONTRAST CT CERVICAL SPINE WITHOUT CONTRAST TECHNIQUE: Multidetector CT imaging of the head, cervical spine, and maxillofacial structures were performed using the standard protocol without intravenous contrast. Multiplanar CT image reconstructions of the cervical spine and maxillofacial structures were also generated. RADIATION DOSE REDUCTION: This exam was performed according to the departmental dose-optimization program which includes automated exposure control, adjustment of the mA and/or kV according to patient size and/or use of iterative reconstruction technique. COMPARISON:  None Available. FINDINGS: CT HEAD FINDINGS Brain: No evidence of acute infarction, hemorrhage, hydrocephalus, extra-axial collection or mass lesion/mass effect. Nonacute right occipital encephalomalacia. Vascular: No hyperdense vessel or unexpected calcification. CT FACIAL BONES FINDINGS Skull: Normal. Negative for fracture or focal lesion. Facial bones: No displaced fractures or dislocations. Sinuses/Orbits: No acute finding. Other: Soft tissue contusion  overlying the right orbit and forehead. Patient is edentulous.  CT CERVICAL SPINE FINDINGS Alignment: Normal. Skull base and vertebrae: No acute fracture. No primary bone lesion or focal pathologic process. Soft tissues and spinal canal: No prevertebral fluid or swelling. No visible canal hematoma. Disc levels: Mild-to-moderate cervical disc degenerative disease of the lower cervical levels. Upper chest: Negative. Other: None. IMPRESSION: 1. No acute intracranial pathology. Nonacute right occipital encephalomalacia. 2. No displaced fractures or dislocations of the facial bones. 3. Soft tissue contusion overlying the right orbit and forehead. 4. No fracture or static subluxation of the cervical spine. 5. Mild-to-moderate cervical disc degenerative disease of the lower cervical levels. Electronically Signed   By: Fredricka Jenny M.D.   On: 02/14/2024 13:42   CT Cervical Spine Wo Contrast Result Date: 02/14/2024 CLINICAL DATA:  Altered mental status, found on ground, unknown down time EXAM: CT HEAD WITHOUT CONTRAST CT MAXILLOFACIAL WITHOUT CONTRAST CT CERVICAL SPINE WITHOUT CONTRAST TECHNIQUE: Multidetector CT imaging of the head, cervical spine, and maxillofacial structures were performed using the standard protocol without intravenous contrast. Multiplanar CT image reconstructions of the cervical spine and maxillofacial structures were also generated. RADIATION DOSE REDUCTION: This exam was performed according to the departmental dose-optimization program which includes automated exposure control, adjustment of the mA and/or kV according to patient size and/or use of iterative reconstruction technique. COMPARISON:  None Available. FINDINGS: CT HEAD FINDINGS Brain: No evidence of acute infarction, hemorrhage, hydrocephalus, extra-axial collection or mass lesion/mass effect. Nonacute right occipital encephalomalacia. Vascular: No hyperdense vessel or unexpected calcification. CT FACIAL BONES FINDINGS Skull: Normal.  Negative for fracture or focal lesion. Facial bones: No displaced fractures or dislocations. Sinuses/Orbits: No acute finding. Other: Soft tissue contusion overlying the right orbit and forehead. Patient is edentulous. CT CERVICAL SPINE FINDINGS Alignment: Normal. Skull base and vertebrae: No acute fracture. No primary bone lesion or focal pathologic process. Soft tissues and spinal canal: No prevertebral fluid or swelling. No visible canal hematoma. Disc levels: Mild-to-moderate cervical disc degenerative disease of the lower cervical levels. Upper chest: Negative. Other: None. IMPRESSION: 1. No acute intracranial pathology. Nonacute right occipital encephalomalacia. 2. No displaced fractures or dislocations of the facial bones. 3. Soft tissue contusion overlying the right orbit and forehead. 4. No fracture or static subluxation of the cervical spine. 5. Mild-to-moderate cervical disc degenerative disease of the lower cervical levels. Electronically Signed   By: Fredricka Jenny M.D.   On: 02/14/2024 13:42   CT Maxillofacial Wo Contrast Result Date: 02/14/2024 CLINICAL DATA:  Altered mental status, found on ground, unknown down time EXAM: CT HEAD WITHOUT CONTRAST CT MAXILLOFACIAL WITHOUT CONTRAST CT CERVICAL SPINE WITHOUT CONTRAST TECHNIQUE: Multidetector CT imaging of the head, cervical spine, and maxillofacial structures were performed using the standard protocol without intravenous contrast. Multiplanar CT image reconstructions of the cervical spine and maxillofacial structures were also generated. RADIATION DOSE REDUCTION: This exam was performed according to the departmental dose-optimization program which includes automated exposure control, adjustment of the mA and/or kV according to patient size and/or use of iterative reconstruction technique. COMPARISON:  None Available. FINDINGS: CT HEAD FINDINGS Brain: No evidence of acute infarction, hemorrhage, hydrocephalus, extra-axial collection or mass lesion/mass  effect. Nonacute right occipital encephalomalacia. Vascular: No hyperdense vessel or unexpected calcification. CT FACIAL BONES FINDINGS Skull: Normal. Negative for fracture or focal lesion. Facial bones: No displaced fractures or dislocations. Sinuses/Orbits: No acute finding. Other: Soft tissue contusion overlying the right orbit and forehead. Patient is edentulous. CT CERVICAL SPINE FINDINGS Alignment: Normal. Skull base and vertebrae: No acute fracture. No primary bone lesion  or focal pathologic process. Soft tissues and spinal canal: No prevertebral fluid or swelling. No visible canal hematoma. Disc levels: Mild-to-moderate cervical disc degenerative disease of the lower cervical levels. Upper chest: Negative. Other: None. IMPRESSION: 1. No acute intracranial pathology. Nonacute right occipital encephalomalacia. 2. No displaced fractures or dislocations of the facial bones. 3. Soft tissue contusion overlying the right orbit and forehead. 4. No fracture or static subluxation of the cervical spine. 5. Mild-to-moderate cervical disc degenerative disease of the lower cervical levels. Electronically Signed   By: Fredricka Jenny M.D.   On: 02/14/2024 13:42    Procedures .Critical Care  Performed by: Trish Furl, MD Authorized by: Trish Furl, MD   Critical care provider statement:    Critical care time (minutes):  30   Critical care was time spent personally by me on the following activities:  Development of treatment plan with patient or surrogate, discussions with consultants, evaluation of patient's response to treatment, examination of patient, ordering and review of laboratory studies, ordering and review of radiographic studies, ordering and performing treatments and interventions, pulse oximetry, re-evaluation of patient's condition and review of old charts     Medications Ordered in ED Medications  lactated ringers  infusion ( Intravenous New Bag/Given 02/15/24 0418)  heparin injection 5,000 Units  (5,000 Units Subcutaneous Given 02/15/24 0602)  sodium chloride  flush (NS) 0.9 % injection 3 mL (3 mLs Intravenous Given 02/14/24 2213)  albuterol  (PROVENTIL ) (2.5 MG/3ML) 0.083% nebulizer solution 2.5 mg (has no administration in time range)  cefTRIAXone (ROCEPHIN) 2 g in sodium chloride  0.9 % 100 mL IVPB (has no administration in time range)  acetaminophen  (TYLENOL ) suppository 650 mg (650 mg Rectal Given 02/15/24 0418)    Or  acetaminophen  (TYLENOL ) tablet 650 mg ( Oral See Alternative 02/15/24 0418)  insulin aspart (novoLOG) injection 0-9 Units (1 Units Subcutaneous Given 02/15/24 0651)  mupirocin  ointment (BACTROBAN ) 2 % ( Topical Given 02/14/24 2346)  Gerhardt's butt cream (1 Application Topical Given 02/14/24 2346)  HYDROcodone -acetaminophen  (NORCO/VICODIN) 5-325 MG per tablet 1-2 tablet (has no administration in time range)  nebivolol (BYSTOLIC) tablet 2.5 mg (2.5 mg Oral Not Given 02/14/24 2213)  dutasteride  (AVODART ) capsule 0.5 mg (0.5 mg Oral Not Given 02/14/24 2150)  tamsulosin  (FLOMAX ) capsule 0.4 mg (has no administration in time range)  pregabalin  (LYRICA ) capsule 150 mg (150 mg Oral Not Given 02/14/24 2213)  pantoprazole  (PROTONIX ) EC tablet 40 mg (has no administration in time range)  temazepam  (RESTORIL ) capsule 15 mg (has no administration in time range)  mirtazapine (REMERON) tablet 15 mg (15 mg Oral Not Given 02/14/24 2322)  lithium  carbonate capsule 150 mg (150 mg Oral Not Given 02/14/24 2150)  DULoxetine  (CYMBALTA ) DR capsule 120 mg (120 mg Oral Not Given 02/14/24 2150)  buPROPion  (WELLBUTRIN  XL) 24 hr tablet 300 mg (300 mg Oral Not Given 02/14/24 2149)  lactated ringers  bolus 1,000 mL (0 mLs Intravenous Stopped 02/14/24 1332)    And  lactated ringers  bolus 1,000 mL (0 mLs Intravenous Stopped 02/14/24 1332)    And  lactated ringers  bolus 1,000 mL (0 mLs Intravenous Stopped 02/14/24 1500)  cefTRIAXone (ROCEPHIN) 2 g in sodium chloride  0.9 % 100 mL IVPB (0 g Intravenous Stopped 02/14/24  1247)  acetaminophen  (TYLENOL ) tablet 975 mg (975 mg Oral Given 02/14/24 1514)    ED Course/ Medical Decision Making/ A&P Clinical Course as of 02/15/24 0811  Fri Feb 14, 2024  1241 Comprehensive metabolic panel(!) Creatinine increased compared to previous.  LFTs increased [JK]  1242 CBC  with Differential(!) Leukocytosis of 20,000 [JK]  1342 Staff unable to get do in and out cath  [JK]  1403 Head CT facial CT and C-spine CT without acute abnormality [JK]  1436 No evidence of fracture [JK]  1500 Case discussed with Dr. Felipe Horton regarding admission [JK]    Clinical Course User Index [JK] Trish Furl, MD                                 Medical Decision Making Problems Addressed: AKI (acute kidney injury) [facility]): acute illness or injury that poses a threat to life or bodily functions Fever, unspecified fever cause: acute illness or injury that poses a threat to life or bodily functions Non-traumatic rhabdomyolysis: acute illness or injury that poses a threat to life or bodily functions  Amount and/or Complexity of Data Reviewed Labs: ordered. Decision-making details documented in ED Course. Radiology: ordered and independent interpretation performed.  Risk Prescription drug management. Decision regarding hospitalization.   Patient presented to the ED for evaluation of weakness, found down on the ground for the last couple of days.  Patient noted to be febrile tachycardic on arrival.  Presentation concerning for the possibility of evolving sepsis.  Patient also noted to have swelling in the face.  Consider the possibility of stroke intracranial injury, cerebral hemorrhage.  Patient was noted to be febrile and tachycardic on arrival.  Sepsis protocol initiated.  Patient CT imaging fortunately did not show any signs of acute fracture or intracranial breathing.  Patient's laboratory tests were notable significant leukocytosis.  He also had elevation in his creatinine compared to previous.   Patient was treated with IV fluid hydration.  Creatinine kinase significantly elevated consistent with rhabdomyolysis from him lying down on the ground for the last couple days.  Treated presumptively for urinary tract infection initially although urinalysis still pending during his evaluation.  Patient did not spontaneously provide a urine initially and staff were able to catheterize.  Bladder scan performed to rule out retention.  Will continue to hydrate and obtain a urine sample.  Multiple skin abrasions noted but no obvious signs of cellulitis  Case discussed with Dr Felipe Horton regarding admission.        Final Clinical Impression(s) / ED Diagnoses Final diagnoses:  Fever, unspecified fever cause  Non-traumatic rhabdomyolysis  AKI (acute kidney injury) [facility])    Rx / DC Orders ED Discharge Orders     None         Trish Furl, MD 02/15/24 [id number]

## 2024-02-14 NOTE — Sepsis Progress Note (Signed)
 eLink is following this Code Sepsis.

## 2024-02-14 NOTE — H&P (Signed)
 History and Physical    Patient: Douglas Price:[id number] DOB: 07-19-1951 DOA: 02/14/2024 DOS: the patient was seen and examined on 02/14/2024 PCP: Lonzie Robins, MD  Patient coming from: Home  Chief Complaint:  Chief Complaint  Patient presents with   Code Sepsis   HPI: Douglas Price is a 73 y.o. male with medical history significant of Parkinson's disease, diabetes mellitus type II, depression, RLS, tobacco abuse, and GERD presents after having a fall at home.  History is obtained from review of records and talks with his sister who was present at bedside.  Patient had gone to work 2 days ago.  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  His sister thinks that he possibly fell Wednesday night as he had a package from [place] that came on Thursday and he was usually good about getting them.  He is not totally sure why he fell, but thinks that he may have passed out.  Admits to hitting the right side of his face and having pain thereafter.  His sister makes note that he has significant neuropathy in his feet and possibly could have tripped.  He feels like his tremor is worse than usual.  Additionally, he mentions experiencing a burning sensation during urination.  Denied having abdominal pain.  Normally he lives at home alone and ambulates with use of a walker.  His sister is concerned that he is not safe to live alone and likely needs placement.  In the emergency department patient was noted to be febrile up to 102.6 F with tachycardia and tachypnea meeting SIRS criteria.  Labs significant for WBC 20.4, BUN 38, creatinine 1.56 glucose 186, AST 459, ALT 117,  and CK 19,111.  Blood cultures were obtained.  X-rays of the chest and pelvis did not note any acute abnormalities.  Subsequent CT scan of the head, cervical spine, maxillofacial were obtained which  revealed nonacute right occipital encephalomalacia, soft tissue contusion over the right orbit, and no other acute abnormality.  Patient was bolused 3 L of lactated Ringer 's and Rocephin IV.  Review of Systems: As mentioned in the history of present illness. All other systems reviewed and are negative. Past Medical History:  Diagnosis Date   Allergic rhinitis due to pollen    Benign neoplasm of colon    Blisters with epidermal loss due to burn (second degree) of foot    Complication of anesthesia    pt woke up during saphenous vein surgery-had epidural   Constipation due to pain medication    Degeneration of lumbar or lumbosacral intervertebral disc    Diverticulosis of colon (without mention of hemorrhage)    DM (diabetes mellitus) (HCC)    Esophageal reflux    Hypertrophy of prostate with urinary obstruction and other lower urinary tract symptoms (LUTS)    Loss of weight    Neuromuscular disorder (HCC)    peripheral neuropathy   Obesity, unspecified    Pain in limb    Restless legs syndrome (RLS)    Sleep disturbance 03/24/2014   Spinal stenosis, unspecified region other than cervical    Tobacco use disorder    Unspecified disease of pericardium    Unspecified essential hypertension    Unspecified local infection of skin and subcutaneous tissue    Varices of other sites    Past Surgical History:  Procedure Laterality Date   BACK SURGERY     CATARACT EXTRACTION Bilateral    one in july and other in Aug.2023   COLONOSCOPY W/ POLYPECTOMY     CYST EXCISION Left 04/09/2022   Procedure: LEFT INDEX FINGER MUCOID CYST EXCISION;  Surgeon: Brunilda Capra, MD;  Location: [facility];  Service: Orthopedics;  Laterality: Left;  Bier block   CYST EXCISION Left 05/02/2023   Procedure: LEFT INDEX FINGER MUCOID CYST EXCISION AND DISTAL INTERPHALANGEAL JOINT DEBRIDEMENT;  Surgeon: Brunilda Capra, MD;  Location: [facility];  Service: Orthopedics;  Laterality: Left;    LUMBAR LAMINECTOMY/DECOMPRESSION MICRODISCECTOMY Bilateral 09/08/2015   Procedure: Laminectomy and Foraminotomy - Lumbar two-lumbar three bilateral;  Surgeon: Isadora Mar, MD;  Location: [facility];  Service: Neurosurgery;  Laterality: Bilateral;   SAPHENOUS VEIN GRAFT RESECTION Right 09/24/1989   TENDON EXPLORATION Left 04/09/2022   Procedure: DEBRIDEMENT DISTAL INTERPHALANGEAL JOINT LEFT INDEX FINGER;  Surgeon: Brunilda Capra, MD;  Location: [facility];  Service: Orthopedics;  Laterality: Left;  Bier block   TONSILLECTOMY     TRIGGER FINGER RELEASE Left 05/02/2023   Procedure: LEFT RING FINGER TRIGGER RELEASE;  Surgeon: Brunilda Capra, MD;  Location: [facility];  Service: Orthopedics;  Laterality: Left;   Social History:  reports that he has been smoking pipe. He has never used smokeless tobacco. He reports that he does not drink alcohol and does not use drugs.  Allergies  Allergen Reactions   Avelox [Moxifloxacin] Other (See Comments)    Angioedema Fatigue   Quinolones Other (See Comments)    Angioedema   Lexapro [Escitalopram] Other (See Comments)    unknown    Family History  Problem Relation Age of Onset   Heart failure Mother    Lung cancer Father    Neuropathy Brother    Hypertension Paternal Grandfather    Tuberculosis Maternal Uncle    Cancer - Colon Maternal Uncle     Prior to Admission medications   Medication Sig Start Date End Date Taking? Authorizing Provider  buPROPion  (WELLBUTRIN  XL) 300 MG 24 hr tablet TAKE 1 TABLET(300 MG) BY MOUTH DAILY 12/15/23   Marvia Slocumb T, PA-C  clonazePAM  (KLONOPIN ) 0.5 MG tablet Take 1 tablet (0.5 mg total) by mouth 2 (two) times daily as needed for anxiety. 11/21/23   Verneda Golder, PA-C  DULoxetine  (CYMBALTA ) 60 MG capsule Take 120 mg by mouth daily.     [provider]  dutasteride  (AVODART ) 0.5 MG capsule Take 0.5 mg by mouth daily.    [provider]  HYDROcodone -acetaminophen   (NORCO/VICODIN) 5-325 MG tablet  12/24/11   [provider]  HYDROcodone -acetaminophen  (NORCO/VICODIN) 5-325 MG tablet 1-2 tabs PO q6 hours prn pain 04/09/22   Kuzma, Kevin, MD  lithium  carbonate 150 MG capsule TAKE 1 CAPSULE(150 MG) BY MOUTH EVERY EVENING 12/15/23   Hurst, Ammon Bales T, PA-C  loratadine (CLARITIN) 10 MG tablet Take 10 mg by mouth daily as needed.    [provider]  metFORMIN  (GLUCOPHAGE ) 500 MG tablet Take 500 mg by mouth daily with breakfast.    [provider]  mirtazapine (REMERON) 15 MG tablet Take 15 mg by mouth at bedtime.    [provider]  nebivolol (BYSTOLIC) 5 MG tablet TK 1 T PO D 11/04/17   [provider]  omeprazole (PRILOSEC) 20 MG capsule Take 20 mg by mouth 2 (two) times daily before a meal.     [provider]  pregabalin  (LYRICA ) 150 MG capsule Take 150 mg by mouth 2 (two) times daily.    [provider]  tamsulosin  (FLOMAX ) 0.4 MG CAPS capsule Take 0.4 mg by mouth daily.    [provider]  temazepam  (RESTORIL ) 15 MG capsule Take 1 capsule (15 mg total) by mouth at bedtime as needed for sleep. 11/21/23   Verneda Golder, PA-C    Physical Exam: Vitals:   02/14/24 1130 02/14/24 1134 02/14/24 1214 02/14/24 1442  BP: (!) 152/97  133/61 (!) 169/94  Pulse: (!) 114  (!) 114 (!) 110  Resp: (!) 25  (!) 24 (!) 24  Temp:  (!) 102.6 F (39.2 C)    TempSrc:  Rectal    SpO2: 97%  98% 96%   Constitutional: Elderly male who appears to be ill Eyes: PERRL, bruising and erythema surrounding the right eye with mucous drainage ENMT: Mucous membranes are dry.    Normal dentition.  Neck: normal, supple,  Respiratory: clear to auscultation bilaterally, no wheezing, no crackles. Normal respiratory effort. No accessory muscle use.  Cardiovascular: Tachycardic. No extremity edema. 2+ pedal pulses. No carotid bruits.  Abdomen: no tenderness, no masses palpated.   Bowel sounds positive.  Musculoskeletal: no  clubbing / cyanosis. No joint deformity upper and lower extremities.  Contractures noted of the right fingers.   Skin: Multiple areas of bruising and open wounds noted all over his body with some wounds having areas of erythema surrounding. Neurologic: CN 2-12 grossly intact.  Decreased sensation of the lower extremities.  Able to move all extremities.  Significant resting tremor present Psychiatric: Normal judgment and insight. Alert and oriented x 3. Normal mood.   Data Reviewed:  EKG pending.  Reviewed labs, imaging, and pertinent records as documented.  Assessment and Plan:   Suspected sepsis, unclear source Patient presented with fever up to 102.6 F with tachycardia and tachypnea meeting SIRS criteria.  Lactic acid was elevated 20.4, but lactic acid was reassuring at 1.7.  Checks x-ray noted low lung volumes.  Blood cultures have been obtained.  Patient had been started on empiric antibiotics of Rocephin due to concern for a urinary tract infection versus possible cellulitis. - Admit to a telemetry bed  - Follow-up blood cultures - Follow-up urinalysis with reflex to culture - Check respiratory virus panel - Check MRSA screen - Add-on ESR, CRP - Continue empiric antibiotics of Rocephin.  Consider broadening antibiotics if medically warranted - Acetaminophen  as needed for fever - Recheck  Acute kidney injury secondary to rhabdomyolysis Patient presents after having a fall being on the floor possibly for the last 2 days.  CK noted to be elevated at [address].  Labs revealed creatinine elevated up to 1.56 with BUN 38.  Baseline creatinine previously noted to be around 1. - Strict I&Os - Avoid nephrotoxic agent - Continue IV fluids at 150 mL/h as tolerated - Recheck kidney function in a.m.  Multiple wounds of skin Possible cellulitis Patient noted to have multiple wounds present all over his body with some having surrounding erythema. - Antibiotics as noted above - Wound care  consulted  Transaminitis Acute.  AST 459 and ALT 117.  The acute AST to ALT elevation appears to be likely secondary to rhabdomyolysis. - Recheck LFTs in a.m.  Fall at home Facial contusion Gait disturbance At baseline patient ambulates with the use of a walker due to gait instability.  Unclear of the cause of patient's fall.  Imaging studies did not show any acute fractures, but  did have right facial contusion. - Transitions of care consulted for possible need of placement - Will need to consult PT/OT once rhabdomyolysis has resolved   Diabetes mellitus type 2, with polyneuropathy On admission glucose noted to be elevated up to 186.  Home medication regimen includes metformin . - Hypoglycemic protocols - Hold metformin  - Continue Lyrica  - CBGs before every meal with sensitive SSI  Parkinson's disease Patient is not on any medications after not receiving benefit from levodopa .  Followed by Dr. Winferd Hatter of neurology in the outpatient setting.  Depression - Check lithium  level - Continue Wellbutrin , Cymbalta , lithium , and mirtazapine  Insomnia - Continue Restoril  as needed for sleep  BPH - Continue Flomax  and Avodart   GERD - Pharmacy substitution for Protonix    DVT prophylaxis: Heparin Advance Care Planning:   Code Status: Full Code    Consults: None   Family Communication: Patient's sister updated at bedside  Severity of Illness: The appropriate patient status for this patient is INPATIENT. Inpatient status is judged to be reasonable and necessary in order to provide the required intensity of service to ensure the patient's safety. The patient's presenting symptoms, physical exam findings, and initial radiographic and laboratory data in the context of their chronic comorbidities is felt to place them at high risk for further clinical deterioration. Furthermore, it is not anticipated that the patient will be medically stable for discharge from the hospital within 2 midnights of  admission.   * I certify that at the point of admission it is my clinical judgment that the patient will require inpatient hospital care spanning beyond 2 midnights from the point of admission due to high intensity of service, high risk for further deterioration and high frequency of surveillance required.*  Author: Lena Qualia, MD 02/14/2024 2:54 PM  For on call review [email or web address].

## 2024-02-14 NOTE — Consult Note (Addendum)
 WOC Nurse Consult Note: Reason for Consult: multiple wounds after being found down  Wound type: 1.  Full thickness L lower leg, L foot and toes, L shin 75% red moist 25% dark necrotic tissue, R lower leg dark dry tissue, R foot and toes full thickness red dry, R hand full thickness red dry, full thickness R ear red dry  2. Left knee Stage 2 Pressure Injury medial aspect (serum filled blister) anterior aspect Deep Tissue Pressure Injury some developing eschar; R shin Deep Tissue Pressure injury that has evolved to Stage 2, R knee DTPI black eschar, R shoulder Stage 2 Pressure Injury; L hip DTPI purple maroon discoloration, R rib cage area Deep Tissue Pressure Injury that appears to be evolving to Stage 2  3.  Sacrum some moisture associated skin damage  Pressure Injury POA: Yes Measurement: see nursing flowsheet  Wound bed: described as above  Drainage (amount, consistency, odor)  see nursing flowsheet  Periwound: L lower leg erythema  Dressing procedure/placement/frequency: Cleanse wounds to B lower legs, feet, toes, B knees, shins, R shoulder, R ribcage with Vashe wound cleanser Timm Foot [phone]) do not rinse and allow to air dry.  Cover areas with single layer Xeroform gauze Timm Foot [phone]) daily and secure with silicone foam or dry gauze and Kerlix roll gauze depending on what works best for each location.  Cover DTPI L hip with Xeroform gauze daily and secure with silicone foam.  Cleanse R hand and R ear wounds with Vashe, do not rinse and allow to air dry.  Apply Mupirocin  ointment to wound beds twice daily and leave open to air (ear).  If desired can cover hand wound with silicone foam after placing Mupirocin .   Will order Gerhardt's Butt Cream to sacrum/buttocks 2 times daily and prn soiling.   POC discussed with bedside nurse. WOC team will not follow.  Re-consult if further needs arise. Deep Tissue Pressure Injuries are at high risk to deteriorate.    Thank you,    Ronni Colace MSN,  RN-BC, [employer] [phone]

## 2024-02-14 NOTE — ED Notes (Signed)
 EDP at bedside

## 2024-02-14 NOTE — ED Notes (Signed)
 Attempted to I/O cath pt, unable to pass catheter . Dr.Knapp made aware.

## 2024-02-15 DIAGNOSIS — Z79899 Other long term (current) drug therapy: Secondary | ICD-10-CM

## 2024-02-15 DIAGNOSIS — E876 Hypokalemia: Secondary | ICD-10-CM | POA: Insufficient documentation

## 2024-02-15 DIAGNOSIS — R6510 Systemic inflammatory response syndrome (SIRS) of non-infectious origin without acute organ dysfunction: Secondary | ICD-10-CM | POA: Diagnosis not present

## 2024-02-15 DIAGNOSIS — M6282 Rhabdomyolysis: Secondary | ICD-10-CM | POA: Diagnosis not present

## 2024-02-15 DIAGNOSIS — N179 Acute kidney failure, unspecified: Secondary | ICD-10-CM | POA: Diagnosis not present

## 2024-02-15 DIAGNOSIS — H109 Unspecified conjunctivitis: Secondary | ICD-10-CM

## 2024-02-15 LAB — GLUCOSE, CAPILLARY
Glucose-Capillary: 147 mg/dL — ABNORMAL HIGH (ref 70–99)
Glucose-Capillary: 141 mg/dL — ABNORMAL HIGH (ref 70–99)
Glucose-Capillary: 145 mg/dL — ABNORMAL HIGH (ref 70–99)
Glucose-Capillary: 127 mg/dL — ABNORMAL HIGH (ref 70–99)

## 2024-02-15 LAB — COMPREHENSIVE METABOLIC PANEL WITH GFR
ALT: 161 U/L — ABNORMAL HIGH (ref 0–44)
AST: 502 U/L — ABNORMAL HIGH (ref 15–41)
Albumin: 2.9 g/dL — ABNORMAL LOW (ref 3.5–5.0)
Alkaline Phosphatase: 45 U/L (ref 38–126)
Anion gap: 13 (ref 5–15)
BUN: 40 mg/dL — ABNORMAL HIGH (ref 8–23)
CO2: 22 mmol/L (ref 22–32)
Calcium: 8.3 mg/dL — ABNORMAL LOW (ref 8.9–10.3)
Chloride: 112 mmol/L — ABNORMAL HIGH (ref 98–111)
Creatinine, Ser: 1.33 mg/dL — ABNORMAL HIGH (ref 0.61–1.24)
GFR, Estimated: 58 mL/min — ABNORMAL LOW (ref >60)
Glucose, Bld: 155 mg/dL — ABNORMAL HIGH (ref 70–99)
Potassium: 3.4 mmol/L — ABNORMAL LOW (ref 3.5–5.1)
Sodium: 147 mmol/L — ABNORMAL HIGH (ref 135–145)
Total Bilirubin: 2.2 mg/dL — ABNORMAL HIGH (ref 0.0–1.2)
Total Protein: 6.1 g/dL — ABNORMAL LOW (ref 6.5–8.1)

## 2024-02-15 LAB — RAPID URINE DRUG SCREEN, HOSP PERFORMED
Amphetamines: NOT DETECTED
Barbiturates: NOT DETECTED
Benzodiazepines: POSITIVE — AB
Cocaine: NOT DETECTED
Opiates: NOT DETECTED
Tetrahydrocannabinol: NOT DETECTED

## 2024-02-15 LAB — CBC
HCT: 39.1 % (ref 39.0–52.0)
Hemoglobin: 12.9 g/dL — ABNORMAL LOW (ref 13.0–17.0)
MCH: 30.5 pg (ref 26.0–34.0)
MCHC: 33 g/dL (ref 30.0–36.0)
MCV: 92.4 fL (ref 80.0–100.0)
Platelets: 227 10*3/uL (ref 150–400)
RBC: 4.23 MIL/uL (ref 4.22–5.81)
RDW: 14.6 % (ref 11.5–15.5)
WBC: 20.4 10*3/uL — ABNORMAL HIGH (ref 4.0–10.5)
nRBC: 0 % (ref 0.0–0.2)

## 2024-02-15 LAB — CK: Total CK: 18805 U/L — ABNORMAL HIGH (ref 49–397)

## 2024-02-15 LAB — MRSA NEXT GEN BY PCR, NASAL: MRSA by PCR Next Gen: NOT DETECTED

## 2024-02-15 MED ORDER — LACTATED RINGERS IV SOLN: INTRAVENOUS | Status: AC

## 2024-02-15 MED ORDER — ERYTHROMYCIN 5 MG/GM OP OINT
TOPICAL_OINTMENT | Freq: Four times a day (QID) | OPHTHALMIC | Status: AC
  Administered 2024-02-15 – 2024-02-20 (×15): 1 via OPHTHALMIC
  Filled 2024-02-15 (×2): qty 1

## 2024-02-15 NOTE — Plan of Care (Signed)

## 2024-02-15 NOTE — Assessment & Plan Note (Addendum)
-   baseline creatinine ~ 0.9 - patient presents with increase in creat >0.3 mg/dL above baseline or creat increase >1.5x baseline presumed to have occurred within past 7 days PTA - creat 1.56 on admission - Most certainly from volume depletion due to being on the floor prior to admission along with some probable contribution from rhabdo with severely elevated CK - Renal function normalized after fluid resuscitation

## 2024-02-15 NOTE — Hospital Course (Signed)
 Mr. Brashier is a 73 yo male with PMH depression, Parkinson's disease (not on treatment), insomnia, DMII, GERD, RLS who was brought to the hospital after being found on the floor at home soiled in urine. He had gone to work on Wednesday which was the last time he was reported to be in his normal state.  He typically ambulates with a walker at baseline and works at a school (chaperones students to various classrooms). A well check was performed on Friday after he didn't show up to school Thursday and Friday.  His sister thinks he may have been sitting on the edge of bed and leaned forward losing his balance and falling onto the ground.  Unknown whether he passed out before the fall or hit his face causing him to pass out afterwards. He underwent workup on admission and was found to be febrile with significant rhabdomyolysis and AKI. He was admitted for further workup.

## 2024-02-15 NOTE — Assessment & Plan Note (Signed)
-   On multiple sedating medications for treatment of his depression and Parkinson's along with insomnia; concerned that this contributed to his fall -Medications include Wellbutrin , Klonopin , Cymbalta , Norco, Remeron, Lyrica , Restoril

## 2024-02-15 NOTE — Assessment & Plan Note (Addendum)
-   Severely deconditioned - PT/OT following - He would ideally benefit from CIR if approved

## 2024-02-15 NOTE — Assessment & Plan Note (Signed)
 Continue Restoril  and Remeron

## 2024-02-15 NOTE — Assessment & Plan Note (Addendum)
-   Mild purulence noted in right eye with some crusting -Trial of erythromycin  for both eyes to avoid spread - Has since resolved and completed course

## 2024-02-15 NOTE — Progress Notes (Signed)
 Progress Note    BRETON BERNS   ZOX:[id number]  DOB: 1951-08-29  DOA: 02/14/2024     1 PCP: Lonzie Robins, MD  Initial CC: Found down at home  Hospital Course: [name] is a 73 yo male with PMH depression, Parkinson's disease (not on treatment), insomnia, DMII, GERD, RLS who was brought to the hospital after being found on the floor at home soiled in urine. He had gone to work on Wednesday which was the last time he was reported to be in his normal state.  He typically ambulates with a walker at baseline and works at a school (chaperones students to various classrooms). A well check was performed on Friday after he didn't show up to school Thursday and Friday.  His sister thinks he may have been sitting on the edge of bed and leaned forward losing his balance and falling onto the ground.  Unknown whether he passed out before the fall or hit his face causing him to pass out afterwards. He underwent workup on admission and was found to be febrile with significant rhabdomyolysis and AKI. He was admitted for further workup.  Interval History:  Appears uncomfortable but in no distress.  Sister bedside this morning.  Was calling out for help prior to evaluation but he did not have any significant concerns when seen.  He understands plan for fluids and treating rhabdo at this time.  Assessment and Plan: * SIRS (systemic inflammatory response syndrome) (HCC) - Presented with fever, tachycardia,, leukocytosis.  May be all due to rhabdomyolysis.  No significant source of infection at this time.  Monitor off antibiotics -Elevated lactic acid also considered in setting of volume depletion from rhabdo and not eating/drinking for probably 2 days -Urinalysis negative for signs of infection.  CXR unremarkable.    AKI (acute kidney injury) (HCC) - baseline creatinine ~ 0.9 - patient presents with increase in creat >0.3 mg/dL above baseline or creat increase >1.5x baseline presumed to have  occurred within past 7 days PTA - creat 1.56 on admission - Most certainly from volume depletion due to being on the floor prior to admission along with some probable contribution from rhabdo with severely elevated CK - Continue fluids and trending renal function - Continue bladder scans and may need Foley if any signs of retention   Rhabdomyolysis - CK 19,111 on admission - presumed down on floor ~48 hours - continue IVF and trend CK and CMP  Polypharmacy - On multiple sedating medications for treatment of his depression and Parkinson's along with insomnia; concerned that this contributed to his fall -Medications include Wellbutrin , Klonopin , Cymbalta , Norco, Remeron, Lyrica , Restoril   Multiple wounds of skin - No obvious signs of cellulitis - Continue wound care as per WOC nurse  Transaminitis - Presumed from rhabdo - Continue trending  Conjunctivitis - Mild purulence noted in right eye with some crusting -Trial of erythromycin for both eyes to avoid spread  Gait disturbance - Multifactorial from underlying Parkinson's and deconditioning  Facial contusion - Continue supportive care -No acute fractures on trauma workup.  Soft tissue contusion over right orbit and forehead  Fall at home, initial encounter - Will need PT/OT once improved  Diabetic polyneuropathy associated with type 2 diabetes mellitus (HCC) - On Lyrica  at home  Parkinson's disease [facility]) - Follows with Dr. Winferd Hatter -Has not been on treatment due to no benefit from Sinemet  and not a DBS candidate due to underlying severe depression  Depression - Follows with psychiatry outpatient, notes reviewed -  Lithium  level low and he endorses compliance -Continue lithium , Remeron, Restoril , Cymbalta , Wellbutrin   Insomnia Continue Restoril  and Remeron  BPH (benign prostatic hyperplasia) - Continue Flomax  - Monitor for any signs of urinary retention  GERD (gastroesophageal reflux disease) - Continue  Protonix   Hypokalemia - Replete as needed   Old records reviewed in assessment of this patient  Antimicrobials:   DVT prophylaxis:  heparin injection 5,000 Units Start: 02/14/24 1515   Code Status:   Code Status: Full Code  Mobility Assessment (Last 72 Hours)     Mobility Assessment     Row Name 02/15/24 0800 02/14/24 2000         Does patient have an order for bedrest or is patient medically unstable Yes- Bedfast (Level 1) - Complete Yes- Bedfast (Level 1) - Complete               Barriers to discharge: None Disposition Plan: TBD HH orders placed:  Status is: Inpatient  Objective: Blood pressure (!) 160/89, pulse 92, temperature 100.3 F (37.9 C), temperature source Axillary, resp. rate (!) 27, SpO2 95%.  Examination:  Physical Exam Constitutional:      Comments: Chronically ill-appearing elderly gentleman laying in bed with contracted right upper extremity and generalized tremors in upper extremity and overall frail appearing  HENT:     Head:     Comments: Bruising noted along right forehead and around right eye with mild purulent drainage from right eye    Mouth/Throat:     Mouth: Mucous membranes are moist.  Eyes:     General:        Right eye: Discharge present.     Extraocular Movements: Extraocular movements intact.  Cardiovascular:     Rate and Rhythm: Normal rate and regular rhythm.  Pulmonary:     Effort: Pulmonary effort is normal. No respiratory distress.     Breath sounds: Normal breath sounds. No wheezing.  Abdominal:     General: Bowel sounds are normal. There is no distension.     Palpations: Abdomen is soft.     Tenderness: There is no abdominal tenderness.  Musculoskeletal:     Cervical back: Normal range of motion and neck supple.     Comments: Right elbow flexed and unable to passively extend due to rigidity   Skin:    General: Skin is warm and dry.  Neurological:     Comments: Follow some commands and can answer questions.   Moves all 4 extremities      Consultants:    Procedures:    Data Reviewed: Results for orders placed or performed during the hospital encounter of 02/14/24 (from the past 24 hours)  Urinalysis, w/ Reflex to Culture (Infection Suspected) -Urine, Clean Catch     Status: Abnormal   Collection Time: 02/14/24  2:30 PM  Result Value Ref Range   Specimen Source URINE, CATHETERIZED    Color, Urine AMBER (A) YELLOW   APPearance CLOUDY (A) CLEAR   Specific Gravity, Urine 1.021 1.005 - 1.030   pH 5.0 5.0 - 8.0   Glucose, UA NEGATIVE NEGATIVE mg/dL   Hgb urine dipstick LARGE (A) NEGATIVE   Bilirubin Urine NEGATIVE NEGATIVE   Ketones, ur 5 (A) NEGATIVE mg/dL   Protein, ur 161 (A) NEGATIVE mg/dL   Nitrite NEGATIVE NEGATIVE   Leukocytes,Ua NEGATIVE NEGATIVE   RBC / HPF 21-50 0 - 5 RBC/hpf   WBC, UA 0-5 0 - 5 WBC/hpf   Bacteria, UA RARE (A) NONE SEEN   Squamous  Epithelial / HPF 0-5 0 - 5 /HPF   Mucus PRESENT    Granular Casts, UA PRESENT   I-Stat Lactic Acid, ED     Status: Abnormal   Collection Time: 02/14/24  3:53 PM  Result Value Ref Range   Lactic Acid, Venous 3.4 (HH) 0.5 - 1.9 mmol/L   Comment NOTIFIED PHYSICIAN   HIV Antibody (routine testing w rflx)     Status: None   Collection Time: 02/14/24  5:56 PM  Result Value Ref Range   HIV Screen 4th Generation wRfx Non Reactive Non Reactive  Lithium  level     Status: Abnormal   Collection Time: 02/14/24  5:56 PM  Result Value Ref Range   Lithium  Lvl 0.11 (L) 0.60 - 1.20 mmol/L  Sedimentation rate     Status: None   Collection Time: 02/14/24  5:56 PM  Result Value Ref Range   Sed Rate 13 0 - 16 mm/hr  C-reactive protein     Status: Abnormal   Collection Time: 02/14/24  5:56 PM  Result Value Ref Range   CRP 8.0 (H) <1.0 mg/dL  Glucose, capillary     Status: Abnormal   Collection Time: 02/14/24  9:18 PM  Result Value Ref Range   Glucose-Capillary 149 (H) 70 - 99 mg/dL   Comment 1 Notify RN   Hemoglobin A1c     Status: None    Collection Time: 02/14/24 10:01 PM  Result Value Ref Range   Hgb A1c MFr Bld 5.6 4.8 - 5.6 %   Mean Plasma Glucose 114.02 mg/dL  MRSA Next Gen by PCR, Nasal     Status: None   Collection Time: 02/15/24 12:19 AM   Specimen: Nasal Mucosa; Nasal Swab  Result Value Ref Range   MRSA by PCR Next Gen NOT DETECTED NOT DETECTED  Glucose, capillary     Status: Abnormal   Collection Time: 02/15/24  6:18 AM  Result Value Ref Range   Glucose-Capillary 147 (H) 70 - 99 mg/dL   Comment 1 Notify RN   CBC     Status: Abnormal   Collection Time: 02/15/24  7:11 AM  Result Value Ref Range   WBC 20.4 (H) 4.0 - 10.5 K/uL   RBC 4.23 4.22 - 5.81 MIL/uL   Hemoglobin 12.9 (L) 13.0 - 17.0 g/dL   HCT 40.9 81.1 - 91.4 %   MCV 92.4 80.0 - 100.0 fL   MCH 30.5 26.0 - 34.0 pg   MCHC 33.0 30.0 - 36.0 g/dL   RDW 78.2 95.6 - 21.3 %   Platelets 227 150 - 400 K/uL   nRBC 0.0 0.0 - 0.2 %  CK     Status: Abnormal   Collection Time: 02/15/24  7:11 AM  Result Value Ref Range   Total CK 18,805 (H) 49 - 397 U/L  Comprehensive metabolic panel with GFR     Status: Abnormal   Collection Time: 02/15/24  7:11 AM  Result Value Ref Range   Sodium 147 (H) 135 - 145 mmol/L   Potassium 3.4 (L) 3.5 - 5.1 mmol/L   Chloride 112 (H) 98 - 111 mmol/L   CO2 22 22 - 32 mmol/L   Glucose, Bld 155 (H) 70 - 99 mg/dL   BUN 40 (H) 8 - 23 mg/dL   Creatinine, Ser 0.86 (H) 0.61 - 1.24 mg/dL   Calcium 8.3 (L) 8.9 - 10.3 mg/dL   Total Protein 6.1 (L) 6.5 - 8.1 g/dL   Albumin  2.9 (L) 3.5 - 5.0 g/dL  AST 502 (H) 15 - 41 U/L   ALT 161 (H) 0 - 44 U/L   Alkaline Phosphatase 45 38 - 126 U/L   Total Bilirubin 2.2 (H) 0.0 - 1.2 mg/dL   GFR, Estimated 58 (L) >60 mL/min   Anion gap 13 5 - 15  Rapid urine drug screen (hospital performed)     Status: Abnormal   Collection Time: 02/15/24 10:47 AM  Result Value Ref Range   Opiates NONE DETECTED NONE DETECTED   Cocaine NONE DETECTED NONE DETECTED   Benzodiazepines POSITIVE (A) NONE DETECTED    Amphetamines NONE DETECTED NONE DETECTED   Tetrahydrocannabinol NONE DETECTED NONE DETECTED   Barbiturates NONE DETECTED NONE DETECTED  Glucose, capillary     Status: Abnormal   Collection Time: 02/15/24 12:32 PM  Result Value Ref Range   Glucose-Capillary 141 (H) 70 - 99 mg/dL   Comment 1 Notify RN     I have reviewed pertinent nursing notes, vitals, labs, and images as necessary. I have ordered labwork to follow up on as indicated.  I have reviewed the last notes from staff over past 24 hours. I have discussed patient's care plan and test results with nursing staff, CM/SW, and other staff as appropriate.  Time spent: Greater than 50% of the 55 minute visit was spent in counseling/coordination of care for the patient as laid out in the A&P.   LOS: 1 day   Faith Homes, MD [facility] 02/15/2024, 2:22 PM

## 2024-02-15 NOTE — Assessment & Plan Note (Addendum)
-   Follows with Dr. Winferd Hatter -Has not been on treatment due to no benefit from levodopa  and not a DBS candidate due to underlying severe depression - tremoring worse in setting of acute illness; discussed with neurology briefly; will trial sinemet  again and some low dose ativan  to see if can give him some relief  - Seems to be stable on Sinemet ; Ativan  has been discontinued - will defer to neurology at follow up regarding continuation of sinemet  or not; to follow up with Dr. Winferd Hatter after discharge from rehab

## 2024-02-15 NOTE — Evaluation (Signed)
 Clinical/Bedside Swallow Evaluation Patient Details  Name: Douglas Price MRN: [id number] Date of Birth: 1950/11/09  Today's Date: 02/15/2024 Time: SLP Start Time (ACUTE ONLY): 1243 SLP Stop Time (ACUTE ONLY): 1316 SLP Time Calculation (min) (ACUTE ONLY): 33 min  Past Medical History:  Past Medical History:  Diagnosis Date   Allergic rhinitis due to pollen    Benign neoplasm of colon    Blisters with epidermal loss due to burn (second degree) of foot    Complication of anesthesia    pt woke up during saphenous vein surgery-had epidural   Constipation due to pain medication    Degeneration of lumbar or lumbosacral intervertebral disc    Diverticulosis of colon (without mention of hemorrhage)    DM (diabetes mellitus) (HCC)    Esophageal reflux    Hypertrophy of prostate with urinary obstruction and other lower urinary tract symptoms (LUTS)    Loss of weight    Neuromuscular disorder (HCC)    peripheral neuropathy   Obesity, unspecified    Pain in limb    Restless legs syndrome (RLS)    Sleep disturbance 03/24/2014   Spinal stenosis, unspecified region other than cervical    Tobacco use disorder    Unspecified disease of pericardium    Unspecified essential hypertension    Unspecified local infection of skin and subcutaneous tissue    Varices of other sites    Past Surgical History:  Past Surgical History:  Procedure Laterality Date   BACK SURGERY     CATARACT EXTRACTION Bilateral    one in july and other in Aug.2023   COLONOSCOPY W/ POLYPECTOMY     CYST EXCISION Left 04/09/2022   Procedure: LEFT INDEX FINGER MUCOID CYST EXCISION;  Surgeon: Douglas Capra, MD;  Location: [facility];  Service: Orthopedics;  Laterality: Left;  Bier block   CYST EXCISION Left 05/02/2023   Procedure: LEFT INDEX FINGER MUCOID CYST EXCISION AND DISTAL INTERPHALANGEAL JOINT DEBRIDEMENT;  Surgeon: Douglas Capra, MD;  Location: [facility];  Service: Orthopedics;   Laterality: Left;   LUMBAR LAMINECTOMY/DECOMPRESSION MICRODISCECTOMY Bilateral 09/08/2015   Procedure: Laminectomy and Foraminotomy - Lumbar two-lumbar three bilateral;  Surgeon: Douglas Mar, MD;  Location: [facility];  Service: Neurosurgery;  Laterality: Bilateral;   SAPHENOUS VEIN GRAFT RESECTION Right 09/24/1989   TENDON EXPLORATION Left 04/09/2022   Procedure: DEBRIDEMENT DISTAL INTERPHALANGEAL JOINT LEFT INDEX FINGER;  Surgeon: Douglas Capra, MD;  Location: [facility];  Service: Orthopedics;  Laterality: Left;  Bier block   TONSILLECTOMY     TRIGGER FINGER RELEASE Left 05/02/2023   Procedure: LEFT RING FINGER TRIGGER RELEASE;  Surgeon: Douglas Capra, MD;  Location: [facility];  Service: Orthopedics;  Laterality: Left;   HPI:  Douglas Price is a 73 yo male with PMH depression, Parkinson's disease (not on treatment), insomnia, DMII, GERD, RLS who was brought to the hospital after being found on the floor at home soiled in urine.  He had gone to work on Wednesday which was the last time he was reported to be in his normal state.  He typically ambulates with a walker at baseline and works at a school (chaperones students to various classrooms). A well check was performed on Friday after he didn't show up to school Thursday and Friday.   His sister thinks he may have been sitting on the edge of bed and leaned forward losing his balance and falling onto the ground.  Unknown whether he passed out before  the fall or hit his face causing him to pass out afterwards.  He underwent workup on admission and was found to be febrile with significant rhabdomyolysis and AKI.  He was admitted for further workup.  Pt reports some difficulty swallowing and intermittent coughing with meals.The pt said he prefers softer foods at home.    Assessment / Plan / Recommendation  Clinical Impression  Pt seen for ST services to determine if they are ready for PO intake. The pt presents with a  moderate-severe dysphagia. The pt is currently NPO and was assessed with ice chips, thin liquid, and puree. The pt reports being on a diet of "softer foods" at home and some mild difficulty when swallowing his sister reports intermittent coughing but overall feels as if prior to hospitalization he was [facility]. His sister reports having recently completed oral care. OME limited due to cognitive and physical impairments, he wears a denture implant for bottom teeth and had dried red patches on his tongue. Pt had difficulty with ice chip manipulation, with minimal attempts to masticate, oral holding and no swallow initiation attempted. Pt had continued difficulty with thins and purees, with oral holding (approx. 1 minute before swallow initiation attempted) prolonged oral transit, decreased hyolaryngeal excursion, with delayed coughing. PO trials stopped due to increasing s/s of aspiration. He reports this increased difficulty with swallowing is new to this hospitalization and he has not received care addressing his dysphagia prior to this. The pt presents with moderate-severe dysphagia and presents as a moderate-severe risk for aspiration. Further evaluative measures are indicated to determine safest recs. At this time, it is safest for the pt to remain NPO and receive his nutritional and medical needs via alternative means. SLP to f/u closely for dysphagia tx and a modified barium swallow study. SLP Visit Diagnosis: Dysphagia, unspecified (R13.10)    Aspiration Risk  Moderate aspiration risk;Severe aspiration risk;Risk for inadequate nutrition/hydration    Diet Recommendation NPO    Medication Administration: Via alternative means    Other  Recommendations Oral Care Recommendations: Oral care BID    Recommendations for follow up therapy are one component of a multi-disciplinary discharge planning process, led by the attending physician.  Recommendations may be updated based on patient status, additional  functional criteria and insurance authorization.  Follow up Recommendations Acute inpatient rehab (3hours/day)      Assistance Recommended at Discharge    Functional Status Assessment Patient has had a recent decline in their functional status and demonstrates the ability to make significant improvements in function in a reasonable and predictable amount of time.  Frequency and Duration min 3x week  2 weeks       Prognosis Prognosis for improved oropharyngeal function: Fair Barriers to Reach Goals: Cognitive deficits;Severity of deficits      Swallow Study   General Date of Onset: 02/15/24 HPI: Mr. Mullin is a 73 yo male with PMH depression, Parkinson's disease (not on treatment), insomnia, DMII, GERD, RLS who was brought to the hospital after being found on the floor at home soiled in urine.  He had gone to work on Wednesday which was the last time he was reported to be in his normal state.  He typically ambulates with a walker at baseline and works at a school (chaperones students to various classrooms). A well check was performed on Friday after he didn't show up to school Thursday and Friday.   His sister thinks he may have been sitting on the edge of bed and leaned forward  losing his balance and falling onto the ground.  Unknown whether he passed out before the fall or hit his face causing him to pass out afterwards.  He underwent workup on admission and was found to be febrile with significant rhabdomyolysis and AKI.  He was admitted for further workup.  Pt reports some difficulty swallowing and intermittent coughing with meals.The pt said he prefers softer foods at home. Type of Study: Bedside Swallow Evaluation Previous Swallow Assessment: n/a Diet Prior to this Study: NPO Temperature Spikes Noted: No Respiratory Status: Room air History of Recent Intubation: No Behavior/Cognition: Pleasant mood;Cooperative;Requires cueing;Other (Comment) (Requires extra processing time and  prompting for responses) Oral Cavity Assessment: Dry;Dried secretions (small red dired patches on his tonge) Oral Care Completed by SLP: Other (Comment) (recently completed by his sister using oral swabs) Oral Cavity - Dentition: Adequate natural dentition;Dentures, bottom;Other (Comment) (pt not tolerating bottom dentures well and asked for the SLP to remove them) Self-Feeding Abilities: Needs assist Patient Positioning: Upright in bed Baseline Vocal Quality: Low vocal intensity Volitional Cough: Strong Volitional Swallow: Unable to elicit    Oral/Motor/Sensory Function Overall Oral Motor/Sensory Function: Generalized oral weakness Facial ROM: Other (Comment);Reduced right;Reduced left (symmetrical and generally weak) Facial Symmetry: Within Functional Limits Lingual ROM:  (Unable to elicit) Lingual Strength: Reduced (Unable to elicit)   Ice Chips Ice chips: Impaired Presentation: Spoon Oral Phase Impairments: Reduced labial seal;Poor awareness of bolus;Reduced lingual movement/coordination Oral Phase Functional Implications: Prolonged oral transit;Oral holding Pharyngeal Phase Impairments: Unable to trigger swallow   Thin Liquid Thin Liquid: Impaired Presentation: Straw (pt reports preferring a straw) Oral Phase Impairments: Reduced labial seal;Poor awareness of bolus Oral Phase Functional Implications: Prolonged oral transit;Oral holding;Oral residue Pharyngeal  Phase Impairments: Suspected delayed Swallow;Decreased hyoid-laryngeal movement;Unable to trigger swallow;Cough - Delayed    Nectar Thick     Honey Thick     Puree Puree: Impaired Presentation: Spoon Oral Phase Impairments: Poor awareness of bolus Oral Phase Functional Implications: Oral holding Pharyngeal Phase Impairments: Unable to trigger swallow;Throat Clearing - Delayed   Solid     Solid: Not tested      Eda Gone M.S. CCC-SLP

## 2024-02-15 NOTE — Assessment & Plan Note (Addendum)
 Repleted.

## 2024-02-15 NOTE — Assessment & Plan Note (Signed)
-   No obvious signs of cellulitis - Continue wound care as per [facility] nurse

## 2024-02-15 NOTE — Assessment & Plan Note (Signed)
-   Multifactorial from underlying Parkinson's and deconditioning

## 2024-02-15 NOTE — Assessment & Plan Note (Signed)
-   Follows with psychiatry outpatient, notes reviewed - Lithium  level low and he endorses compliance -Continue lithium , Remeron, Restoril , Cymbalta , Wellbutrin

## 2024-02-15 NOTE — Assessment & Plan Note (Signed)
 On Lyrica at home.

## 2024-02-15 NOTE — Assessment & Plan Note (Signed)
-   Continue supportive care -No acute fractures on trauma workup.  Soft tissue contusion over right orbit and forehead

## 2024-02-15 NOTE — Assessment & Plan Note (Addendum)
-   Presented with fever, tachycardia,, leukocytosis.  Initially presumed to be due to rhabdomyolysis but with persistent concern for infection, was felt to have probable aspiration and treated from a pulmonary standpoint with antibiotic course -Elevated lactic acid also considered multifactorial in setting of volume depletion from rhabdo and not eating/drinking for probably 2 days as well as presumed infection after workup completed -Urinalysis negative for signs of infection.  CXR unremarkable.   - Given persistent fever and leukocytosis now with elevated procalcitonin, will go ahead and start on empiric Unasyn  and treat for suspected aspiration despite no CXR findings.  Do not appreciate any significant cellulitis on his legs either.  Biggest risk would be aspiration given how he presented and his inability to take in p.o. safely on admission - PCT normalized and course completed with unasyn  and doxy - Fever has resolved as well as mentation

## 2024-02-15 NOTE — Assessment & Plan Note (Addendum)
-   CK 19,111 on admission - presumed down on floor ~48 hours - CK normalized with aggressive fluid repletion

## 2024-02-15 NOTE — Assessment & Plan Note (Addendum)
-   Presumed from rhabdo

## 2024-02-15 NOTE — Progress Notes (Signed)
 Tylenol  given  02/15/24 1633  Vitals  Temp (!) 101.1 F (38.4 C)  Temp Source Axillary  BP (!) 166/87  MAP (mmHg) 111  BP Location Left Arm  BP Method Automatic  Patient Position (if appropriate) Lying  Pulse Rate 83  Pulse Rate Source Monitor  ECG Heart Rate 83  Resp (!) 21  MEWS COLOR  MEWS Score Color Yellow  Oxygen Therapy  SpO2 95 %  O2 Device Room Air  Pain Assessment  Pain Scale 0-10  Pain Score 0  MEWS Score  MEWS Temp 1  MEWS Systolic 0  MEWS Pulse 0  MEWS RR 1  MEWS LOC 0  MEWS Score 2

## 2024-02-15 NOTE — Assessment & Plan Note (Addendum)
-   Continue Flomax

## 2024-02-15 NOTE — Assessment & Plan Note (Signed)
-

## 2024-02-16 ENCOUNTER — Inpatient Hospital Stay (HOSPITAL_COMMUNITY)

## 2024-02-16 DIAGNOSIS — R1310 Dysphagia, unspecified: Secondary | ICD-10-CM

## 2024-02-16 DIAGNOSIS — N179 Acute kidney failure, unspecified: Secondary | ICD-10-CM | POA: Diagnosis not present

## 2024-02-16 DIAGNOSIS — E870 Hyperosmolality and hypernatremia: Secondary | ICD-10-CM

## 2024-02-16 DIAGNOSIS — R6510 Systemic inflammatory response syndrome (SIRS) of non-infectious origin without acute organ dysfunction: Secondary | ICD-10-CM | POA: Diagnosis not present

## 2024-02-16 DIAGNOSIS — M6282 Rhabdomyolysis: Secondary | ICD-10-CM | POA: Diagnosis not present

## 2024-02-16 LAB — COMPREHENSIVE METABOLIC PANEL WITH GFR
ALT: 198 U/L — ABNORMAL HIGH (ref 0–44)
AST: 472 U/L — ABNORMAL HIGH (ref 15–41)
Albumin: 2.8 g/dL — ABNORMAL LOW (ref 3.5–5.0)
Alkaline Phosphatase: 42 U/L (ref 38–126)
Anion gap: 14 (ref 5–15)
BUN: 46 mg/dL — ABNORMAL HIGH (ref 8–23)
CO2: 22 mmol/L (ref 22–32)
Calcium: 8.1 mg/dL — ABNORMAL LOW (ref 8.9–10.3)
Chloride: 115 mmol/L — ABNORMAL HIGH (ref 98–111)
Creatinine, Ser: 1.39 mg/dL — ABNORMAL HIGH (ref 0.61–1.24)
GFR, Estimated: 55 mL/min — ABNORMAL LOW (ref >60)
Glucose, Bld: 157 mg/dL — ABNORMAL HIGH (ref 70–99)
Potassium: 3.3 mmol/L — ABNORMAL LOW (ref 3.5–5.1)
Sodium: 151 mmol/L — ABNORMAL HIGH (ref 135–145)
Total Bilirubin: 2 mg/dL — ABNORMAL HIGH (ref 0.0–1.2)
Total Protein: 5.7 g/dL — ABNORMAL LOW (ref 6.5–8.1)

## 2024-02-16 LAB — GLUCOSE, CAPILLARY
Glucose-Capillary: 160 mg/dL — ABNORMAL HIGH (ref 70–99)
Glucose-Capillary: 158 mg/dL — ABNORMAL HIGH (ref 70–99)
Glucose-Capillary: 150 mg/dL — ABNORMAL HIGH (ref 70–99)
Glucose-Capillary: 153 mg/dL — ABNORMAL HIGH (ref 70–99)

## 2024-02-16 LAB — CBC WITH DIFFERENTIAL/PLATELET
Abs Immature Granulocytes: 0.17 10*3/uL — ABNORMAL HIGH (ref 0.00–0.07)
Basophils Absolute: 0 10*3/uL (ref 0.0–0.1)
Basophils Relative: 0 %
Eosinophils Absolute: 0 10*3/uL (ref 0.0–0.5)
Eosinophils Relative: 0 %
HCT: 39.9 % (ref 39.0–52.0)
Hemoglobin: 13 g/dL (ref 13.0–17.0)
Immature Granulocytes: 1 %
Lymphocytes Relative: 4 %
Lymphs Abs: 0.8 10*3/uL (ref 0.7–4.0)
MCH: 30.5 pg (ref 26.0–34.0)
MCHC: 32.6 g/dL (ref 30.0–36.0)
MCV: 93.7 fL (ref 80.0–100.0)
Monocytes Absolute: 0.9 10*3/uL (ref 0.1–1.0)
Monocytes Relative: 5 %
Neutro Abs: 16.1 10*3/uL — ABNORMAL HIGH (ref 1.7–7.7)
Neutrophils Relative %: 90 %
Platelets: 228 10*3/uL (ref 150–400)
RBC: 4.26 MIL/uL (ref 4.22–5.81)
RDW: 14.5 % (ref 11.5–15.5)
WBC: 18 10*3/uL — ABNORMAL HIGH (ref 4.0–10.5)
nRBC: 0.2 % (ref 0.0–0.2)

## 2024-02-16 LAB — CK: Total CK: 16301 U/L — ABNORMAL HIGH (ref 49–397)

## 2024-02-16 LAB — MAGNESIUM: Magnesium: 2 mg/dL (ref 1.7–2.4)

## 2024-02-16 LAB — PROCALCITONIN: Procalcitonin: 3.42 ng/mL

## 2024-02-16 MED ORDER — LACTATED RINGERS IV SOLN: INTRAVENOUS | Status: AC

## 2024-02-16 MED ORDER — SODIUM CHLORIDE 0.9 % IV SOLN
3.0000 g | Freq: Four times a day (QID) | INTRAVENOUS | Status: DC
  Administered 2024-02-16 – 2024-02-21 (×20): 3 g via INTRAVENOUS
  Filled 2024-02-16 (×20): qty 8

## 2024-02-16 MED ORDER — LABETALOL HCL 5 MG/ML IV SOLN
10.0000 mg | INTRAVENOUS | Status: DC | PRN
  Administered 2024-02-17: 10 mg via INTRAVENOUS
  Filled 2024-02-16: qty 4

## 2024-02-16 MED ORDER — MIRTAZAPINE 15 MG PO TABS
15.0000 mg | ORAL_TABLET | Freq: Every day | ORAL | Status: DC
Start: 2024-02-16 — End: 2024-02-24
  Administered 2024-02-16 – 2024-02-23 (×8): 15 mg
  Filled 2024-02-16 (×7): qty 1

## 2024-02-16 MED ORDER — NEBIVOLOL HCL 2.5 MG PO TABS
2.5000 mg | ORAL_TABLET | Freq: Every day | ORAL | Status: DC
  Administered 2024-02-17 – 2024-02-21 (×5): 2.5 mg
  Filled 2024-02-16 (×5): qty 1

## 2024-02-16 MED ORDER — ACETAMINOPHEN 10 MG/ML IV SOLN
1000.0000 mg | Freq: Once | INTRAVENOUS | Status: AC
  Administered 2024-02-16: 1000 mg via INTRAVENOUS
  Filled 2024-02-16: qty 100

## 2024-02-16 MED ORDER — LITHIUM CARBONATE 150 MG PO CAPS
150.0000 mg | ORAL_CAPSULE | Freq: Every day | ORAL | Status: DC
Start: 2024-02-16 — End: 2024-02-27
  Administered 2024-02-16 – 2024-02-26 (×11): 150 mg
  Filled 2024-02-16 (×12): qty 1

## 2024-02-16 MED ORDER — HYDRALAZINE HCL 20 MG/ML IJ SOLN
10.0000 mg | INTRAMUSCULAR | Status: DC | PRN
  Administered 2024-02-17: 10 mg via INTRAVENOUS
  Filled 2024-02-16: qty 1

## 2024-02-16 MED ORDER — PANTOPRAZOLE SODIUM 40 MG IV SOLR
40.0000 mg | INTRAVENOUS | Status: DC
  Administered 2024-02-16 – 2024-02-23 (×8): 40 mg via INTRAVENOUS
  Filled 2024-02-16 (×8): qty 10

## 2024-02-16 MED ORDER — HYDROCODONE-ACETAMINOPHEN 5-325 MG PO TABS: 1.0000 | ORAL_TABLET | Freq: Four times a day (QID) | ORAL | Status: DC | PRN

## 2024-02-16 MED ORDER — TEMAZEPAM 15 MG PO CAPS
15.0000 mg | ORAL_CAPSULE | Freq: Every evening | ORAL | Status: DC | PRN
  Administered 2024-02-19 – 2024-02-23 (×5): 15 mg
  Filled 2024-02-16 (×5): qty 1

## 2024-02-16 MED ORDER — OSMOLITE 1.2 CAL PO LIQD
1000.0000 mL | ORAL | Status: DC
  Administered 2024-02-16 (×2): 1000 mL

## 2024-02-16 MED ORDER — LABETALOL HCL 5 MG/ML IV SOLN: 20.0000 mg | INTRAVENOUS | Status: DC | PRN

## 2024-02-16 NOTE — Progress Notes (Signed)
 Speech Language Pathology Treatment: Dysphagia  Patient Details Name: Douglas Price MRN: [id number] DOB: 04/19/1951 Today's Date: 02/16/2024 Time: 0932-1001 SLP Time Calculation (min) (ACUTE ONLY): 29 min  Assessment / Plan / Recommendation Clinical Impression  Douglas Price' swallow remains significantly impaired - he continues to have tremor of jaw, UEs. Speech is poorly intelligible.  When communicating, it was easier to ask him questions that he could answer with yes/no responses.  Oral care was provided, removing thick/dry secretions from tongue and hard palate.  Ice chips and small sips of water were difficult for him to manage - there was delayed/prolonged mastication of ice; swallow response was barely palpable.  Oral suctioning provided, removing standing secretions/water.   He is unable to swallow at this point.  Spoke with Dr. Gladstone Lamer and RN, Brenita Callow.  Will need temporary enteral feeding. SLP will follow and determine need for instrumental swallow eval.    HPI HPI: Douglas Price is a 73 yo male with who was brought to ED after being found down at home. LKW 5/21, presented 5/24. Dx with rhabdo, SIRS, AKI, polypharmacy (multiple sedating meds for tx of depression and Parkinson's), multiple wounds of skin, transaminitis, facial contusion. PMH depression, Parkinson's disease (not on treatment), insomnia, DMII, GERD, RLS.      SLP Plan  Continue with current plan of care      Recommendations for follow up therapy are one component of a multi-disciplinary discharge planning process, led by the attending physician.  Recommendations may be updated based on patient status, additional functional criteria and insurance authorization.    Recommendations  Diet recommendations: NPO Medication Administration: Via alternative means                  Oral care QID     Dysphagia, oropharyngeal phase (R13.12)     Continue with current plan of care    Douglas Price L. Douglas Lincoln, MA  CCC/SLP Clinical Specialist - Acute Care SLP Acute Rehabilitation Services Office number [phone]  Douglas Price Douglas Price  02/16/2024, 10:09 AM

## 2024-02-16 NOTE — Assessment & Plan Note (Addendum)
-   Evaluated by SLP.  Still not safe for p.o. intake - NG tube placed 5/25 - Patient had steady improvement and NG tube able to be removed and he has been progressed to regular diet

## 2024-02-16 NOTE — Plan of Care (Signed)

## 2024-02-16 NOTE — Assessment & Plan Note (Addendum)
-   has been on IVF for rhabdo - Tube feeds with free water  started 02/17/2024; sodium was still trending up and appears free water  flushes may have not been administered appropriately - Na levels responded well to tube feeds and free water  flushes and has since normalized with clinical improvement

## 2024-02-16 NOTE — Plan of Care (Signed)
 Problem: Education: Goal: Knowledge of General Education information will improve Description: Including pain rating scale, medication(s)/side effects and non-pharmacologic comfort measures 02/16/2024 2003 by Les Rao, RN Outcome: Progressing 02/16/2024 1951 by Les Rao, RN Outcome: Progressing   Problem: Health Behavior/Discharge Planning: Goal: Ability to manage health-related needs will improve 02/16/2024 2003 by Les Rao, RN Outcome: Progressing 02/16/2024 1951 by Les Rao, RN Outcome: Progressing   Problem: Clinical Measurements: Goal: Ability to maintain clinical measurements within normal limits will improve 02/16/2024 2003 by Les Rao, RN Outcome: Progressing 02/16/2024 1951 by Les Rao, RN Outcome: Progressing Goal: Will remain free from infection 02/16/2024 2003 by Les Rao, RN Outcome: Progressing 02/16/2024 1951 by Les Rao, RN Outcome: Progressing Goal: Diagnostic test results will improve 02/16/2024 2003 by Les Rao, RN Outcome: Progressing 02/16/2024 1951 by Les Rao, RN Outcome: Progressing Goal: Respiratory complications will improve 02/16/2024 2003 by Les Rao, RN Outcome: Progressing 02/16/2024 1951 by Les Rao, RN Outcome: Progressing Goal: Cardiovascular complication will be avoided 02/16/2024 2003 by Les Rao, RN Outcome: Progressing 02/16/2024 1951 by Les Rao, RN Outcome: Progressing   Problem: Activity: Goal: Risk for activity intolerance will decrease 02/16/2024 2003 by Les Rao, RN Outcome: Progressing 02/16/2024 1951 by Les Rao, RN Outcome: Progressing   Problem: Nutrition: Goal: Adequate nutrition will be maintained 02/16/2024 2003 by Les Rao, RN Outcome: Progressing 02/16/2024 1951 by Les Rao, RN Outcome: Progressing   Problem: Coping: Goal: Level of anxiety will decrease 02/16/2024  2003 by Les Rao, RN Outcome: Progressing 02/16/2024 1951 by Les Rao, RN Outcome: Progressing   Problem: Elimination: Goal: Will not experience complications related to bowel motility 02/16/2024 2003 by Les Rao, RN Outcome: Progressing 02/16/2024 1951 by Les Rao, RN Outcome: Progressing Goal: Will not experience complications related to urinary retention 02/16/2024 2003 by Les Rao, RN Outcome: Progressing 02/16/2024 1951 by Les Rao, RN Outcome: Progressing   Problem: Pain Managment: Goal: General experience of comfort will improve and/or be controlled 02/16/2024 2003 by Les Rao, RN Outcome: Progressing 02/16/2024 1951 by Les Rao, RN Outcome: Progressing   Problem: Safety: Goal: Ability to remain free from injury will improve 02/16/2024 2003 by Les Rao, RN Outcome: Progressing 02/16/2024 1951 by Les Rao, RN Outcome: Progressing   Problem: Skin Integrity: Goal: Risk for impaired skin integrity will decrease 02/16/2024 2003 by Les Rao, RN Outcome: Progressing 02/16/2024 1951 by Les Rao, RN Outcome: Progressing   Problem: Education: Goal: Ability to describe self-care measures that may prevent or decrease complications (Diabetes Survival Skills Education) will improve 02/16/2024 2003 by Les Rao, RN Outcome: Progressing 02/16/2024 1951 by Les Rao, RN Outcome: Progressing Goal: Individualized Educational Video(s) 02/16/2024 2003 by Les Rao, RN Outcome: Progressing 02/16/2024 1951 by Les Rao, RN Outcome: Progressing   Problem: Coping: Goal: Ability to adjust to condition or change in health will improve 02/16/2024 2003 by Les Rao, RN Outcome: Progressing 02/16/2024 1951 by Les Rao, RN Outcome: Progressing   Problem: Fluid Volume: Goal: Ability to maintain a balanced intake and output will improve 02/16/2024  2003 by Les Rao, RN Outcome: Progressing 02/16/2024 1951 by Les Rao, RN Outcome: Progressing   Problem: Health Behavior/Discharge Planning: Goal: Ability to identify and utilize available resources and services will improve 02/16/2024 2003 by Les Rao, RN Outcome: Progressing 02/16/2024 1951 by  Izola Mart C, RN Outcome: Progressing Goal: Ability to manage health-related needs will improve 02/16/2024 2003 by Les Rao, RN Outcome: Progressing 02/16/2024 1951 by Les Rao, RN Outcome: Progressing   Problem: Metabolic: Goal: Ability to maintain appropriate glucose levels will improve 02/16/2024 2003 by Les Rao, RN Outcome: Progressing 02/16/2024 1951 by Les Rao, RN Outcome: Progressing   Problem: Nutritional: Goal: Maintenance of adequate nutrition will improve 02/16/2024 2003 by Les Rao, RN Outcome: Progressing 02/16/2024 1951 by Les Rao, RN Outcome: Progressing Goal: Progress toward achieving an optimal weight will improve 02/16/2024 2003 by Les Rao, RN Outcome: Progressing 02/16/2024 1951 by Les Rao, RN Outcome: Progressing   Problem: Skin Integrity: Goal: Risk for impaired skin integrity will decrease 02/16/2024 2003 by Les Rao, RN Outcome: Progressing 02/16/2024 1951 by Les Rao, RN Outcome: Progressing   Problem: Tissue Perfusion: Goal: Adequacy of tissue perfusion will improve 02/16/2024 2003 by Les Rao, RN Outcome: Progressing 02/16/2024 1951 by Les Rao, RN Outcome: Progressing

## 2024-02-16 NOTE — Progress Notes (Signed)
 Progress Note    Douglas Price   FAO:[id number]  DOB: 01/02/1951  DOA: 02/14/2024     2 PCP: Lonzie Robins, MD  Initial CC: Found down at home  Hospital Course: Mr. Horn is a 73 yo male with PMH depression, Parkinson's disease (not on treatment), insomnia, DMII, GERD, RLS who was brought to the hospital after being found on the floor at home soiled in urine. He had gone to work on Wednesday which was the last time he was reported to be in his normal state.  He typically ambulates with a walker at baseline and works at a school (chaperones students to various classrooms). A well check was performed on Friday after he didn't show up to school Thursday and Friday.  His sister thinks he may have been sitting on the edge of bed and leaned forward losing his balance and falling onto the ground.  Unknown whether he passed out before the fall or hit his face causing him to pass out afterwards. He underwent workup on admission and was found to be febrile with significant rhabdomyolysis and AKI. He was admitted for further workup.  Interval History:  Sister present bedside this morning.  Patient doing about the same and still appears somewhat uncomfortable but engages in conversation easily. Unsafe for oral intake and he is amenable with NG tube placement. CK remains excessively elevated but slowly coming down.  Renal function mostly stable but output not as high as expected.  Assessment and Plan: * SIRS (systemic inflammatory response syndrome) (HCC) - Presented with fever, tachycardia,, leukocytosis.  May be all due to rhabdomyolysis.  No significant source of infection at this time.  -Elevated lactic acid also considered in setting of volume depletion from rhabdo and not eating/drinking for probably 2 days -Urinalysis negative for signs of infection.  CXR unremarkable.   - Given persistent fever and leukocytosis now with elevated procalcitonin, will go ahead and start on empiric Unasyn  and treat for suspected aspiration despite no CXR findings.  Do not appreciate any significant cellulitis on his legs either.  Biggest risk would be aspiration given how he presented and his inability to take in p.o. safely now as well  AKI (acute kidney injury) (HCC) - baseline creatinine ~ 0.9 - patient presents with increase in creat >0.3 mg/dL above baseline or creat increase >1.5x baseline presumed to have occurred within past 7 days PTA - creat 1.56 on admission - Most certainly from volume depletion due to being on the floor prior to admission along with some probable contribution from rhabdo with severely elevated CK - Continue fluids and trending renal function - Continue bladder scans and may need Foley if any signs of retention   Rhabdomyolysis - CK 19,111 on admission - presumed down on floor ~48 hours - continue IVF and trend CK and CMP  Dysphagia - Evaluated by SLP.  Still not safe for p.o. intake - NG tube placement today to start tube feeds  Polypharmacy - On multiple sedating medications for treatment of his depression and Parkinson's along with insomnia; concerned that this contributed to his fall -Medications include Wellbutrin , Klonopin , Cymbalta , Norco, Remeron, Lyrica , Restoril   Multiple wounds of skin - No obvious signs of cellulitis - Continue wound care as per WOC nurse  Hypernatremia - has been on IVF for rhabdo - trying to start TF also - trend Na  Transaminitis - Presumed from rhabdo - Continue trending  Conjunctivitis - Mild purulence noted in right eye with some crusting -Trial  of erythromycin  for both eyes to avoid spread  Gait disturbance - Multifactorial from underlying Parkinson's and deconditioning  Facial contusion - Continue supportive care -No acute fractures on trauma workup.  Soft tissue contusion over right orbit and forehead  Fall at home, initial encounter - Will need PT/OT once improved  Diabetic polyneuropathy associated  with type 2 diabetes mellitus (HCC) - On Lyrica  at home  Parkinson's disease (HCC) - Follows with Dr. Winferd Hatter -Has not been on treatment due to no benefit from Sinemet  and not a DBS candidate due to underlying severe depression  Depression - Follows with psychiatry outpatient, notes reviewed - Lithium  level low and he endorses compliance -Continue lithium , Remeron , Restoril , Cymbalta , Wellbutrin   Insomnia Continue Restoril  and Remeron   BPH (benign prostatic hyperplasia) - Continue Flomax  - Monitor for any signs of urinary retention  GERD (gastroesophageal reflux disease) - Continue Protonix   Hypokalemia - Replete as needed   Old records reviewed in assessment of this patient  Antimicrobials: Unasyn  02/16/2024 >> current  DVT prophylaxis:  heparin  injection 5,000 Units Start: 02/14/24 1515   Code Status:   Code Status: Full Code  Mobility Assessment (Last 72 Hours)     Mobility Assessment     Row Name 02/15/24 1953 02/15/24 0800 02/14/24 2000       Does patient have an order for bedrest or is patient medically unstable Yes- Bedfast (Level 1) - Complete Yes- Bedfast (Level 1) - Complete Yes- Bedfast (Level 1) - Complete              Barriers to discharge: None Disposition Plan: TBD HH orders placed:  Status is: Inpatient  Objective: Blood pressure (!) 157/114, pulse 74, temperature (!) 100.6 F (38.1 C), temperature source Axillary, resp. rate (!) 21, SpO2 94%.  Examination:  Physical Exam Constitutional:      Comments: Chronically ill-appearing elderly gentleman laying in bed with contracted right upper extremity and generalized tremors in upper extremity and overall frail appearing  HENT:     Head:     Comments: Bruising noted along right forehead and around right eye with mild purulent drainage from right eye    Mouth/Throat:     Mouth: Mucous membranes are moist.  Eyes:     General:        Right eye: Discharge present.     Extraocular Movements:  Extraocular movements intact.  Cardiovascular:     Rate and Rhythm: Normal rate and regular rhythm.  Pulmonary:     Effort: Pulmonary effort is normal. No respiratory distress.     Breath sounds: Normal breath sounds. No wheezing.  Abdominal:     General: Bowel sounds are normal. There is no distension.     Palpations: Abdomen is soft.     Tenderness: There is no abdominal tenderness.  Musculoskeletal:     Cervical back: Normal range of motion and neck supple.     Comments: Right elbow flexed and unable to passively extend due to rigidity   Skin:    General: Skin is warm and dry.     Comments: Multiple lower extremity skin wounds but no obvious signs of cellulitis  Neurological:     Comments: Follow some commands and can answer questions.  Moves all 4 extremities      Consultants:    Procedures:    Data Reviewed: Results for orders placed or performed during the hospital encounter of 02/14/24 (from the past 24 hours)  Glucose, capillary     Status: Abnormal  Collection Time: 02/15/24  4:37 PM  Result Value Ref Range   Glucose-Capillary 145 (H) 70 - 99 mg/dL   Comment 1 Notify RN   Glucose, capillary     Status: Abnormal   Collection Time: 02/15/24  9:17 PM  Result Value Ref Range   Glucose-Capillary 127 (H) 70 - 99 mg/dL   Comment 1 Notify RN   Glucose, capillary     Status: Abnormal   Collection Time: 02/16/24  6:16 AM  Result Value Ref Range   Glucose-Capillary 160 (H) 70 - 99 mg/dL   Comment 1 Notify RN   CK     Status: Abnormal   Collection Time: 02/16/24  7:01 AM  Result Value Ref Range   Total CK 16,301 (H) 49 - 397 U/L  CBC with Differential/Platelet     Status: Abnormal   Collection Time: 02/16/24  7:01 AM  Result Value Ref Range   WBC 18.0 (H) 4.0 - 10.5 K/uL   RBC 4.26 4.22 - 5.81 MIL/uL   Hemoglobin 13.0 13.0 - 17.0 g/dL   HCT 16.1 09.6 - 04.5 %   MCV 93.7 80.0 - 100.0 fL   MCH 30.5 26.0 - 34.0 pg   MCHC 32.6 30.0 - 36.0 g/dL   RDW 40.9 81.1 -  91.4 %   Platelets 228 150 - 400 K/uL   nRBC 0.2 0.0 - 0.2 %   Neutrophils Relative % 90 %   Neutro Abs 16.1 (H) 1.7 - 7.7 K/uL   Lymphocytes Relative 4 %   Lymphs Abs 0.8 0.7 - 4.0 K/uL   Monocytes Relative 5 %   Monocytes Absolute 0.9 0.1 - 1.0 K/uL   Eosinophils Relative 0 %   Eosinophils Absolute 0.0 0.0 - 0.5 K/uL   Basophils Relative 0 %   Basophils Absolute 0.0 0.0 - 0.1 K/uL   Immature Granulocytes 1 %   Abs Immature Granulocytes 0.17 (H) 0.00 - 0.07 K/uL  Comprehensive metabolic panel with GFR     Status: Abnormal   Collection Time: 02/16/24  7:01 AM  Result Value Ref Range   Sodium 151 (H) 135 - 145 mmol/L   Potassium 3.3 (L) 3.5 - 5.1 mmol/L   Chloride 115 (H) 98 - 111 mmol/L   CO2 22 22 - 32 mmol/L   Glucose, Bld 157 (H) 70 - 99 mg/dL   BUN 46 (H) 8 - 23 mg/dL   Creatinine, Ser 7.82 (H) 0.61 - 1.24 mg/dL   Calcium 8.1 (L) 8.9 - 10.3 mg/dL   Total Protein 5.7 (L) 6.5 - 8.1 g/dL   Albumin  2.8 (L) 3.5 - 5.0 g/dL   AST 956 (H) 15 - 41 U/L   ALT 198 (H) 0 - 44 U/L   Alkaline Phosphatase 42 38 - 126 U/L   Total Bilirubin 2.0 (H) 0.0 - 1.2 mg/dL   GFR, Estimated 55 (L) >60 mL/min   Anion gap 14 5 - 15  Magnesium     Status: None   Collection Time: 02/16/24  7:01 AM  Result Value Ref Range   Magnesium 2.0 1.7 - 2.4 mg/dL  Procalcitonin     Status: None   Collection Time: 02/16/24  7:01 AM  Result Value Ref Range   Procalcitonin 3.42 ng/mL  Glucose, capillary     Status: Abnormal   Collection Time: 02/16/24 11:35 AM  Result Value Ref Range   Glucose-Capillary 158 (H) 70 - 99 mg/dL    I have reviewed pertinent nursing notes, vitals, labs, and  images as necessary. I have ordered labwork to follow up on as indicated.  I have reviewed the last notes from staff over past 24 hours. I have discussed patient's care plan and test results with nursing staff, CM/SW, and other staff as appropriate.  Time spent: Greater than 50% of the 55 minute visit was spent in  counseling/coordination of care for the patient as laid out in the A&P.   LOS: 2 days   Faith Homes, MD [facility] 02/16/2024, 3:48 PM

## 2024-02-17 DIAGNOSIS — R1310 Dysphagia, unspecified: Secondary | ICD-10-CM | POA: Diagnosis not present

## 2024-02-17 DIAGNOSIS — R6510 Systemic inflammatory response syndrome (SIRS) of non-infectious origin without acute organ dysfunction: Secondary | ICD-10-CM

## 2024-02-17 DIAGNOSIS — M6282 Rhabdomyolysis: Principal | ICD-10-CM

## 2024-02-17 DIAGNOSIS — N179 Acute kidney failure, unspecified: Secondary | ICD-10-CM

## 2024-02-17 DIAGNOSIS — E870 Hyperosmolality and hypernatremia: Secondary | ICD-10-CM

## 2024-02-17 DIAGNOSIS — G20A1 Parkinson's disease without dyskinesia, without mention of fluctuations: Secondary | ICD-10-CM

## 2024-02-17 LAB — COMPREHENSIVE METABOLIC PANEL WITH GFR
ALT: 217 U/L — ABNORMAL HIGH (ref 0–44)
AST: 333 U/L — ABNORMAL HIGH (ref 15–41)
Albumin: 2.6 g/dL — ABNORMAL LOW (ref 3.5–5.0)
Alkaline Phosphatase: 41 U/L (ref 38–126)
Anion gap: 10 (ref 5–15)
BUN: 38 mg/dL — ABNORMAL HIGH (ref 8–23)
CO2: 25 mmol/L (ref 22–32)
Calcium: 8 mg/dL — ABNORMAL LOW (ref 8.9–10.3)
Chloride: 120 mmol/L — ABNORMAL HIGH (ref 98–111)
Creatinine, Ser: 1.15 mg/dL (ref 0.61–1.24)
GFR, Estimated: >60 mL/min (ref >60)
Glucose, Bld: 191 mg/dL — ABNORMAL HIGH (ref 70–99)
Potassium: 3.3 mmol/L — ABNORMAL LOW (ref 3.5–5.1)
Sodium: 155 mmol/L — ABNORMAL HIGH (ref 135–145)
Total Bilirubin: 1.2 mg/dL (ref 0.0–1.2)
Total Protein: 5.5 g/dL — ABNORMAL LOW (ref 6.5–8.1)

## 2024-02-17 LAB — CBC WITH DIFFERENTIAL/PLATELET
Abs Immature Granulocytes: 0.11 10*3/uL — ABNORMAL HIGH (ref 0.00–0.07)
Basophils Absolute: 0 10*3/uL (ref 0.0–0.1)
Basophils Relative: 0 %
Eosinophils Absolute: 0 10*3/uL (ref 0.0–0.5)
Eosinophils Relative: 0 %
HCT: 42 % (ref 39.0–52.0)
Hemoglobin: 13.5 g/dL (ref 13.0–17.0)
Immature Granulocytes: 1 %
Lymphocytes Relative: 6 %
Lymphs Abs: 1.1 10*3/uL (ref 0.7–4.0)
MCH: 30.6 pg (ref 26.0–34.0)
MCHC: 32.1 g/dL (ref 30.0–36.0)
MCV: 95.2 fL (ref 80.0–100.0)
Monocytes Absolute: 1.3 10*3/uL — ABNORMAL HIGH (ref 0.1–1.0)
Monocytes Relative: 7 %
Neutro Abs: 14.9 10*3/uL — ABNORMAL HIGH (ref 1.7–7.7)
Neutrophils Relative %: 86 %
Platelets: 202 10*3/uL (ref 150–400)
RBC: 4.41 MIL/uL (ref 4.22–5.81)
RDW: 14.5 % (ref 11.5–15.5)
WBC: 17.3 10*3/uL — ABNORMAL HIGH (ref 4.0–10.5)
nRBC: 0.2 % (ref 0.0–0.2)

## 2024-02-17 LAB — GLUCOSE, CAPILLARY
Glucose-Capillary: 154 mg/dL — ABNORMAL HIGH (ref 70–99)
Glucose-Capillary: 174 mg/dL — ABNORMAL HIGH (ref 70–99)
Glucose-Capillary: 174 mg/dL — ABNORMAL HIGH (ref 70–99)
Glucose-Capillary: 173 mg/dL — ABNORMAL HIGH (ref 70–99)
Glucose-Capillary: 162 mg/dL — ABNORMAL HIGH (ref 70–99)

## 2024-02-17 LAB — CK: Total CK: 11709 U/L — ABNORMAL HIGH (ref 49–397)

## 2024-02-17 LAB — PROCALCITONIN: Procalcitonin: 1.41 ng/mL

## 2024-02-17 LAB — MAGNESIUM: Magnesium: 2.1 mg/dL (ref 1.7–2.4)

## 2024-02-17 MED ORDER — LORAZEPAM 2 MG/ML IJ SOLN
0.5000 mg | Freq: Three times a day (TID) | INTRAMUSCULAR | Status: DC
  Administered 2024-02-17 – 2024-02-18 (×6): 0.5 mg via INTRAVENOUS
  Filled 2024-02-17 (×7): qty 1

## 2024-02-17 MED ORDER — JEVITY 1.2 CAL PO LIQD
1000.0000 mL | ORAL | Status: DC
Start: 2024-02-17 — End: 2024-02-20
  Administered 2024-02-17 – 2024-02-20 (×4): 1000 mL

## 2024-02-17 MED ORDER — CARBIDOPA-LEVODOPA 25-100 MG PO TABS
1.0000 | ORAL_TABLET | Freq: Three times a day (TID) | ORAL | Status: DC
  Administered 2024-02-17 – 2024-02-24 (×22): 1
  Filled 2024-02-17 (×22): qty 1

## 2024-02-17 MED ORDER — BUPROPION HCL 75 MG PO TABS
150.0000 mg | ORAL_TABLET | Freq: Two times a day (BID) | ORAL | Status: DC
  Administered 2024-02-17 – 2024-02-23 (×14): 150 mg
  Filled 2024-02-17 (×16): qty 2

## 2024-02-17 MED ORDER — ADULT MULTIVITAMIN W/MINERALS CH
1.0000 | ORAL_TABLET | Freq: Every day | ORAL | Status: DC
  Administered 2024-02-17 – 2024-02-24 (×8): 1
  Filled 2024-02-17 (×8): qty 1

## 2024-02-17 MED ORDER — INSULIN ASPART 100 UNIT/ML IJ SOLN
0.0000 [IU] | Freq: Four times a day (QID) | INTRAMUSCULAR | Status: DC | PRN
  Administered 2024-02-17 – 2024-02-19 (×3): 1 [IU] via SUBCUTANEOUS

## 2024-02-17 MED ORDER — PREGABALIN 75 MG PO CAPS
150.0000 mg | ORAL_CAPSULE | Freq: Two times a day (BID) | ORAL | Status: DC
  Administered 2024-02-18 – 2024-02-27 (×19): 150 mg
  Filled 2024-02-17 (×19): qty 2

## 2024-02-17 MED ORDER — LACTATED RINGERS IV SOLN: INTRAVENOUS | Status: DC

## 2024-02-17 MED ORDER — OXYCODONE HCL 5 MG/5ML PO SOLN: 5.0000 mg | ORAL | Status: DC | PRN

## 2024-02-17 MED ORDER — ACETAMINOPHEN 160 MG/5ML PO SOLN: 650.0000 mg | ORAL | Status: DC | PRN

## 2024-02-17 MED ORDER — LACTATED RINGERS IV SOLN: INTRAVENOUS | Status: AC

## 2024-02-17 MED ORDER — ACETAMINOPHEN 160 MG/5ML PO SOLN
650.0000 mg | ORAL | Status: DC | PRN
  Administered 2024-02-17 – 2024-02-23 (×5): 650 mg
  Filled 2024-02-17 (×5): qty 20.3

## 2024-02-17 MED ORDER — FREE WATER
200.0000 mL | Freq: Four times a day (QID) | Status: DC
  Administered 2024-02-17 – 2024-02-18 (×4): 200 mL

## 2024-02-17 MED ORDER — PROSOURCE TF20 ENFIT COMPATIBL EN LIQD
60.0000 mL | Freq: Every day | ENTERAL | Status: DC
  Administered 2024-02-17 – 2024-02-21 (×5): 60 mL
  Filled 2024-02-17 (×5): qty 60

## 2024-02-17 NOTE — Progress Notes (Signed)
 Lab called to informed RN of all samples from this AM are hemolyzed and will recollect.

## 2024-02-17 NOTE — Progress Notes (Signed)
Patient transferred to an air mattress bed.

## 2024-02-17 NOTE — Plan of Care (Signed)
   Problem: Education: Goal: Knowledge of General Education information will improve Description: Including pain rating scale, medication(s)/side effects and non-pharmacologic comfort measures Outcome: Not Progressing

## 2024-02-17 NOTE — Progress Notes (Signed)
 Initial Nutrition Assessment  DOCUMENTATION CODES:   Not applicable  INTERVENTION:   Tube feeding via NG tube: Change to Jevity 1.2 at 75 ml/h (1800 ml per day) Prosource TF20 60 ml once daily Provides 2240 kcal, 120 gm protein, 1458 ml free water daily Free water flushes 200 ml q6h for a total of 2258 ml free water daily  MVI with minerals via tube once daily  Will need to monitor CBGs and may need to add insulin for TF coverage with increase in TF to goal rate.   NUTRITION DIAGNOSIS:   Inadequate oral intake related to dysphagia, inability to eat as evidenced by NPO status.  GOAL:   Patient will meet greater than or equal to 90% of their needs  MONITOR:   TF tolerance, Skin, Labs  REASON FOR ASSESSMENT:   Consult Enteral/tube feeding initiation and management  ASSESSMENT:   73 yo male admitted after fall at home and being on the floor for 2 days with AKI, rhabdomyolysis, multiple skin wounds. PMH includes Parkinson's disease, DM-2 with polyneuropathy, depression, RLS, tobacco abuse, GERD.  From chart review, patient has tremors in upper extremities and jaw. PTA, he was eating soft foods and had some difficulty swallowing.  Currently NPO per SLP d/t patient being unable to swallow.  16 Fr NG tube was placed 5/25.  Received MD Consult for TF initiation and management. Osmolite 1.2 is currently infusing at 50 ml/h via NGT per Adult Tube Feeding Protocol.  Labs reviewed. Na 151, K 3.3 CBG range: 150-174 x 24 h  Medications reviewed and include heparin, remeron, protonix , lithium . Novolog discontinued overnight.   Weight history reviewed. Patient with 6% weight loss over the past 10 months.  Patient is at increased nutrition risk d/t suspected no PO intake x 2 days PTA, current NPO status with inability to swallow, mild dysphagia PTA, weight loss.   NUTRITION - FOCUSED PHYSICAL EXAM:  Unable to complete  Diet Order:   Diet Order             Diet NPO time  specified  Diet effective now                   EDUCATION NEEDS:   No education needs have been identified at this time  Skin:  Skin Assessment: Skin Integrity Issues: Skin Integrity Issues:: Other (Comment) Other: possible arterial wounds to bilateral feet & toes  Last BM:  5/26 type 6  Height:   Ht Readings from Last 1 Encounters:  02/16/24 6\' 3"  (1.905 m)    Weight:   Wt Readings from Last 1 Encounters:  02/16/24 89.6 kg    Ideal Body Weight:  89.1 kg  BMI:  Body mass index is 24.69 kg/m.  Estimated Nutritional Needs:   Kcal:  2200-2400  Protein:  110-130 gm  Fluid:  2.2-2.4 L   Barnet Boots RD, LDN, CNSC Contact via secure chat. If unavailable, use group chat "RD Inpatient."

## 2024-02-17 NOTE — Progress Notes (Signed)
 Speech Language Pathology Treatment: Dysphagia  Patient Details Name: Douglas Price MRN: [id number] DOB: 11/07/1950 Today's Date: 02/17/2024 Time: 1446-1500 SLP Time Calculation (min) (ACUTE ONLY): 14 min  Assessment / Plan / Recommendation Clinical Impression  Pt's mentation appears to have declined since previous date, not making any attempts to communicate. He is not following commands and showing minimal response to presentation of swabs. He does close his lips a little around the yankauer, although not consistently. Significant tremor is still present. At baseline he sounds audibly congested, concerning for decreased secretion management, and even minimal amounts of water via swabs elicit coughing. Pt is not appropriate for POs at this time, but does now have NGT present. Education given to sister about current level of function and plan was discussed with her and Charity fundraiser. Will give pt some time for overall medical and cognitive improvement, as well as a little time with alternative nutrition source, and will f/u to see if he is more appropriate for PO trials.    HPI HPI: Douglas Price is a 73 yo male with who was brought to ED after being found down at home. LKW 5/21, presented 5/24. Dx with rhabdo, SIRS, AKI, polypharmacy (multiple sedating meds for tx of depression and Parkinson's), multiple wounds of skin, transaminitis, facial contusion. PMH depression, Parkinson's disease (not on treatment), insomnia, DMII, GERD, RLS.      SLP Plan  Continue with current plan of care      Recommendations for follow up therapy are one component of a multi-disciplinary discharge planning process, led by the attending physician.  Recommendations may be updated based on patient status, additional functional criteria and insurance authorization.    Recommendations  Diet recommendations: NPO Medication Administration: Via alternative means                  Oral care QID     Dysphagia, oropharyngeal  phase (R13.12)     Continue with current plan of care     Beth Brooke., M.A. CCC-SLP Acute Rehabilitation Services Office: [phone]  Secure chat preferred   02/17/2024, 3:08 PM

## 2024-02-17 NOTE — Progress Notes (Signed)
 Progress Note    Douglas Price   ZOX:[id number]  DOB: 14-Nov-1950  DOA: 02/14/2024     3 PCP: Lonzie Robins, MD  Initial CC: Found down at home  Hospital Course: Douglas Price is a 73 yo male with PMH depression, Parkinson's disease (not on treatment), insomnia, DMII, GERD, RLS who was brought to the hospital after being found on the floor at home soiled in urine. He had gone to work on Wednesday which was the last time he was reported to be in his normal state.  He typically ambulates with a walker at baseline and works at a school (chaperones students to various classrooms). A well check was performed on Friday after he didn't show up to school Thursday and Friday.  His sister thinks he may have been sitting on the edge of bed and leaned forward losing his balance and falling onto the ground.  Unknown whether he passed out before the fall or hit his face causing him to pass out afterwards. He underwent workup on admission and was found to be febrile with significant rhabdomyolysis and AKI. He was admitted for further workup.  Interval History:  Sister present bedside this morning.  He appears even more uncomfortable and having worsening tremor today.  Also having what sounds like more audible secretions. He was more somnolent and did not awaken as much today to follow my commands.   Assessment and Plan: * SIRS (systemic inflammatory response syndrome) (HCC) - Presented with fever, tachycardia,, leukocytosis.  May be all due to rhabdomyolysis.  No significant source of infection at this time.  -Elevated lactic acid also considered in setting of volume depletion from rhabdo and not eating/drinking for probably 2 days -Urinalysis negative for signs of infection.  CXR unremarkable.   - Given persistent fever and leukocytosis now with elevated procalcitonin, will go ahead and start on empiric Unasyn and treat for suspected aspiration despite no CXR findings.  Do not appreciate any  significant cellulitis on his legs either.  Biggest risk would be aspiration given how he presented and his inability to take in p.o. safely now as well  AKI (acute kidney injury) (HCC) - baseline creatinine ~ 0.9 - patient presents with increase in creat >0.3 mg/dL above baseline or creat increase >1.5x baseline presumed to have occurred within past 7 days PTA - creat 1.56 on admission - Most certainly from volume depletion due to being on the floor prior to admission along with some probable contribution from rhabdo with severely elevated CK - Continue fluids and trending renal function - Continue bladder scans and may need Foley if any signs of retention   Rhabdomyolysis - CK 19,111 on admission - presumed down on floor ~48 hours - continue IVF and trend CK and CMP - CK very slowly downtrending; renal function has improved back to normal  Dysphagia - Evaluated by SLP.  Still not safe for p.o. intake - NG tube placed 5/25 - Continue tube feeds; modified by RD  Hypernatremia - has been on IVF for rhabdo - Tube feeds with free water started 02/17/2024 - continue IVF with LR also - trend Na  Parkinson's disease (HCC) - Follows with Dr. Winferd Hatter -Has not been on treatment due to no benefit from levodopa  and not a DBS candidate due to underlying severe depression - tremoring worse in setting of acute illness; discussed with neurology briefly; will trial sinemet  again and some low dose ativan to see if can give him some relief   Conjunctivitis -  Mild purulence noted in right eye with some crusting -Trial of erythromycin for both eyes to avoid spread  Polypharmacy - On multiple sedating medications for treatment of his depression and Parkinson's along with insomnia; concerned that this contributed to his fall -Medications include Wellbutrin , Klonopin , Cymbalta , Norco, Remeron, Lyrica , Restoril   Gait disturbance - Multifactorial from underlying Parkinson's and deconditioning  Facial  contusion - Continue supportive care -No acute fractures on trauma workup.  Soft tissue contusion over right orbit and forehead  Fall at home, initial encounter - Will need PT/OT once improved  Transaminitis - Presumed from rhabdo - Continue trending  Multiple wounds of skin - No obvious signs of cellulitis - Continue wound care as per WOC nurse  Diabetic polyneuropathy associated with type 2 diabetes mellitus (HCC) - On Lyrica  at home  Depression - Follows with psychiatry outpatient, notes reviewed - Lithium  level low and he endorses compliance -Continue lithium , Remeron, Restoril , Cymbalta , Wellbutrin   Insomnia Continue Restoril  and Remeron  BPH (benign prostatic hyperplasia) - Continue Flomax  - Monitor for any signs of urinary retention  GERD (gastroesophageal reflux disease) - Continue Protonix   Hypokalemia - Replete as needed   Old records reviewed in assessment of this patient  Antimicrobials: Unasyn 02/16/2024 >> current  DVT prophylaxis:  heparin injection 5,000 Units Start: 02/14/24 1515   Code Status:   Code Status: Full Code  Mobility Assessment (Last 72 Hours)     Mobility Assessment     Row Name 02/17/24 0755 02/16/24 1958 02/15/24 1953 02/15/24 0800 02/14/24 2000   Does patient have an order for bedrest or is patient medically unstable Yes- Bedfast (Level 1) - Complete Yes- Bedfast (Level 1) - Complete Yes- Bedfast (Level 1) - Complete Yes- Bedfast (Level 1) - Complete Yes- Bedfast (Level 1) - Complete            Barriers to discharge: None Disposition Plan: TBD HH orders placed:  Status is: Inpatient  Objective: Blood pressure (!) 151/75, pulse 85, temperature (!) 100.7 F (38.2 C), temperature source Axillary, resp. rate 18, height 6\' 3"  (1.905 m), weight 89.6 kg, SpO2 100%.  Examination:  Physical Exam Constitutional:      Comments: Chronically ill-appearing elderly gentleman laying in bed with contracted right upper extremity  and generalized tremors in upper extremity and overall frail appearing  HENT:     Head:     Comments: Bruising noted along right forehead and around right eye with mild purulent drainage from right eye    Mouth/Throat:     Mouth: Mucous membranes are moist.  Eyes:     General:        Right eye: Discharge present.     Extraocular Movements: Extraocular movements intact.  Cardiovascular:     Rate and Rhythm: Normal rate and regular rhythm.  Pulmonary:     Effort: Pulmonary effort is normal. No respiratory distress.     Breath sounds: Normal breath sounds. No wheezing.  Abdominal:     General: Bowel sounds are normal. There is no distension.     Palpations: Abdomen is soft.     Tenderness: There is no abdominal tenderness.  Musculoskeletal:     Cervical back: Normal range of motion and neck supple.     Comments: Right elbow flexed and unable to passively extend due to rigidity   Skin:    General: Skin is warm and dry.     Comments: Multiple lower extremity skin wounds but no obvious signs of cellulitis  Neurological:  Comments: Follow some commands and can answer questions.  Moves all 4 extremities      Consultants:    Procedures:    Data Reviewed: Results for orders placed or performed during the hospital encounter of 02/14/24 (from the past 24 hours)  Glucose, capillary     Status: Abnormal   Collection Time: 02/16/24  4:11 PM  Result Value Ref Range   Glucose-Capillary 150 (H) 70 - 99 mg/dL  Glucose, capillary     Status: Abnormal   Collection Time: 02/16/24  9:12 PM  Result Value Ref Range   Glucose-Capillary 153 (H) 70 - 99 mg/dL   Comment 1 Notify RN   Glucose, capillary     Status: Abnormal   Collection Time: 02/17/24  2:01 AM  Result Value Ref Range   Glucose-Capillary 154 (H) 70 - 99 mg/dL   Comment 1 Notify RN    Comment 2 Document in Chart   Glucose, capillary     Status: Abnormal   Collection Time: 02/17/24  4:23 AM  Result Value Ref Range    Glucose-Capillary 174 (H) 70 - 99 mg/dL   Comment 1 Notify RN    Comment 2 Document in Chart   CBC with Differential/Platelet     Status: Abnormal   Collection Time: 02/17/24  6:35 AM  Result Value Ref Range   WBC 17.3 (H) 4.0 - 10.5 K/uL   RBC 4.41 4.22 - 5.81 MIL/uL   Hemoglobin 13.5 13.0 - 17.0 g/dL   HCT 43.3 29.5 - 18.8 %   MCV 95.2 80.0 - 100.0 fL   MCH 30.6 26.0 - 34.0 pg   MCHC 32.1 30.0 - 36.0 g/dL   RDW 41.6 60.6 - 30.1 %   Platelets 202 150 - 400 K/uL   nRBC 0.2 0.0 - 0.2 %   Neutrophils Relative % 86 %   Neutro Abs 14.9 (H) 1.7 - 7.7 K/uL   Lymphocytes Relative 6 %   Lymphs Abs 1.1 0.7 - 4.0 K/uL   Monocytes Relative 7 %   Monocytes Absolute 1.3 (H) 0.1 - 1.0 K/uL   Eosinophils Relative 0 %   Eosinophils Absolute 0.0 0.0 - 0.5 K/uL   Basophils Relative 0 %   Basophils Absolute 0.0 0.0 - 0.1 K/uL   Immature Granulocytes 1 %   Abs Immature Granulocytes 0.11 (H) 0.00 - 0.07 K/uL  Glucose, capillary     Status: Abnormal   Collection Time: 02/17/24  6:35 AM  Result Value Ref Range   Glucose-Capillary 174 (H) 70 - 99 mg/dL   Comment 1 Notify RN    Comment 2 Document in Chart   CK     Status: Abnormal   Collection Time: 02/17/24  8:46 AM  Result Value Ref Range   Total CK 11,709 (H) 49 - 397 U/L  Comprehensive metabolic panel with GFR     Status: Abnormal   Collection Time: 02/17/24  8:46 AM  Result Value Ref Range   Sodium 155 (H) 135 - 145 mmol/L   Potassium 3.3 (L) 3.5 - 5.1 mmol/L   Chloride 120 (H) 98 - 111 mmol/L   CO2 25 22 - 32 mmol/L   Glucose, Bld 191 (H) 70 - 99 mg/dL   BUN 38 (H) 8 - 23 mg/dL   Creatinine, Ser 6.01 0.61 - 1.24 mg/dL   Calcium 8.0 (L) 8.9 - 10.3 mg/dL   Total Protein 5.5 (L) 6.5 - 8.1 g/dL   Albumin  2.6 (L) 3.5 - 5.0 g/dL  AST 333 (H) 15 - 41 U/L   ALT 217 (H) 0 - 44 U/L   Alkaline Phosphatase 41 38 - 126 U/L   Total Bilirubin 1.2 0.0 - 1.2 mg/dL   GFR, Estimated >21 >30 mL/min   Anion gap 10 5 - 15  Magnesium     Status: None    Collection Time: 02/17/24  8:46 AM  Result Value Ref Range   Magnesium 2.1 1.7 - 2.4 mg/dL  Procalcitonin     Status: None   Collection Time: 02/17/24  8:46 AM  Result Value Ref Range   Procalcitonin 1.41 ng/mL  Glucose, capillary     Status: Abnormal   Collection Time: 02/17/24 11:55 AM  Result Value Ref Range   Glucose-Capillary 173 (H) 70 - 99 mg/dL    I have reviewed pertinent nursing notes, vitals, labs, and images as necessary. I have ordered labwork to follow up on as indicated.  I have reviewed the last notes from staff over past 24 hours. I have discussed patient's care plan and test results with nursing staff, CM/SW, and other staff as appropriate.  Time spent: Greater than 50% of the 55 minute visit was spent in counseling/coordination of care for the patient as laid out in the A&P.   LOS: 3 days   Faith Homes, MD [facility] 02/17/2024, 1:49 PM

## 2024-02-18 ENCOUNTER — Other Ambulatory Visit: Payer: Self-pay

## 2024-02-18 ENCOUNTER — Other Ambulatory Visit: Payer: Self-pay | Admitting: Physician Assistant

## 2024-02-18 DIAGNOSIS — R338 Other retention of urine: Secondary | ICD-10-CM

## 2024-02-18 DIAGNOSIS — M6282 Rhabdomyolysis: Secondary | ICD-10-CM | POA: Diagnosis not present

## 2024-02-18 DIAGNOSIS — N179 Acute kidney failure, unspecified: Secondary | ICD-10-CM | POA: Diagnosis not present

## 2024-02-18 DIAGNOSIS — R6510 Systemic inflammatory response syndrome (SIRS) of non-infectious origin without acute organ dysfunction: Secondary | ICD-10-CM | POA: Diagnosis not present

## 2024-02-18 LAB — COMPREHENSIVE METABOLIC PANEL WITH GFR
ALT: 131 U/L — ABNORMAL HIGH (ref 0–44)
AST: 252 U/L — ABNORMAL HIGH (ref 15–41)
Albumin: 2.5 g/dL — ABNORMAL LOW (ref 3.5–5.0)
Alkaline Phosphatase: 36 U/L — ABNORMAL LOW (ref 38–126)
Anion gap: 8 (ref 5–15)
BUN: 30 mg/dL — ABNORMAL HIGH (ref 8–23)
CO2: 28 mmol/L (ref 22–32)
Calcium: 7.8 mg/dL — ABNORMAL LOW (ref 8.9–10.3)
Chloride: 125 mmol/L — ABNORMAL HIGH (ref 98–111)
Creatinine, Ser: 1.23 mg/dL (ref 0.61–1.24)
GFR, Estimated: >60 mL/min (ref >60)
Glucose, Bld: 210 mg/dL — ABNORMAL HIGH (ref 70–99)
Potassium: 3.2 mmol/L — ABNORMAL LOW (ref 3.5–5.1)
Sodium: 161 mmol/L (ref 135–145)
Total Bilirubin: 1 mg/dL (ref 0.0–1.2)
Total Protein: 5.3 g/dL — ABNORMAL LOW (ref 6.5–8.1)

## 2024-02-18 LAB — BASIC METABOLIC PANEL WITH GFR
Anion gap: 3 — ABNORMAL LOW (ref 5–15)
Anion gap: 7 (ref 5–15)
BUN: 29 mg/dL — ABNORMAL HIGH (ref 8–23)
BUN: 26 mg/dL — ABNORMAL HIGH (ref 8–23)
CO2: 30 mmol/L (ref 22–32)
CO2: 27 mmol/L (ref 22–32)
Calcium: 7.5 mg/dL — ABNORMAL LOW (ref 8.9–10.3)
Calcium: 7.6 mg/dL — ABNORMAL LOW (ref 8.9–10.3)
Chloride: 128 mmol/L — ABNORMAL HIGH (ref 98–111)
Chloride: 126 mmol/L — ABNORMAL HIGH (ref 98–111)
Creatinine, Ser: 1.11 mg/dL (ref 0.61–1.24)
Creatinine, Ser: 1.04 mg/dL (ref 0.61–1.24)
GFR, Estimated: >60 mL/min (ref >60)
GFR, Estimated: >60 mL/min (ref >60)
Glucose, Bld: 187 mg/dL — ABNORMAL HIGH (ref 70–99)
Glucose, Bld: 187 mg/dL — ABNORMAL HIGH (ref 70–99)
Potassium: 3.2 mmol/L — ABNORMAL LOW (ref 3.5–5.1)
Potassium: 3.5 mmol/L (ref 3.5–5.1)
Sodium: 161 mmol/L (ref 135–145)
Sodium: 160 mmol/L — ABNORMAL HIGH (ref 135–145)

## 2024-02-18 LAB — GLUCOSE, CAPILLARY
Glucose-Capillary: 166 mg/dL — ABNORMAL HIGH (ref 70–99)
Glucose-Capillary: 173 mg/dL — ABNORMAL HIGH (ref 70–99)
Glucose-Capillary: 173 mg/dL — ABNORMAL HIGH (ref 70–99)
Glucose-Capillary: 183 mg/dL — ABNORMAL HIGH (ref 70–99)

## 2024-02-18 LAB — CBC WITH DIFFERENTIAL/PLATELET
Abs Immature Granulocytes: 0.19 10*3/uL — ABNORMAL HIGH (ref 0.00–0.07)
Basophils Absolute: 0 10*3/uL (ref 0.0–0.1)
Basophils Relative: 0 %
Eosinophils Absolute: 0 10*3/uL (ref 0.0–0.5)
Eosinophils Relative: 0 %
HCT: 42.6 % (ref 39.0–52.0)
Hemoglobin: 13.4 g/dL (ref 13.0–17.0)
Immature Granulocytes: 1 %
Lymphocytes Relative: 9 %
Lymphs Abs: 1.6 10*3/uL (ref 0.7–4.0)
MCH: 31.4 pg (ref 26.0–34.0)
MCHC: 31.5 g/dL (ref 30.0–36.0)
MCV: 99.8 fL (ref 80.0–100.0)
Monocytes Absolute: 1.4 10*3/uL — ABNORMAL HIGH (ref 0.1–1.0)
Monocytes Relative: 8 %
Neutro Abs: 15.6 10*3/uL — ABNORMAL HIGH (ref 1.7–7.7)
Neutrophils Relative %: 82 %
Platelets: 198 10*3/uL (ref 150–400)
RBC: 4.27 MIL/uL (ref 4.22–5.81)
RDW: 14.6 % (ref 11.5–15.5)
WBC: 18.9 10*3/uL — ABNORMAL HIGH (ref 4.0–10.5)
nRBC: 0.4 % — ABNORMAL HIGH (ref 0.0–0.2)

## 2024-02-18 LAB — MAGNESIUM: Magnesium: 2.2 mg/dL (ref 1.7–2.4)

## 2024-02-18 LAB — CK: Total CK: 8337 U/L — ABNORMAL HIGH (ref 49–397)

## 2024-02-18 LAB — PROCALCITONIN: Procalcitonin: 0.72 ng/mL

## 2024-02-18 MED ORDER — CHLORHEXIDINE GLUCONATE CLOTH 2 % EX PADS
6.0000 | MEDICATED_PAD | Freq: Every day | CUTANEOUS | Status: DC
  Administered 2024-02-18 – 2024-02-27 (×10): 6 via TOPICAL

## 2024-02-18 MED ORDER — LIDOCAINE HCL URETHRAL/MUCOSAL 2 % EX GEL
1.0000 | Freq: Once | CUTANEOUS | Status: DC
  Filled 2024-02-18: qty 6

## 2024-02-18 MED ORDER — LACTATED RINGERS IV SOLN: INTRAVENOUS | Status: DC

## 2024-02-18 MED ORDER — POTASSIUM CHLORIDE 20 MEQ PO PACK
40.0000 meq | PACK | ORAL | Status: AC
  Administered 2024-02-18 (×2): 40 meq
  Filled 2024-02-18 (×2): qty 2

## 2024-02-18 MED ORDER — FREE WATER
400.0000 mL | Freq: Four times a day (QID) | Status: DC
  Administered 2024-02-18 – 2024-02-19 (×4): 400 mL

## 2024-02-18 NOTE — Plan of Care (Signed)

## 2024-02-18 NOTE — Progress Notes (Signed)
 Date and time results received: 02/18/24 0642 (use smartphrase ".now" to insert current time)  Test: Sodium  Critical Value: 161  Name of Provider Notified: Ulice Gamer, MD  Orders Received? Or Actions Taken?: Orders Received - See Orders for details Free water increased to every 6 hr.

## 2024-02-18 NOTE — Assessment & Plan Note (Addendum)
-   Developed urinary retention worse on 02/17/2024 and Foley catheter placed; required coud cath due to difficulty with placement initially so would not remove until regains much more strength and cognition - okay to trial removing foley on 02/28/24 or later

## 2024-02-18 NOTE — Progress Notes (Signed)
   02/18/24 0013  Assess: MEWS Score  Temp (!) 101.2 F (38.4 C)  BP 139/71  MAP (mmHg) 90  Resp (!) 28  SpO2 99 %  O2 Device Nasal Cannula  Assess: MEWS Score  MEWS Temp 1  MEWS Systolic 0  MEWS Pulse 0  MEWS RR 2  MEWS LOC 1  MEWS Score 4  MEWS Score Color Red  Assess: if the MEWS score is Yellow or Red  Were vital signs accurate and taken at a resting state? Yes  Does the patient meet 2 or more of the SIRS criteria? Yes  Does the patient have a confirmed or suspected source of infection? Yes  MEWS guidelines implemented  Yes, red  Treat  MEWS Interventions Considered administering scheduled or prn medications/treatments as ordered  Take Vital Signs  Increase Vital Sign Frequency  Red: Q1hr x2, continue Q4hrs until patient remains green for 12hrs  Escalate  MEWS: Escalate Red: Discuss with charge nurse and notify provider. Consider notifying RRT. If remains red for 2 hours consider need for higher level of care  Notify: Charge Nurse/RN  Name of Charge Nurse/RN Notified Joys, A, RN  Provider Notification  Provider Name/Title Ulice Gamer, MD  Date Provider Notified 02/18/24  Time Provider Notified 0100  Method of Notification Page  Notification Reason Other (Comment) (red MEWS)  Provider response No new orders (PRN tylenol  given, continue to monitor)  Date of Provider Response 02/18/24  Time of Provider Response 0101  Assess: SIRS CRITERIA  SIRS Temperature  1  SIRS Respirations  1  SIRS Pulse 0  SIRS WBC 0  SIRS Score Sum  2   No new orders at this time, per MD. PRN Tylenol  given. Pt in no acute distress.

## 2024-02-18 NOTE — TOC Initial Note (Signed)
 Transition of Care [facility]) - Initial/Assessment Note    Patient Details  Name: Douglas Price MRN: [id number] Date of Birth: 06-01-1951  [facility] CM/SW Contact:    Jonathan Neighbor, RN Phone Number: 02/18/2024, 12:09 PM  Clinical Narrative:                  CM met with the patient and his sister at the bedside. Pt slept through visit. Pt still with cortrak.  PT/OT ordered and CM awaits evals to see rehab needs.  Pt was driving self/ working and managing his own affairs prior to admission. TOC following.  Expected Discharge Plan: IP Rehab Facility Barriers to Discharge: Continued Medical Work up   Patient Goals and CMS Choice   CMS Medicare.gov Compare Post Acute Care list provided to:: Patient Represenative (must comment) Choice offered to / list presented to : Sibling      Expected Discharge Plan and Services   Discharge Planning Services: CM Consult Post Acute Care Choice: IP Rehab Living arrangements for the past 2 months: Apartment                                      Prior Living Arrangements/Services Living arrangements for the past 2 months: Apartment Lives with:: Self Patient language and need for interpreter reviewed:: Yes Do you feel safe going back to the place where you live?: Yes          Current home services: DME (walker/ cane/ rollator) Criminal Activity/Legal Involvement Pertinent to Current Situation/Hospitalization: No - Comment as needed  Activities of Daily Living   ADL Screening (condition at time of admission) Independently performs ADLs?: Yes (appropriate for developmental age) Is the patient deaf or have difficulty hearing?: No Does the patient have difficulty seeing, even when wearing glasses/contacts?: No Does the patient have difficulty concentrating, remembering, or making decisions?: No  Permission Sought/Granted                  Emotional Assessment           Psych Involvement: No  (comment)  Admission diagnosis:  Rhabdomyolysis [M62.82] AKI (acute kidney injury) (HCC) [N17.9] Non-traumatic rhabdomyolysis [M62.82] Fever, unspecified fever cause [R50.9] Patient Active Problem List   Diagnosis Date Noted   Dysphagia 02/16/2024   Hypernatremia 02/16/2024   Polypharmacy 02/15/2024   Hypokalemia 02/15/2024   Conjunctivitis 02/15/2024   Rhabdomyolysis 02/14/2024   SIRS (systemic inflammatory response syndrome) (HCC) 02/14/2024   AKI (acute kidney injury) (HCC) 02/14/2024   Multiple wounds of skin 02/14/2024   Cellulitis 02/14/2024   Transaminitis 02/14/2024   Fall at home, initial encounter 02/14/2024   Facial contusion 02/14/2024   Gait disturbance 02/14/2024   Depression 02/14/2024   Insomnia 02/14/2024   BPH (benign prostatic hyperplasia) 02/14/2024   GERD (gastroesophageal reflux disease) 02/14/2024   Pain due to onychomycosis of toenails of both feet 01/31/2023   Parkinson's disease (HCC) 11/25/2017   Diabetic polyneuropathy associated with type 2 diabetes mellitus (HCC) 11/25/2017   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   S/P lumbar spinal fusion 03/14/2016   S/P lumbar laminectomy 09/08/2015   Sleep disturbance 03/24/2014   PCP:  Lonzie Robins, MD Pharmacy:   [id number] Jonette Nestle, [place] - [address] AT [facility] [address] [place] [address] Phone: [phone] Fax: [phone]  [facility] [address] [place] [address] Phone: [phone] Fax: [phone]     Social Drivers of Health (SDOH) Social History: SDOH Screenings   Food Insecurity: No Food Insecurity (02/16/2024)  Housing: Low Risk  (02/16/2024)  Transportation Needs: No Transportation Needs (02/16/2024)  Utilities: Not At Risk (02/16/2024)  Depression (PHQ2-9): Medium Risk (02/11/2023)  Financial Resource Strain: Low Risk  (02/07/2023)  Social  Connections: Moderately Isolated (02/16/2024)  Tobacco Use: High Risk (11/21/2023)   SDOH Interventions:     Readmission Risk Interventions     No data to display

## 2024-02-18 NOTE — Progress Notes (Signed)
 Bladder scan showed 426 ml of urine in bladder, pt unable to urinate. Sundil, MD notified and order for In and out catheterization placed. This RN unable to complete In and out due to resistance in pt urethra. Charge nurse attempted cath, and was unsuccessful. Sundil, MD notified.  Coude catheter ordered.

## 2024-02-18 NOTE — Progress Notes (Signed)
 Progress Note    AJIT ERRICO   FAO:[id number]  DOB: 08/24/1951  DOA: 02/14/2024     4 PCP: Lonzie Robins, MD  Initial CC: Found down at home  Hospital Course: Mr. Pak is a 73 yo male with PMH depression, Parkinson's disease (not on treatment), insomnia, DMII, GERD, RLS who was brought to the hospital after being found on the floor at home soiled in urine. He had gone to work on Wednesday which was the last time he was reported to be in his normal state.  He typically ambulates with a walker at baseline and works at a school (chaperones students to various classrooms). A well check was performed on Friday after he didn't show up to school Thursday and Friday.  His sister thinks he may have been sitting on the edge of bed and leaned forward losing his balance and falling onto the ground.  Unknown whether he passed out before the fall or hit his face causing him to pass out afterwards. He underwent workup on admission and was found to be febrile with significant rhabdomyolysis and AKI. He was admitted for further workup.  Interval History:  Sister present bedside this morning.   He is actually starting to have improvement in mentation today.  He was able to answer questions and follow commands easily.  Yesterday he could not do any of that.  He was also asking for ice water this morning. Tremoring and shaking seems to be a little bit better as well as his congestion.  Assessment and Plan: * SIRS (systemic inflammatory response syndrome) (HCC) - Presented with fever, tachycardia,, leukocytosis.  May be all due to rhabdomyolysis.  No significant source of infection at this time.  -Elevated lactic acid also considered in setting of volume depletion from rhabdo and not eating/drinking for probably 2 days -Urinalysis negative for signs of infection.  CXR unremarkable.   - Given persistent fever and leukocytosis now with elevated procalcitonin, will go ahead and start on empiric Unasyn  and treat for suspected aspiration despite no CXR findings.  Do not appreciate any significant cellulitis on his legs either.  Biggest risk would be aspiration given how he presented and his inability to take in p.o. safely now as well - PCT has been downtrending on Unasyn so will plan on probably a 7 day course. Still has fevers but this may still be from rhabdo; mentation also improving so neuro process is lower on the differential right now (no photophobia, neck stiffness/pain, and mentation now getting better)  AKI (acute kidney injury) (HCC) - baseline creatinine ~ 0.9 - patient presents with increase in creat >0.3 mg/dL above baseline or creat increase >1.5x baseline presumed to have occurred within past 7 days PTA - creat 1.56 on admission - Most certainly from volume depletion due to being on the floor prior to admission along with some probable contribution from rhabdo with severely elevated CK - Continue fluids and trending renal function - Developed urinary retention worse on 02/17/2024 and Foley catheter placed; required coud cath due to difficulty with placement initially so would not remove until regains much more strength and cognition   Rhabdomyolysis - CK 19,111 on admission - presumed down on floor ~48 hours - continue IVF and trend CK and CMP - CK very slowly downtrending; renal function has improved back to normal  Acute urinary retention - Developed urinary retention worse on 02/17/2024 and Foley catheter placed; required coud cath due to difficulty with placement initially so would not  remove until regains much more strength and cognition  Dysphagia - Evaluated by SLP.  Still not safe for p.o. intake - NG tube placed 5/25 - Continue tube feeds; modified by RD - Resume orals when deemed safe per SLP  Hypernatremia - has been on IVF for rhabdo - Tube feeds with free water started 02/17/2024; sodium was still trending up and appears free water flushes may have not been  administered appropriately -Free water flush increased on 02/18/2024 -Repeating BMP around 6 PM and will adjust free water flushes if needed -Follow-up sodium level in a.m. and further adjust free water flush accordingly  Parkinson's disease (HCC) - Follows with Dr. Winferd Hatter -Has not been on treatment due to no benefit from levodopa  and not a DBS candidate due to underlying severe depression - tremoring worse in setting of acute illness; discussed with neurology briefly; will trial sinemet  again and some low dose ativan to see if can give him some relief  - If becomes too sedated on scheduled Ativan, then needs to be discontinued  Conjunctivitis - Mild purulence noted in right eye with some crusting -Trial of erythromycin for both eyes to avoid spread  Polypharmacy - On multiple sedating medications for treatment of his depression and Parkinson's along with insomnia; concerned that this contributed to his fall -Medications include Wellbutrin , Klonopin , Cymbalta , Norco, Remeron, Lyrica , Restoril   Gait disturbance - Multifactorial from underlying Parkinson's and deconditioning  Facial contusion - Continue supportive care -No acute fractures on trauma workup.  Soft tissue contusion over right orbit and forehead  Fall at home, initial encounter - Severely deconditioned - PT/OT ordered now that he is clinically improving  Transaminitis - Presumed from rhabdo - Continue trending  Multiple wounds of skin - No obvious signs of cellulitis - Continue wound care as per WOC nurse  Diabetic polyneuropathy associated with type 2 diabetes mellitus (HCC) - On Lyrica  at home  Depression - Follows with psychiatry outpatient, notes reviewed - Lithium  level low (0.11) and he endorses compliance -Continue lithium , Remeron, Restoril , Cymbalta , Wellbutrin   Insomnia Continue Restoril  and Remeron  BPH (benign prostatic hyperplasia) - Continue Flomax  - Monitor for any signs of urinary  retention  GERD (gastroesophageal reflux disease) - Continue Protonix   Hypokalemia - Replete as needed   Old records reviewed in assessment of this patient  Antimicrobials: Unasyn 02/16/2024 >> current  DVT prophylaxis:  heparin injection 5,000 Units Start: 02/14/24 1515   Code Status:   Code Status: Full Code  Mobility Assessment (Last 72 Hours)     Mobility Assessment     Row Name 02/17/24 2000 02/17/24 1709 02/17/24 0909 02/17/24 0755 02/16/24 1958   Does patient have an order for bedrest or is patient medically unstable Yes- Bedfast (Level 1) - Complete Yes- Bedfast (Level 1) - Complete No - Continue assessment Yes- Bedfast (Level 1) - Complete Yes- Bedfast (Level 1) - Complete   What is the highest level of mobility based on the progressive mobility assessment? Level 1 (Bedfast) - Unable to balance while sitting on edge of bed -- Level 1 (Bedfast) - Unable to balance while sitting on edge of bed -- --   Is the above level different from baseline mobility prior to current illness? Yes - Recommend PT order -- -- -- --    Row Name 02/15/24 1953           Does patient have an order for bedrest or is patient medically unstable Yes- Bedfast (Level 1) - Complete  Barriers to discharge: None Disposition Plan: Possibly rehab HH orders placed:  Status is: Inpatient  Objective: Blood pressure (!) 168/89, pulse 87, temperature (!) 101.3 F (38.5 C), temperature source Oral, resp. rate 18, height 6\' 3"  (1.905 m), weight 89.6 kg, SpO2 99%.  Examination:  Physical Exam Constitutional:      Comments: Mentation appears better today.  Able to answer orientation questions appropriately and follow commands easily and move all 4 extremities to command  HENT:     Head:     Comments: Bruising noted along right forehead and around right eye with mild purulent drainage from right eye    Mouth/Throat:     Mouth: Mucous membranes are moist.  Eyes:     General:         Right eye: Discharge present.     Extraocular Movements: Extraocular movements intact.  Cardiovascular:     Rate and Rhythm: Normal rate and regular rhythm.  Pulmonary:     Effort: Pulmonary effort is normal. No respiratory distress.     Breath sounds: Normal breath sounds. No wheezing.  Abdominal:     General: Bowel sounds are normal. There is no distension.     Palpations: Abdomen is soft.     Tenderness: There is no abdominal tenderness.  Musculoskeletal:     Cervical back: Normal range of motion and neck supple.     Comments: Right elbow flexed and unable to passively extend due to rigidity   Skin:    General: Skin is warm and dry.     Comments: Multiple lower extremity skin wounds but no obvious signs of cellulitis  Neurological:     Comments: Follow some commands and can answer questions.  Moves all 4 extremities      Consultants:    Procedures:    Data Reviewed: Results for orders placed or performed during the hospital encounter of 02/14/24 (from the past 24 hours)  Glucose, capillary     Status: Abnormal   Collection Time: 02/17/24  4:02 PM  Result Value Ref Range   Glucose-Capillary 162 (H) 70 - 99 mg/dL  Glucose, capillary     Status: Abnormal   Collection Time: 02/18/24 12:16 AM  Result Value Ref Range   Glucose-Capillary 166 (H) 70 - 99 mg/dL   Comment 1 Notify RN   CBC with Differential/Platelet     Status: Abnormal   Collection Time: 02/18/24  5:29 AM  Result Value Ref Range   WBC 18.9 (H) 4.0 - 10.5 K/uL   RBC 4.27 4.22 - 5.81 MIL/uL   Hemoglobin 13.4 13.0 - 17.0 g/dL   HCT 32.2 02.5 - 42.7 %   MCV 99.8 80.0 - 100.0 fL   MCH 31.4 26.0 - 34.0 pg   MCHC 31.5 30.0 - 36.0 g/dL   RDW 06.2 37.6 - 28.3 %   Platelets 198 150 - 400 K/uL   nRBC 0.4 (H) 0.0 - 0.2 %   Neutrophils Relative % 82 %   Neutro Abs 15.6 (H) 1.7 - 7.7 K/uL   Lymphocytes Relative 9 %   Lymphs Abs 1.6 0.7 - 4.0 K/uL   Monocytes Relative 8 %   Monocytes Absolute 1.4 (H) 0.1 - 1.0  K/uL   Eosinophils Relative 0 %   Eosinophils Absolute 0.0 0.0 - 0.5 K/uL   Basophils Relative 0 %   Basophils Absolute 0.0 0.0 - 0.1 K/uL   Immature Granulocytes 1 %   Abs Immature Granulocytes 0.19 (H) 0.00 - 0.07 K/uL  Comprehensive metabolic panel with GFR     Status: Abnormal   Collection Time: 02/18/24  5:29 AM  Result Value Ref Range   Sodium 161 (HH) 135 - 145 mmol/L   Potassium 3.2 (L) 3.5 - 5.1 mmol/L   Chloride 125 (H) 98 - 111 mmol/L   CO2 28 22 - 32 mmol/L   Glucose, Bld 210 (H) 70 - 99 mg/dL   BUN 30 (H) 8 - 23 mg/dL   Creatinine, Ser 1.61 0.61 - 1.24 mg/dL   Calcium 7.8 (L) 8.9 - 10.3 mg/dL   Total Protein 5.3 (L) 6.5 - 8.1 g/dL   Albumin  2.5 (L) 3.5 - 5.0 g/dL   AST 096 (H) 15 - 41 U/L   ALT 131 (H) 0 - 44 U/L   Alkaline Phosphatase 36 (L) 38 - 126 U/L   Total Bilirubin 1.0 0.0 - 1.2 mg/dL   GFR, Estimated >04 >54 mL/min   Anion gap 8 5 - 15  Magnesium     Status: None   Collection Time: 02/18/24  5:29 AM  Result Value Ref Range   Magnesium 2.2 1.7 - 2.4 mg/dL  Procalcitonin     Status: None   Collection Time: 02/18/24  5:29 AM  Result Value Ref Range   Procalcitonin 0.72 ng/mL  CK     Status: Abnormal   Collection Time: 02/18/24  5:29 AM  Result Value Ref Range   Total CK 8,337 (H) 49 - 397 U/L  Glucose, capillary     Status: Abnormal   Collection Time: 02/18/24  6:37 AM  Result Value Ref Range   Glucose-Capillary 173 (H) 70 - 99 mg/dL   Comment 1 Notify RN   Basic metabolic panel     Status: Abnormal   Collection Time: 02/18/24  9:58 AM  Result Value Ref Range   Sodium 161 (HH) 135 - 145 mmol/L   Potassium 3.2 (L) 3.5 - 5.1 mmol/L   Chloride 128 (H) 98 - 111 mmol/L   CO2 30 22 - 32 mmol/L   Glucose, Bld 187 (H) 70 - 99 mg/dL   BUN 29 (H) 8 - 23 mg/dL   Creatinine, Ser 0.98 0.61 - 1.24 mg/dL   Calcium 7.5 (L) 8.9 - 10.3 mg/dL   GFR, Estimated >11 >91 mL/min   Anion gap 3 (L) 5 - 15  Glucose, capillary     Status: Abnormal   Collection Time:  02/18/24 12:17 PM  Result Value Ref Range   Glucose-Capillary 183 (H) 70 - 99 mg/dL   Comment 1 Notify RN    Comment 2 Document in Chart     I have reviewed pertinent nursing notes, vitals, labs, and images as necessary. I have ordered labwork to follow up on as indicated.  I have reviewed the last notes from staff over past 24 hours. I have discussed patient's care plan and test results with nursing staff, CM/SW, and other staff as appropriate.  Time spent: Greater than 50% of the 55 minute visit was spent in counseling/coordination of care for the patient as laid out in the A&P.   LOS: 4 days   Faith Homes, MD [facility] 02/18/2024, 1:53 PM

## 2024-02-18 NOTE — Progress Notes (Signed)
 Coude 16 Fr urinary catheter with 10 ml balloon syringe placed under sterile procedure with clear/yellow urine return.  No adverse effect, pt tolerated well.  Bertram Brocks   RN.

## 2024-02-18 NOTE — Progress Notes (Addendum)
 Lab called and informing that elevated serum sodium is 161. Patient has persistent hyponatremia.  Serum sodium is 155 has been trended up to 161.  Currently on free water flush 200 mL every 6 hours. Free water deficit 3.6 L. Increasing free water flush 400 mL every 6 hours and continue to check sodium level every 6 hours.  Renu Asby, MD [facility] 02/18/2024, 6:48 AM

## 2024-02-19 ENCOUNTER — Inpatient Hospital Stay (HOSPITAL_COMMUNITY)

## 2024-02-19 DIAGNOSIS — R6510 Systemic inflammatory response syndrome (SIRS) of non-infectious origin without acute organ dysfunction: Secondary | ICD-10-CM | POA: Diagnosis not present

## 2024-02-19 DIAGNOSIS — N170 Acute kidney failure with tubular necrosis: Secondary | ICD-10-CM

## 2024-02-19 LAB — URINALYSIS, ROUTINE W REFLEX MICROSCOPIC
Bacteria, UA: NONE SEEN
Bilirubin Urine: NEGATIVE
Glucose, UA: 150 mg/dL — AB
Ketones, ur: NEGATIVE mg/dL
Leukocytes,Ua: NEGATIVE
Nitrite: NEGATIVE
Protein, ur: 30 mg/dL — AB
Specific Gravity, Urine: 1.021 (ref 1.005–1.030)
pH: 5 (ref 5.0–8.0)

## 2024-02-19 LAB — COMPREHENSIVE METABOLIC PANEL WITH GFR
ALT: 135 U/L — ABNORMAL HIGH (ref 0–44)
AST: 194 U/L — ABNORMAL HIGH (ref 15–41)
Albumin: 2.3 g/dL — ABNORMAL LOW (ref 3.5–5.0)
Alkaline Phosphatase: 41 U/L (ref 38–126)
Anion gap: 11 (ref 5–15)
BUN: 25 mg/dL — ABNORMAL HIGH (ref 8–23)
CO2: 26 mmol/L (ref 22–32)
Calcium: 7.6 mg/dL — ABNORMAL LOW (ref 8.9–10.3)
Chloride: 124 mmol/L — ABNORMAL HIGH (ref 98–111)
Creatinine, Ser: 0.92 mg/dL (ref 0.61–1.24)
GFR, Estimated: >60 mL/min (ref >60)
Glucose, Bld: 180 mg/dL — ABNORMAL HIGH (ref 70–99)
Potassium: 3.6 mmol/L (ref 3.5–5.1)
Sodium: 161 mmol/L (ref 135–145)
Total Bilirubin: 1.1 mg/dL (ref 0.0–1.2)
Total Protein: 5 g/dL — ABNORMAL LOW (ref 6.5–8.1)

## 2024-02-19 LAB — CULTURE, BLOOD (ROUTINE X 2)
Culture: NO GROWTH
Culture: NO GROWTH
Special Requests: ADEQUATE

## 2024-02-19 LAB — GLUCOSE, CAPILLARY
Glucose-Capillary: 173 mg/dL — ABNORMAL HIGH (ref 70–99)
Glucose-Capillary: 176 mg/dL — ABNORMAL HIGH (ref 70–99)
Glucose-Capillary: 177 mg/dL — ABNORMAL HIGH (ref 70–99)
Glucose-Capillary: 193 mg/dL — ABNORMAL HIGH (ref 70–99)
Glucose-Capillary: 212 mg/dL — ABNORMAL HIGH (ref 70–99)

## 2024-02-19 LAB — CBC WITH DIFFERENTIAL/PLATELET
Abs Immature Granulocytes: 0.16 10*3/uL — ABNORMAL HIGH (ref 0.00–0.07)
Basophils Absolute: 0 10*3/uL (ref 0.0–0.1)
Basophils Relative: 0 %
Eosinophils Absolute: 0.3 10*3/uL (ref 0.0–0.5)
Eosinophils Relative: 2 %
HCT: 42.7 % (ref 39.0–52.0)
Hemoglobin: 13 g/dL (ref 13.0–17.0)
Immature Granulocytes: 1 %
Lymphocytes Relative: 8 %
Lymphs Abs: 1.4 10*3/uL (ref 0.7–4.0)
MCH: 31.1 pg (ref 26.0–34.0)
MCHC: 30.4 g/dL (ref 30.0–36.0)
MCV: 102.2 fL — ABNORMAL HIGH (ref 80.0–100.0)
Monocytes Absolute: 0.9 10*3/uL (ref 0.1–1.0)
Monocytes Relative: 5 %
Neutro Abs: 15.2 10*3/uL — ABNORMAL HIGH (ref 1.7–7.7)
Neutrophils Relative %: 84 %
Platelets: 164 10*3/uL (ref 150–400)
RBC: 4.18 MIL/uL — ABNORMAL LOW (ref 4.22–5.81)
RDW: 14.6 % (ref 11.5–15.5)
WBC: 17.9 10*3/uL — ABNORMAL HIGH (ref 4.0–10.5)
nRBC: 0.1 % (ref 0.0–0.2)

## 2024-02-19 LAB — BASIC METABOLIC PANEL WITH GFR
Anion gap: 6 (ref 5–15)
BUN: 25 mg/dL — ABNORMAL HIGH (ref 8–23)
CO2: 29 mmol/L (ref 22–32)
Calcium: 7.6 mg/dL — ABNORMAL LOW (ref 8.9–10.3)
Chloride: 123 mmol/L — ABNORMAL HIGH (ref 98–111)
Creatinine, Ser: 0.9 mg/dL (ref 0.61–1.24)
GFR, Estimated: >60 mL/min (ref >60)
Glucose, Bld: 200 mg/dL — ABNORMAL HIGH (ref 70–99)
Potassium: 3.2 mmol/L — ABNORMAL LOW (ref 3.5–5.1)
Sodium: 158 mmol/L — ABNORMAL HIGH (ref 135–145)

## 2024-02-19 LAB — CK: Total CK: 3675 U/L — ABNORMAL HIGH (ref 49–397)

## 2024-02-19 LAB — MAGNESIUM: Magnesium: 2 mg/dL (ref 1.7–2.4)

## 2024-02-19 MED ORDER — POTASSIUM CHLORIDE 20 MEQ PO PACK
40.0000 meq | PACK | Freq: Once | ORAL | Status: AC
  Administered 2024-02-19: 40 meq via ORAL
  Filled 2024-02-19: qty 2

## 2024-02-19 MED ORDER — BACID PO TABS: 2.0000 | ORAL_TABLET | Freq: Three times a day (TID) | ORAL | Status: DC

## 2024-02-19 MED ORDER — FREE WATER
400.0000 mL | Status: DC
  Administered 2024-02-19 – 2024-02-20 (×8): 400 mL

## 2024-02-19 MED ORDER — DEXTROSE 5 % IV SOLN: INTRAVENOUS | Status: AC

## 2024-02-19 MED ORDER — LORAZEPAM 2 MG/ML IJ SOLN: 0.5000 mg | Freq: Four times a day (QID) | INTRAMUSCULAR | Status: DC | PRN

## 2024-02-19 MED ORDER — DOXYCYCLINE HYCLATE 100 MG PO TABS
100.0000 mg | ORAL_TABLET | Freq: Two times a day (BID) | ORAL | Status: AC
  Administered 2024-02-19 – 2024-02-23 (×10): 100 mg
  Filled 2024-02-19 (×10): qty 1

## 2024-02-19 MED ORDER — INSULIN ASPART 100 UNIT/ML IJ SOLN
0.0000 [IU] | Freq: Three times a day (TID) | INTRAMUSCULAR | Status: DC
  Administered 2024-02-19: 3 [IU] via SUBCUTANEOUS
  Administered 2024-02-19: 5 [IU] via SUBCUTANEOUS
  Administered 2024-02-20 (×3): 3 [IU] via SUBCUTANEOUS
  Administered 2024-02-21: 5 [IU] via SUBCUTANEOUS
  Administered 2024-02-21: 8 [IU] via SUBCUTANEOUS
  Administered 2024-02-21: 3 [IU] via SUBCUTANEOUS
  Administered 2024-02-22: 2 [IU] via SUBCUTANEOUS
  Administered 2024-02-22 – 2024-02-23 (×2): 3 [IU] via SUBCUTANEOUS
  Administered 2024-02-23 – 2024-02-25 (×4): 2 [IU] via SUBCUTANEOUS
  Administered 2024-02-25: 3 [IU] via SUBCUTANEOUS
  Administered 2024-02-25 – 2024-02-26 (×3): 2 [IU] via SUBCUTANEOUS
  Administered 2024-02-27: 3 [IU] via SUBCUTANEOUS

## 2024-02-19 MED ORDER — INSULIN ASPART 100 UNIT/ML IJ SOLN: 0.0000 [IU] | Freq: Every day | INTRAMUSCULAR | Status: DC

## 2024-02-19 NOTE — Progress Notes (Signed)
 Speech Language Pathology Treatment: Dysphagia  Patient Details Name: DEMERIUS PODOLAK MRN: [id number] DOB: April 26, 1951 Today's Date: 02/19/2024 Time: 8657-8469 SLP Time Calculation (min) (ACUTE ONLY): 13 min  Assessment / Plan / Recommendation Clinical Impression  Pt's mentation appears to have improved from prior session. He was verbally responsive to therapist without delays and allowed oral care (no dried secretions removed). He was receptive to teaspoon, cup sips of water  with decreased labial seal and spillage. There is concern for decreased airway protection with immediate coughs with water . Swallows with applesauce appeared prompt without s/s aspiration. Pt eager to eat and requesting po's. He may have ice chips after oral care. Plan on MBS tomorrow. He has a larger bore NGT and have requested that it be exchanged to [employer].    HPI HPI: Mr. Ogawa is a 73 yo male with who was brought to ED after being found down at home. LKW 5/21, presented 5/24. Dx with rhabdo, SIRS, AKI, polypharmacy (multiple sedating meds for tx of depression and Parkinson's), multiple wounds of skin, transaminitis, facial contusion. PMH depression, Parkinson's disease (not on treatment), insomnia, DMII, GERD, RLS.      SLP Plan  Continue with current plan of care      Recommendations for follow up therapy are one component of a multi-disciplinary discharge planning process, led by the attending physician.  Recommendations may be updated based on patient status, additional functional criteria and insurance authorization.    Recommendations  Diet recommendations: Other(comment) (ice chips after oral care) Medication Administration: Via alternative means                  Oral care QID   Frequent or constant Supervision/Assistance Dysphagia, oropharyngeal phase (R13.12)     Continue with current plan of care     Naomia Bachelor  02/19/2024, 8:43 AM

## 2024-02-19 NOTE — Evaluation (Signed)
 Physical Therapy Evaluation Patient Details Name: Douglas Price MRN: [id number] DOB: [birth year] Today's Date: 02/19/2024  History of Present Illness  Patient is a 73 yo male presenting to the ED after being found down for an unknown period of time on 02/14/24. Admitted to with SIRS, AKI, rhabdomyolysis. PMH includes: DM, restless leg, Parkinson's, spinal stenosis, diverticulosis  Clinical Impression  Pt is presenting below baseline level of functioning. Currently pt was a total A for bed mobility requiring physical assist in order to initiate movements. Pt was 2 person Mod A for sit to stand and transfers from elevated EOB with RW. Due to pt current functional status, home set up and available assistance at home recommending skilled physical therapy services < 3 hours/day in order to address strength, balance and functional mobility to decrease risk for falls, injury, immobility, skin break down and re-hospitalization.        If plan is discharge home, recommend the following: Two people to help with walking and/or transfers;Assistance with cooking/housework;Assist for transportation;Help with stairs or ramp for entrance   Can travel by private vehicle   No    Equipment Recommendations Wheelchair cushion (measurements PT);Hospital bed;Wheelchair (measurements PT);BSC/3in1     Functional Status Assessment Patient has had a recent decline in their functional status and demonstrates the ability to make significant improvements in function in a reasonable and predictable amount of time.     Precautions / Restrictions Precautions Precautions: Fall Recall of Precautions/Restrictions: Impaired Precaution/Restrictions Comments: pt has poor safety awareness. Restrictions Weight Bearing Restrictions Per Provider Order: No      Mobility  Bed Mobility Overal bed mobility: Needs Assistance Bed Mobility: Supine to Sit, Sit to Supine     Supine to sit: Max assist, +2 for safety/equipment,  +2 for physical assistance Sit to supine: Max assist, +2 for physical assistance, +2 for safety/equipment   General bed mobility comments: Pt had difficulty initially following directions for sequencing with poor intiation for movement. Pt with heavy posterior and R lean. Able to improve intermittently with multi modal cues.    Transfers Overall transfer level: Needs assistance Equipment used: Rolling walker (2 wheels) Transfers: Sit to/from Stand, Bed to chair/wheelchair/BSC Sit to Stand: From elevated surface, Mod assist, +2 safety/equipment, +2 physical assistance   Step pivot transfers: Mod assist, +2 physical assistance, +2 safety/equipment       General transfer comment: Pt initially had no evidence of initiation of contraction at bil quads to assist in standing; EOB elevated and attempted a second time with initial momentum provided pt activated at quads and assisted to get to standing at 2 person Mod A with 2 person mod A for step pivot from EOB to recliner with verbal cues for sequencing and assist with wgt shifting at pelvis in order to initiation progressing foot towards chair. Pt required positioning at the feet in order to be in position to stand without heavily leaning toward the R.    Ambulation/Gait     Pre-gait activities: pt was able to march in place at EOB with Min A for balance and UE support on RW. Pt was able to take steps from EOB to recliner.       Balance Overall balance assessment: Needs assistance Sitting-balance support: Single extremity supported, Feet supported Sitting balance-Leahy Scale: Poor Sitting balance - Comments: Pt with heavy posterior and R lean requiring Max A to MIn A for sitting EOB with brief periods of very close SBA Postural control: Right lateral lean, Posterior lean Standing balance  support: Bilateral upper extremity supported, Reliant on assistive device for balance Standing balance-Leahy Scale: Poor Standing balance comment: 2  person Min A in static standing and 2 person Mod A for functional transfers         Pertinent Vitals/Pain Pain Assessment Pain Assessment: No/denies pain    Home Living Family/patient expects to be discharged to:: Private residence Living Arrangements: Alone Available Help at Discharge: Friend(s);Available 24 hours/day (Friend Sharin David can help 24/7 if needed) Type of Home: House (town home) Home Access: Level entry     Alternate Level Stairs-Number of Steps: flight Home Layout: Two level;1/2 bath on main level Home Equipment: Rollator (4 wheels) Additional Comments: per sister pt has a second floor in his home.    Prior Function Prior Level of Function : Independent/Modified Independent;Working/employed             Mobility Comments: has rollator at work ADLs Comments: mod I to Ind for ADL's and IADL's.     Extremity/Trunk Assessment   Upper Extremity Assessment Upper Extremity Assessment: Right hand dominant;Defer to OT evaluation    Lower Extremity Assessment Lower Extremity Assessment: Generalized weakness;RLE deficits/detail;LLE deficits/detail RLE Deficits / Details: less than 2+/5 strength noted, functionally able to assist with sit to stand. LLE Deficits / Details: less than 2+/5 strength noted, functionally able to assist with sit to stand.    Cervical / Trunk Assessment Cervical / Trunk Assessment: Kyphotic  Communication   Communication Communication: No apparent difficulties    Cognition Arousal: Alert Behavior During Therapy: WFL for tasks assessed/performed   PT - Cognitive impairments: Awareness, Problem solving, Safety/Judgement, Sequencing, Initiation       PT - Cognition Comments: possible cognitive impairments, pt initially stated he has a one floor home with no steps. Sister arrived and states pt lives in a 2 story town home with a flight of steps. Full bathroom is upstairs. Following commands: Impaired Following commands impaired: Follows  one step commands inconsistently, Follows one step commands with increased time     Cueing Cueing Techniques: Verbal cues, Tactile cues, Visual cues     General Comments General comments (skin integrity, edema, etc.): Sister present during session. Pt has scrapes up and down legs. No noted skin issues on posterior scalp or back        Assessment/Plan    PT Assessment Patient needs continued PT services  PT Problem List Decreased strength;Decreased activity tolerance;Decreased balance;Decreased mobility;Decreased safety awareness       PT Treatment Interventions DME instruction;Balance training;Gait training;Stair training;Functional mobility training;Therapeutic activities;Therapeutic exercise;Patient/family education;Neuromuscular re-education    PT Goals (Current goals can be found in the Care Plan section)  Acute Rehab PT Goals Patient Stated Goal: to improve mobility PT Goal Formulation: With patient/family Time For Goal Achievement: 03/04/24 Potential to Achieve Goals: Good    Frequency Min 2X/week     Co-evaluation PT/OT/SLP Co-Evaluation/Treatment: Yes Reason for Co-Treatment: Complexity of the patient's impairments (multi-system involvement);Necessary to address cognition/behavior during functional activity;For patient/therapist safety;To address functional/ADL transfers PT goals addressed during session: Mobility/safety with mobility;Balance OT goals addressed during session: ADL's and self-care;Proper use of Adaptive equipment and DME       AM-PAC PT "6 Clicks" Mobility  Outcome Measure Help needed turning from your back to your side while in a flat bed without using bedrails?: Total Help needed moving from lying on your back to sitting on the side of a flat bed without using bedrails?: Total Help needed moving to and from a bed to a chair (  including a wheelchair)?: Total Help needed standing up from a chair using your arms (e.g., wheelchair or bedside chair)?:  Total Help needed to walk in hospital room?: Total Help needed climbing 3-5 steps with a railing? : Total 6 Click Score: 6    End of Session Equipment Utilized During Treatment: Gait belt Activity Tolerance: Patient tolerated treatment well;Patient limited by fatigue Patient left: in chair;with call bell/phone within reach;with chair alarm set Nurse Communication: Mobility status PT Visit Diagnosis: Unsteadiness on feet (R26.81);Other abnormalities of gait and mobility (R26.89);Muscle weakness (generalized) (M62.81);History of falling (Z91.81)    Time: 6295-2841 PT Time Calculation (min) (ACUTE ONLY): 40 min   Charges:   PT Evaluation $PT Eval Low Complexity: 1 Low   PT General Charges $$ ACUTE PT VISIT: 1 Visit       Sloan Duncans, DPT, CLT  Acute Rehabilitation Services Office: [phone] (Secure chat preferred)   Jenice Mitts 02/19/2024, 2:08 PM

## 2024-02-19 NOTE — Evaluation (Signed)
 Occupational Therapy Evaluation Patient Details Name: Douglas Price MRN: [id number] DOB: 15-Nov-1950 Today's Date: 02/19/2024   History of Present Illness   Patient is a 73 yo male presenting to the ED after being found down for an unknown period of time on 02/14/24. Admitted to with SIRS, AKI, rhabdomyolysis. PMH includes: DM, restless leg, Parkinson's, spinal stenosis, diverticulosis     Clinical Impressions Prior to this admission, patient living alone, and working as a Geologist, 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 Patient presents with multiple scrapes and abrasions on BLEs and feet. Patient pleasantly confused, and with profound weakness on top of Parkinsonian deficits. Patient with need for max- total A for bed mobility, however with rocking and counting, patient able to stand with PT and OT and RW with mod A of 2 and complete step pivot. Patient would benefit from intensive rehab < 3 hours in order to return to prior level. OT will continue to follow.     If plan is discharge home, recommend the following:   Two people to help with walking and/or transfers;Two people to help with bathing/dressing/bathroom;Assistance with cooking/housework;Direct supervision/assist for medications management;Direct supervision/assist for financial management;Assist for transportation;Help with stairs or ramp for entrance;Supervision due to cognitive status;Assistance with feeding     Functional Status Assessment   Patient has had a recent decline in their functional status and demonstrates the ability to make significant improvements in function in a reasonable and predictable amount of time.     Equipment Recommendations   Other (comment) (defer to next venue)     Recommendations for Other Services         Precautions/Restrictions   Precautions Precautions: Fall Recall of  Precautions/Restrictions: Impaired Precaution/Restrictions Comments: pt has poor safety awareness. Restrictions Weight Bearing Restrictions Per Provider Order: No     Mobility Bed Mobility Overal bed mobility: Needs Assistance Bed Mobility: Supine to Sit, Sit to Supine     Supine to sit: Max assist, +2 for safety/equipment, +2 for physical assistance Sit to supine: Max assist, +2 for physical assistance, +2 for safety/equipment   General bed mobility comments: Pt had difficulty initially following directions for sequencing with poor intiation for movement. Pt with heavy posterior and R lean. Able to improve intermittently with multi modal cues.    Transfers Overall transfer level: Needs assistance Equipment used: Rolling walker (2 wheels) Transfers: Sit to/from Stand, Bed to chair/wheelchair/BSC Sit to Stand: From elevated surface, Mod assist, +2 safety/equipment, +2 physical assistance     Step pivot transfers: Mod assist, +2 physical assistance, +2 safety/equipment     General transfer comment: Pt initially had no evidence of initiation of contraction at bil quads to assist in standing; EOB elevated and attempted a second time with initial momentum provided pt activated at quads and assisted to get to standing at 2 person Mod A with 2 person mod A for step pivot from EOB to recliner with verbal cues for sequencing and assist with wgt shifting at pelvis in order to initiation progressing foot towards chair. Pt required positioning at the feet in order to be in position to stand without heavily leaning toward the R.      Balance Overall balance assessment: Needs assistance Sitting-balance support: Single extremity supported, Feet supported Sitting balance-Leahy Scale: Poor Sitting balance - Comments: Pt with heavy posterior and R lean requiring Max A to MIn A for sitting EOB with brief  periods of very close SBA Postural control: Right lateral lean, Posterior lean Standing balance  support: Bilateral upper extremity supported, Reliant on assistive device for balance Standing balance-Leahy Scale: Poor Standing balance comment: 2 person Min A in static standing and 2 person Mod A for functional transfers                           ADL either performed or assessed with clinical judgement   ADL Overall ADL's : Needs assistance/impaired Eating/Feeding: Maximal assistance;Sitting   Grooming: Maximal assistance;Sitting   Upper Body Bathing: Maximal assistance;Total assistance;Sitting   Lower Body Bathing: Total assistance;+2 for safety/equipment;+2 for physical assistance;Sitting/lateral leans;Sit to/from stand   Upper Body Dressing : Maximal assistance;Standing   Lower Body Dressing: Total assistance;Sitting/lateral leans;Sit to/from stand   Toilet Transfer: Moderate assistance;+2 for physical assistance;+2 for safety/equipment;Stand-pivot;BSC/3in1 Toilet Transfer Details (indicate cue type and reason): simulated with stand pivot to recliner Toileting- Clothing Manipulation and Hygiene: Total assistance;Bed level Toileting - Clothing Manipulation Details (indicate cue type and reason): catheter and flexi-seal     Functional mobility during ADLs: Maximal assistance;+2 for physical assistance;+2 for safety/equipment;Cueing for safety;Cueing for sequencing;Rolling walker (2 wheels) General ADL Comments: Prior to this admission, patient living alone, and working as a Geologist, 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 Patient presents with multiple scrapes and abrasions on BLEs and feet. Patient pleasantly confused, and with profound weakness on top of Parkinsonian deficits. Patient with need for max- total A for bed mobility, however with rocking and counting, patient able to stand with PT and OT and RW with mod A of 2 and complete step pivot. Patient would benefit from  intensive rehab < 3 hours in order to return to prior level. OT will continue to follow.     Vision Baseline Vision/History: 1 Wears glasses Ability to See in Adequate Light: 0 Adequate Patient Visual Report: No change from baseline Vision Assessment?: No apparent visual deficits     Perception Perception: Impaired Preception Impairment Details: Body Scheme     Praxis Praxis: Impaired Praxis Impairment Details: Initiation, Motor planning, Organization     Pertinent Vitals/Pain Pain Assessment Pain Assessment: Faces Pain Score: 0-No pain Pain Intervention(s): Limited activity within patient's tolerance, Monitored during session, Repositioned     Extremity/Trunk Assessment Upper Extremity Assessment Upper Extremity Assessment: Right hand dominant;RUE deficits/detail;LUE deficits/detail RUE Deficits / Details: edema and significant deficits with active movements, flexed position, poor grip, 2/5 RUE Sensation: decreased light touch RUE Coordination: decreased fine motor;decreased gross motor LUE Deficits / Details: edema and significant deficits with active movements, flexed position, poor grip, 2/5 LUE Sensation: decreased light touch LUE Coordination: decreased fine motor;decreased gross motor   Lower Extremity Assessment Lower Extremity Assessment: Defer to PT evaluation   Cervical / Trunk Assessment Cervical / Trunk Assessment: Kyphotic   Communication Communication Communication: No apparent difficulties   Cognition Arousal: Alert Behavior During Therapy: WFL for tasks assessed/performed Cognition: Cognition impaired     Awareness: Online awareness impaired Memory impairment (select all impairments): Working Civil Service fast streamer, Conservation officer, historic buildings Attention impairment (select first level of impairment): Sustained attention Executive functioning impairment (select all impairments): Initiation, Organization, Sequencing, Reasoning, Problem solving OT - Cognition  Comments: Patient stating he had a one level house and no falls. Per sister and mulitple bruises and abraisions patient has had numerous falls. Patient also lives in a two story  home with a steep staircase to reach his bedroom. Patient also with delayed responses and initiaiton, which is likely due to his Parkinson's. Per sister, patient has been declining for a while and is hesistant to accept help.                 Following commands: Impaired Following commands impaired: Follows one step commands inconsistently, Follows one step commands with increased time     Cueing  General Comments   Cueing Techniques: Verbal cues;Tactile cues;Visual cues  Sister present during session. Pt has scrapes up and down legs. No noted skin issues on posterior scalp or back   Exercises     Shoulder Instructions      Home Living Family/patient expects to be discharged to:: Private residence Living Arrangements: Alone Available Help at Discharge: Friend(s);Available 24 hours/day (per patient, family friend Sharin David can help if needed) Type of Home: House (townhome) Home Access: Level entry     Home Layout: Two level;1/2 bath on main level Alternate Level Stairs-Number of Steps: flight Alternate Level Stairs-Rails: Right Bathroom Shower/Tub: Producer, television/film/video: Standard Bathroom Accessibility: Yes   Home Equipment: Rollator (4 wheels)   Additional Comments: per sister pt has a second floor in his home.      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, and sister provides meals 6 weeks at a time, poor safety awareness    OT Problem List: Decreased strength;Decreased range of motion;Decreased activity tolerance;Impaired  balance (sitting and/or standing);Decreased coordination;Decreased cognition;Decreased safety awareness;Impaired sensation;Impaired UE functional use;Pain;Increased edema;Decreased knowledge of use of DME or AE;Decreased knowledge of precautions   OT Treatment/Interventions: Self-care/ADL training;Therapeutic exercise;Energy conservation;DME and/or AE instruction;Manual therapy;Therapeutic activities;Balance training;Patient/family education;Cognitive remediation/compensation;Modalities;Neuromuscular education      OT Goals(Current goals can be found in the care plan section)   Acute Rehab OT Goals Patient Stated Goal: to get better OT Goal Formulation: With patient/family Time For Goal Achievement: 03/04/24 Potential to Achieve Goals: Fair ADL Goals Pt Will Perform Eating: with contact guard assist;sitting;with adaptive utensils Pt Will Perform Grooming: with contact guard assist;sitting;with adaptive equipment Pt Will Perform Lower Body Bathing: with mod assist;sit to/from stand;sitting/lateral leans Pt Will Perform Lower Body Dressing: with mod assist;sit to/from stand;sitting/lateral leans Pt Will Transfer to Toilet: with mod assist;stand pivot transfer;bedside commode Pt Will Perform Toileting - Clothing Manipulation and hygiene: with mod assist;sitting/lateral leans;sit to/from stand Additional ADL Goal #1: Patient will be able to complete bed mobility at mod A level as a precursor to OOB activity. Additional ADL Goal #2: Patient will be able to follow 2 step commands consistently as a precursor to higher level cognitive tasks to return to prior level.   OT Frequency:  Min 2X/week    Co-evaluation   Reason for Co-Treatment: Complexity of the patient's impairments (multi-system involvement);Necessary to address cognition/behavior during functional activity;For patient/therapist safety;To address functional/ADL transfers PT goals addressed during session: Mobility/safety with  mobility;Balance OT goals addressed during session: ADL's and self-care;Proper use of Adaptive equipment and DME      AM-PAC OT "6 Clicks" Daily Activity     Outcome Measure Help from another person eating meals?: A Lot Help from another person taking care of personal grooming?: A Lot Help from another person  toileting, which includes using toliet, bedpan, or urinal?: Total Help from another person bathing (including washing, rinsing, drying)?: A Lot Help from another person to put on and taking off regular upper body clothing?: A Lot Help from another person to put on and taking off regular lower body clothing?: Total 6 Click Score: 10   End of Session Equipment Utilized During Treatment: Gait belt;Rolling walker (2 wheels) Nurse Communication: Mobility status;Need for lift equipment Octaviano Belts)  Activity Tolerance: Patient limited by fatigue Patient left: in chair;with call bell/phone within reach;with chair alarm set;with family/visitor present  OT Visit Diagnosis: Unsteadiness on feet (R26.81);Other abnormalities of gait and mobility (R26.89);Repeated falls (R29.6);Muscle weakness (generalized) (M62.81);History of falling (Z91.81);Other symptoms and signs involving cognitive function                Time: 1610-9604 OT Time Calculation (min): 38 min Charges:  OT General Charges $OT Visit: 1 Visit OT Evaluation $OT Eval Moderate Complexity: 1 Mod OT Treatments $Self Care/Home Management : 8-22 mins  Mollie Anger E. Patricie Geeslin, OTR/L Acute Rehabilitation Services [phone]   Vincent Greek 02/19/2024, 4:22 PM

## 2024-02-19 NOTE — Progress Notes (Signed)
 PROGRESS NOTE    Douglas Price  ZOX:[id number] DOB: [birth year] DOA: 02/14/2024 PCP: Lonzie Robins, MD   Brief Narrative: [name] is a 73 yo male with PMH depression, Parkinson's disease (not on treatment), insomnia, DMII, GERD, RLS who was brought to the hospital after being found on the floor at home soiled in urine. He had gone to work on Wednesday which was the last time he was reported to be in his normal state.  He typically ambulates with a walker at baseline and works at a school (chaperones students to various classrooms). A well check was performed on Friday after he didn't show up to school Thursday and Friday.  His sister thinks he may have been sitting on the edge of bed and leaned forward losing his balance and falling onto the ground.  Unknown whether he passed out before the fall or hit his face causing him to pass out afterwards. He underwent workup on admission and was found to be febrile with significant rhabdomyolysis and AKI. He was admitted for further workup.     Assessment & Plan:   Principal Problem:   SIRS (systemic inflammatory response syndrome) (HCC) Active Problems:   Rhabdomyolysis   AKI (acute kidney injury) (HCC)   Dysphagia   Acute urinary retention   Parkinson's disease (HCC)   Hypernatremia   Multiple wounds of skin   Transaminitis   Fall at home, initial encounter   Facial contusion   Gait disturbance   Polypharmacy   Conjunctivitis   Diabetic polyneuropathy associated with type 2 diabetes mellitus (HCC)   Depression   Insomnia   BPH (benign prostatic hyperplasia)   GERD (gastroesophageal reflux disease)   Hypokalemia  1-SIRS - Patient presented with fever tachycardia, leukocytosis. - Had elevated lactic acid which was considered in the setting of volume depletion in the setting of rhabdo. - UA and chest x-ray unremarkable on admission. - Due to persistent leukocytosis he was started on Unasyn to cover for presumed aspiration  pneumonia. - White blood cells and procalcitonin started to decrease after he was started on Unasyn - 5/28: Continued to spike fever, repeated blood cultures, UA, chest x-ray.  Chest x-ray showed right basilar atelectasis.  I have added doxycycline to antibiotics regimen  AKI - Presented with a creatinine of 1.5 in the setting of rhabdomyolysis.  Creatinine  baseline 0.9 - Improving with IV fluids   Rhabdomyolysis In the setting of fall and immobilization.  Presented with a CK level of 19,000----down to 3675 Continue with IV fluids, monitor electrolytes  Acute urinary retention: - Continue Foley catheter.   "Developed urinary retention worse on 02/17/2024 and Foley catheter placed; required coud cath due to difficulty with placement initially so would not remove until regains much more strength and cognition "  Dysphagia - Evaluated by speech and recommended n.p.o. status -Core track tube replaced 5/28 -For modified barium swallow tomorrow  Hypernatremia; In the setting of dehydration rhabdomyolysis Sodium is still at 160.  I have changed IV fluids to D5.  Will need to monitor CBG and cover with insulin as needed. Will increase free water to every 4 hours from every 6 hours today Repeat BP meds this afternoon  Parkinson's disease - Patient follows with Dr. Arabella Beach. - Has not been on treatment due to not benefit from levo [place] and noted DBS candidate due to underlying severe depression - He was having worsening tremors in the setting of acute illness, Dr. Gladstone Lamer discussed with neurology and patient was started on  a trial of Sinemet  and low-dose scheduled Ativan Will  change Ativan to as needed to avoid oversedation.   Conjunctivitis:  - Continue trial of erythromycin  Polypharmacy On multiple sedating medications for treatment of his depression and Parkinson's along with insomnia; concerned that this contributed to his fall -Medications include Wellbutrin , Klonopin , Cymbalta ,  Norco, Remeron, Lyrica , Restoril . - Currently unable to receive Wellbutrin , Cymbalta  because unable to crush medication  Gait disturbance - Multifactorial from underlying Parkinson's and deconditioning  Facial contusion - No acute fracture on trauma workup. Soft tissue contusion over the right orbit and forehead   Fall at home, initial encounter - Severely deconditioned - PT/OT ordered now that he is clinically improving   Transaminitis - Presumed from rhabdo - Continue trending   Multiple wounds of skin - No obvious signs of cellulitis - Continue wound care as per WOC nurse   Diabetic polyneuropathy associated with type 2 diabetes mellitus (HCC) - On Lyrica  at home   Depression - Follows with psychiatry outpatient, notes reviewed - Lithium  level low (0.11) and he endorses compliance -Continue lithium , Remeron, Restoril , Cymbalta , Wellbutrin    Insomnia Continue Restoril  and Remeron   BPH (benign prostatic hyperplasia) - Continue Flomax  Currently has Foley catheter placed   GERD (gastroesophageal reflux disease) - Continue Protonix    Hypokalemia - Replete as needed  Diarrhea: Denies abdominal pain, likely in the setting of tube feeding and free water   Nutrition Problem: Inadequate oral intake Etiology: dysphagia, inability to eat    Signs/Symptoms: NPO status    Interventions: Tube feeding, MVI  Estimated body mass index is 24.69 kg/m as calculated from the following:   Height as of this encounter: 6\' 3"  (1.905 m).   Weight as of this encounter: 89.6 kg.   DVT prophylaxis: Heparin Code Status: Full code Family Communication: No family at bedside Disposition Plan:  Status is: Inpatient Remains inpatient appropriate because: Awaiting clinical improvement    Consultants:  None Procedures:  None  Antimicrobials:    Subjective: Patient seems to be more awake today, he is able to answer a few question.  He thinks he fell at home.  He denies  any abdominal pain. He appears very deconditioning and weak Objective: Vitals:   02/18/24 1631 02/18/24 1954 02/19/24 0043 02/19/24 0402  BP: (!) 150/82 (!) 167/74  (!) 157/83  Pulse: 77 70  70  Resp: (!) 22 18 (!) 22 19  Temp: (!) 100.8 F (38.2 C) (!) 101.2 F (38.4 C) 98.8 F (37.1 C) 99 F (37.2 C)  TempSrc: Oral Oral Axillary Axillary  SpO2: 98% 99%  97%  Weight:      Height:        Intake/Output Summary (Last 24 hours) at 02/19/2024 0804 Last data filed at 02/19/2024 0432 Gross per 24 hour  Intake 4074.12 ml  Output 2000 ml  Net 2074.12 ml   Filed Weights   02/16/24 2100  Weight: 89.6 kg    Examination:  General exam: Appears calm and comfortable  Respiratory system: Clear to auscultation. Respiratory effort normal. Cardiovascular system: S1 & S2 heard, RRR. Gastrointestinal system: Abdomen is nondistended, soft and nontender. No organomegaly or masses felt. Normal bowel sounds heard. Central nervous system: Alert and oriented.  Generalized weakness, answer questions Extremities: Symmetric 5 x 5 power. Skin: Has multiple skin abrasions     Data Reviewed: I have personally reviewed following labs and imaging studies  CBC: Recent Labs  Lab 02/14/24 1125 02/15/24 8657 02/16/24 0701 02/17/24 8469 02/18/24 6295  02/19/24 0550  WBC 20.4* 20.4* 18.0* 17.3* 18.9* 17.9*  NEUTROABS 18.4*  --  16.1* 14.9* 15.6* 15.2*  HGB 14.0 12.9* 13.0 13.5 13.4 13.0  HCT 41.9 39.1 39.9 42.0 42.6 42.7  MCV 91.1 92.4 93.7 95.2 99.8 102.2*  PLT 249 227 228 202 198 164   Basic Metabolic Panel: Recent Labs  Lab 02/16/24 0701 02/17/24 0846 02/18/24 0529 02/18/24 0958 02/18/24 1754 02/19/24 0550  NA 151* 155* 161* 161* 160* 161*  K 3.3* 3.3* 3.2* 3.2* 3.5 3.6  CL 115* 120* 125* 128* 126* 124*  CO2 22 25 28 30 27 26   GLUCOSE 157* 191* 210* 187* 187* 180*  BUN 46* 38* 30* 29* 26* 25*  CREATININE 1.39* 1.15 1.23 1.11 1.04 0.92  CALCIUM 8.1* 8.0* 7.8* 7.5* 7.6* 7.6*  MG  2.0 2.1 2.2  --   --  2.0   GFR: Estimated Creatinine Clearance: 89.3 mL/min (by C-G formula based on SCr of 0.92 mg/dL). Liver Function Tests: Recent Labs  Lab 02/15/24 0711 02/16/24 0701 02/17/24 0846 02/18/24 0529 02/19/24 0550  AST 502* 472* 333* 252* 194*  ALT 161* 198* 217* 131* 135*  ALKPHOS 45 42 41 36* 41  BILITOT 2.2* 2.0* 1.2 1.0 1.1  PROT 6.1* 5.7* 5.5* 5.3* 5.0*  ALBUMIN  2.9* 2.8* 2.6* 2.5* 2.3*   No results for input(s): "LIPASE", "AMYLASE" in the last 168 hours. No results for input(s): "AMMONIA" in the last 168 hours. Coagulation Profile: Recent Labs  Lab 02/14/24 1125  INR 1.4*   Cardiac Enzymes: Recent Labs  Lab 02/15/24 0711 02/16/24 0701 02/17/24 0846 02/18/24 0529 02/19/24 0550  CKTOTAL 18,805* 16,301* 11,709* 8,337* 3,675*   BNP (last 3 results) No results for input(s): "PROBNP" in the last 8760 hours. HbA1C: No results for input(s): "HGBA1C" in the last 72 hours. CBG: Recent Labs  Lab 02/18/24 0637 02/18/24 1217 02/18/24 1828 02/19/24 0042 02/19/24 0628  GLUCAP 173* 183* 173* 173* 177*   Lipid Profile: No results for input(s): "CHOL", "HDL", "LDLCALC", "TRIG", "CHOLHDL", "LDLDIRECT" in the last 72 hours. Thyroid Function Tests: No results for input(s): "TSH", "T4TOTAL", "FREET4", "T3FREE", "THYROIDAB" in the last 72 hours. Anemia Panel: No results for input(s): "VITAMINB12", "FOLATE", "FERRITIN", "TIBC", "IRON", "RETICCTPCT" in the last 72 hours. Sepsis Labs: Recent Labs  Lab 02/14/24 1134 02/14/24 1553 02/16/24 0701 02/17/24 0846 02/18/24 0529  PROCALCITON  --   --  3.42 1.41 0.72  LATICACIDVEN 1.7 3.4*  --   --   --     Recent Results (from the past 240 hours)  Culture, blood (Routine x 2)     Status: None (Preliminary result)   Collection Time: 02/14/24 11:31 AM   Specimen: BLOOD RIGHT HAND  Result Value Ref Range Status   Specimen Description BLOOD RIGHT HAND  Final   Special Requests   Final    BOTTLES DRAWN  AEROBIC AND ANAEROBIC Blood Culture adequate volume   Culture   Final    NO GROWTH 4 DAYS Performed at [facility], [address] [place], [place] [address]    Report Status PENDING  Incomplete  Culture, blood (Routine x 2)     Status: None (Preliminary result)   Collection Time: 02/14/24 11:36 AM   Specimen: BLOOD  Result Value Ref Range Status   Specimen Description BLOOD SITE NOT SPECIFIED  Final   Special Requests   Final    BOTTLES DRAWN AEROBIC ONLY Blood Culture results may not be optimal due to an inadequate volume  of blood received in culture bottles   Culture   Final    NO GROWTH 4 DAYS Performed at [facility], [address] [place], [place] [address]    Report Status PENDING  Incomplete  MRSA Next Gen by PCR, Nasal     Status: None   Collection Time: 02/15/24 12:19 AM   Specimen: Nasal Mucosa; Nasal Swab  Result Value Ref Range Status   MRSA by PCR Next Gen NOT DETECTED NOT DETECTED Final    Comment: (NOTE) The GeneXpert MRSA Assay (FDA approved for NASAL specimens only), is one component of a comprehensive MRSA colonization surveillance program. It is not intended to diagnose MRSA infection nor to guide or monitor treatment for MRSA infections. Test performance is not FDA approved in patients less than 31 years old. Performed at [facility], [address] [place], [place] [address]          Radiology Studies: No results found.      Scheduled Meds:  buPROPion   150 mg Per Tube BID   carbidopa -levodopa   1 tablet Per Tube TID   Chlorhexidine  Gluconate Cloth  6 each Topical Daily   DULoxetine   120 mg Oral Daily   dutasteride   0.5 mg Oral Daily   erythromycin   Both Eyes QID   feeding supplement (PROSource TF20)  60 mL Per Tube Daily   free water  400 mL Per Tube Q6H   Gerhardt's butt cream   Topical BID   heparin  5,000 Units Subcutaneous Q8H   insulin aspart  0-15 Units Subcutaneous TID WC   insulin aspart  0-5 Units  Subcutaneous QHS   lidocaine   1 Application Urethral Once   lithium  carbonate  150 mg Per Tube QHS   LORazepam  0.5 mg Intravenous TID   mirtazapine  15 mg Per Tube QHS   multivitamin with minerals  1 tablet Per Tube Daily   mupirocin  ointment   Topical BID   nebivolol  2.5 mg Per Tube Daily   pantoprazole  (PROTONIX ) IV  40 mg Intravenous Q24H   pregabalin   150 mg Per Tube BID   sodium chloride  flush  3 mL Intravenous Q12H   Continuous Infusions:  ampicillin-sulbactam (UNASYN) IV 3 g (02/19/24 0508)   dextrose      feeding supplement (JEVITY 1.2 CAL) 75 mL/hr at 02/19/24 0432     LOS: 5 days    Time spent: 35 minutes    Keajah Killough A Alaynah Schutter, MD [facility]   If 7PM-7AM, please contact night-coverage [email or web address]  02/19/2024, 8:04 AM

## 2024-02-19 NOTE — Procedures (Signed)
 Cortrak  Person Inserting Tube:  Brenita Callow, Emalee Knies L, RD Tube Type:  Cortrak - 43 inches Tube Size:  10 Tube Location:  Left nare Initial Placement:  Stomach Secured by: Bridle Technique Used to Measure Tube Placement:  Marking at nare/corner of mouth Cortrak Secured At:  68 cm   Cortrak Tube Team Note:  Consult received to place a Cortrak feeding tube.   No x-ray is required. RN may begin using tube.   If the tube becomes dislodged please keep the tube and contact the Cortrak team at [email or web address] for replacement.  If after hours and replacement cannot be delayed, place a NG tube and confirm placement with an abdominal x-ray.    Doneta Furbish RD, LDN Clinical Dietitian

## 2024-02-20 ENCOUNTER — Inpatient Hospital Stay (HOSPITAL_COMMUNITY)

## 2024-02-20 ENCOUNTER — Telehealth: Payer: Self-pay | Admitting: Psychiatry

## 2024-02-20 ENCOUNTER — Encounter (HOSPITAL_COMMUNITY): Payer: Self-pay | Admitting: Internal Medicine

## 2024-02-20 DIAGNOSIS — J690 Pneumonitis due to inhalation of food and vomit: Secondary | ICD-10-CM

## 2024-02-20 DIAGNOSIS — G20A1 Parkinson's disease without dyskinesia, without mention of fluctuations: Secondary | ICD-10-CM | POA: Diagnosis not present

## 2024-02-20 DIAGNOSIS — E870 Hyperosmolality and hypernatremia: Secondary | ICD-10-CM | POA: Diagnosis not present

## 2024-02-20 DIAGNOSIS — R6510 Systemic inflammatory response syndrome (SIRS) of non-infectious origin without acute organ dysfunction: Secondary | ICD-10-CM | POA: Diagnosis not present

## 2024-02-20 LAB — CBC WITH DIFFERENTIAL/PLATELET
Abs Immature Granulocytes: 0.2 10*3/uL — ABNORMAL HIGH (ref 0.00–0.07)
Basophils Absolute: 0 10*3/uL (ref 0.0–0.1)
Basophils Relative: 0 %
Eosinophils Absolute: 0.6 10*3/uL — ABNORMAL HIGH (ref 0.0–0.5)
Eosinophils Relative: 5 %
HCT: 39.7 % (ref 39.0–52.0)
Hemoglobin: 12.3 g/dL — ABNORMAL LOW (ref 13.0–17.0)
Immature Granulocytes: 2 %
Lymphocytes Relative: 11 %
Lymphs Abs: 1.5 10*3/uL (ref 0.7–4.0)
MCH: 30.9 pg (ref 26.0–34.0)
MCHC: 31 g/dL (ref 30.0–36.0)
MCV: 99.7 fL (ref 80.0–100.0)
Monocytes Absolute: 0.7 10*3/uL (ref 0.1–1.0)
Monocytes Relative: 5 %
Neutro Abs: 10.6 10*3/uL — ABNORMAL HIGH (ref 1.7–7.7)
Neutrophils Relative %: 77 %
Platelets: 151 10*3/uL (ref 150–400)
RBC: 3.98 MIL/uL — ABNORMAL LOW (ref 4.22–5.81)
RDW: 14.2 % (ref 11.5–15.5)
WBC: 13.7 10*3/uL — ABNORMAL HIGH (ref 4.0–10.5)
nRBC: 0.1 % (ref 0.0–0.2)

## 2024-02-20 LAB — GLUCOSE, CAPILLARY
Glucose-Capillary: 187 mg/dL — ABNORMAL HIGH (ref 70–99)
Glucose-Capillary: 170 mg/dL — ABNORMAL HIGH (ref 70–99)
Glucose-Capillary: 179 mg/dL — ABNORMAL HIGH (ref 70–99)
Glucose-Capillary: 227 mg/dL — ABNORMAL HIGH (ref 70–99)

## 2024-02-20 LAB — COMPREHENSIVE METABOLIC PANEL WITH GFR
ALT: 98 U/L — ABNORMAL HIGH (ref 0–44)
AST: 121 U/L — ABNORMAL HIGH (ref 15–41)
Albumin: 2 g/dL — ABNORMAL LOW (ref 3.5–5.0)
Alkaline Phosphatase: 40 U/L (ref 38–126)
Anion gap: 11 (ref 5–15)
BUN: 24 mg/dL — ABNORMAL HIGH (ref 8–23)
CO2: 25 mmol/L (ref 22–32)
Calcium: 7.2 mg/dL — ABNORMAL LOW (ref 8.9–10.3)
Chloride: 116 mmol/L — ABNORMAL HIGH (ref 98–111)
Creatinine, Ser: 0.81 mg/dL (ref 0.61–1.24)
GFR, Estimated: >60 mL/min (ref >60)
Glucose, Bld: 238 mg/dL — ABNORMAL HIGH (ref 70–99)
Potassium: 3.5 mmol/L (ref 3.5–5.1)
Sodium: 152 mmol/L — ABNORMAL HIGH (ref 135–145)
Total Bilirubin: 0.8 mg/dL (ref 0.0–1.2)
Total Protein: 4.4 g/dL — ABNORMAL LOW (ref 6.5–8.1)

## 2024-02-20 LAB — MAGNESIUM: Magnesium: 1.9 mg/dL (ref 1.7–2.4)

## 2024-02-20 LAB — CK: Total CK: 1584 U/L — ABNORMAL HIGH (ref 49–397)

## 2024-02-20 MED ORDER — JEVITY 1.5 CAL/FIBER PO LIQD
1000.0000 mL | ORAL | Status: DC
  Administered 2024-02-20: 1000 mL
  Filled 2024-02-20 (×3): qty 1000

## 2024-02-20 MED ORDER — JEVITY 1.5 CAL/FIBER PO LIQD: 1000.0000 mL | ORAL | Status: DC

## 2024-02-20 MED ORDER — DEXTROSE 5 % IV SOLN: INTRAVENOUS | Status: DC

## 2024-02-20 MED ORDER — FREE WATER
200.0000 mL | Status: DC
  Administered 2024-02-20 – 2024-02-21 (×11): 200 mL

## 2024-02-20 MED ORDER — INSULIN GLARGINE-YFGN 100 UNIT/ML ~~LOC~~ SOLN
10.0000 [IU] | Freq: Every day | SUBCUTANEOUS | Status: DC
  Administered 2024-02-20 – 2024-02-27 (×8): 10 [IU] via SUBCUTANEOUS
  Filled 2024-02-20 (×8): qty 0.1

## 2024-02-20 NOTE — TOC PASRR Note (Signed)
 30 Day PASRR Note   Patient Details  Name: Douglas Price Date of Birth: 04-10-1951   Transition of Care [facility]) CM/SW Contact:    Tandy Fam, LCSW Phone Number: 02/20/2024, 3:31 PM  To Whom It May Concern:  Please be advised that this patient will require a short-term nursing home stay - anticipated 30 days or less for rehabilitation and strengthening.   The plan is for return home.    TOC Dementia Note   Patient Details  Name: Douglas Price Date of Birth: July 23, 1951 02/20/2024, 3:32 PM   To Whom It May Concern:  Please be advised that the above-named patient has a primary diagnosis of dementia which supersedes any psychiatric diagnosis.   Transition of Care [facility]) CM/SW Contact: Tandy Fam, LCSW Phone Number: 02/20/2024, 3:32 PM

## 2024-02-20 NOTE — TOC Progression Note (Signed)
 Transition of Care [facility]) - Progression Note    Patient Details  Name: Douglas Price MRN: [id number] Date of Birth: 1950-10-14  Transition of Care [facility]) CM/SW Contact  Jonathan Neighbor, RN Phone Number: 02/20/2024, 3:19 PM  Clinical Narrative:     CM met with the patient and his brother at the bedside. Current recommendations are for SNF rehab. Both are in agreement. Pt asked to be faxed out in the [facility] area. Pt still with cortrak and will need to be taking Po well before d/c. Brother and pt aware. TOC will provide bed offers once available.  TOC following.  Expected Discharge Plan: Skilled Nursing Facility Barriers to Discharge: Continued Medical Work up  Expected Discharge Plan and Services   Discharge Planning Services: CM Consult Post Acute Care Choice: IP Rehab Living arrangements for the past 2 months: Apartment                                       Social Determinants of Health (SDOH) Interventions SDOH Screenings   Food Insecurity: No Food Insecurity (02/16/2024)  Housing: Low Risk  (02/16/2024)  Transportation Needs: No Transportation Needs (02/16/2024)  Utilities: Not At Risk (02/16/2024)  Depression (PHQ2-9): Medium Risk (02/11/2023)  Financial Resource Strain: Low Risk  (02/07/2023)  Social Connections: Moderately Isolated (02/16/2024)  Tobacco Use: High Risk (02/20/2024)    Readmission Risk Interventions     No data to display

## 2024-02-20 NOTE — Telephone Encounter (Signed)
 Admin note for non-service contact  Patient ID: Douglas Price  MRN: [id number] DATE: 02/20/2024  Call taken from brother Douglas Price, confirms [name] is recovering, set to come off feeding tube.  Will transition to rehab on the weekend, possibly tomorrow.  Foreseeable issues with placement, affordability, and disposing of his large collections at home, given strong suspicion he will need to be in some more assisted form of living.  Offer made to host family session if it's needed to help meet minds and is not provided elsewhere, say, by a nursing home social worker.  If insurance coverage is an issue, Douglas Price offers to pay, and well understood that any such meeting requires Bill's further consent.    Maretta Shaper, PhD Delora Ferry, PhD LP Clinical Psychologist, [facility] [address], [address] [place], [place] [address] [phone]

## 2024-02-20 NOTE — Evaluation (Signed)
 Modified Barium Swallow Study  Patient Details  Name: Douglas Price MRN: [id number] Date of Birth: [birth year]  Today's Date: 02/20/2024  Modified Barium Swallow completed.  Full report located under Chart Review in the Imaging Section.  History of Present Illness Douglas Price is a 73 yo male with who was brought to ED after being found down at home. LKW 5/21, presented 5/24. Dx with rhabdo, SIRS, AKI, polypharmacy (multiple sedating meds for tx of depression and Parkinson's), multiple wounds of skin, transaminitis, facial contusion. PMH depression, Parkinson's disease (not on treatment), insomnia, DMII, GERD, RLS.   Clinical Impression Pt demonstrates a mild oropharyngeal dysphagia with reliable airway protection (no penetration/aspiration), mild deficits in oral preparation with prolonged mastication (partials not present for study), trace oral residue post-swallow, mild reduction in base-of-tongue to pharyngeal wall contact, leading to solid food residue in valleculae without awareness.  Pill not tested due to concerns for potential lodging in vallecular space.   Recommend starting a dysphagia 3 diet/thin liquids; crush meds in puree.  SLP will follow. Results shared with RN and RD.  DIGEST Swallow Severity Rating*  Safety: 0  Efficiency: 1  Overall Pharyngeal Swallow Severity: 1-mild 1: mild; 2: moderate; 3: severe; 4: profound  *The Dynamic Imaging Grade of Swallowing Toxicity is standardized for the head and neck cancer population, however, demonstrates promising clinical applications across populations to standardize the clinical rating of pharyngeal swallow safety and severity.  Factors that may increase risk of adverse event in presence of aspiration Douglas Price & Douglas Price 2021):    Swallow Evaluation Recommendations Recommendations: PO diet PO Diet Recommendation: Dysphagia 3 (Mechanical soft);Thin liquids (Level 0) Liquid Administration via: Cup;Straw Medication Administration:  Crushed with puree Supervision: Full assist for feeding Oral care recommendations: Oral care BID (2x/day)    Douglas Price L. Douglas Lincoln, MA CCC/SLP Clinical Specialist - Acute Care SLP Acute Rehabilitation Services Office number [phone]   Douglas Price Douglas Price 02/20/2024,9:22 AM

## 2024-02-20 NOTE — Progress Notes (Signed)
 Nutrition Follow-up  DOCUMENTATION CODES:  Not applicable  INTERVENTION:  Continue current diet as ordered Encourage PO intake Ensure Plus High Protein po BID, each supplement provides 350 kcal and 20 grams of protein. Adjust TF to nocturnal Jevity 1.5 @ 22mL/h x 12 hours ( ) Free water  q2 hours This provides 1440kcal, 61g protein, and free water  (TF+flush = free water )  NUTRITION DIAGNOSIS:  Inadequate oral intake related to dysphagia, inability to eat as evidenced by NPO status. - progressing  GOAL:  Patient will meet greater than or equal to 90% of their needs - progressing  MONITOR:   TF tolerance, Skin, Labs  REASON FOR ASSESSMENT:   Consult Enteral/tube feeding initiation and management  ASSESSMENT:   73 yo male admitted after fall at home and being on the floor for 2 days with AKI, rhabdomyolysis, multiple skin wounds. PMH includes Parkinson's disease, DM-2 with polyneuropathy, depression, RLS, tobacco abuse, GERD.  5/23 - admitted 5/28 - cortrak placed 5/29 - MBS, DYS 3  Pt resting in bed at the time of assessment. Brother at bedside. Pt had MBS this AM and was able to have diet put in place. Pt reports that he ate well at lunch and really enjoyed his meal. Discussed with pt that as long as he was consistently able to take in >50% of his meals then tube could be removed. Pt agreeable to this. Will adjust feeds to nocturnal to meet ~65% of needs and allow for an empty stomach during meals. Cortrak can be removed if pt eats well at dinner and breakfast in AM.  Discussed plan with RN and MD  Admit / Current weight: 89.6 kg    Nutritionally Relevant Medications: Scheduled Meds:  PROSource TF20  60 mL Per Tube Daily   free water   400 mL Per Tube Q4H   insulin  aspart  0-15 Units Subcutaneous TID WC   insulin  glargine-yfgn  10 Units Subcutaneous Daily   mirtazapine   15 mg Per Tube QHS   multivitamin with minerals  1 tablet Per Tube Daily    PROTONIX  IV  40 mg Intravenous Q24H   Continuous Infusions:  ampicillin -sulbactam (UNASYN ) IV 3 g (02/20/24 0521)   feeding supplement (JEVITY 1.2 CAL) 1,000 mL (02/20/24 0630)   Labs Reviewed: Sodium 152, chloride 116 BUN 24 CBG ranges from 170-212 mg/dL over the last 24 hours HgbA1c 5.6%  Diet Order:   Diet Order             DIET DYS 3 Room service appropriate? Yes with Assist; Fluid consistency: Thin  Diet effective now                   EDUCATION NEEDS:   No education needs have been identified at this time  Skin: Skin Integrity Issues: Stage 2: - left knee  - right shoulder Deep Tissue Pressure Injury: - left hip - right rib  MASD: - sacrum  Last BM:  5/26 type 6  Height:  Ht Readings from Last 1 Encounters:  02/16/24 6\' 3"  (1.905 m)    Weight:  Wt Readings from Last 1 Encounters:  02/16/24 89.6 kg    Ideal Body Weight:  89.1 kg  BMI:  Body mass index is 24.69 kg/m.  Estimated Nutritional Needs:  Kcal:  2200-2400 Protein:  110-130 gm Fluid:  2.2-2.4 L    Edwena Graham, RD, LDN Registered Dietitian II Please reach out via secure chat

## 2024-02-20 NOTE — [place] FL2 (Signed)
 [place]  MEDICAID FL2 LEVEL OF CARE FORM     IDENTIFICATION  Patient Name: Douglas Price Birthdate: 1951/08/09 Sex: male Admission Date (Current Location): 02/14/2024  [facility] and [place] Number:  Producer, television/film/video and Address:  [facility], [address] [place], [place] [address]      Provider Number: [id number]  Attending Physician Name and Address:  Ree Candy, MD  Relative Name and Phone Number:       Current Level of Care: Hospital Recommended Level of Care: Skilled Nursing Facility Prior Approval Number:    Date Approved/Denied:   PASRR Number: Manual review  Discharge Plan: SNF    Current Diagnoses: Patient Active Problem List   Diagnosis Date Noted   Acute urinary retention 02/18/2024   Dysphagia 02/16/2024   Hypernatremia 02/16/2024   Polypharmacy 02/15/2024   Hypokalemia 02/15/2024   Conjunctivitis 02/15/2024   Rhabdomyolysis 02/14/2024   SIRS (systemic inflammatory response syndrome) (HCC) 02/14/2024   AKI (acute kidney injury) (HCC) 02/14/2024   Multiple wounds of skin 02/14/2024   Cellulitis 02/14/2024   Transaminitis 02/14/2024   Fall at home, initial encounter 02/14/2024   Facial contusion 02/14/2024   Gait disturbance 02/14/2024   Depression 02/14/2024   Insomnia 02/14/2024   BPH (benign prostatic hyperplasia) 02/14/2024   GERD (gastroesophageal reflux disease) 02/14/2024   Pain due to onychomycosis of toenails of both feet 01/31/2023   Parkinson's disease (HCC) 11/25/2017   Diabetic polyneuropathy associated with type 2 diabetes mellitus (HCC) 11/25/2017   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   S/P lumbar spinal fusion 03/14/2016   S/P lumbar laminectomy 09/08/2015   Sleep disturbance 03/24/2014    Orientation RESPIRATION BLADDER Height & Weight     Self, Time, Situation, Place  Normal Incontinent, Indwelling catheter Weight: 197 lb 8.5 oz (89.6 kg) Height:   6\' 3"  (190.5 cm)  BEHAVIORAL SYMPTOMS/MOOD NEUROLOGICAL BOWEL NUTRITION STATUS      Incontinent Diet (see DC summary)  AMBULATORY STATUS COMMUNICATION OF NEEDS Skin   Extensive Assist Verbally Other (Comment) (possible arterial injuries, right and left foot, right and left toe: impregnated gauze, change twice a day)                       Personal Care Assistance Level of Assistance  Bathing, Feeding, Dressing Bathing Assistance: Maximum assistance Feeding assistance: Limited assistance Dressing Assistance: Maximum assistance     Functional Limitations Info  Sight Sight Info: Impaired (glasses)        SPECIAL CARE FACTORS FREQUENCY  PT (By licensed PT), OT (By licensed OT), Speech therapy     PT Frequency: 5x/wk OT Frequency: 5x/wk     Speech Therapy Frequency: 5x/wk      Contractures Contractures Info: Not present    Additional Factors Info  Code Status, Allergies, Psychotropic, Insulin Sliding Scale Code Status Info: Full Allergies Info: Avelox (Moxifloxacin), Quinolones, Lexapro (Escitalopram) Psychotropic Info: Wellbutrin  150mg  2x/day; Sinemet  1tab 3x/day; Remeron 15mg  daily at bed Insulin Sliding Scale Info: see DC summary       Current Medications (02/20/2024):  This is the current hospital active medication list Current Facility-Administered Medications  Medication Dose Route Frequency Provider Last Rate Last Admin   acetaminophen  (TYLENOL ) 160 MG/5ML solution 650 mg  650 mg Per Tube Q4H PRN Faith Homes, MD   650 mg at 02/18/24 0936   acetaminophen  (TYLENOL ) suppository 650 mg  650 mg Rectal Q6H PRN Lena Qualia, MD  650 mg at 02/18/24 2056   albuterol  (PROVENTIL ) (2.5 MG/3ML) 0.083% nebulizer solution 2.5 mg  2.5 mg Nebulization Q6H PRN Smith, Rondell A, MD       Ampicillin-Sulbactam (UNASYN) 3 g in sodium chloride  0.9 % 100 mL IVPB  3 g Intravenous Q6H Faith Homes, MD 200 mL/hr at 02/20/24 1335 3 g at 02/20/24 1335   buPROPion  (WELLBUTRIN )  tablet 150 mg  150 mg Per Tube BID Faith Homes, MD   150 mg at 02/20/24 1610   carbidopa -levodopa  (SINEMET  IR) 25-100 MG per tablet immediate release 1 tablet  1 tablet Per Tube TID Faith Homes, MD   1 tablet at 02/20/24 0915   Chlorhexidine  Gluconate Cloth 2 % PADS 6 each  6 each Topical Daily Sundil, Subrina, MD   6 each at 02/20/24 0921   dextrose  5 % solution   Intravenous Continuous Ree Candy, MD 75 mL/hr at 02/20/24 1306 Infusion Verify at 02/20/24 1306   doxycycline (VIBRA-TABS) tablet 100 mg  100 mg Per Tube Q12H Regalado, Belkys A, MD   100 mg at 02/20/24 9604   DULoxetine  (CYMBALTA ) DR capsule 120 mg  120 mg Oral Daily Felipe Horton, Rondell A, MD   120 mg at 02/20/24 5409   dutasteride  (AVODART ) capsule 0.5 mg  0.5 mg Oral Daily Felipe Horton, Rondell A, MD   0.5 mg at 02/20/24 8119   feeding supplement (JEVITY 1.5 CAL/FIBER) liquid 1,000 mL  1,000 mL Per Tube Continuous Anderson, Chelsey L, MD       feeding supplement (PROSource TF20) liquid 60 mL  60 mL Per Tube Daily Faith Homes, MD   60 mL at 02/20/24 0917   free water 200 mL  200 mL Per Tube Q2H Evette Hoes L, MD   200 mL at 02/20/24 1339   Gerhardt's butt cream   Topical BID Lena Qualia, MD   Given at 02/20/24 0924   heparin injection 5,000 Units  5,000 Units Subcutaneous Q8H Smith, Rondell A, MD   5,000 Units at 02/20/24 1332   insulin aspart (novoLOG) injection 0-15 Units  0-15 Units Subcutaneous TID WC Regalado, Belkys A, MD   3 Units at 02/20/24 1332   insulin glargine-yfgn (SEMGLEE) injection 10 Units  10 Units Subcutaneous Daily Ree Candy, MD   10 Units at 02/20/24 1338   lidocaine  (XYLOCAINE ) 2 % jelly 1 Application  1 Application Urethral Once Girguis, David, MD       lithium  carbonate capsule 150 mg  150 mg Per Tube QHS Girguis, David, MD   150 mg at 02/19/24 2200   LORazepam (ATIVAN) injection 0.5 mg  0.5 mg Intravenous Q6H PRN Regalado, Belkys A, MD       mirtazapine (REMERON) tablet 15 mg  15 mg  Per Tube QHS Girguis, David, MD   15 mg at 02/19/24 2200   multivitamin with minerals tablet 1 tablet  1 tablet Per Tube Daily Faith Homes, MD   1 tablet at 02/20/24 0915   mupirocin  ointment (BACTROBAN ) 2 %   Topical BID Lena Qualia, MD   Given at 02/20/24 1478   nebivolol (BYSTOLIC) tablet 2.5 mg  2.5 mg Per Tube Daily Girguis, David, MD   2.5 mg at 02/20/24 0915   oxyCODONE  (ROXICODONE ) 5 MG/5ML solution 5 mg  5 mg Per Tube Q4H PRN Faith Homes, MD       pantoprazole  (PROTONIX ) injection 40 mg  40 mg Intravenous Q24H Sundil, Subrina, MD   40 mg at 02/19/24 2200  pregabalin  (LYRICA ) capsule 150 mg  150 mg Per Tube BID Faith Homes, MD   150 mg at 02/20/24 0915   sodium chloride  flush (NS) 0.9 % injection 3 mL  3 mL Intravenous Q12H Smith, Rondell A, MD   3 mL at 02/20/24 8295   temazepam  (RESTORIL ) capsule 15 mg  15 mg Per Tube QHS PRN Faith Homes, MD   15 mg at 02/19/24 2200     Discharge Medications: Please see discharge summary for a list of discharge medications.  Relevant Imaging Results:  Relevant Lab Results:   Additional Information SS#: [id number]  Tandy Fam, LCSW

## 2024-02-20 NOTE — Progress Notes (Addendum)
 PROGRESS NOTE Douglas Price  ZOX:[id number] DOB: Sep 18, 1951 DOA: 02/14/2024 PCP: Lonzie Robins, MD   Brief Narrative: Douglas Price is a 73 yo male with PMH depression, Parkinson's disease (not on treatment), insomnia, DMII, GERD, RLS who was brought to the hospital after being found on the floor at home soiled in urine. He had gone to work on Wednesday which was the last time he was reported to be in his normal state.  He typically ambulates with a walker at baseline and works at a school (chaperones students to various classrooms). A well check was performed on Friday after he didn't show up to school Thursday and Friday.  His sister thinks he may have been sitting on the edge of bed and leaned forward losing his balance and falling onto the ground.  Unknown whether he passed out before the fall or hit his face causing him to pass out afterwards. He underwent workup on admission and was found to be febrile with significant rhabdomyolysis and AKI which are progressively improving.   Assessment & Plan:   Principal Problem:   SIRS (systemic inflammatory response syndrome) (HCC) Active Problems:   Rhabdomyolysis   AKI (acute kidney injury) (HCC)   Dysphagia   Acute urinary retention   Parkinson's disease (HCC)   Hypernatremia   Multiple wounds of skin   Transaminitis   Fall at home, initial encounter   Facial contusion   Gait disturbance   Polypharmacy   Conjunctivitis   Diabetic polyneuropathy associated with type 2 diabetes mellitus (HCC)   Depression   Insomnia   BPH (benign prostatic hyperplasia)   GERD (gastroesophageal reflux disease)   Hypokalemia  SIRS  aspiration PNA- stable ORA - Patient presented with fever tachycardia, leukocytosis. - Had elevated lactic acid which was considered in the setting of volume depletion in the setting of rhabdo. - continue Unasyn and doxycycline   AKI - Presented with a creatinine of 1.5 in the setting of rhabdomyolysis.  Creatinine   baseline 0.9 - Improving with IV fluids  Rhabdomyolysis In the setting of fall and immobilization.  Presented with a CK level of 19,000----down to 1,584. No need to acutely follow Continue with IV fluids, monitor electrolytes  Acute urinary retention: - Continue Foley catheter.   "Developed urinary retention worse on 02/17/2024 and Foley catheter placed; required coud cath due to difficulty with placement initially so would not remove until regains much more strength and cognition "  Dysphagia - Evaluated by speech and recommended n.p.o. status -Core track tube replaced 5/28 - modified barium swallow today and now approved for dysphagia 3 diet. If trial goes well can remove core track.   Hypernatremia- Na+ 161 and started on D5 IVF with improvement to 152 today. D5 fluids continued at slower rate. Hopefully will continue to improve now that has PO intake.  - CBG monitoring - BMP am  - free water with core track In the setting of dehydration rhabdomyolysis  Parkinson's disease - Patient follows with Dr. Arabella Beach. - Has not been on treatment due to not benefit from levo [place] and noted DBS candidate due to underlying severe depression - He was having worsening tremors in the setting of acute illness, Dr. Gladstone Lamer discussed with neurology and patient was started on a trial of Sinemet  and low-dose scheduled Ativan - continue Ativan to as needed to avoid oversedation.  Conjunctivitis:  - Continue trial of erythromycin  Polypharmacy On multiple sedating medications for treatment of his depression and Parkinson's along with insomnia; concerned that  this contributed to his fall -Medications include Wellbutrin , Klonopin , Cymbalta , Norco, Remeron , Lyrica , Restoril . - Currently unable to receive Wellbutrin , Cymbalta  because unable to crush medication  Gait disturbance - Multifactorial from underlying Parkinson's and deconditioning  Facial contusion - No acute fracture on trauma workup. Soft  tissue contusion over the right orbit and forehead  Fall at home, initial encounter - Severely deconditioned - PT/OT ordered now that he is clinically improving   Transaminitis - Presumed from rhabdo - Continue trending   Multiple wounds of skin - No obvious signs of cellulitis - Continue wound care as per WOC nurse   Diabetic polyneuropathy associated with type 2 diabetes mellitus (HCC) - On Lyrica  at home   Depression - Follows with psychiatry outpatient, notes reviewed - Lithium  level low (0.11) and he endorses compliance -Continue lithium , Remeron , Restoril , Cymbalta , Wellbutrin    Insomnia Continue Restoril  and Remeron    BPH (benign prostatic hyperplasia) - Continue Flomax  Currently has Foley catheter placed   GERD (gastroesophageal reflux disease) - Continue Protonix    Hypokalemia - Replete as needed  Diarrhea: Denies abdominal pain, likely in the setting of tube feeding and free water   Nutrition Problem: Inadequate oral intake Etiology: dysphagia, inability to eat  Interventions: Tube feeding, MVI  Estimated body mass index is 24.69 kg/m as calculated from the following:   Height as of this encounter: 6\' 3"  (1.905 m).   Weight as of this encounter: 89.6 kg.   DVT prophylaxis: Heparin  Code Status: Full code Family Communication: No family at bedside Disposition Plan:  Status is: Inpatient Remains inpatient appropriate because: Awaiting clinical improvement  Consultants:  None Procedures:  Barium swallow  Antimicrobials: unasyn , doxycycline    Subjective: Patient reports doing well today. He wants to eat and get ng tube removed.  He feels that is mental status is closer to baseline. Looking forward to getting better.  Objective: Vitals:   02/19/24 2341 02/20/24 0000 02/20/24 0350 02/20/24 0600  BP: 133/63  (!) 141/66   Pulse: 82 93 66 (!) 59  Resp: 19 20 15 15   Temp: 98.9 F (37.2 C)  98.4 F (36.9 C)   TempSrc: Axillary  Axillary   SpO2:  99%  99%   Weight:      Height:        Intake/Output Summary (Last 24 hours) at 02/20/2024 0732 Last data filed at 02/20/2024 0526 Gross per 24 hour  Intake 4294.08 ml  Output 4780 ml  Net -485.92 ml   Filed Weights   02/16/24 2100  Weight: 89.6 kg    Examination:  General exam: Appears calm and comfortable  Respiratory system: Clear to auscultation. Respiratory effort normal. Cardiovascular system: S1 & S2 heard, RRR. Gastrointestinal system: Abdomen is nondistended, soft and nontender. No organomegaly or masses felt. Normal bowel sounds heard. Central nervous system: Alert and oriented.  Generalized weakness, answer questions Extremities: neg edema Skin: Has multiple skin abrasions  Data Reviewed: I have personally reviewed following labs and imaging studies  CBC: Recent Labs  Lab 02/16/24 0701 02/17/24 0635 02/18/24 0529 02/19/24 0550 02/20/24 0501  WBC 18.0* 17.3* 18.9* 17.9* 13.7*  NEUTROABS 16.1* 14.9* 15.6* 15.2* 10.6*  HGB 13.0 13.5 13.4 13.0 12.3*  HCT 39.9 42.0 42.6 42.7 39.7  MCV 93.7 95.2 99.8 102.2* 99.7  PLT 228 202 198 164 151   Basic Metabolic Panel: Recent Labs  Lab 02/16/24 0701 02/17/24 0846 02/18/24 0529 02/18/24 0958 02/18/24 1754 02/19/24 0550 02/19/24 1255 02/20/24 0501  NA 151* 155* 161* 161* 160* 161*  158* 152*  K 3.3* 3.3* 3.2* 3.2* 3.5 3.6 3.2* 3.5  CL 115* 120* 125* 128* 126* 124* 123* 116*  CO2 22 25 28 30 27 26 29 25   GLUCOSE 157* 191* 210* 187* 187* 180* 200* 238*  BUN 46* 38* 30* 29* 26* 25* 25* 24*  CREATININE 1.39* 1.15 1.23 1.11 1.04 0.92 0.90 0.81  CALCIUM 8.1* 8.0* 7.8* 7.5* 7.6* 7.6* 7.6* 7.2*  MG 2.0 2.1 2.2  --   --  2.0  --  1.9   GFR: Estimated Creatinine Clearance: 101.4 mL/min (by C-G formula based on SCr of 0.81 mg/dL). Liver Function Tests: Recent Labs  Lab 02/16/24 0701 02/17/24 0846 02/18/24 0529 02/19/24 0550 02/20/24 0501  AST 472* 333* 252* 194* 121*  ALT 198* 217* 131* 135* 98*  ALKPHOS 42  41 36* 41 40  BILITOT 2.0* 1.2 1.0 1.1 0.8  PROT 5.7* 5.5* 5.3* 5.0* 4.4*  ALBUMIN  2.8* 2.6* 2.5* 2.3* 2.0*   Radiology Studies: DG CHEST PORT 1 VIEW Result Date: 02/19/2024 CLINICAL DATA:  Fevers EXAM: PORTABLE CHEST 1 VIEW COMPARISON:  02/16/2024 FINDINGS: Cardiac shadow is within normal limits. Gastric catheter extends into the stomach. Right basilar atelectasis is noted. Focal confluent infiltrate is seen. No bony abnormality is noted. IMPRESSION: Mild right basilar atelectasis. Electronically Signed   By: Violeta Grey M.D.   On: 02/19/2024 10:29    Scheduled Meds:  buPROPion   150 mg Per Tube BID   carbidopa -levodopa   1 tablet Per Tube TID   Chlorhexidine  Gluconate Cloth  6 each Topical Daily   doxycycline  100 mg Per Tube Q12H   DULoxetine   120 mg Oral Daily   dutasteride   0.5 mg Oral Daily   erythromycin   Both Eyes QID   feeding supplement (PROSource TF20)  60 mL Per Tube Daily   free water  400 mL Per Tube Q4H   Gerhardt's butt cream   Topical BID   heparin  5,000 Units Subcutaneous Q8H   insulin aspart  0-15 Units Subcutaneous TID WC   insulin aspart  0-5 Units Subcutaneous QHS   lidocaine   1 Application Urethral Once   lithium  carbonate  150 mg Per Tube QHS   mirtazapine  15 mg Per Tube QHS   multivitamin with minerals  1 tablet Per Tube Daily   mupirocin  ointment   Topical BID   nebivolol  2.5 mg Per Tube Daily   pantoprazole  (PROTONIX ) IV  40 mg Intravenous Q24H   pregabalin   150 mg Per Tube BID   sodium chloride  flush  3 mL Intravenous Q12H   Continuous Infusions:  ampicillin-sulbactam (UNASYN) IV 3 g (02/20/24 0521)   dextrose  125 mL/hr at 02/19/24 2233   feeding supplement (JEVITY 1.2 CAL) 1,000 mL (02/20/24 0630)     LOS: 6 days    Time spent: 45 minutes    Ree Candy, MD [facility]   If 7PM-7AM, please contact night-coverage [email or web address]  02/20/2024, 7:32 AM

## 2024-02-21 ENCOUNTER — Telehealth: Payer: Self-pay | Admitting: Neurology

## 2024-02-21 ENCOUNTER — Ambulatory Visit: Payer: Medicare Other | Admitting: Physician Assistant

## 2024-02-21 LAB — BASIC METABOLIC PANEL WITH GFR
Anion gap: 13 (ref 5–15)
BUN: 19 mg/dL (ref 8–23)
CO2: 22 mmol/L (ref 22–32)
Calcium: 7.1 mg/dL — ABNORMAL LOW (ref 8.9–10.3)
Chloride: 108 mmol/L (ref 98–111)
Creatinine, Ser: 0.86 mg/dL (ref 0.61–1.24)
GFR, Estimated: >60 mL/min (ref >60)
Glucose, Bld: 263 mg/dL — ABNORMAL HIGH (ref 70–99)
Potassium: 4.3 mmol/L (ref 3.5–5.1)
Sodium: 143 mmol/L (ref 135–145)

## 2024-02-21 LAB — GLUCOSE, CAPILLARY
Glucose-Capillary: 221 mg/dL — ABNORMAL HIGH (ref 70–99)
Glucose-Capillary: 263 mg/dL — ABNORMAL HIGH (ref 70–99)
Glucose-Capillary: 172 mg/dL — ABNORMAL HIGH (ref 70–99)
Glucose-Capillary: 168 mg/dL — ABNORMAL HIGH (ref 70–99)

## 2024-02-21 MED ORDER — IRBESARTAN 150 MG PO TABS
75.0000 mg | ORAL_TABLET | Freq: Every day | ORAL | Status: DC
  Administered 2024-02-21 – 2024-02-27 (×7): 75 mg via ORAL
  Filled 2024-02-21 (×7): qty 1

## 2024-02-21 MED ORDER — MELATONIN 3 MG PO TABS
3.0000 mg | ORAL_TABLET | Freq: Every day | ORAL | Status: DC
  Administered 2024-02-21 – 2024-02-26 (×6): 3 mg via ORAL
  Filled 2024-02-21 (×6): qty 1

## 2024-02-21 NOTE — Progress Notes (Signed)
 Speech Language Pathology Treatment: Dysphagia  Patient Details Name: MATTIE NOVOSEL MRN: [id number] DOB: 1951-02-20 Today's Date: 02/21/2024 Time: 4098-1191 SLP Time Calculation (min) (ACUTE ONLY): 35 min  Assessment / Plan / Recommendation Clinical Impression  Mr. Seaman is doing very well with PO diet. Exhibits excellent oral control/mastication (lower dentures cleaned and placed prior to eating).  Drank sips of thin liquid from a straw without any difficulty. Protecting airway well.  Unable to feed himself - significant edema bilaterally.   Needed only initial verbal cue to monitor rate/quantity with liquids - followed through without reminders.  Mentioned double vision as a new issue since admission - will reach out to OT to see if they can assess.  Dr. Alva Jewels aware.  Cortrak hopefully will be D/Cd today. SLP will follow briefly after upgrade to regular solids. D/W pt and his brother, [place].   HPI HPI: Mr. Mcclurg is a 73 yo male with who was brought to ED after being found down at home. LKW 5/21, presented 5/24. Dx with rhabdo, SIRS, AKI, polypharmacy (multiple sedating meds for tx of depression and Parkinson's), multiple wounds of skin, transaminitis, facial contusion. PMH depression, Parkinson's disease (not on treatment), insomnia, DMII, GERD, RLS.      SLP Plan  Continue with current plan of care      Recommendations for follow up therapy are one component of a multi-disciplinary discharge planning process, led by the attending physician.  Recommendations may be updated based on patient status, additional functional criteria and insurance authorization.    Recommendations  Diet recommendations: Regular;Thin liquid Liquids provided via: Cup;Straw Medication Administration: Crushed with puree Supervision: Staff to assist with self feeding Postural Changes and/or Swallow Maneuvers: Seated upright 90 degrees                  Oral care BID   Frequent or constant  Supervision/Assistance Dysphagia, oropharyngeal phase (R13.12)     Continue with current plan of care   Brownie Gockel L. Beatris Lincoln, MA CCC/SLP Clinical Specialist - Acute Care SLP Acute Rehabilitation Services Office number [phone]   Myna Asal [name]  02/21/2024, 9:33 AM

## 2024-02-21 NOTE — Progress Notes (Addendum)
 PROGRESS NOTE Douglas Price  ZOX:[id number] DOB: 1951-04-29 DOA: 02/14/2024 PCP: Douglas Robins, MD   Brief Narrative: [name] is a 73 yo male with PMH depression, Parkinson's disease (not on treatment), insomnia, DMII, GERD, RLS who was brought to the hospital after being found on the floor at home soiled in urine. He had gone to work on Wednesday which was the last time he was reported to be in his normal state.  He typically ambulates with a walker at baseline and works at a school (chaperones students to various classrooms). A well check was performed on Friday after he didn't show up to school Thursday and Friday.  His sister thinks he may have been sitting on the edge of bed and leaned forward losing his balance and falling onto the ground.  Unknown whether he passed out before the fall or hit his face causing him to pass out afterwards. He underwent workup on admission and was found to be febrile with significant rhabdomyolysis and AKI which are progressively improving.   Medically stable for dc once placement confirmed.   Assessment & Plan:   Principal Problem:   SIRS (systemic inflammatory response syndrome) (HCC) Active Problems:   Rhabdomyolysis   AKI (acute kidney injury) (HCC)   Dysphagia   Acute urinary retention   Parkinson's disease (HCC)   Hypernatremia   Multiple wounds of skin   Transaminitis   Fall at home, initial encounter   Facial contusion   Gait disturbance   Polypharmacy   Conjunctivitis   Diabetic polyneuropathy associated with type 2 diabetes mellitus (HCC)   Depression   Insomnia   BPH (benign prostatic hyperplasia)   GERD (gastroesophageal reflux disease)   Hypokalemia  SIRS  aspiration PNA- stable ORA - Patient presented with fever tachycardia, leukocytosis. - Had elevated lactic acid which was considered in the setting of volume depletion in the setting of rhabdo. - completed Unasyn . continue doxycycline    AKI- resolved with IVF -  Presented with a creatinine of 1.5 in the setting of rhabdomyolysis.   Rhabdomyolysis In the setting of fall and immobilization.  Presented with a CK level of 19,000----down to 1,584. No need to acutely follow - encourage PO fluid intake, monitor electrolytes. IV fluids discontinued with generalized edema starting to take place  Acute urinary retention: - Continue Foley catheter.   "Developed urinary retention worse on 02/17/2024 and Foley catheter placed; required coud cath due to difficulty with placement initially so would not remove until regains much more strength and cognition "  Dysphagia- Evaluated by speech and recommended n.p.o. status initially. Core track tube replaced 5/28 and to be removed 5/30.  - modified barium swallow passed 5/29 and now approved for dysphagia 3 diet.   Hypernatremia- Na+ 161 and started on D5 IVF with improvement to 152. Now 143. IV fluids discontinued due to generalized edema. Hopefully will stabilize now that has PO intake.  - BMP am  In the setting of dehydration rhabdomyolysis  Parkinson's disease - Patient follows with Dr. Arabella Price. - Has not been on treatment due to not benefit from levo [place] and noted DBS candidate due to underlying severe depression - He was having worsening tremors in the setting of acute illness, Dr. Gladstone Price discussed with neurology and patient was started on a trial of Sinemet  and seems to be tolerating very well  Conjunctivitis:  - Continue trial of erythromycin  to 5/31  Polypharmacy On multiple sedating medications for treatment of his depression and Parkinson's along with insomnia; concerned that  this contributed to his fall -Medications include Wellbutrin , Klonopin , Cymbalta , Norco, Remeron , Lyrica , Restoril . - until today, unable to receive Wellbutrin , Cymbalta  because unable to crush medication. Does not appear to be having withdrawals. Can slowly titrate back on as needed  Gait disturbance - Multifactorial from  underlying Parkinson's and deconditioning - PT/OT. Recommending SNF. Auth is pending  Facial contusion - No acute fracture on trauma workup. Soft tissue contusion over the right orbit and forehead  Fall at home, initial encounter - Severely deconditioned - PT/OT ordered now that he is clinically improving   Transaminitis - Presumed from rhabdo - Continue trending   Multiple wounds of skin - No obvious signs of cellulitis - Continue wound care as per WOC nurse   Diabetic polyneuropathy associated with type 2 diabetes mellitus (HCC) - On Lyrica  at home   Depression - Follows with psychiatry outpatient, notes reviewed - Lithium  level low (0.11) and he endorses compliance -Continue lithium , Remeron , Restoril , Cymbalta , Wellbutrin    Insomnia Continue Restoril  and Remeron    BPH (benign prostatic hyperplasia) - Continue Flomax  Currently has Foley catheter placed   HTN- elevated.  - restart home meds with irbesartan  for formulary replacement  GERD (gastroesophageal reflux disease) - Continue Protonix    Hypokalemia- resolved. K+ 4.3 - Replete as needed  Diarrhea: Denies abdominal pain, likely in the setting of tube feeding and free water . Continue rectal tube per nursing request. Should improve with PO intake now  Nutrition Problem: Inadequate oral intake Etiology: dysphagia, inability to eat  Interventions: Tube feeding, MVI  Estimated body mass index is 24.69 kg/m as calculated from the following:   Height as of this encounter: 6\' 3"  (1.905 m).   Weight as of this encounter: 89.6 kg.   DVT prophylaxis: Heparin  Code Status: Full code Family Communication: brother at bedside Disposition Plan:  Status is: Inpatient Remains inpatient appropriate because: Awaiting placement  Consultants:  None Procedures:  Barium swallow  Antimicrobials: unasyn , doxycycline    Subjective: Patient reports doing well today. He is hungry and had no difficulties with dinner. Asks  about his prognosis which we discussed in detail  Objective: Vitals:   02/20/24 2018 02/21/24 0002 02/21/24 0345 02/21/24 0500  BP: 136/68 (!) 125/58 (!) 144/59   Pulse: 64 65  65  Resp:  (!) 21  13  Temp: 98.4 F (36.9 C) 98.7 F (37.1 C) 98.5 F (36.9 C)   TempSrc: Oral Axillary Axillary   SpO2: 99% 98%  99%  Weight:      Height:        Intake/Output Summary (Last 24 hours) at 02/21/2024 0742 Last data filed at 02/21/2024 0201 Gross per 24 hour  Intake 5209.69 ml  Output 4000 ml  Net 1209.69 ml   Filed Weights   02/16/24 2100  Weight: 89.6 kg    Examination:  General exam: Appears calm and comfortable  Respiratory system: Clear to auscultation. Respiratory effort normal. Cardiovascular system: S1 & S2 heard, RRR. Gastrointestinal system: Abdomen is nondistended, soft and nontender. No organomegaly or masses felt. Normal bowel sounds heard. Central nervous system: Alert and oriented.  Generalized weakness, answer questions Extremities: non-pitting edema diffusely Skin: Has multiple skin abrasions  Data Reviewed: I have personally reviewed following labs and imaging studies  CBC: Recent Labs  Lab 02/16/24 0701 02/17/24 0635 02/18/24 0529 02/19/24 0550 02/20/24 0501  WBC 18.0* 17.3* 18.9* 17.9* 13.7*  NEUTROABS 16.1* 14.9* 15.6* 15.2* 10.6*  HGB 13.0 13.5 13.4 13.0 12.3*  HCT 39.9 42.0 42.6 42.7 39.7  MCV 93.7 95.2 99.8 102.2* 99.7  PLT 228 202 198 164 151   Basic Metabolic Panel: Recent Labs  Lab 02/16/24 0701 02/17/24 0846 02/18/24 0529 02/18/24 0958 02/18/24 1754 02/19/24 0550 02/19/24 1255 02/20/24 0501  NA 151* 155* 161* 161* 160* 161* 158* 152*  K 3.3* 3.3* 3.2* 3.2* 3.5 3.6 3.2* 3.5  CL 115* 120* 125* 128* 126* 124* 123* 116*  CO2 22 25 28 30 27 26 29 25   GLUCOSE 157* 191* 210* 187* 187* 180* 200* 238*  BUN 46* 38* 30* 29* 26* 25* 25* 24*  CREATININE 1.39* 1.15 1.23 1.11 1.04 0.92 0.90 0.81  CALCIUM 8.1* 8.0* 7.8* 7.5* 7.6* 7.6* 7.6* 7.2*   MG 2.0 2.1 2.2  --   --  2.0  --  1.9   GFR: Estimated Creatinine Clearance: 101.4 mL/min (by C-G formula based on SCr of 0.81 mg/dL). Liver Function Tests: Recent Labs  Lab 02/16/24 0701 02/17/24 0846 02/18/24 0529 02/19/24 0550 02/20/24 0501  AST 472* 333* 252* 194* 121*  ALT 198* 217* 131* 135* 98*  ALKPHOS 42 41 36* 41 40  BILITOT 2.0* 1.2 1.0 1.1 0.8  PROT 5.7* 5.5* 5.3* 5.0* 4.4*  ALBUMIN  2.8* 2.6* 2.5* 2.3* 2.0*   Radiology Studies: DG Swallowing Func-Speech Pathology Result Date: 02/20/2024 Table formatting from the original result was not included. Modified Barium Swallow Study Patient Details Name: BUCKLEY BRADLY MRN: [id number] Date of Birth: 1951-08-26 Today's Date: 02/20/2024 HPI/PMH: HPI: Mr. Croghan is a 73 yo male with who was brought to ED after being found down at home. LKW 5/21, presented 5/24. Dx with rhabdo, SIRS, AKI, polypharmacy (multiple sedating meds for tx of depression and Parkinson's), multiple wounds of skin, transaminitis, facial contusion. PMH depression, Parkinson's disease (not on treatment), insomnia, DMII, GERD, RLS. Clinical Impression: Pt demonstrates a mild oropharyngeal dysphagia with reliable airway protection (no penetration/aspiration), mild deficits in oral preparation with prolonged mastication (partials not present for study), trace oral residue post-swallow, mild reduction in base-of-tongue to pharyngeal wall contact, leading to solid food residue in valleculae without awareness.  Pill not tested due to concerns for potential lodging in vallecular space.  Recommend starting a dysphagia 3 diet/thin liquids; crush meds in puree.  SLP will follow. Results shared with RN and RD.  DIGEST Swallow Severity Rating*             Safety: 0             Efficiency: 1             Overall Pharyngeal Swallow Severity: 1-mild 1: mild; 2: moderate; 3: severe; 4: profound *The Dynamic Imaging Grade of Swallowing Toxicity is standardized for the head and neck cancer  population, however, demonstrates promising clinical applications across populations to standardize the clinical rating of pharyngeal swallow safety and severity. Factors that may increase risk of adverse event in presence of aspiration [name] & [name] 2021): No data recorded Recommendations/Plan: Swallowing Evaluation Recommendations Swallowing Evaluation Recommendations Recommendations: PO diet PO Diet Recommendation: Dysphagia 3 (Mechanical soft); Thin liquids (Level 0) Liquid Administration via: Cup; Straw Medication Administration: Crushed with puree Supervision: Full assist for feeding Oral care recommendations: Oral care BID (2x/day) Treatment Plan Treatment Plan Treatment recommendations: Therapy as outlined in treatment plan below Follow-up recommendations: -- (tba) Functional status assessment: Patient has had a recent decline in their functional status and demonstrates the ability to make significant improvements in function in a reasonable and predictable amount of time. Treatment frequency: Min 2x/week Treatment duration:  1 week Interventions: Patient/family education; Trials of upgraded texture/liquids; Diet toleration management by SLP Recommendations Recommendations for follow up therapy are one component of a multi-disciplinary discharge planning process, led by the attending physician.  Recommendations may be updated based on patient status, additional functional criteria and insurance authorization. Assessment: Orofacial Exam: Orofacial Exam Oral Cavity: Oral Hygiene: WFL Oral Cavity - Dentition: Missing dentition Orofacial Anatomy: WFL Oral Motor/Sensory Function: WFL Anatomy: Anatomy: WFL Boluses Administered: Boluses Administered Boluses Administered: Thin liquids (Level 0); Mildly thick liquids (Level 2, nectar thick); Puree; Solid  Oral Impairment Domain: Oral Impairment Domain Lip Closure: No labial escape Tongue control during bolus hold: Cohesive bolus between tongue to palatal seal  Bolus preparation/mastication: Slow prolonged chewing/mashing with complete recollection Bolus transport/lingual motion: Brisk tongue motion Oral residue: Trace residue lining oral structures Location of oral residue : Tongue; Floor of mouth Initiation of pharyngeal swallow : Pyriform sinuses  Pharyngeal Impairment Domain: Pharyngeal Impairment Domain Soft palate elevation: No bolus between soft palate (SP)/pharyngeal wall (PW) Laryngeal elevation: Partial superior movement of thyroid cartilage/partial approximation of arytenoids to epiglottic petiole Anterior hyoid excursion: Complete anterior movement Epiglottic movement: Complete inversion Laryngeal vestibule closure: Complete, no air/contrast in laryngeal vestibule Pharyngeal stripping wave : Present - diminished Pharyngoesophageal segment opening: Complete distension and complete duration, no obstruction of flow Tongue base retraction: Trace column of contrast or air between tongue base and PPW Pharyngeal residue: Collection of residue within or on pharyngeal structures Location of pharyngeal residue: Valleculae  Esophageal Impairment Domain: No data recorded Pill: Pill Consistency administered: -- (NT) Penetration/Aspiration Scale Score: Penetration/Aspiration Scale Score 1.  Material does not enter airway: Thin liquids (Level 0); Mildly thick liquids (Level 2, nectar thick); Puree; Solid Compensatory Strategies: Compensatory Strategies Compensatory strategies: Yes Straw: Effective   General Information: Caregiver present: No  Diet Prior to this Study: NPO   Temperature : Normal   Respiratory Status: WFL   Supplemental O2: None (Room air)   History of Recent Intubation: No  Behavior/Cognition: Pleasant mood Self-Feeding Abilities: Needs assist with self-feeding Baseline vocal quality/speech: Normal Volitional Cough: Able to elicit No data recorded Exam Limitations: No limitations Goal Planning: Prognosis for improved oropharyngeal function: Good No data  recorded No data recorded No data recorded Consulted and agree with results and recommendations: Patient; Dietitian; Nurse Pain: Pain Assessment Pain Assessment: No/denies pain Pain Score: 0 Faces Pain Scale: 0 Breathing: 0 Negative Vocalization: 0 Facial Expression: 0 Body Language: 0 Consolability: 0 PAINAD Score: 0 Pain Intervention(s): Limited activity within patient's tolerance; Monitored during session; Repositioned End of Session: Start Time:SLP Start Time (ACUTE ONLY): 0820 Stop Time: SLP Stop Time (ACUTE ONLY): 0843 Time Calculation:SLP Time Calculation (min) (ACUTE ONLY): 23 min Charges: SLP Evaluations $ SLP Speech Visit: 1 Visit SLP Evaluations $MBS Swallow: 1 Procedure $Swallowing Treatment: 1 Procedure SLP visit diagnosis: SLP Visit Diagnosis: Dysphagia, oropharyngeal phase (R13.12) Past Medical History: Past Medical History: Diagnosis Date  Allergic rhinitis due to pollen   Benign neoplasm of colon   Blisters with epidermal loss due to burn (second degree) of foot   Complication of anesthesia   pt woke up during saphenous vein surgery-had epidural  Constipation due to pain medication   Degeneration of lumbar or lumbosacral intervertebral disc   Diverticulosis of colon (without mention of hemorrhage)   DM (diabetes mellitus) (HCC)   Esophageal reflux   Hypertrophy of prostate with urinary obstruction and other lower urinary tract symptoms (LUTS)   Loss of weight   Neuromuscular disorder (HCC)  peripheral neuropathy  Obesity, unspecified   Pain in limb   Restless legs syndrome (RLS)   Sleep disturbance 03/24/2014  Spinal stenosis, unspecified region other than cervical   Tobacco use disorder   Unspecified disease of pericardium   Unspecified essential hypertension   Unspecified local infection of skin and subcutaneous tissue   Varices of other sites  Past Surgical History: Past Surgical History: Procedure Laterality Date  BACK SURGERY    CATARACT EXTRACTION Bilateral   one in july and other in Aug.2023   COLONOSCOPY W/ POLYPECTOMY    CYST EXCISION Left 04/09/2022  Procedure: LEFT INDEX FINGER MUCOID CYST EXCISION;  Surgeon: Brunilda Capra, MD;  Location: [facility];  Service: Orthopedics;  Laterality: Left;  Bier block  CYST EXCISION Left 05/02/2023  Procedure: LEFT INDEX FINGER MUCOID CYST EXCISION AND DISTAL INTERPHALANGEAL JOINT DEBRIDEMENT;  Surgeon: Brunilda Capra, MD;  Location: [facility];  Service: Orthopedics;  Laterality: Left;  LUMBAR LAMINECTOMY/DECOMPRESSION MICRODISCECTOMY Bilateral 09/08/2015  Procedure: Laminectomy and Foraminotomy - Lumbar two-lumbar three bilateral;  Surgeon: Isadora Mar, MD;  Location: [facility];  Service: Neurosurgery;  Laterality: Bilateral;  SAPHENOUS VEIN GRAFT RESECTION Right 09/24/1989  TENDON EXPLORATION Left 04/09/2022  Procedure: DEBRIDEMENT DISTAL INTERPHALANGEAL JOINT LEFT INDEX FINGER;  Surgeon: Brunilda Capra, MD;  Location: [facility];  Service: Orthopedics;  Laterality: Left;  Bier block  TONSILLECTOMY    TRIGGER FINGER RELEASE Left 05/02/2023  Procedure: LEFT RING FINGER TRIGGER RELEASE;  Surgeon: Brunilda Capra, MD;  Location: [facility];  Service: Orthopedics;  Laterality: Left; Rheba Cedar 02/20/2024, 9:25 AM Henri Loft. Beatris Lincoln, MA CCC/SLP Clinical Specialist - Acute Care SLP Acute Rehabilitation Services Office number [phone]  DG CHEST PORT 1 VIEW Result Date: 02/19/2024 CLINICAL DATA:  Fevers EXAM: PORTABLE CHEST 1 VIEW COMPARISON:  02/16/2024 FINDINGS: Cardiac shadow is within normal limits. Gastric catheter extends into the stomach. Right basilar atelectasis is noted. Focal confluent infiltrate is seen. No bony abnormality is noted. IMPRESSION: Mild right basilar atelectasis. Electronically Signed   By: Violeta Grey M.D.   On: 02/19/2024 10:29    Scheduled Meds:  buPROPion   150 mg Per Tube BID   carbidopa -levodopa   1 tablet Per Tube TID   Chlorhexidine  Gluconate Cloth  6 each  Topical Daily   doxycycline   100 mg Per Tube Q12H   DULoxetine   120 mg Oral Daily   dutasteride   0.5 mg Oral Daily   feeding supplement (PROSource TF20)  60 mL Per Tube Daily   free water   200 mL Per Tube Q2H   Gerhardt's butt cream   Topical BID   heparin   5,000 Units Subcutaneous Q8H   insulin  aspart  0-15 Units Subcutaneous TID WC   insulin  glargine-yfgn  10 Units Subcutaneous Daily   lidocaine   1 Application Urethral Once   lithium  carbonate  150 mg Per Tube QHS   mirtazapine   15 mg Per Tube QHS   multivitamin with minerals  1 tablet Per Tube Daily   mupirocin  ointment   Topical BID   nebivolol   2.5 mg Per Tube Daily   pantoprazole  (PROTONIX ) IV  40 mg Intravenous Q24H   pregabalin   150 mg Per Tube BID   sodium chloride  flush  3 mL Intravenous Q12H   Continuous Infusions:  ampicillin -sulbactam (UNASYN ) IV 3 g (02/21/24 0515)   dextrose  75 mL/hr at 02/21/24 0201   feeding supplement (JEVITY 1.5 CAL/FIBER) 1,000 mL (02/20/24 1800)     LOS: 7  days    Time spent: 45 minutes    Ree Candy, MD [facility]   If 7PM-7AM, please contact night-coverage [email or web address]  02/21/2024, 7:42 AM

## 2024-02-21 NOTE — Progress Notes (Signed)
 Occupational Therapy Treatment Patient Details Name: Douglas Price MRN: [id number] DOB: 1950-10-28 Today's Date: 02/21/2024   History of present illness Patient is a 73 yo male presenting to the ED after being found down for an unknown period of time on 02/14/24. Admitted to with SIRS, AKI, rhabdomyolysis. PMH includes: DM, restless leg, Parkinson's, spinal stenosis, diverticulosis   OT comments  Patient seen in conjunction with PT in order to address OOB movement and increase overall activity tolerance and strength. Patient more alert to date, but noted increased edema on R side especially in RUE. Patient also reporting double vision, with OT taping L eye medially. Patient states his double vision comes and goes, therefore will continue to assess. Patient requiring max A of 2 to transition to EOB, with multi-modal cues needed to initiate with BLEs and LUE, and PT and OT assisting with trunk and BLEs. Max cues and max A required to sit EOB due to posteior lean. PROM completed of BLEs, with improved placement on floor, however when attempting to stand x2, patient with posterior lean and feet sliding out/knees buckling. Patient may benefit from stedy in next session as wounds are healing on knees. OT recommendation remains appropriate, will follow.       If plan is discharge home, recommend the following:  Two people to help with walking and/or transfers;Two people to help with bathing/dressing/bathroom;Assistance with cooking/housework;Direct supervision/assist for medications management;Direct supervision/assist for financial management;Assist for transportation;Help with stairs or ramp for entrance;Supervision due to cognitive status;Assistance with feeding   Equipment Recommendations  Other (comment) (defer to next venue)    Recommendations for Other Services      Precautions / Restrictions Precautions Precautions: Fall Recall of Precautions/Restrictions: Impaired Precaution/Restrictions  Comments: pt has poor safety awareness. Restrictions Weight Bearing Restrictions Per Provider Order: No       Mobility Bed Mobility Overal bed mobility: Needs Assistance Bed Mobility: Supine to Sit, Sit to Supine     Supine to sit: Max assist, +2 for safety/equipment, +2 for physical assistance Sit to supine: Total assist, +2 for physical assistance, +2 for safety/equipment   General bed mobility comments: Improved initiation in BLES and LUE with multi-modal cues, however assist needed with trunk and BLEs to come into sitting, heavy posterior lean, total A of 2 to return to bed due to fatigue    Transfers Overall transfer level: Needs assistance Equipment used: Rolling walker (2 wheels) Transfers: Sit to/from Stand Sit to Stand: Max assist, +2 physical assistance, +2 safety/equipment           General transfer comment: Attempting x2 (once with RW and once without). Patient benefitting from rocking to attempt to come into standing, however minimal activation noted and bilateral knee buckling when using RW. RW abandoned, with OT and PT completing face to face transfer, however requiring max A of 2 to clear hips with LLE sliding and unable to achieve full stand     Balance Overall balance assessment: Needs assistance Sitting-balance support: Bilateral upper extremity supported, Feet supported Sitting balance-Leahy Scale: Poor Sitting balance - Comments: Heavy posterior lean, Max A required to maintain, brief moments of min A but fleeting Postural control: Right lateral lean, Posterior lean Standing balance support: Bilateral upper extremity supported, Reliant on assistive device for balance Standing balance-Leahy Scale: Zero Standing balance comment: unable to acheive standing to date                           ADL  either performed or assessed with clinical judgement   ADL Overall ADL's : Needs assistance/impaired Eating/Feeding: Maximal assistance;Sitting    Grooming: Maximal assistance;Sitting                   Toilet Transfer: Maximal assistance;Total assistance;+2 for physical assistance;+2 for safety/equipment   Toileting- Clothing Manipulation and Hygiene: Total assistance;Bed level Toileting - Clothing Manipulation Details (indicate cue type and reason): catheter and flexi-seal     Functional mobility during ADLs: Maximal assistance;+2 for physical assistance;+2 for safety/equipment;Cueing for safety;Cueing for sequencing;Rolling walker (2 wheels) General ADL Comments: Patient seen in conjunction with PT in order to address OOB movement and increase overall activity tolerance and strength. Patient more alert to date, but noted increased edema on R side especially in RUE. Patient also reporting double vision, with OT taping L eye medially. Patient states his double vision comes and goes, therefore will continue to assess. Patient requiring max A of 2 to transition to EOB, with multi-modal cues needed to initiate with BLEs and LUE, and PT and OT assisting with trunk and BLEs. Max cues and max A required to sit EOB due to posteior lean. PROM completed of BLEs, with improved placement on floor, however when attempting to stand x2, patient with posterior lean and feet sliding out/knees buckling. Patient may benefit from stedy in next session as wounds are healing on knees. OT recommendation remains appropriate, will follow.    Extremity/Trunk Assessment Upper Extremity Assessment Upper Extremity Assessment: RUE deficits/detail;LUE deficits/detail RUE Deficits / Details: edema worsening on R side, time spent on ranging fingers, minimal to no grip noted when promted this session, more than likely due to significant edema RUE Sensation: decreased light touch RUE Coordination: decreased fine motor;decreased gross motor LUE Deficits / Details: edema present, but appears to be improving on L side, more functional grip noted LUE Sensation:  decreased light touch LUE Coordination: decreased fine motor;decreased gross motor            Vision   Vision Assessment?: Vision impaired- to be further tested in functional context Additional Comments: Patient with reported double vision, OT taping L side internally   Perception Perception Perception: Impaired Preception Impairment Details: Body Scheme   Praxis Praxis Praxis: Impaired Praxis Impairment Details: Initiation;Motor planning;Organization   Communication Communication Communication: No apparent difficulties   Cognition Arousal: Alert Behavior During Therapy: WFL for tasks assessed/performed Cognition: Cognition impaired     Awareness: Online awareness impaired Memory impairment (select all impairments): Working [employer] fast streamer, Conservation officer, historic buildings Attention impairment (select first level of impairment): Sustained attention Executive functioning impairment (select all impairments): Initiation, Organization, Sequencing, Reasoning, Problem solving OT - Cognition Comments: More alert and participatory, required increased time to follow all commands to date                 Following commands: Impaired Following commands impaired: Follows one step commands with increased time      Cueing   Cueing Techniques: Verbal cues, Tactile cues, Visual cues  Exercises      Shoulder Instructions       General Comments      Pertinent Vitals/ Pain       Pain Assessment Pain Assessment: Faces Faces Pain Scale: Hurts a little bit Pain Location: Generalized with movement Pain Descriptors / Indicators: Grimacing, Discomfort, Guarding Pain Intervention(s): Limited activity within patient's tolerance, Repositioned, Monitored during session  Home Living  Prior Functioning/Environment              Frequency  Min 2X/week        Progress Toward Goals  OT Goals(current goals can now be found in  the care plan section)  Progress towards OT goals: Progressing toward goals  Acute Rehab OT Goals Patient Stated Goal: to get better OT Goal Formulation: With patient/family Time For Goal Achievement: 03/04/24 Potential to Achieve Goals: Fair  Plan      Co-evaluation      Reason for Co-Treatment: Complexity of the patient's impairments (multi-system involvement);Necessary to address cognition/behavior during functional activity;For patient/therapist safety;To address functional/ADL transfers PT goals addressed during session: Mobility/safety with mobility;Balance OT goals addressed during session: ADL's and self-care;Proper use of Adaptive equipment and DME      AM-PAC OT "6 Clicks" Daily Activity     Outcome Measure   Help from another person eating meals?: A Lot Help from another person taking care of personal grooming?: A Lot Help from another person toileting, which includes using toliet, bedpan, or urinal?: Total Help from another person bathing (including washing, rinsing, drying)?: A Lot Help from another person to put on and taking off regular upper body clothing?: A Lot Help from another person to put on and taking off regular lower body clothing?: Total 6 Click Score: 10    End of Session Equipment Utilized During Treatment: Gait belt;Rolling walker (2 wheels)  OT Visit Diagnosis: Unsteadiness on feet (R26.81);Other abnormalities of gait and mobility (R26.89);Repeated falls (R29.6);Muscle weakness (generalized) (M62.81);History of falling (Z91.81);Other symptoms and signs involving cognitive function   Activity Tolerance Patient limited by fatigue   Patient Left in bed;with call bell/phone within reach;with family/visitor present   Nurse Communication Mobility status        Time: 1610-9604 OT Time Calculation (min): 38 min  Charges: OT General Charges $OT Visit: 1 Visit OT Treatments $Self Care/Home Management : 23-37 mins  Mollie Anger E. Kymorah Korf,  OTR/L Acute Rehabilitation Services [phone]   Vincent Greek 02/21/2024, 5:01 PM

## 2024-02-21 NOTE — Telephone Encounter (Signed)
 Pt is currently in the hospital after a fall. The hospital has been giving him carbidopa -levodopa  and his tremors are gone. He has been surprised to see that. They will be discharging him to skilled nursing probably next week. He wanted Dr. Winferd Hatter to be aware he is now responding to the carbidopa -levodopa .

## 2024-02-21 NOTE — Progress Notes (Signed)
 Physical Therapy Treatment Patient Details Name: Douglas Price MRN: [id number] DOB: 1951-05-04 Today's Date: 02/21/2024   History of Present Illness Patient is a 73 yo male presenting to the ED after being found down for an unknown period of time on 02/14/24. Admitted to with SIRS, AKI, rhabdomyolysis. PMH includes: DM, restless leg, Parkinson's, spinal stenosis, diverticulosis    PT Comments  Pt is slowly progressing towards goals. Pt seems stiffer today than yesterday with more swelling most likely from lack of mobility with currently co-morbidities. Pt was 2 person Max A for bed mobility and 2 person max A to attempt sit to stand at EOB. Pt is able to assist but unable to hold in standing today. Pt fatigued and was only able to attempt 2x. Due to pt current functional status, home set up and available assistance at home recommending skilled physical therapy services < 3 hours/day in order to address strength, balance and functional mobility to decrease risk for falls, injury, immobility, skin break down and re-hospitalization.      If plan is discharge home, recommend the following: Two people to help with walking and/or transfers;Assistance with cooking/housework;Assist for transportation;Help with stairs or ramp for entrance   Can travel by private vehicle     No  Equipment Recommendations  Wheelchair cushion (measurements PT);Hospital bed;Wheelchair (measurements PT);BSC/3in1       Precautions / Restrictions Precautions Precautions: Fall Recall of Precautions/Restrictions: Impaired Precaution/Restrictions Comments: pt has poor safety awareness. Restrictions Weight Bearing Restrictions Per Provider Order: No     Mobility  Bed Mobility Overal bed mobility: Needs Assistance Bed Mobility: Supine to Sit, Sit to Supine     Supine to sit: Max assist, +2 for safety/equipment, +2 for physical assistance Sit to supine: Total assist, +2 for physical assistance, +2 for  safety/equipment   General bed mobility comments: Improved initiation in BLES and LUE with multi-modal cues, however assist needed with trunk and BLEs to come into sitting, heavy posterior lean, total A of 2 to return to bed due to fatigue    Transfers Overall transfer level: Needs assistance Equipment used: Rolling walker (2 wheels) Transfers: Sit to/from Stand Sit to Stand: Max assist, +2 physical assistance, +2 safety/equipment           General transfer comment: Attempting x2 (once with RW and once without). Patient benefitting from rocking to attempt to come into standing, however minimal activation noted and bilateral knee buckling when using RW. RW abandoned, with OT and PT completing face to face transfer, however requiring max A of 2 to clear hips with LLE sliding and unable to achieve full stand        Balance Overall balance assessment: Needs assistance Sitting-balance support: Bilateral upper extremity supported, Feet supported Sitting balance-Leahy Scale: Poor Sitting balance - Comments: Heavy posterior lean, Max A required to maintain, brief moments of min A but fleeting Postural control: Right lateral lean, Posterior lean Standing balance support: Bilateral upper extremity supported, Reliant on assistive device for balance Standing balance-Leahy Scale: Zero Standing balance comment: unable to acheive standing to date      Communication Communication Communication: No apparent difficulties  Cognition Arousal: Alert Behavior During Therapy: WFL for tasks assessed/performed   PT - Cognitive impairments: Awareness, Problem solving, Safety/Judgement, Sequencing, Initiation       Following commands impaired: Follows one step commands with increased time    Cueing Cueing Techniques: Verbal cues, Tactile cues, Visual cues     General Comments General comments (skin integrity, edema, etc.):  brother present during session. Pt continues with scrapes up and down legs  with bandages.      Pertinent Vitals/Pain Pain Assessment Pain Assessment: Faces Faces Pain Scale: Hurts a little bit Breathing: normal Negative Vocalization: none Facial Expression: smiling or inexpressive Body Language: relaxed Consolability: no need to console PAINAD Score: 0 Pain Location: Generalized with movement Pain Descriptors / Indicators: Grimacing, Discomfort, Guarding Pain Intervention(s): Monitored during session, Limited activity within patient's tolerance     PT Goals (current goals can now be found in the care plan section) Acute Rehab PT Goals Patient Stated Goal: to improve mobility PT Goal Formulation: With patient/family Time For Goal Achievement: 03/04/24 Potential to Achieve Goals: Good Progress towards PT goals: Progressing toward goals    Frequency    Min 2X/week      PT Plan  Continue with current POC     Co-evaluation PT/OT/SLP Co-Evaluation/Treatment: Yes Reason for Co-Treatment: Complexity of the patient's impairments (multi-system involvement);Necessary to address cognition/behavior during functional activity;For patient/therapist safety;To address functional/ADL transfers PT goals addressed during session: Mobility/safety with mobility;Balance OT goals addressed during session: ADL's and self-care;Proper use of Adaptive equipment and DME      AM-PAC PT "6 Clicks" Mobility   Outcome Measure  Help needed turning from your back to your side while in a flat bed without using bedrails?: A Lot Help needed moving from lying on your back to sitting on the side of a flat bed without using bedrails?: Total Help needed moving to and from a bed to a chair (including a wheelchair)?: Total Help needed standing up from a chair using your arms (e.g., wheelchair or bedside chair)?: Total Help needed to walk in hospital room?: Total Help needed climbing 3-5 steps with a railing? : Total 6 Click Score: 7    End of Session Equipment Utilized During  Treatment: Gait belt Activity Tolerance: Patient tolerated treatment well;Patient limited by fatigue Patient left: with call bell/phone within reach;in bed;with bed alarm set;with family/visitor present Nurse Communication: Mobility status PT Visit Diagnosis: Unsteadiness on feet (R26.81);Other abnormalities of gait and mobility (R26.89);Muscle weakness (generalized) (M62.81);History of falling (Z91.81)     Time: 8295-6213 PT Time Calculation (min) (ACUTE ONLY): 38 min  Charges:    $Therapeutic Activity: 8-22 mins PT General Charges $$ ACUTE PT VISIT: 1 Visit                     Sloan Duncans, DPT, CLT  Acute Rehabilitation Services Office: [phone] (Secure chat preferred)    Jenice Mitts 02/21/2024, 5:15 PM

## 2024-02-21 NOTE — TOC Progression Note (Signed)
 Transition of Care [facility]) - Progression Note    Patient Details  Name: Douglas Price MRN: [id number] Date of Birth: Feb 01, 1951  [facility] CM/SW Contact  Tandy Fam, [place] Phone Number: 02/21/2024, 4:02 PM  Clinical Narrative:   CSW met with patient and brother at bedside to provide bed offers for SNF and answer questions. Family to review options available and will determine choice. Documents also uploaded to [facility] for review for PAS[facility]. CSW to follow.    Expected Discharge Plan: Skilled Nursing Facility Barriers to Discharge: Continued Medical Work up  Expected Discharge Plan and Services   Discharge Planning Services: CM Consult Post Acute Care Choice: IP Rehab Living arrangements for the past 2 months: Apartment                                       Social Determinants of Health (SDOH) Interventions SDOH Screenings   Food Insecurity: No Food Insecurity (02/16/2024)  Housing: Low Risk  (02/16/2024)  Transportation Needs: No Transportation Needs (02/16/2024)  Utilities: Not At Risk (02/16/2024)  Depression (PHQ2-9): Medium Risk (02/11/2023)  Financial Resource Strain: Low Risk  (02/07/2023)  Social Connections: Moderately Isolated (02/16/2024)  Tobacco Use: High Risk (02/20/2024)    Readmission Risk Interventions     No data to display

## 2024-02-21 NOTE — Telephone Encounter (Signed)
 noted

## 2024-02-22 DIAGNOSIS — M6282 Rhabdomyolysis: Secondary | ICD-10-CM | POA: Diagnosis not present

## 2024-02-22 DIAGNOSIS — E876 Hypokalemia: Secondary | ICD-10-CM

## 2024-02-22 DIAGNOSIS — R6510 Systemic inflammatory response syndrome (SIRS) of non-infectious origin without acute organ dysfunction: Secondary | ICD-10-CM | POA: Diagnosis not present

## 2024-02-22 DIAGNOSIS — N179 Acute kidney failure, unspecified: Secondary | ICD-10-CM | POA: Diagnosis not present

## 2024-02-22 DIAGNOSIS — E870 Hyperosmolality and hypernatremia: Secondary | ICD-10-CM | POA: Diagnosis not present

## 2024-02-22 LAB — GLUCOSE, CAPILLARY
Glucose-Capillary: 107 mg/dL — ABNORMAL HIGH (ref 70–99)
Glucose-Capillary: 137 mg/dL — ABNORMAL HIGH (ref 70–99)
Glucose-Capillary: 151 mg/dL — ABNORMAL HIGH (ref 70–99)
Glucose-Capillary: 188 mg/dL — ABNORMAL HIGH (ref 70–99)

## 2024-02-22 MED ORDER — KETOTIFEN FUMARATE 0.035 % OP SOLN
1.0000 [drp] | Freq: Two times a day (BID) | OPHTHALMIC | Status: DC
  Administered 2024-02-23 – 2024-02-27 (×9): 1 [drp] via OPHTHALMIC
  Filled 2024-02-22: qty 5

## 2024-02-22 MED ORDER — OXYCODONE HCL 5 MG PO TABS
5.0000 mg | ORAL_TABLET | ORAL | Status: DC | PRN
  Administered 2024-02-22 – 2024-02-26 (×9): 5 mg via ORAL
  Filled 2024-02-22 (×10): qty 1

## 2024-02-22 MED ORDER — OXYCODONE HCL 5 MG/5ML PO SOLN: 5.0000 mg | ORAL | Status: DC | PRN

## 2024-02-22 NOTE — Plan of Care (Signed)

## 2024-02-22 NOTE — Progress Notes (Signed)
 [facility]                                                                               Jeffory Snelgrove, is a 73 y.o. male, DOB - [birth year], WUJ:[id number] Admit date - 02/14/2024    Outpatient Primary MD for the patient is [name], [name], MD  LOS - 8  days    Brief summary   Douglas Price is a 73 yo male with PMH depression, Parkinson's disease (not on treatment), insomnia, DMII, GERD, RLS who was brought to the hospital after being found on the floor at home soiled in urine. He had gone to work on Wednesday which was the last time he was reported to be in his normal state.  He typically ambulates with a walker at baseline and works at a school (chaperones students to various classrooms). A well check was performed on Friday after he didn't show up to school Thursday and Friday.  His sister thinks he may have been sitting on the edge of bed and leaned forward losing his balance and falling onto the ground.  Unknown whether he passed out before the fall or hit his face causing him to pass out afterwards. He underwent workup on admission and was found to be febrile with significant rhabdomyolysis and AKI which are progressively improving.    Medically stable for dc once placement confirmed.   Assessment & Plan    Assessment and Plan: * SIRS (systemic inflammatory response syndrome) (HCC)/ Aspiration pneumonia: - Presented with fever, tachycardia,, leukocytosis.  May be all due to rhabdomyolysis.  No significant source of infection at this time.  -Elevated lactic acid also considered in setting of volume depletion from rhabdo and not eating/drinking for probably 2 days - completed the course of Unasyn  . Continue with doxycycline .   AKI (acute kidney injury) (HCC) - baseline creatinine ~ 0.9 - patient presents with increase in creat >0.3 mg/dL above baseline or creat increase >1.5x baseline presumed to have occurred within past 7 days PTA - creat 1.56 on admission -  much improved with IV fluids.   Rhabdomyolysis - CK 19,111 on admission - presumed down on floor ~48 hours -  on IV fluids.  - CK very slowly downtrending; renal function has improved back to normal Recheck CK in am.   Acute urinary retention - Developed urinary retention worse on 02/17/2024 and Foley catheter placed; required coud cath due to difficulty with placement initially so would not remove until regains much more strength and cognition  Dysphagia Currently on dysphagia 3 diet.   Hypernatremia From free water  deficit, dehydration, and rhabdomyolysis.  Resolved .  Stop the IV fluids as he appears edematous.    Parkinson's disease (HCC) Recommend outpatient follow up with Dr [name].  He was started on Sinemet  and he is able to tolerate very well.   Conjunctivitis Resolved.     Gait disturbance - Multifactorial from underlying Parkinson's and deconditioning  Facial contusion - Continue supportive care -No acute fractures on trauma workup.  Soft tissue contusion over right orbit and forehead  Fall at home, initial encounter - Severely deconditioned - PT/OT ordered now that he is clinically improving - SNF  on discharge.   Transaminitis Presumed to be from rhabdomyolysis.   Multiple wounds of skin - No obvious signs of cellulitis - Continue wound care as per WOC nurse  Diabetic polyneuropathy associated with type 2 diabetes mellitus (HCC) - On Lyrica  at home  Depression Resume Wellbutrin , cymbalta  120 mg daily.   Insomnia Continue Restoril  and Remeron  and prn melatonin.   BPH (benign prostatic hyperplasia) - Continue Flomax  - Monitor for any signs of urinary retention  GERD (gastroesophageal reflux disease) - Continue Protonix   Hypokalemia Replaced.      RN Pressure Injury Documentation:    Malnutrition Type:  Nutrition Problem: Inadequate oral intake Etiology: dysphagia, inability to eat   Malnutrition Characteristics:  Signs/Symptoms:  NPO status   Nutrition Interventions:  Interventions: Tube feeding, MVI  Estimated body mass index is 24.69 kg/m as calculated from the following:   Height as of this encounter: 6\' 3"  (1.905 m).   Weight as of this encounter: 89.6 kg.  Code Status: full code.  DVT Prophylaxis:  heparin  injection 5,000 Units Start: 02/14/24 1515   Level of Care: Level of care: Progressive Family Communication: Updated patient's family at bedside.   Disposition Plan:     Remains inpatient appropriate:  pending SNF.   Procedures:  None.   Consultants:   Neurology.   Antimicrobials:   Anti-infectives (From admission, onward)    Start     Dose/Rate Route Frequency Ordered Stop   02/19/24 0915  doxycycline  (VIBRA -TABS) tablet 100 mg        100 mg Per Tube Every 12 hours 02/19/24 0904 02/24/24 0759   02/16/24 1300  Ampicillin -Sulbactam (UNASYN ) 3 g in sodium chloride  0.9 % 100 mL IVPB  Status:  Discontinued        3 g 200 mL/hr over 30 Minutes Intravenous Every 6 hours 02/16/24 1252 02/21/24 0937   02/15/24 1100  cefTRIAXone  (ROCEPHIN ) 2 g in sodium chloride  0.9 % 100 mL IVPB  Status:  Discontinued        2 g 200 mL/hr over 30 Minutes Intravenous Every 24 hours 02/14/24 1521 02/15/24 1225   02/14/24 1145  cefTRIAXone  (ROCEPHIN ) 2 g in sodium chloride  0.9 % 100 mL IVPB        2 g 200 mL/hr over 30 Minutes Intravenous Once 02/14/24 1142 02/14/24 1247        Medications  Scheduled Meds:  buPROPion   150 mg Per Tube BID   carbidopa -levodopa   1 tablet Per Tube TID   Chlorhexidine  Gluconate Cloth  6 each Topical Daily   doxycycline   100 mg Per Tube Q12H   DULoxetine   120 mg Oral Daily   dutasteride   0.5 mg Oral Daily   Gerhardt's butt cream   Topical BID   heparin   5,000 Units Subcutaneous Q8H   insulin  aspart  0-15 Units Subcutaneous TID WC   insulin  glargine-yfgn  10 Units Subcutaneous Daily   irbesartan   75 mg Oral Daily   lidocaine   1 Application Urethral Once   lithium  carbonate   150 mg Per Tube QHS   melatonin  3 mg Oral QHS   mirtazapine   15 mg Per Tube QHS   multivitamin with minerals  1 tablet Per Tube Daily   mupirocin  ointment   Topical BID   pantoprazole  (PROTONIX ) IV  40 mg Intravenous Q24H   pregabalin   150 mg Per Tube BID   sodium chloride  flush  3 mL Intravenous Q12H   Continuous Infusions: PRN Meds:.acetaminophen  (TYLENOL ) oral liquid 160 mg/5 mL,  acetaminophen  **OR** [DISCONTINUED] acetaminophen , albuterol , oxyCODONE , temazepam     Subjective:   Douglas Price was seen and examined today.    No new complaints.   Objective:   Vitals:   02/22/24 0019 02/22/24 0341 02/22/24 0808 02/22/24 1132  BP: (!) 142/63 (!) 170/67 (!) 170/81 (!) 154/79  Pulse: 61 66 67 73  Resp:   13 18  Temp: 98.4 F (36.9 C) 98.9 F (37.2 C) 98 F (36.7 C) 99 F (37.2 C)  TempSrc: Oral Oral Oral Axillary  SpO2: 96% 97% 98% 98%  Weight:      Height:        Intake/Output Summary (Last 24 hours) at 02/22/2024 1522 Last data filed at 02/22/2024 0200 Gross per 24 hour  Intake 240 ml  Output 2550 ml  Net -2310 ml   Filed Weights   02/16/24 2100  Weight: 89.6 kg     Exam General exam: Appears calm and comfortable  Respiratory system: Clear to auscultation. Respiratory effort normal. Cardiovascular system: S1 & S2 heard, RRR. No JVD,  Gastrointestinal system: Abdomen is nondistended, soft and nontender.  Central nervous system: Alert and oriented.  Extremities: edematous upper and lower extremities.  Skin: No rashes,  Psychiatry: Mood & affect appropriate.     Data Reviewed:  I have personally reviewed following labs and imaging studies   CBC Lab Results  Component Value Date   WBC 13.7 (H) 02/20/2024   RBC 3.98 (L) 02/20/2024   HGB 12.3 (L) 02/20/2024   HCT 39.7 02/20/2024   MCV 99.7 02/20/2024   MCH 30.9 02/20/2024   PLT 151 02/20/2024   MCHC 31.0 02/20/2024   RDW 14.2 02/20/2024   LYMPHSABS 1.5 02/20/2024   MONOABS 0.7 02/20/2024   EOSABS  0.6 (H) 02/20/2024   BASOSABS 0.0 02/20/2024     Last metabolic panel Lab Results  Component Value Date   NA 143 02/21/2024   K 4.3 02/21/2024   CL 108 02/21/2024   CO2 22 02/21/2024   BUN 19 02/21/2024   CREATININE 0.86 02/21/2024   GLUCOSE 263 (H) 02/21/2024   GFRNONAA >60 02/21/2024   GFRAA >60 03/07/2016   CALCIUM 7.1 (L) 02/21/2024   PROT 4.4 (L) 02/20/2024   ALBUMIN  2.0 (L) 02/20/2024   BILITOT 0.8 02/20/2024   ALKPHOS 40 02/20/2024   AST 121 (H) 02/20/2024   ALT 98 (H) 02/20/2024   ANIONGAP 13 02/21/2024    CBG (last 3)  Recent Labs    02/21/24 2118 02/22/24 0625 02/22/24 1136  GLUCAP 168* 107* 137*      Coagulation Profile: No results for input(s): "INR", "PROTIME" in the last 168 hours.   Radiology Studies: No results found.     Feliciana Horn M.D. [facility] 02/22/2024, 3:22 PM  Available via Epic secure chat 7am-7pm After 7 pm, please refer to night coverage provider listed on amion.

## 2024-02-23 DIAGNOSIS — R6510 Systemic inflammatory response syndrome (SIRS) of non-infectious origin without acute organ dysfunction: Secondary | ICD-10-CM | POA: Diagnosis not present

## 2024-02-23 DIAGNOSIS — N179 Acute kidney failure, unspecified: Secondary | ICD-10-CM | POA: Diagnosis not present

## 2024-02-23 DIAGNOSIS — E870 Hyperosmolality and hypernatremia: Secondary | ICD-10-CM | POA: Diagnosis not present

## 2024-02-23 DIAGNOSIS — M6282 Rhabdomyolysis: Secondary | ICD-10-CM | POA: Diagnosis not present

## 2024-02-23 LAB — GLUCOSE, CAPILLARY
Glucose-Capillary: 123 mg/dL — ABNORMAL HIGH (ref 70–99)
Glucose-Capillary: 131 mg/dL — ABNORMAL HIGH (ref 70–99)
Glucose-Capillary: 164 mg/dL — ABNORMAL HIGH (ref 70–99)
Glucose-Capillary: 153 mg/dL — ABNORMAL HIGH (ref 70–99)

## 2024-02-23 LAB — BASIC METABOLIC PANEL WITH GFR
Anion gap: 9 (ref 5–15)
BUN: 18 mg/dL (ref 8–23)
CO2: 27 mmol/L (ref 22–32)
Calcium: 7.8 mg/dL — ABNORMAL LOW (ref 8.9–10.3)
Chloride: 107 mmol/L (ref 98–111)
Creatinine, Ser: 0.8 mg/dL (ref 0.61–1.24)
GFR, Estimated: >60 mL/min (ref >60)
Glucose, Bld: 132 mg/dL — ABNORMAL HIGH (ref 70–99)
Potassium: 3.6 mmol/L (ref 3.5–5.1)
Sodium: 143 mmol/L (ref 135–145)

## 2024-02-23 LAB — CK: Total CK: 288 U/L (ref 49–397)

## 2024-02-23 LAB — HEPATIC FUNCTION PANEL
ALT: 45 U/L — ABNORMAL HIGH (ref 0–44)
AST: 61 U/L — ABNORMAL HIGH (ref 15–41)
Albumin: 2 g/dL — ABNORMAL LOW (ref 3.5–5.0)
Alkaline Phosphatase: 44 U/L (ref 38–126)
Bilirubin, Direct: <0.1 mg/dL (ref 0.0–0.2)
Total Bilirubin: 0.6 mg/dL (ref 0.0–1.2)
Total Protein: 4.6 g/dL — ABNORMAL LOW (ref 6.5–8.1)

## 2024-02-23 MED ORDER — FUROSEMIDE 40 MG PO TABS
40.0000 mg | ORAL_TABLET | Freq: Every day | ORAL | Status: DC
  Administered 2024-02-23 – 2024-02-24 (×2): 40 mg via ORAL
  Filled 2024-02-23 (×3): qty 1

## 2024-02-23 MED ORDER — HYDRALAZINE HCL 20 MG/ML IJ SOLN
10.0000 mg | Freq: Four times a day (QID) | INTRAMUSCULAR | Status: DC | PRN
  Administered 2024-02-23: 10 mg via INTRAVENOUS
  Filled 2024-02-23: qty 1

## 2024-02-23 MED ORDER — ERYTHROMYCIN 5 MG/GM OP OINT
TOPICAL_OINTMENT | Freq: Two times a day (BID) | OPHTHALMIC | Status: DC
  Administered 2024-02-23 – 2024-02-27 (×9): 1 via OPHTHALMIC
  Filled 2024-02-23: qty 1

## 2024-02-23 NOTE — Plan of Care (Signed)

## 2024-02-23 NOTE — Progress Notes (Signed)
 [facility]                                                                               Douglas Price, is a 73 y.o. male, DOB - 09-22-1951, HQI:[id number] Admit date - 02/14/2024    Outpatient Primary MD for the patient is [name], [name], MD  LOS - 9  days    Brief summary   Douglas Price is a 73 yo male with PMH depression, Parkinson's disease (not on treatment), insomnia, DMII, GERD, RLS who was brought to the hospital after being found on the floor at home soiled in urine. He had gone to work on Wednesday which was the last time he was reported to be in his normal state.  He typically ambulates with a walker at baseline and works at a school (chaperones students to various classrooms). A well check was performed on Friday after he didn't show up to school Thursday and Friday.  His sister thinks he may have been sitting on the edge of bed and leaned forward losing his balance and falling onto the ground.  Unknown whether he passed out before the fall or hit his face causing him to pass out afterwards. He underwent workup on admission and was found to be febrile with significant rhabdomyolysis and AKI which are progressively improving.    Medically stable for dc once placement confirmed.   Assessment & Plan    Assessment and Plan: * SIRS (systemic inflammatory response syndrome) (HCC)/ Aspiration pneumonia: - Presented with fever, tachycardia,, leukocytosis.  May be all due to rhabdomyolysis.  No significant source of infection at this time.  -Elevated lactic acid also considered in setting of volume depletion from rhabdo and not eating/drinking for probably 2 days - completed the course of Unasyn  . Continue with doxycycline .   AKI (acute kidney injury) (HCC) - baseline creatinine ~ 0.9 - patient presents with increase in creat >0.3 mg/dL above baseline or creat increase >1.5x baseline presumed to have occurred within past 7 days PTA - creat 1.56 on admission -  much improved with IV fluids.   Rhabdomyolysis - CK 19,111 on admission - presumed down on floor ~48 hours -  on IV fluids.  - CK very slowly downtrending; renal function has improved back to normal Recheck CK in am.   Acute urinary retention - Developed urinary retention worse on 02/17/2024 and Foley catheter placed; required coud cath due to difficulty with placement initially so would not remove until regains much more strength and cognition  Dysphagia Currently on dysphagia 3 diet.   Hypernatremia From free water  deficit, dehydration, and rhabdomyolysis.  Resolved .  Stop the IV fluids as he appears edematous.  Sodium is 143 today.    Parkinson's disease (HCC) Recommend outpatient follow up with Dr [name].  He was started on Sinemet  and he is able to tolerate very well.   Conjunctivitis Resolved.    Gait disturbance - Multifactorial from underlying Parkinson's and deconditioning  Facial contusion - Continue supportive care -No acute fractures on trauma workup.  Soft tissue contusion over right orbit and forehead  Fall at home, initial encounter - Severely deconditioned - PT/OT ordered now that he is  clinically improving - SNF on discharge.   Transaminitis Presumed to be from rhabdomyolysis.  Recheck liver panel today.   Multiple wounds of skin - No obvious signs of cellulitis - Continue wound care as per WOC nurse  Diabetic polyneuropathy associated with type 2 diabetes mellitus (HCC) - On Lyrica  at home  Depression Resume Wellbutrin , cymbalta  120 mg daily.  Continue with lithium  150  mg daily.   Insomnia Continue Restoril  and Remeron  and prn melatonin.   BPH (benign prostatic hyperplasia) - Continue Flomax  - Monitor for any signs of urinary retention  GERD (gastroesophageal reflux disease) - Continue Protonix   Hypokalemia Replaced.    Type 2 DM well controlled CBG'S   CBG (last 3)  Recent Labs    02/22/24 1629 02/22/24 2118 02/23/24 0615   GLUCAP 151* 188* 123*   Resume SSI.  A1c is 5.6% Currently on 10 units of Semglee  and SSI.    Hypertension Suboptimally controlled.  Continue with   RN Pressure Injury Documentation:    Malnutrition Type:  Nutrition Problem: Inadequate oral intake Etiology: dysphagia, inability to eat   Malnutrition Characteristics:  Signs/Symptoms: NPO status   Nutrition Interventions:  Interventions: Tube feeding, MVI  Estimated body mass index is 24.69 kg/m as calculated from the following:   Height as of this encounter: 6\' 3"  (1.905 m).   Weight as of this encounter: 89.6 kg.  Code Status: full code.  DVT Prophylaxis:  heparin  injection 5,000 Units Start: 02/14/24 1515   Level of Care: Level of care: Progressive Family Communication: Updated patient's family at bedside.   Disposition Plan:     Remains inpatient appropriate:  pending SNF.   Procedures:  None.   Consultants:   Neurology.   Antimicrobials:   Anti-infectives (From admission, onward)    Start     Dose/Rate Route Frequency Ordered Stop   02/19/24 0915  doxycycline  (VIBRA -TABS) tablet 100 mg        100 mg Per Tube Every 12 hours 02/19/24 0904 02/24/24 0759   02/16/24 1300  Ampicillin -Sulbactam (UNASYN ) 3 g in sodium chloride  0.9 % 100 mL IVPB  Status:  Discontinued        3 g 200 mL/hr over 30 Minutes Intravenous Every 6 hours 02/16/24 1252 02/21/24 0937   02/15/24 1100  cefTRIAXone  (ROCEPHIN ) 2 g in sodium chloride  0.9 % 100 mL IVPB  Status:  Discontinued        2 g 200 mL/hr over 30 Minutes Intravenous Every 24 hours 02/14/24 1521 02/15/24 1225   02/14/24 1145  cefTRIAXone  (ROCEPHIN ) 2 g in sodium chloride  0.9 % 100 mL IVPB        2 g 200 mL/hr over 30 Minutes Intravenous Once 02/14/24 1142 02/14/24 1247        Medications  Scheduled Meds:  buPROPion   150 mg Per Tube BID   carbidopa -levodopa   1 tablet Per Tube TID   Chlorhexidine  Gluconate Cloth  6 each Topical Daily   doxycycline   100 mg  Per Tube Q12H   DULoxetine   120 mg Oral Daily   dutasteride   0.5 mg Oral Daily   Gerhardt's butt cream   Topical BID   heparin   5,000 Units Subcutaneous Q8H   insulin  aspart  0-15 Units Subcutaneous TID WC   insulin  glargine-yfgn  10 Units Subcutaneous Daily   irbesartan   75 mg Oral Daily   ketotifen  1 drop Both Eyes BID   lidocaine   1 Application Urethral Once   lithium  carbonate  150 mg Per  Tube QHS   melatonin  3 mg Oral QHS   mirtazapine   15 mg Per Tube QHS   multivitamin with minerals  1 tablet Per Tube Daily   mupirocin  ointment   Topical BID   pantoprazole  (PROTONIX ) IV  40 mg Intravenous Q24H   pregabalin   150 mg Per Tube BID   sodium chloride  flush  3 mL Intravenous Q12H   Continuous Infusions: PRN Meds:.acetaminophen  (TYLENOL ) oral liquid 160 mg/5 mL, acetaminophen  **OR** [DISCONTINUED] acetaminophen , albuterol , oxyCODONE , temazepam     Subjective:   Akul Leggette was seen and examined today.   No new complaints today.   Objective:   Vitals:   02/22/24 2350 02/22/24 2350 02/23/24 0437 02/23/24 0750  BP: (!) 147/59 (!) 147/59 (!) 153/76 (!) 171/76  Pulse: 63 63 65 73  Resp:      Temp: 98.3 F (36.8 C) 98.3 F (36.8 C) 98.4 F (36.9 C) 98 F (36.7 C)  TempSrc: Oral Oral Oral Axillary  SpO2: 97% 97% 99% 96%  Weight:      Height:        Intake/Output Summary (Last 24 hours) at 02/23/2024 1049 Last data filed at 02/23/2024 0600 Gross per 24 hour  Intake 240 ml  Output 2800 ml  Net -2560 ml   Filed Weights   02/16/24 2100  Weight: 89.6 kg     Exam General exam: Appears calm and comfortable  Respiratory system: Clear to auscultation. Respiratory effort normal. Cardiovascular system: S1 & S2 heard, RRR. No JVD,  Gastrointestinal system: Abdomen is nondistended, soft and nontender.  Central nervous system: Alert and oriented to person and place.  Extremities: feet are bandaged.  Skin: No rashes,  Psychiatry: Mood & affect appropriate.     Data  Reviewed:  I have personally reviewed following labs and imaging studies   CBC Lab Results  Component Value Date   WBC 13.7 (H) 02/20/2024   RBC 3.98 (L) 02/20/2024   HGB 12.3 (L) 02/20/2024   HCT 39.7 02/20/2024   MCV 99.7 02/20/2024   MCH 30.9 02/20/2024   PLT 151 02/20/2024   MCHC 31.0 02/20/2024   RDW 14.2 02/20/2024   LYMPHSABS 1.5 02/20/2024   MONOABS 0.7 02/20/2024   EOSABS 0.6 (H) 02/20/2024   BASOSABS 0.0 02/20/2024     Last metabolic panel Lab Results  Component Value Date   NA 143 02/23/2024   K 3.6 02/23/2024   CL 107 02/23/2024   CO2 27 02/23/2024   BUN 18 02/23/2024   CREATININE 0.80 02/23/2024   GLUCOSE 132 (H) 02/23/2024   GFRNONAA >60 02/23/2024   GFRAA >60 03/07/2016   CALCIUM 7.8 (L) 02/23/2024   PROT 4.4 (L) 02/20/2024   ALBUMIN  2.0 (L) 02/20/2024   BILITOT 0.8 02/20/2024   ALKPHOS 40 02/20/2024   AST 121 (H) 02/20/2024   ALT 98 (H) 02/20/2024   ANIONGAP 9 02/23/2024    CBG (last 3)  Recent Labs    02/22/24 1629 02/22/24 2118 02/23/24 0615  GLUCAP 151* 188* 123*      Coagulation Profile: No results for input(s): "INR", "PROTIME" in the last 168 hours.   Radiology Studies: No results found.     Feliciana Horn M.D. [facility] 02/23/2024, 10:49 AM  Available via Epic secure chat 7am-7pm After 7 pm, please refer to night coverage provider listed on amion.

## 2024-02-24 DIAGNOSIS — M6282 Rhabdomyolysis: Secondary | ICD-10-CM | POA: Diagnosis not present

## 2024-02-24 DIAGNOSIS — R6510 Systemic inflammatory response syndrome (SIRS) of non-infectious origin without acute organ dysfunction: Secondary | ICD-10-CM | POA: Diagnosis not present

## 2024-02-24 DIAGNOSIS — N179 Acute kidney failure, unspecified: Secondary | ICD-10-CM | POA: Diagnosis not present

## 2024-02-24 DIAGNOSIS — E870 Hyperosmolality and hypernatremia: Secondary | ICD-10-CM | POA: Diagnosis not present

## 2024-02-24 LAB — CBC WITH DIFFERENTIAL/PLATELET
Abs Immature Granulocytes: 0.36 10*3/uL — ABNORMAL HIGH (ref 0.00–0.07)
Basophils Absolute: 0 10*3/uL (ref 0.0–0.1)
Basophils Relative: 0 %
Eosinophils Absolute: 0.3 10*3/uL (ref 0.0–0.5)
Eosinophils Relative: 1 %
HCT: 34.7 % — ABNORMAL LOW (ref 39.0–52.0)
Hemoglobin: 11.2 g/dL — ABNORMAL LOW (ref 13.0–17.0)
Immature Granulocytes: 2 %
Lymphocytes Relative: 7 %
Lymphs Abs: 1.5 10*3/uL (ref 0.7–4.0)
MCH: 30.3 pg (ref 26.0–34.0)
MCHC: 32.3 g/dL (ref 30.0–36.0)
MCV: 93.8 fL (ref 80.0–100.0)
Monocytes Absolute: 1 10*3/uL (ref 0.1–1.0)
Monocytes Relative: 5 %
Neutro Abs: 17.3 10*3/uL — ABNORMAL HIGH (ref 1.7–7.7)
Neutrophils Relative %: 85 %
Platelets: 269 10*3/uL (ref 150–400)
RBC: 3.7 MIL/uL — ABNORMAL LOW (ref 4.22–5.81)
RDW: 14.4 % (ref 11.5–15.5)
WBC: 20.5 10*3/uL — ABNORMAL HIGH (ref 4.0–10.5)
nRBC: 0.1 % (ref 0.0–0.2)

## 2024-02-24 LAB — GLUCOSE, CAPILLARY
Glucose-Capillary: 102 mg/dL — ABNORMAL HIGH (ref 70–99)
Glucose-Capillary: 136 mg/dL — ABNORMAL HIGH (ref 70–99)
Glucose-Capillary: 113 mg/dL — ABNORMAL HIGH (ref 70–99)
Glucose-Capillary: 202 mg/dL — ABNORMAL HIGH (ref 70–99)

## 2024-02-24 LAB — BASIC METABOLIC PANEL WITH GFR
Anion gap: 11 (ref 5–15)
BUN: 15 mg/dL (ref 8–23)
CO2: 28 mmol/L (ref 22–32)
Calcium: 8.5 mg/dL — ABNORMAL LOW (ref 8.9–10.3)
Chloride: 101 mmol/L (ref 98–111)
Creatinine, Ser: 0.77 mg/dL (ref 0.61–1.24)
GFR, Estimated: >60 mL/min (ref >60)
Glucose, Bld: 124 mg/dL — ABNORMAL HIGH (ref 70–99)
Potassium: 3.8 mmol/L (ref 3.5–5.1)
Sodium: 140 mmol/L (ref 135–145)

## 2024-02-24 LAB — CULTURE, BLOOD (ROUTINE X 2)
Culture: NO GROWTH
Culture: NO GROWTH
Special Requests: ADEQUATE

## 2024-02-24 MED ORDER — CARBIDOPA-LEVODOPA 25-100 MG PO TABS
1.0000 | ORAL_TABLET | Freq: Three times a day (TID) | ORAL | Status: DC
  Administered 2024-02-24 – 2024-02-27 (×9): 1 via ORAL
  Filled 2024-02-24 (×9): qty 1

## 2024-02-24 MED ORDER — BUPROPION HCL 75 MG PO TABS
150.0000 mg | ORAL_TABLET | Freq: Two times a day (BID) | ORAL | Status: DC
  Administered 2024-02-24 – 2024-02-27 (×6): 150 mg via ORAL
  Filled 2024-02-24 (×7): qty 2

## 2024-02-24 MED ORDER — ADULT MULTIVITAMIN W/MINERALS CH
1.0000 | ORAL_TABLET | Freq: Every day | ORAL | Status: DC
  Administered 2024-02-25 – 2024-02-27 (×3): 1 via ORAL
  Filled 2024-02-24 (×3): qty 1

## 2024-02-24 MED ORDER — MIRTAZAPINE 15 MG PO TABS
15.0000 mg | ORAL_TABLET | Freq: Every day | ORAL | Status: DC
  Administered 2024-02-24 – 2024-02-26 (×3): 15 mg via ORAL
  Filled 2024-02-24 (×3): qty 1

## 2024-02-24 MED ORDER — PANTOPRAZOLE SODIUM 40 MG PO TBEC
40.0000 mg | DELAYED_RELEASE_TABLET | Freq: Every day | ORAL | Status: DC
  Administered 2024-02-24 – 2024-02-26 (×3): 40 mg via ORAL
  Filled 2024-02-24 (×3): qty 1

## 2024-02-24 NOTE — TOC Progression Note (Signed)
 Transition of Care [facility]) - Progression Note    Patient Details  Name: Douglas Price MRN: [id number] Date of Birth: Jun 07, 1951  [facility] of [facility] CM/SW Contact  Tandy Fam, [place] Phone Number: 02/24/2024, 3:01 PM  Clinical Narrative:   CSW met with patient and brother, [place], at bedside to discuss SNF options. [place] will be touring [employer] this afternoon, and will update CSW afterwards with decision. CSW received call from [place] after tour and he would like to choose [facility]. CSW asked [facility] to initiate insurance authorization. CSW to follow.    Expected Discharge Plan: Skilled Nursing Facility Barriers to Discharge: Continued Medical Work up, English as a second language teacher  Expected Discharge Plan and Services   Discharge Planning Services: CM Consult Post Acute Care Choice: IP Rehab Living arrangements for the past 2 months: Apartment                                       Social Determinants of Health (SDOH) Interventions SDOH Screenings   Food Insecurity: No Food Insecurity (02/16/2024)  Housing: Low Risk  (02/16/2024)  Transportation Needs: No Transportation Needs (02/16/2024)  Utilities: Not At Risk (02/16/2024)  Depression (PHQ2-9): Medium Risk (02/11/2023)  Financial Resource Strain: Low Risk  (02/07/2023)  Social Connections: Moderately Isolated (02/16/2024)  Tobacco Use: High Risk (02/20/2024)    Readmission Risk Interventions     No data to display

## 2024-02-24 NOTE — Progress Notes (Signed)
 [facility]                                                                               Douglas Price, is a 73 y.o. male, DOB - [birth year], ZOX:[id number] Admit date - 02/14/2024    Outpatient Primary MD for the patient is Douglas, [name], MD  LOS - 10  days    Brief summary   [name] is a 74 yo male with PMH depression, Parkinson's disease (not on treatment), insomnia, DMII, GERD, RLS who was brought to the hospital after being found on the floor at home soiled in urine. He had gone to work on Wednesday which was the last time he was reported to be in his normal state.  He typically ambulates with a walker at baseline and works at a school (chaperones students to various classrooms). A well check was performed on Friday after he didn't show up to school Thursday and Friday.  His sister thinks he may have been sitting on the edge of bed and leaned forward losing his balance and falling onto the ground.  Unknown whether he passed out before the fall or hit his face causing him to pass out afterwards. He underwent workup on admission and was found to be febrile with significant rhabdomyolysis and AKI which are progressively improving.    Medically stable for dc once placement confirmed.   Assessment & Plan    Assessment and Plan: * SIRS (systemic inflammatory response syndrome) (HCC)/ Aspiration pneumonia: - Presented with fever, tachycardia,, leukocytosis.  May be all due to rhabdomyolysis.  No significant source of infection at this time.  -Elevated lactic acid also considered in setting of volume depletion from rhabdo and not eating/drinking for probably 2 days - completed the course of unasyn  and doxycycline .  Repeat CXR in am.   AKI (acute kidney injury) (HCC) - baseline creatinine ~ 0.9 - patient presents with increase in creat >0.3 mg/dL above baseline or creat increase >1.5x baseline presumed to have occurred within past 7 days PTA - creat 1.56 on  admission,  back to baseline today.   Rhabdomyolysis - CK 19,111 on admission - presumed down on floor ~48 hours -  on IV fluids.  - CK levels normalized.   Acute urinary retention - Developed urinary retention worse on 02/17/2024 and Foley catheter placed; required coud cath due to difficulty with placement initially so would not remove until regains much more strength and cognition  Dysphagia Currently on dysphagia 3 diet.   Hypernatremia From free water  deficit, dehydration, and rhabdomyolysis.  Resolved .  Stop the IV fluids as he appears edematous.  Sodium is 143 today.    Parkinson's disease (HCC) Recommend outpatient follow up with Dr [name].  He was started on Sinemet  and he is able to tolerate very well.   Conjunctivitis Resolved.    Gait disturbance - Multifactorial from underlying Parkinson's and deconditioning  Facial contusion - Continue supportive care -No acute fractures on trauma workup.  Soft tissue contusion over right orbit and forehead  Fall at home, initial encounter - Severely deconditioned - PT/OT ordered now that he is clinically improving - SNF on discharge.   Transaminitis Presumed  to be from rhabdomyolysis.  Recheck liver panel today.   Multiple wounds of skin - No obvious signs of cellulitis - Continue wound care as per WOC nurse  Diabetic polyneuropathy associated with type 2 diabetes mellitus (HCC) - On Lyrica  at home  Depression Resume Wellbutrin , cymbalta  120 mg daily.  Continue with lithium  150  mg daily.   Insomnia Continue Restoril  and Remeron  and prn melatonin.   BPH (benign prostatic hyperplasia) - Continue Flomax  - Monitor for any signs of urinary retention  GERD (gastroesophageal reflux disease) - Continue Protonix   Hypokalemia Replaced.    Type 2 DM well controlled CBG'S   CBG (last 3)  Recent Labs    02/24/24 0615 02/24/24 1240 02/24/24 1619  GLUCAP 102* 136* 113*   Resume SSI.  A1c is  5.6% Currently on 10 units of Semglee  and SSI.    Hypertension Better controlled.    Leukocytosis  Unclear etiology.  Check procalcitonin.   RN Pressure Injury Documentation:    Malnutrition Type:  Nutrition Problem: Inadequate oral intake Etiology: dysphagia, inability to eat   Malnutrition Characteristics:  Signs/Symptoms: NPO status   Nutrition Interventions:  Interventions: Tube feeding, MVI  Estimated body mass index is 24.69 kg/m as calculated from the following:   Height as of this encounter: 6\' 3"  (1.905 m).   Weight as of this encounter: 89.6 kg.  Code Status: full code.  DVT Prophylaxis:  heparin  injection 5,000 Units Start: 02/14/24 1515   Level of Care: Level of care: Progressive Family Communication: Updated patient's family at bedside.   Disposition Plan:     Remains inpatient appropriate:  pending SNF.   Procedures:  None.   Consultants:   Neurology.   Antimicrobials:   Anti-infectives (From admission, onward)    Start     Dose/Rate Route Frequency Ordered Stop   02/19/24 0915  doxycycline  (VIBRA -TABS) tablet 100 mg        100 mg Per Tube Every 12 hours 02/19/24 0904 02/23/24 1946   02/16/24 1300  Ampicillin -Sulbactam (UNASYN ) 3 g in sodium chloride  0.9 % 100 mL IVPB  Status:  Discontinued        3 g 200 mL/hr over 30 Minutes Intravenous Every 6 hours 02/16/24 1252 02/21/24 0937   02/15/24 1100  cefTRIAXone  (ROCEPHIN ) 2 g in sodium chloride  0.9 % 100 mL IVPB  Status:  Discontinued        2 g 200 mL/hr over 30 Minutes Intravenous Every 24 hours 02/14/24 1521 02/15/24 1225   02/14/24 1145  cefTRIAXone  (ROCEPHIN ) 2 g in sodium chloride  0.9 % 100 mL IVPB        2 g 200 mL/hr over 30 Minutes Intravenous Once 02/14/24 1142 02/14/24 1247        Medications  Scheduled Meds:  buPROPion   150 mg Oral BID   carbidopa -levodopa   1 tablet Oral TID   Chlorhexidine  Gluconate Cloth  6 each Topical Daily   DULoxetine   120 mg Oral Daily    dutasteride   0.5 mg Oral Daily   erythromycin    Both Eyes BID   furosemide  40 mg Oral Daily   Gerhardt's butt cream   Topical BID   heparin   5,000 Units Subcutaneous Q8H   insulin  aspart  0-15 Units Subcutaneous TID WC   insulin  glargine-yfgn  10 Units Subcutaneous Daily   irbesartan   75 mg Oral Daily   ketotifen  1 drop Both Eyes BID   lidocaine   1 Application Urethral Once   lithium  carbonate  150  mg Per Tube QHS   melatonin  3 mg Oral QHS   mirtazapine   15 mg Oral QHS   [START ON 02/25/2024] multivitamin with minerals  1 tablet Oral Daily   mupirocin  ointment   Topical BID   pantoprazole  (PROTONIX ) IV  40 mg Intravenous Q24H   pregabalin   150 mg Per Tube BID   sodium chloride  flush  3 mL Intravenous Q12H   Continuous Infusions: PRN Meds:.acetaminophen  (TYLENOL ) oral liquid 160 mg/5 mL, acetaminophen  **OR** [DISCONTINUED] acetaminophen , albuterol , hydrALAZINE , oxyCODONE , temazepam     Subjective:   Douglas Price was seen and examined today.  Wants the rectal tube to be removed.   Objective:   Vitals:   02/24/24 0401 02/24/24 0821 02/24/24 1243 02/24/24 1623  BP: (!) 147/67 (!) 151/77 (!) 143/67 (!) 144/72  Pulse: (!) 59 71 82 74  Resp: 14 12  17   Temp: (!) 97.5 F (36.4 C) 98.2 F (36.8 C) 98.4 F (36.9 C) (P) 98.6 F (37 C)  TempSrc: Oral Oral Axillary (P) Axillary  SpO2: 98% 97% 96% 90%  Weight:      Height:        Intake/Output Summary (Last 24 hours) at 02/24/2024 1857 Last data filed at 02/24/2024 1412 Gross per 24 hour  Intake 240 ml  Output 3900 ml  Net -3660 ml   Filed Weights   02/16/24 2100  Weight: 89.6 kg     Exam General exam: Appears calm and comfortable  Respiratory system: Clear to auscultation. Respiratory effort normal. Cardiovascular system: S1 & S2 heard, RRR. No JVD, Gastrointestinal system: Abdomen is nondistended, soft and nontender. Central nervous system: Alert and oriented.  Extremities: upper extremity edema improving.  Skin:  No rashes,  Psychiatry: Mood & affect appropriate.      Data Reviewed:  I have personally reviewed following labs and imaging studies   CBC Lab Results  Component Value Date   WBC 20.5 (H) 02/24/2024   RBC 3.70 (L) 02/24/2024   HGB 11.2 (L) 02/24/2024   HCT 34.7 (L) 02/24/2024   MCV 93.8 02/24/2024   MCH 30.3 02/24/2024   PLT 269 02/24/2024   MCHC 32.3 02/24/2024   RDW 14.4 02/24/2024   LYMPHSABS 1.5 02/24/2024   MONOABS 1.0 02/24/2024   EOSABS 0.3 02/24/2024   BASOSABS 0.0 02/24/2024     Last metabolic panel Lab Results  Component Value Date   NA 140 02/24/2024   K 3.8 02/24/2024   CL 101 02/24/2024   CO2 28 02/24/2024   BUN 15 02/24/2024   CREATININE 0.77 02/24/2024   GLUCOSE 124 (H) 02/24/2024   GFRNONAA >60 02/24/2024   GFRAA >60 03/07/2016   CALCIUM 8.5 (L) 02/24/2024   PROT 4.6 (L) 02/23/2024   ALBUMIN  2.0 (L) 02/23/2024   BILITOT 0.6 02/23/2024   ALKPHOS 44 02/23/2024   AST 61 (H) 02/23/2024   ALT 45 (H) 02/23/2024   ANIONGAP 11 02/24/2024    CBG (last 3)  Recent Labs    02/24/24 0615 02/24/24 1240 02/24/24 1619  GLUCAP 102* 136* 113*      Coagulation Profile: No results for input(s): "INR", "PROTIME" in the last 168 hours.   Radiology Studies: No results found.     Feliciana Horn M.D. [facility] 02/24/2024, 6:57 PM  Available via Epic secure chat 7am-7pm After 7 pm, please refer to night coverage provider listed on amion.

## 2024-02-24 NOTE — Progress Notes (Signed)
 Nutrition Follow-up  DOCUMENTATION CODES:  Not applicable  INTERVENTION:  Continue current diet as ordered Encourage PO intake MVI with minerals daily  NUTRITION DIAGNOSIS:  Inadequate oral intake related to dysphagia, inability to eat as evidenced by NPO status. - progressing  GOAL:  Patient will meet greater than or equal to 90% of their needs - progressing  MONITOR:  TF tolerance, Skin, Labs  REASON FOR ASSESSMENT:  Consult Enteral/tube feeding initiation and management  ASSESSMENT:  73 yo male admitted after fall at home and being on the floor for 2 days with AKI, rhabdomyolysis, multiple skin wounds. PMH includes Parkinson's disease, DM-2 with polyneuropathy, depression, RLS, tobacco abuse, GERD.  5/23 - admitted 5/28 - cortrak placed 5/29 - MBS, DYS 3 5/30 - BSE, regular diet, thin liquids  Pt resting in bed at the time of assessment. Breakfast tray at bedside well consumed. Pt and brother report that pt has been eating great, happy to have cortrak tube out. Did discuss CBGs as pt is not usually on insulin  and brother concerned with the need while admitted. Did relay that CBGs are typically elevated while inpatient due to stress and poor sleep but that they seem to be improving now that pt is off continuous feeds. A1c prior to admission 5.6%, will likely improve once back in home setting.   Admit / Current weight: 89.6 kg    Nutritionally Relevant Medications: Scheduled Meds:  furosemide  40 mg Oral Daily   insulin  aspart  0-15 Units Subcutaneous TID WC   insulin  glargine-yfgn  10 Units Subcutaneous Daily   mirtazapine   15 mg Per Tube QHS   multivitamin with minerals  1 tablet Per Tube Daily   pantoprazole  IV  40 mg Intravenous Q24H   Labs Reviewed: CBG ranges from 102-164 mg/dL over the last 24 hours HgbA1c 5.6%  Diet Order:   Diet Order             Diet regular Room service appropriate? Yes with Assist; Fluid consistency: Thin  Diet effective now                    EDUCATION NEEDS:  No education needs have been identified at this time  Skin: Skin Integrity Issues: Stage 2: - left knee  - right shoulder Deep Tissue Pressure Injury: - left hip - right rib  MASD: - sacrum  Last BM:  6/1 - type 6  Height:  Ht Readings from Last 1 Encounters:  02/16/24 6\' 3"  (1.905 m)    Weight:  Wt Readings from Last 1 Encounters:  02/16/24 89.6 kg    Ideal Body Weight:  89.1 kg  BMI:  Body mass index is 24.69 kg/m.  Estimated Nutritional Needs:  Kcal:  2200-2400 Protein:  110-130 gm Fluid:  2.2-2.4 L    Edwena Graham, RD, LDN Registered Dietitian II Please reach out via secure chat

## 2024-02-24 NOTE — Progress Notes (Signed)
 Physical Therapy Treatment Patient Details Name: Douglas Price MRN: [id number] DOB: 07/28/1951 Today's Date: 02/24/2024   History of Present Illness Patient is a 73 yo male presenting to the ED after being found down for an unknown period of time on 02/14/24. Admitted to with SIRS, AKI, rhabdomyolysis. PMH includes: DM, restless leg, Parkinson's, spinal stenosis, diverticulosis    PT Comments  Patient progressing with sit to stand x 2 this session in Stedy helping him lean forward enough to lift up though L LE in extension and R LE in flexion and unable to achieve upright long enough or safely enough to use flaps for transfer OOB.  PAtient also with considerable posterior bias and risk for sliding forward in recliner.  Feel he remains appropriate for inpatient rehab (<3 hours/day) at d/c.     If plan is discharge home, recommend the following: Two people to help with walking and/or transfers;Assistance with cooking/housework;Assist for transportation;Help with stairs or ramp for entrance   Can travel by private vehicle     No  Equipment Recommendations  Wheelchair cushion (measurements PT);Hospital bed;Wheelchair (measurements PT);BSC/3in1    Recommendations for Other Services       Precautions / Restrictions Precautions Precautions: Fall Recall of Precautions/Restrictions: Impaired     Mobility  Bed Mobility Overal bed mobility: Needs Assistance Bed Mobility: Supine to Sit     Supine to sit: HOB elevated, Used rails, Max assist, +2 for physical assistance Sit to supine: +2 for physical assistance, Max assist   General bed mobility comments: scooting to EOB with A using pad under pt, HOB up and pt needing max A of 2 to lift trunk; to supine A for legs and trunk and to scoot to [facility]    Transfers Overall transfer level: Needs assistance   Transfers: Sit to/from Stand Sit to Stand: From elevated surface, Via lift equipment           General transfer comment: able to keep  trunk forward holding Stedy with L hand and lifting to stand x 2 briefly though not safe to lower flaps for OOB transfer due to L LE extension R LE flexion in standing Transfer via Lift Equipment: Stedy  Ambulation/Gait                   Stairs             Wheelchair Mobility     Tilt Bed    Modified Rankin (Stroke Patients Only)       Balance Overall balance assessment: Needs assistance Sitting-balance support: Bilateral upper extremity supported Sitting balance-Leahy Scale: Poor Sitting balance - Comments: Heavy posterior lean, Max A required to maintain, brief moments of min A but fleeting   Standing balance support: Bilateral upper extremity supported Standing balance-Leahy Scale: Zero Standing balance comment: up to stand with +2 A x 2                            Communication    Cognition Arousal: Alert Behavior During Therapy: WFL for tasks assessed/performed   PT - Cognitive impairments: Awareness, Problem solving, Safety/Judgement, Sequencing, Initiation                       PT - Cognition Comments: slow processing, slow initiation, able to determine need to return to supine due to fatigue   Following commands impaired: Only follows one step commands consistently, Follows one step commands with increased  time    Cueing    Exercises      General Comments General comments (skin integrity, edema, etc.): flexiseal in place      Pertinent Vitals/Pain Pain Assessment Pain Assessment: Faces Faces Pain Scale: Hurts a little bit Pain Location: Generalized with movement Pain Descriptors / Indicators: Grimacing, Discomfort, Guarding Pain Intervention(s): Monitored during session, Repositioned    Home Living                          Prior Function            PT Goals (current goals can now be found in the care plan section) Progress towards PT goals: Progressing toward goals    Frequency    Min  2X/week      PT Plan      Co-evaluation              AM-PAC PT "6 Clicks" Mobility   Outcome Measure  Help needed turning from your back to your side while in a flat bed without using bedrails?: A Lot Help needed moving from lying on your back to sitting on the side of a flat bed without using bedrails?: Total Help needed moving to and from a bed to a chair (including a wheelchair)?: Total Help needed standing up from a chair using your arms (e.g., wheelchair or bedside chair)?: Total Help needed to walk in hospital room?: Total Help needed climbing 3-5 steps with a railing? : Total 6 Click Score: 7    End of Session Equipment Utilized During Treatment: Gait belt Activity Tolerance: Patient limited by fatigue Patient left: in bed;with call bell/phone within reach   PT Visit Diagnosis: Unsteadiness on feet (R26.81);Other abnormalities of gait and mobility (R26.89);Muscle weakness (generalized) (M62.81);History of falling (Z91.81)     Time: 1610-9604 PT Time Calculation (min) (ACUTE ONLY): 33 min  Charges:    $Therapeutic Activity: 23-37 mins PT General Charges $$ ACUTE PT VISIT: 1 Visit                     Abigail Hoff, PT Acute Rehabilitation Services Office:[phone] 02/24/2024    Douglas Price 02/24/2024, 6:49 PM

## 2024-02-24 NOTE — Plan of Care (Signed)

## 2024-02-24 NOTE — Plan of Care (Signed)
  Problem: Pain Managment: Goal: General experience of comfort will improve and/or be controlled Outcome: Progressing

## 2024-02-25 ENCOUNTER — Inpatient Hospital Stay (HOSPITAL_COMMUNITY)

## 2024-02-25 LAB — CBC WITH DIFFERENTIAL/PLATELET
Abs Immature Granulocytes: 0.58 10*3/uL — ABNORMAL HIGH (ref 0.00–0.07)
Basophils Absolute: 0.1 10*3/uL (ref 0.0–0.1)
Basophils Relative: 0 %
Eosinophils Absolute: 0.2 10*3/uL (ref 0.0–0.5)
Eosinophils Relative: 1 %
HCT: 34.5 % — ABNORMAL LOW (ref 39.0–52.0)
Hemoglobin: 11.3 g/dL — ABNORMAL LOW (ref 13.0–17.0)
Immature Granulocytes: 3 %
Lymphocytes Relative: 12 %
Lymphs Abs: 2.2 10*3/uL (ref 0.7–4.0)
MCH: 31.3 pg (ref 26.0–34.0)
MCHC: 32.8 g/dL (ref 30.0–36.0)
MCV: 95.6 fL (ref 80.0–100.0)
Monocytes Absolute: 1.4 10*3/uL — ABNORMAL HIGH (ref 0.1–1.0)
Monocytes Relative: 8 %
Neutro Abs: 14.2 10*3/uL — ABNORMAL HIGH (ref 1.7–7.7)
Neutrophils Relative %: 76 %
Platelets: 248 10*3/uL (ref 150–400)
RBC: 3.61 MIL/uL — ABNORMAL LOW (ref 4.22–5.81)
RDW: 14.6 % (ref 11.5–15.5)
WBC: 18.7 10*3/uL — ABNORMAL HIGH (ref 4.0–10.5)
nRBC: 0.2 % (ref 0.0–0.2)

## 2024-02-25 LAB — BASIC METABOLIC PANEL WITH GFR
Anion gap: 7 (ref 5–15)
BUN: 15 mg/dL (ref 8–23)
CO2: 28 mmol/L (ref 22–32)
Calcium: 8.1 mg/dL — ABNORMAL LOW (ref 8.9–10.3)
Chloride: 103 mmol/L (ref 98–111)
Creatinine, Ser: 0.88 mg/dL (ref 0.61–1.24)
GFR, Estimated: >60 mL/min (ref >60)
Glucose, Bld: 121 mg/dL — ABNORMAL HIGH (ref 70–99)
Potassium: 4 mmol/L (ref 3.5–5.1)
Sodium: 138 mmol/L (ref 135–145)

## 2024-02-25 LAB — GLUCOSE, CAPILLARY
Glucose-Capillary: 125 mg/dL — ABNORMAL HIGH (ref 70–99)
Glucose-Capillary: 125 mg/dL — ABNORMAL HIGH (ref 70–99)
Glucose-Capillary: 157 mg/dL — ABNORMAL HIGH (ref 70–99)
Glucose-Capillary: 166 mg/dL — ABNORMAL HIGH (ref 70–99)

## 2024-02-25 LAB — PROCALCITONIN: Procalcitonin: <0.1 ng/mL

## 2024-02-25 NOTE — Plan of Care (Signed)
  Problem: Clinical Measurements: Goal: Ability to maintain clinical measurements within normal limits will improve Outcome: Progressing Goal: Will remain free from infection Outcome: Progressing Goal: Diagnostic test results will improve Outcome: Progressing Goal: Respiratory complications will improve Outcome: Progressing Goal: Cardiovascular complication will be avoided Outcome: Progressing   Problem: Health Behavior/Discharge Planning: Goal: Ability to manage health-related needs will improve Outcome: Progressing   Problem: Activity: Goal: Risk for activity intolerance will decrease Outcome: Progressing   Problem: Nutrition: Goal: Adequate nutrition will be maintained Outcome: Progressing   Problem: Pain Managment: Goal: General experience of comfort will improve and/or be controlled Outcome: Progressing   Problem: Elimination: Goal: Will not experience complications related to bowel motility Outcome: Progressing Goal: Will not experience complications related to urinary retention Outcome: Progressing   Problem: Safety: Goal: Ability to remain free from injury will improve Outcome: Progressing   Problem: Skin Integrity: Goal: Risk for impaired skin integrity will decrease Outcome: Progressing

## 2024-02-25 NOTE — Plan of Care (Signed)
   Problem: Activity: Goal: Risk for activity intolerance will decrease Outcome: Progressing

## 2024-02-25 NOTE — Progress Notes (Signed)
 [facility]                                                                               Douglas Price, is a 73 y.o. male, DOB - 1950-10-11, ZOX:[id number] Admit date - 02/14/2024    Outpatient Primary MD for the patient is [name], [name], MD  LOS - 11  days    Brief summary   Douglas Price is a 73 yo male with PMH depression, Parkinson's disease (not on treatment), insomnia, DMII, GERD, RLS who was brought to the hospital after being found on the floor at home soiled in urine. He had gone to work on Wednesday which was the last time he was reported to be in his normal state.  He typically ambulates with a walker at baseline and works at a school (chaperones students to various classrooms). A well check was performed on Friday after he didn't show up to school Thursday and Friday.  His sister thinks he may have been sitting on the edge of bed and leaned forward losing his balance and falling onto the ground.  Unknown whether he passed out before the fall or hit his face causing him to pass out afterwards. He underwent workup on admission and was found to be febrile with significant rhabdomyolysis and AKI which are progressively improving.    Medically stable for dc once placement confirmed.   Assessment & Plan    Assessment and Plan: * SIRS (systemic inflammatory response syndrome) (HCC)/ Aspiration pneumonia: - Presented with fever, tachycardia,, leukocytosis.  May be all due to rhabdomyolysis.  No significant source of infection at this time.  -Elevated lactic acid also considered in setting of volume depletion from rhabdo and not eating/drinking for probably 2 days - completed the course of unasyn  and doxycycline .    AKI (acute kidney injury) (HCC) - baseline creatinine ~ 0.9 - patient presents with increase in creat >0.3 mg/dL above baseline or creat increase >1.5x baseline presumed to have occurred within past 7 days PTA - creat 1.56 on admission,  back to  baseline today.   Rhabdomyolysis - CK 19,111 on admission - presumed down on floor ~48 hours -  on IV fluids.  - CK levels normalized.   Acute urinary retention - Developed urinary retention worse on 02/17/2024 and Foley catheter placed; required coud cath due to difficulty with placement initially so would not remove until regains much more strength and cognition  Dysphagia Currently on dysphagia 3 diet.   Hypernatremia From free water  deficit, dehydration, and rhabdomyolysis.  Resolved .  Stop the IV fluids as he appears edematous.  Sodium levels have normalized.    Parkinson's disease (HCC) Recommend outpatient follow up with Dr [name].  He was started on Sinemet  and he is able to tolerate very well.   Conjunctivitis Appears to have resolved .  On Erythromycin  ointment for 5 days.    Gait disturbance - Multifactorial from underlying Parkinson's and deconditioning  Facial contusion - Continue supportive care -No acute fractures on trauma workup.  Soft tissue contusion over right orbit and forehead  Fall at home, initial encounter - Severely deconditioned - PT/OT ordered now that he is clinically improving -  SNF on discharge.   Transaminitis Presumed to be from rhabdomyolysis.  Recheck liver panel today.   Multiple wounds of skin - No obvious signs of cellulitis - Continue wound care as per WOC nurse  Diabetic polyneuropathy associated with type 2 diabetes mellitus (HCC) - On Lyrica  at home  Depression Resume Wellbutrin , cymbalta  120 mg daily.  Continue with lithium  150  mg daily.   Insomnia Continue Restoril  and Remeron  and prn melatonin.   BPH (benign prostatic hyperplasia) - Continue Flomax  - Monitor for any signs of urinary retention  GERD (gastroesophageal reflux disease) - Continue Protonix   Hypokalemia Replaced.    Type 2 DM well controlled CBG'S   CBG (last 3)  Recent Labs    02/24/24 2110 02/25/24 0622 02/25/24 1215  GLUCAP 202*  125* 125*   Resume SSI.  A1c is 5.6% Currently on 10 units of Semglee  and SSI.    Hypertension Better controlled.    Leukocytosis Suspect reactive. Procalcitonin negative.  Follow up the repeat CXR.   RN Pressure Injury Documentation:    Malnutrition Type:  Nutrition Problem: Inadequate oral intake Etiology: dysphagia, inability to eat   Malnutrition Characteristics:  Signs/Symptoms: NPO status   Nutrition Interventions:  Interventions: Tube feeding, MVI  Estimated body mass index is 24.69 kg/m as calculated from the following:   Height as of this encounter: 6\' 3"  (1.905 m).   Weight as of this encounter: 89.6 kg.  Code Status: full code.  DVT Prophylaxis:  heparin  injection 5,000 Units Start: 02/14/24 1515   Level of Care: Level of care: Progressive Family Communication: Updated patient's family at bedside.   Disposition Plan:     Remains inpatient appropriate:  pending SNF.   Procedures:  None.   Consultants:   Neurology.   Antimicrobials:   Anti-infectives (From admission, onward)    Start     Dose/Rate Route Frequency Ordered Stop   02/19/24 0915  doxycycline  (VIBRA -TABS) tablet 100 mg        100 mg Per Tube Every 12 hours 02/19/24 0904 02/23/24 1946   02/16/24 1300  Ampicillin -Sulbactam (UNASYN ) 3 g in sodium chloride  0.9 % 100 mL IVPB  Status:  Discontinued        3 g 200 mL/hr over 30 Minutes Intravenous Every 6 hours 02/16/24 1252 02/21/24 0937   02/15/24 1100  cefTRIAXone  (ROCEPHIN ) 2 g in sodium chloride  0.9 % 100 mL IVPB  Status:  Discontinued        2 g 200 mL/hr over 30 Minutes Intravenous Every 24 hours 02/14/24 1521 02/15/24 1225   02/14/24 1145  cefTRIAXone  (ROCEPHIN ) 2 g in sodium chloride  0.9 % 100 mL IVPB        2 g 200 mL/hr over 30 Minutes Intravenous Once 02/14/24 1142 02/14/24 1247        Medications  Scheduled Meds:  buPROPion   150 mg Oral BID   carbidopa -levodopa   1 tablet Oral TID   Chlorhexidine  Gluconate Cloth   6 each Topical Daily   DULoxetine   120 mg Oral Daily   dutasteride   0.5 mg Oral Daily   erythromycin    Both Eyes BID   Gerhardt's butt cream   Topical BID   heparin   5,000 Units Subcutaneous Q8H   insulin  aspart  0-15 Units Subcutaneous TID WC   insulin  glargine-yfgn  10 Units Subcutaneous Daily   irbesartan   75 mg Oral Daily   ketotifen  1 drop Both Eyes BID   lidocaine   1 Application Urethral Once   lithium   carbonate  150 mg Per Tube QHS   melatonin  3 mg Oral QHS   mirtazapine   15 mg Oral QHS   multivitamin with minerals  1 tablet Oral Daily   mupirocin  ointment   Topical BID   pantoprazole   40 mg Oral QHS   pregabalin   150 mg Per Tube BID   sodium chloride  flush  3 mL Intravenous Q12H   Continuous Infusions: PRN Meds:.acetaminophen  (TYLENOL ) oral liquid 160 mg/5 mL, acetaminophen  **OR** [DISCONTINUED] acetaminophen , albuterol , hydrALAZINE , oxyCODONE , temazepam     Subjective:   Douglas Price was seen and examined today.  No new complaints  Objective:   Vitals:   02/24/24 2327 02/25/24 0339 02/25/24 0754 02/25/24 1217  BP: (!) 149/60 124/65 (!) 150/75 136/67  Pulse: 69 (!) 58 72 77  Resp: 16 16  16   Temp: 98.1 F (36.7 C) 97.7 F (36.5 C) 97.6 F (36.4 C) (!) 97.3 F (36.3 C)  TempSrc: Oral Oral Axillary Axillary  SpO2: 95% 97%  95%  Weight:      Height:        Intake/Output Summary (Last 24 hours) at 02/25/2024 1457 Last data filed at 02/25/2024 0506 Gross per 24 hour  Intake 3 ml  Output 1600 ml  Net -1597 ml   Filed Weights   02/16/24 2100  Weight: 89.6 kg     Exam General exam: Appears calm and comfortable  Respiratory system: Clear to auscultation. Respiratory effort normal. Cardiovascular system: S1 & S2 heard, RRR. No JVD,  Gastrointestinal system: Abdomen is nondistended, soft and nontender.  Central nervous system: Alert and oriented. Extremities: warm extremities. Upper extremity edema improving.  Skin: No rashes, Psychiatry: Mood & affect  appropriate.       Data Reviewed:  I have personally reviewed following labs and imaging studies   CBC Lab Results  Component Value Date   WBC 18.7 (H) 02/25/2024   RBC 3.61 (L) 02/25/2024   HGB 11.3 (L) 02/25/2024   HCT 34.5 (L) 02/25/2024   MCV 95.6 02/25/2024   MCH 31.3 02/25/2024   PLT 248 02/25/2024   MCHC 32.8 02/25/2024   RDW 14.6 02/25/2024   LYMPHSABS 2.2 02/25/2024   MONOABS 1.4 (H) 02/25/2024   EOSABS 0.2 02/25/2024   BASOSABS 0.1 02/25/2024     Last metabolic panel Lab Results  Component Value Date   NA 138 02/25/2024   K 4.0 02/25/2024   CL 103 02/25/2024   CO2 28 02/25/2024   BUN 15 02/25/2024   CREATININE 0.88 02/25/2024   GLUCOSE 121 (H) 02/25/2024   GFRNONAA >60 02/25/2024   GFRAA >60 03/07/2016   CALCIUM 8.1 (L) 02/25/2024   PROT 4.6 (L) 02/23/2024   ALBUMIN  2.0 (L) 02/23/2024   BILITOT 0.6 02/23/2024   ALKPHOS 44 02/23/2024   AST 61 (H) 02/23/2024   ALT 45 (H) 02/23/2024   ANIONGAP 7 02/25/2024    CBG (last 3)  Recent Labs    02/24/24 2110 02/25/24 0622 02/25/24 1215  GLUCAP 202* 125* 125*      Coagulation Profile: No results for input(s): "INR", "PROTIME" in the last 168 hours.   Radiology Studies: No results found.     Feliciana Horn M.D. [facility] 02/25/2024, 2:57 PM  Available via Epic secure chat 7am-7pm After 7 pm, please refer to night coverage provider listed on amion.

## 2024-02-25 NOTE — TOC Progression Note (Signed)
 Transition of Care [facility]) - Progression Note    Patient Details  Name: Douglas Price MRN: [id number] Date of Birth: [birth year]  [facility] of [facility] CM/SW Contact  Tandy Fam, [place] Phone Number: 02/25/2024, 4:24 PM  Clinical Narrative:   Patient's insurance auth remains pending at this time. CSW to follow.  UPDATE: CSW received a call from [employer] case management representative to discuss update to disposition and any assistance that can be provided. CSW asked [employer] representative to assist with expediting approval for SNF. [employer] representative was not aware of SNF request, but would look into it. CSW met with patient and brother at bedside to provide update that insurance was still pending at this time. CSW to follow.    Expected Discharge Plan: Skilled Nursing Facility Barriers to Discharge: Continued Medical Work up, English as a second language teacher  Expected Discharge Plan and Services   Discharge Planning Services: CM Consult Post Acute Care Choice: IP Rehab Living arrangements for the past 2 months: Apartment                                       Social Determinants of Health (SDOH) Interventions SDOH Screenings   Food Insecurity: No Food Insecurity (02/16/2024)  Housing: Low Risk  (02/16/2024)  Transportation Needs: No Transportation Needs (02/16/2024)  Utilities: Not At Risk (02/16/2024)  Depression (PHQ2-9): Medium Risk (02/11/2023)  Financial Resource Strain: Low Risk  (02/07/2023)  Social Connections: Moderately Isolated (02/16/2024)  Tobacco Use: High Risk (02/20/2024)    Readmission Risk Interventions     No data to display

## 2024-02-26 DIAGNOSIS — N179 Acute kidney failure, unspecified: Secondary | ICD-10-CM | POA: Diagnosis not present

## 2024-02-26 DIAGNOSIS — R338 Other retention of urine: Secondary | ICD-10-CM | POA: Diagnosis not present

## 2024-02-26 DIAGNOSIS — A419 Sepsis, unspecified organism: Secondary | ICD-10-CM | POA: Diagnosis not present

## 2024-02-26 DIAGNOSIS — M6282 Rhabdomyolysis: Secondary | ICD-10-CM | POA: Diagnosis not present

## 2024-02-26 LAB — GLUCOSE, CAPILLARY
Glucose-Capillary: 119 mg/dL — ABNORMAL HIGH (ref 70–99)
Glucose-Capillary: 127 mg/dL — ABNORMAL HIGH (ref 70–99)
Glucose-Capillary: 123 mg/dL — ABNORMAL HIGH (ref 70–99)
Glucose-Capillary: 242 mg/dL — ABNORMAL HIGH (ref 70–99)

## 2024-02-26 NOTE — Progress Notes (Signed)
 Progress Note    Douglas Price   ZOX:[id number]  DOB: 1950/10/18  DOA: 02/14/2024     12 PCP: Lonzie Robins, MD  Initial CC: Found down at home  Hospital Course: Douglas Price is a 73 yo male with PMH depression, Parkinson's disease (not on treatment), insomnia, DMII, GERD, RLS who was brought to the hospital after being found on the floor at home soiled in urine. He had gone to work on Wednesday which was the last time he was reported to be in his normal state.  He typically ambulates with a walker at baseline and works at a school (chaperones students to various classrooms). A well check was performed on Friday after he didn't show up to school Thursday and Friday.  His sister thinks he may have been sitting on the edge of bed and leaned forward losing his balance and falling onto the ground.  Unknown whether he passed out before the fall or hit his face causing him to pass out afterwards. He underwent workup on admission and was found to be febrile with significant rhabdomyolysis and AKI. He was admitted for further workup.  Interval History:  Tremendous improvement since patient seen last week.  He is now completely alert, awake, and talking very easily.  Has started working with therapy some.  Also tolerating a diet and NG tube has since been removed. He is very motivated to working with therapy and going to rehab.  Brother present bedside this morning and updated as well.  Assessment and Plan: * Severe sepsis (HCC)-resolved as of 02/26/2024 - Presented with fever, tachycardia,, leukocytosis.  Initially presumed to be due to rhabdomyolysis but with persistent concern for infection, was felt to have probable aspiration and treated from a pulmonary standpoint with antibiotic course -Elevated lactic acid also considered multifactorial in setting of volume depletion from rhabdo and not eating/drinking for probably 2 days as well as presumed infection after workup completed -Urinalysis  negative for signs of infection.  CXR unremarkable.   - Given persistent fever and leukocytosis now with elevated procalcitonin, will go ahead and start on empiric Unasyn  and treat for suspected aspiration despite no CXR findings.  Do not appreciate any significant cellulitis on his legs either.  Biggest risk would be aspiration given how he presented and his inability to take in p.o. safely on admission - PCT normalized and course completed with unasyn  and doxy - Fever has resolved as well as mentation  AKI (acute kidney injury) (HCC)-resolved as of 02/26/2024 - baseline creatinine ~ 0.9 - patient presents with increase in creat >0.3 mg/dL above baseline or creat increase >1.5x baseline presumed to have occurred within past 7 days PTA - creat 1.56 on admission - Most certainly from volume depletion due to being on the floor prior to admission along with some probable contribution from rhabdo with severely elevated CK - Renal function normalized after fluid resuscitation   Rhabdomyolysis-resolved as of 02/26/2024 - CK 19,111 on admission - presumed down on floor ~48 hours - CK normalized with aggressive fluid repletion  Acute urinary retention - Developed urinary retention worse on 02/17/2024 and Foley catheter placed; required coud cath due to difficulty with placement initially so would not remove until regains much more strength and cognition  Dysphagia-resolved as of 02/26/2024 - Evaluated by SLP.  Still not safe for p.o. intake - NG tube placed 5/25 - Patient had steady improvement and NG tube able to be removed and he has been progressed to dysphagia 3 diet  Parkinson's disease (HCC) - Follows with Dr. Winferd Hatter -Has not been on treatment due to no benefit from levodopa  and not a DBS candidate due to underlying severe depression - tremoring worse in setting of acute illness; discussed with neurology briefly; will trial sinemet  again and some low dose ativan  to see if can give him some relief   - Seems to be stable on Sinemet ; Ativan  has been discontinued - will defer to neurology at follow up regarding continuation of sinemet  or not  Hypernatremia-resolved as of 02/26/2024 - has been on IVF for rhabdo - Tube feeds with free water  started 02/17/2024; sodium was still trending up and appears free water  flushes may have not been administered appropriately - Na levels responded well to tube feeds and free water  flushes and has since normalized with clinical improvement  Polypharmacy - On multiple sedating medications for treatment of his depression and Parkinson's along with insomnia; concerned that this contributed to his fall -Medications include Wellbutrin , Klonopin , Cymbalta , Norco, Remeron , Lyrica , Restoril   Gait disturbance - Multifactorial from underlying Parkinson's and deconditioning  Facial contusion - Continue supportive care -No acute fractures on trauma workup.  Soft tissue contusion over right orbit and forehead  Fall at home, initial encounter - Severely deconditioned - PT/OT following - He would ideally benefit from CIR if approved  Transaminitis - Presumed from rhabdo - Continue trending  Multiple wounds of skin - No obvious signs of cellulitis - Continue wound care as per WOC nurse  Conjunctivitis-resolved as of 02/26/2024 - Mild purulence noted in right eye with some crusting -Trial of erythromycin  for both eyes to avoid spread - Has since resolved and completed course  Diabetic polyneuropathy associated with type 2 diabetes mellitus (HCC) - On Lyrica  at home  Depression - Follows with psychiatry outpatient, notes reviewed - Lithium  level low (0.11) and he endorses compliance -Continue lithium , Remeron , Restoril , Cymbalta , Wellbutrin   Insomnia Continue Restoril  and Remeron   BPH (benign prostatic hyperplasia) - Continue Flomax   GERD (gastroesophageal reflux disease) - Continue Protonix   Hypokalemia - Replete as needed   Old records  reviewed in assessment of this patient  Antimicrobials: Unasyn  02/16/2024 >> 02/21/2024 Doxycycline  02/19/2024 >> 02/23/2024  DVT prophylaxis:  heparin  injection 5,000 Units Start: 02/14/24 1515   Code Status:   Code Status: Full Code  Mobility Assessment (Last 72 Hours)     Mobility Assessment     Row Name 02/26/24 1121 02/26/24 0537 02/26/24 0408 02/26/24 0030 02/25/24 2150   Does patient have an order for bedrest or is patient medically unstable -- No - Continue assessment No - Continue assessment No - Continue assessment No - Continue assessment   What is the highest level of mobility based on the progressive mobility assessment? Level 1 (Bedfast) - Unable to balance while sitting on edge of bed Level 1 (Bedfast) - Unable to balance while sitting on edge of bed Level 1 (Bedfast) - Unable to balance while sitting on edge of bed Level 1 (Bedfast) - Unable to balance while sitting on edge of bed Level 1 (Bedfast) - Unable to balance while sitting on edge of bed   Is the above level different from baseline mobility prior to current illness? -- Yes - Recommend PT order Yes - Recommend PT order Yes - Recommend PT order Yes - Recommend PT order    Row Name 02/25/24 2000 02/25/24 1930 02/25/24 1200 02/25/24 0900 02/25/24 0548   Does patient have an order for bedrest or is patient medically unstable No - Continue  assessment No - Continue assessment No - Continue assessment No - Continue assessment No - Continue assessment   What is the highest level of mobility based on the progressive mobility assessment? Level 1 (Bedfast) - Unable to balance while sitting on edge of bed Level 1 (Bedfast) - Unable to balance while sitting on edge of bed Level 1 (Bedfast) - Unable to balance while sitting on edge of bed Level 1 (Bedfast) - Unable to balance while sitting on edge of bed Level 1 (Bedfast) - Unable to balance while sitting on edge of bed   Is the above level different from baseline mobility prior to  current illness? Yes - Recommend PT order Yes - Recommend PT order -- -- Yes - Recommend PT order    Row Name 02/25/24 0409 02/25/24 0207 02/24/24 2353 02/24/24 2137 02/24/24 2000   Does patient have an order for bedrest or is patient medically unstable No - Continue assessment No - Continue assessment No - Continue assessment No - Continue assessment No - Continue assessment   What is the highest level of mobility based on the progressive mobility assessment? Level 1 (Bedfast) - Unable to balance while sitting on edge of bed Level 1 (Bedfast) - Unable to balance while sitting on edge of bed Level 1 (Bedfast) - Unable to balance while sitting on edge of bed Level 1 (Bedfast) - Unable to balance while sitting on edge of bed Level 1 (Bedfast) - Unable to balance while sitting on edge of bed   Is the above level different from baseline mobility prior to current illness? Yes - Recommend PT order Yes - Recommend PT order Yes - Recommend PT order Yes - Recommend PT order Yes - Recommend PT order    Row Name 02/24/24 1200 02/24/24 0900 02/23/24 2000 02/23/24 1700     Does patient have an order for bedrest or is patient medically unstable No - Continue assessment No - Continue assessment No - Continue assessment No - Continue assessment    What is the highest level of mobility based on the progressive mobility assessment? Level 1 (Bedfast) - Unable to balance while sitting on edge of bed Level 1 (Bedfast) - Unable to balance while sitting on edge of bed Level 1 (Bedfast) - Unable to balance while sitting on edge of bed Level 1 (Bedfast) - Unable to balance while sitting on edge of bed    Is the above level different from baseline mobility prior to current illness? -- -- Yes - Recommend PT order Yes - Recommend PT order             Barriers to discharge: None Disposition Plan: Possibly rehab HH orders placed:  Status is: Inpatient  Objective: Blood pressure 130/74, pulse 75, temperature 98.7 F (37.1  C), temperature source Oral, resp. rate 18, height 6\' 3"  (1.905 m), weight 89.6 kg, SpO2 100%.  Examination:  Physical Exam Constitutional:      General: He is not in acute distress.    Appearance: Normal appearance. He is not ill-appearing.  HENT:     Head:     Comments: Bruising noted along right forehead and around right eye with mild purulent drainage from right eye    Mouth/Throat:     Mouth: Mucous membranes are moist.  Eyes:     General:        Right eye: No discharge.     Extraocular Movements: Extraocular movements intact.  Cardiovascular:     Rate and Rhythm: Normal rate  and regular rhythm.  Pulmonary:     Effort: Pulmonary effort is normal. No respiratory distress.     Breath sounds: Normal breath sounds. No wheezing.  Abdominal:     General: Bowel sounds are normal. There is no distension.     Palpations: Abdomen is soft.     Tenderness: There is no abdominal tenderness.  Musculoskeletal:     Cervical back: Normal range of motion and neck supple.  Skin:    General: Skin is warm and dry.     Comments: Multiple lower extremity skin wounds but no obvious signs of cellulitis  Neurological:     General: No focal deficit present.     Mental Status: He is alert and oriented to person, place, and time.     Comments: Minimal tremor noted in right upper extremity  Psychiatric:        Mood and Affect: Mood normal.        Behavior: Behavior normal.      Consultants:    Procedures:    Data Reviewed: Results for orders placed or performed during the hospital encounter of 02/14/24 (from the past 24 hours)  Glucose, capillary     Status: Abnormal   Collection Time: 02/25/24  4:27 PM  Result Value Ref Range   Glucose-Capillary 157 (H) 70 - 99 mg/dL   Comment 1 Notify RN    Comment 2 Document in Chart   Glucose, capillary     Status: Abnormal   Collection Time: 02/25/24  9:42 PM  Result Value Ref Range   Glucose-Capillary 166 (H) 70 - 99 mg/dL  Glucose, capillary      Status: Abnormal   Collection Time: 02/26/24  6:27 AM  Result Value Ref Range   Glucose-Capillary 119 (H) 70 - 99 mg/dL  Glucose, capillary     Status: Abnormal   Collection Time: 02/26/24 11:45 AM  Result Value Ref Range   Glucose-Capillary 127 (H) 70 - 99 mg/dL   Comment 1 Notify RN    Comment 2 Document in Chart     I have reviewed pertinent nursing notes, vitals, labs, and images as necessary. I have ordered labwork to follow up on as indicated.  I have reviewed the last notes from staff over past 24 hours. I have discussed patient's care plan and test results with nursing staff, CM/SW, and other staff as appropriate.  Time spent: Greater than 50% of the 55 minute visit was spent in counseling/coordination of care for the patient as laid out in the A&P.   LOS: 12 days   Faith Homes, MD [facility] 02/26/2024, 3:37 PM

## 2024-02-26 NOTE — Progress Notes (Signed)

## 2024-02-26 NOTE — Progress Notes (Signed)
 Occupational Therapy Treatment Patient Details Name: Douglas Price MRN: [id number] DOB: [birth year] Today's Date: 02/26/2024   History of present illness Patient is a 73 yo male presenting to the ED after being found down for an unknown period of time on 02/14/24.  NGT and rectal tube discontinued on 5/30. Admitted to with SIRS, AKI, rhabdomyolysis. PMH includes: DM, restless leg, Parkinson's, spinal stenosis, diverticulosis   OT comments  Pt made excellent progress today with bed level ADLs and increased functional use of RUE as evidenced by an increased score on 6-Clicks AM-PAC measure of occupational Performance with previous score of 10/24, and current score of 12/24. Pt tolerated 53 min of OT today and while this was bed level it was fast paced and intensive and pt showing excellent engagement and cognitively able to participate with all activities.  Because of pt's progress, this OT is changing recommendations for discharge to [facility].  Patient will benefit from intensive inpatient follow-up therapy, >3 hours/day.  Pt and brother both agreeable to try for this.         If plan is discharge home, recommend the following:  Two people to help with walking and/or transfers;Two people to help with bathing/dressing/bathroom;Assistance with cooking/housework;Direct supervision/assist for medications management;Direct supervision/assist for financial management;Assist for transportation;Help with stairs or ramp for entrance;Supervision due to cognitive status;Assistance with feeding   Equipment Recommendations   (defer)    Recommendations for Other Services      Precautions / Restrictions Precautions Precautions: Fall Recall of Precautions/Restrictions: Intact Precaution/Restrictions Comments: Cogniton significantly improved. Following all instructions. Demonstrating understanding of HEP. Progressively using RT UE for feeding/grooming with use of universal cuff. Restrictions Weight Bearing  Restrictions Per Provider Order: No       Mobility Bed Mobility                    Transfers                         Balance                                           ADL either performed or assessed with clinical judgement   ADL Overall ADL's : Needs assistance/impaired Eating/Feeding: Bed level Eating/Feeding Details (indicate cue type and reason): Pt fitted with universal cuff, foream based to RT UE. Pt able to simulate self feeding once RT elbow elevated to near shoulder level. Edema to arm still hindering elbow flexion. Grooming: Bed level Grooming Details (indicate cue type and reason): HOB max elevated. Pt assisted with toothbrush in cuff. Total setup of toothbrush and then pt used RUE to brush teeth with elbow highly elevated. Pt shown how to use dicem on toothbrush for grip wot remove brush freom cuff. Pt able and then able to remove cuff from RUE himself.                       Toileting - Clothing Manipulation Details (indicate cue type and reason): Catheter. Flexiseal discontinued.            Extremity/Trunk Assessment Upper Extremity Assessment Upper Extremity Assessment: Generalized weakness;Right hand dominant;RUE deficits/detail;LUE deficits/detail RUE Deficits / Details: RUE remains edematous. Elevated higher to bring elbow close to shoulder and ed on proper elevation and use of RUE ad lib to avoid dependent edema. Intact grip of ~3-/5  but unable to fully extend fingers. RUE Sensation:  (Pt reports RT and LT hands feel alike and denied numbness,) LUE Deficits / Details: No longer edematous, but very weak with shoulder ~3/5, elbow to grip 3+/5            Vision Baseline Vision/History: 1 Wears glasses Ability to See in Adequate Light: 0 Adequate Patient Visual Report: No change from baseline Additional Comments: Pt reports ongoing intermittent diplopia, but able to reach for items as needed with LUE.    Perception     Praxis     Communication Communication Communication: No apparent difficulties   Cognition Arousal: Alert Behavior During Therapy: WFL for tasks assessed/performed               OT - Cognition Comments: Pt showing no deficits today, but not assessed in depth. Pt does admit to memory loss from time on floor at home, but recalls PT 2 days ago with atetmpts to stand.                 Following commands: Intact Following commands impaired: Follows multi-step commands with increased time, Only follows one step commands consistently      Cueing   Cueing Techniques: Verbal cues, Visual cues  Exercises Other Exercises Other Exercises: Performed hold relax PNF to RT hand x 3 reps to promote digit extension with good results. Applied palm protector modified with washcloth to widen to prevent further finger flexion (PIP/DIP). Other Exercises: Pt performed LUE Yellow T-Band concentric shoulder straight arm raises and military press x 4 reps each. Then perform LUE eccentric shoulder strenghtning with Green T-Band x 4 reps. PT instructed to rest when tired and to perform as many reps as feels good 3 x/day.  Brother present to reinforce. Other Exercises: BLE AROM exercises with quad sets x 3 reps. Pt able to show RLE straight leg raise but barely cleared bed on LLE and reported surprise by this. Pt performed B knee flexion in bed WFL. Pt encouaged to work LEs ad lib.    Shoulder Instructions       General Comments      Pertinent Vitals/ Pain       Pain Assessment Pain Assessment: No/denies pain  Home Living                                          Prior Functioning/Environment              Frequency  Min 3X/week        Progress Toward Goals  OT Goals(current goals can now be found in the care plan section)  Progress towards OT goals: Progressing toward goals  Acute Rehab OT Goals Patient Stated Goal: BE able to stand. Be able to  use dominant RT hand for feeding/grooming OT Goal Formulation: With patient/family Time For Goal Achievement: 03/04/24 Potential to Achieve Goals: Good  Plan      Co-evaluation                 AM-PAC OT "6 Clicks" Daily Activity     Outcome Measure   Help from another person eating meals?: A Little Help from another person taking care of personal grooming?: A Little Help from another person toileting, which includes using toliet, bedpan, or urinal?: Total Help from another person bathing (including washing, rinsing, drying)?: A Lot Help from another person to  put on and taking off regular upper body clothing?: A Lot Help from another person to put on and taking off regular lower body clothing?: Total 6 Click Score: 12    End of Session    OT Visit Diagnosis: Unsteadiness on feet (R26.81);Other abnormalities of gait and mobility (R26.89);Repeated falls (R29.6);Muscle weakness (generalized) (M62.81);History of falling (Z91.81);Other symptoms and signs involving cognitive function   Activity Tolerance Patient tolerated treatment well   Patient Left in bed;with call bell/phone within reach;with family/visitor present   Nurse Communication Other (comment) (RN gave meds before OT began)        Time: 0957-1050 OT Time Calculation (min): 53 min  Charges: OT General Charges $OT Visit: 1 Visit OT Treatments $Self Care/Home Management : 23-37 mins $Neuromuscular Re-education: 8-22 mins $Therapeutic Exercise: 8-22 mins  [facility], OT Acute Rehab Services Office: [phone] 02/26/2024   Asher Blade 02/26/2024, 11:25 AM

## 2024-02-26 NOTE — Progress Notes (Signed)
 Physical Therapy Treatment Patient Details Name: Douglas Price MRN: [id number] DOB: [birth year] Today's Date: 02/26/2024   History of Present Illness Patient is a 73 yo male presenting to the ED after being found down for an unknown period of time on 02/14/24.  NGT and rectal tube discontinued on 5/30. Admitted to with SIRS, AKI, rhabdomyolysis. PMH includes: DM, restless leg, Parkinson's, spinal stenosis, diverticulosis    PT Comments  Pt is progressing slowly towards goals. Pt was Max A for bed mobility today and 2 person Mod A for sit to stand and transfer from EOB to recliner. Pt requires assist with initiation and extra time was spent today on educating pt on body mechanics and why he needs to lean forward while sitting EOB and during transfers in order to safely perform functional activities. Pt demonstrated and stated understanding. Due to pt current functional status, home set up and available assistance at home recommending skilled physical therapy services < 3 hours/day in order to address strength, balance and functional mobility to decrease risk for falls, injury, immobility, skin break down and re-hospitalization.      If plan is discharge home, recommend the following: Two people to help with walking and/or transfers;Assistance with cooking/housework;Assist for transportation;Help with stairs or ramp for entrance   Can travel by private vehicle     No  Equipment Recommendations  Wheelchair cushion (measurements PT);Hospital bed;Wheelchair (measurements PT);BSC/3in1       Precautions / Restrictions Precautions Precautions: Fall Recall of Precautions/Restrictions: Intact Precaution/Restrictions Comments: Cogniton improved. Restrictions Weight Bearing Restrictions Per Provider Order: No     Mobility  Bed Mobility Overal bed mobility: Needs Assistance Bed Mobility: Supine to Sit     Supine to sit: HOB elevated, Used rails, Max assist     General bed mobility comments:  scooting to EOB with A using pad under pt, HOB up and pt needing max A to lift trunk to mid line and stay mid ling initially due to heavy posterior lean. Extra time spent on education of the importance of body mechanics to prevent sliding off EOB    Transfers Overall transfer level: Needs assistance Equipment used: 2 person hand held assist Transfers: Bed to chair/wheelchair/BSC, Sit to/from Stand Sit to Stand: From elevated surface, Mod assist, +2 physical assistance   Step pivot transfers: Mod assist, +2 physical assistance, +2 safety/equipment       General transfer comment: Extra time with heavy cues for forward trunk to bring COM over BOS. 2 person underarm and gait belt lift to standing at Mod A +2 with good upright posture and pt able to take steps from EOB to recliner with facilitation at the gait belt to wgt shift and progress feet.    Ambulation/Gait             Pre-gait activities: Pt was able to take steps from EOB to recliner with 2 person MOd A and facilitation of Mod A at the gait belt or COM in order to transfer wgt to progress alternating feet anteriorly        Balance Overall balance assessment: Needs assistance Sitting-balance support: Bilateral upper extremity supported Sitting balance-Leahy Scale: Poor Sitting balance - Comments: poor to fair. Initially due to very heavy posterior lean pt was Poor requiring Mod A to maintan upright posture. After education on body mechanics and encouragement to lean foreward pt was CGA to close SBA sitting EOB. Postural control: Posterior lean Standing balance support: Bilateral upper extremity supported, During functional activity Standing balance-Leahy Scale:  Poor Standing balance comment: Pt was mod A to get to standing and Min A in standing for balance and taking steps. Reliant on external support      Communication Communication Communication: No apparent difficulties  Cognition Arousal: Alert Behavior During  Therapy: WFL for tasks assessed/performed   PT - Cognitive impairments: Awareness, Problem solving, Safety/Judgement, Sequencing, Initiation     PT - Cognition Comments: slow processing, slow initiation Following commands: Intact Following commands impaired: Follows multi-step commands inconsistently, Follows one step commands with increased time, Only follows one step commands consistently    Cueing Cueing Techniques: Verbal cues, Visual cues     General Comments General comments (skin integrity, edema, etc.): Pt HR 91 during activity      Pertinent Vitals/Pain Pain Assessment Pain Assessment: No/denies pain     PT Goals (current goals can now be found in the care plan section) Acute Rehab PT Goals Patient Stated Goal: to improve mobility PT Goal Formulation: With patient/family Time For Goal Achievement: 03/04/24 Potential to Achieve Goals: Good Progress towards PT goals: Progressing toward goals    Frequency    Min 2X/week      PT Plan  Continue with current POC        AM-PAC PT "6 Clicks" Mobility   Outcome Measure  Help needed turning from your back to your side while in a flat bed without using bedrails?: A Lot Help needed moving from lying on your back to sitting on the side of a flat bed without using bedrails?: A Lot Help needed moving to and from a bed to a chair (including a wheelchair)?: Total Help needed standing up from a chair using your arms (e.g., wheelchair or bedside chair)?: Total Help needed to walk in hospital room?: Total Help needed climbing 3-5 steps with a railing? : Total 6 Click Score: 8    End of Session Equipment Utilized During Treatment: Gait belt Activity Tolerance: Patient tolerated treatment well Patient left: in chair;with call bell/phone within reach;with chair alarm set Nurse Communication: Mobility status;Need for lift equipment (maximove pad placed under patient for nursing to return patient to bed.) PT Visit Diagnosis:  Unsteadiness on feet (R26.81);Other abnormalities of gait and mobility (R26.89);Muscle weakness (generalized) (M62.81);History of falling (Z91.81)     Time: 9604-5409 PT Time Calculation (min) (ACUTE ONLY): 23 min  Charges:    $Therapeutic Activity: 23-37 mins PT General Charges $$ ACUTE PT VISIT: 1 Visit                    Sloan Duncans, DPT, CLT  Acute Rehabilitation Services Office: [phone] (Secure chat preferred)    Jenice Mitts 02/26/2024, 4:01 PM

## 2024-02-26 NOTE — TOC Progression Note (Signed)
 Transition of Care [facility]) - Progression Note    Patient Details  Name: Douglas Price MRN: [id number] Date of Birth: 11-Jul-1951  Transition of Care [facility]) CM/SW Contact  Tandy Fam, [place] Phone Number: 02/26/2024, 10:39 AM  Clinical Narrative:   CSW spoke with Admissions at [facility] and authorization is still pending at this time. CSW to follow.    Expected Discharge Plan: Skilled Nursing Facility Barriers to Discharge: Continued Medical Work up, English as a second language teacher  Expected Discharge Plan and Services   Discharge Planning Services: CM Consult Post Acute Care Choice: IP Rehab Living arrangements for the past 2 months: Apartment                                       Social Determinants of Health (SDOH) Interventions SDOH Screenings   Food Insecurity: No Food Insecurity (02/16/2024)  Housing: Low Risk  (02/16/2024)  Transportation Needs: No Transportation Needs (02/16/2024)  Utilities: Not At Risk (02/16/2024)  Depression (PHQ2-9): Medium Risk (02/11/2023)  Financial Resource Strain: Low Risk  (02/07/2023)  Social Connections: Moderately Isolated (02/16/2024)  Tobacco Use: High Risk (02/20/2024)    Readmission Risk Interventions     No data to display

## 2024-02-26 NOTE — Plan of Care (Signed)
  Problem: Clinical Measurements: Goal: Ability to maintain clinical measurements within normal limits will improve Outcome: Progressing Goal: Will remain free from infection Outcome: Progressing Goal: Diagnostic test results will improve Outcome: Progressing Goal: Respiratory complications will improve Outcome: Progressing Goal: Cardiovascular complication will be avoided Outcome: Progressing   Problem: Health Behavior/Discharge Planning: Goal: Ability to manage health-related needs will improve Outcome: Progressing   Problem: Activity: Goal: Risk for activity intolerance will decrease Outcome: Progressing   Problem: Nutrition: Goal: Adequate nutrition will be maintained Outcome: Progressing   Problem: Coping: Goal: Level of anxiety will decrease Outcome: Progressing   Problem: Pain Managment: Goal: General experience of comfort will improve and/or be controlled Outcome: Progressing   Problem: Elimination: Goal: Will not experience complications related to bowel motility Outcome: Progressing Goal: Will not experience complications related to urinary retention Outcome: Progressing   Problem: Safety: Goal: Ability to remain free from injury will improve Outcome: Progressing   Problem: Skin Integrity: Goal: Risk for impaired skin integrity will decrease Outcome: Progressing   Problem: Education: Goal: Ability to describe self-care measures that may prevent or decrease complications (Diabetes Survival Skills Education) will improve Outcome: Progressing Goal: Individualized Educational Video(s) Outcome: Progressing

## 2024-02-27 DIAGNOSIS — G20C Parkinsonism, unspecified: Secondary | ICD-10-CM | POA: Diagnosis not present

## 2024-02-27 DIAGNOSIS — A419 Sepsis, unspecified organism: Secondary | ICD-10-CM | POA: Diagnosis not present

## 2024-02-27 DIAGNOSIS — H1031 Unspecified acute conjunctivitis, right eye: Secondary | ICD-10-CM

## 2024-02-27 DIAGNOSIS — G709 Myoneural disorder, unspecified: Secondary | ICD-10-CM

## 2024-02-27 DIAGNOSIS — R338 Other retention of urine: Secondary | ICD-10-CM

## 2024-02-27 DIAGNOSIS — E1142 Type 2 diabetes mellitus with diabetic polyneuropathy: Secondary | ICD-10-CM

## 2024-02-27 DIAGNOSIS — N179 Acute kidney failure, unspecified: Secondary | ICD-10-CM | POA: Diagnosis not present

## 2024-02-27 DIAGNOSIS — N401 Enlarged prostate with lower urinary tract symptoms: Secondary | ICD-10-CM

## 2024-02-27 DIAGNOSIS — M6282 Rhabdomyolysis: Secondary | ICD-10-CM | POA: Diagnosis not present

## 2024-02-27 LAB — GLUCOSE, CAPILLARY
Glucose-Capillary: 117 mg/dL — ABNORMAL HIGH (ref 70–99)
Glucose-Capillary: 198 mg/dL — ABNORMAL HIGH (ref 70–99)

## 2024-02-27 MED ORDER — CLONAZEPAM 0.5 MG PO TABS: 0.5000 mg | ORAL_TABLET | Freq: Two times a day (BID) | ORAL | 0 refills | Status: DC | PRN

## 2024-02-27 MED ORDER — PREGABALIN 75 MG PO CAPS: 75.0000 mg | ORAL_CAPSULE | Freq: Two times a day (BID) | ORAL | 0 refills | Status: AC

## 2024-02-27 MED ORDER — HYDROCODONE-ACETAMINOPHEN 5-325 MG PO TABS: ORAL_TABLET | ORAL | 0 refills | Status: DC

## 2024-02-27 MED ORDER — CARBIDOPA-LEVODOPA 25-100 MG PO TABS: 1.0000 | ORAL_TABLET | Freq: Three times a day (TID) | ORAL | Status: AC

## 2024-02-27 MED ORDER — TEMAZEPAM 15 MG PO CAPS: 15.0000 mg | ORAL_CAPSULE | Freq: Every evening | ORAL | 0 refills | Status: DC | PRN

## 2024-02-27 NOTE — Progress Notes (Signed)
 5mg  oxycodone  tablet wasted in waste bin.  Witnessed by Elicia Ground RN.

## 2024-02-27 NOTE — Progress Notes (Signed)
 Patient transported off unit via [facility].  Patient alert and oriented x 4, patient belongings given to family to transport to SNF, VS stable.

## 2024-02-27 NOTE — Discharge Summary (Signed)
 Physician Discharge Summary   JHALEN ELEY WUJ:[id number] DOB: [birth year] DOA: 02/14/2024  PCP: [name], [name], MD  Admit date: 02/14/2024 Discharge date: 02/27/2024   Admitted From: Home Disposition:  SNF Discharging physician: Faith Homes, MD Barriers to discharge: none  Recommendations at discharge: Follow up with Dr. Winferd Hatter after discharge from rehab; continue sinemet  until appointment A1c 5.6% but may need insulin  resumed if CBG elevates Trial of void needed; remove foley on 02/28/24 or after and monitor for ability to void   Discharge Condition: stable CODE STATUS: Full  Diet recommendation:  Diet Orders (From admission, onward)     Start     Ordered   02/27/24 0000  Diet general        02/27/24 1245   02/21/24 0930  Diet regular Room service appropriate? Yes with Assist; Fluid consistency: Thin  Diet effective now       Comments: Crush meds; assist with feeding  Question Answer Comment  Room service appropriate? Yes with Assist   Fluid consistency: Thin      02/21/24 0930            Hospital Course: [name] is a 73 yo male with PMH depression, Parkinson's disease (not on treatment), insomnia, DMII, GERD, RLS who was brought to the hospital after being found on the floor at home soiled in urine. He had gone to work on Wednesday which was the last time he was reported to be in his normal state.  He typically ambulates with a walker at baseline and works at a school (chaperones students to various classrooms). A well check was performed on Friday after he didn't show up to school Thursday and Friday.  His sister thinks he may have been sitting on the edge of bed and leaned forward losing his balance and falling onto the ground.  Unknown whether he passed out before the fall or hit his face causing him to pass out afterwards. He underwent workup on admission and was found to be febrile with significant rhabdomyolysis and AKI. He was admitted for further  workup.  Assessment and Plan: * Severe sepsis (HCC)-resolved as of 02/26/2024 - Presented with fever, tachycardia,, leukocytosis.  Initially presumed to be due to rhabdomyolysis but with persistent concern for infection, was felt to have probable aspiration and treated from a pulmonary standpoint with antibiotic course -Elevated lactic acid also considered multifactorial in setting of volume depletion from rhabdo and not eating/drinking for probably 2 days as well as presumed infection after workup completed -Urinalysis negative for signs of infection.  CXR unremarkable.   - Given persistent fever and leukocytosis now with elevated procalcitonin, will go ahead and start on empiric Unasyn  and treat for suspected aspiration despite no CXR findings.  Do not appreciate any significant cellulitis on his legs either.  Biggest risk would be aspiration given how he presented and his inability to take in p.o. safely on admission - PCT normalized and course completed with unasyn  and doxy - Fever has resolved as well as mentation  AKI (acute kidney injury) (HCC)-resolved as of 02/26/2024 - baseline creatinine ~ 0.9 - patient presents with increase in creat >0.3 mg/dL above baseline or creat increase >1.5x baseline presumed to have occurred within past 7 days PTA - creat 1.56 on admission - Most certainly from volume depletion due to being on the floor prior to admission along with some probable contribution from rhabdo with severely elevated CK - Renal function normalized after fluid resuscitation   Rhabdomyolysis-resolved as of 02/26/2024 -  CK 19,111 on admission - presumed down on floor ~48 hours - CK normalized with aggressive fluid repletion  Acute urinary retention - Developed urinary retention worse on 02/17/2024 and Foley catheter placed; required coud cath due to difficulty with placement initially so would not remove until regains much more strength and cognition - okay to trial removing foley on  02/28/24 or later  Dysphagia-resolved as of 02/26/2024 - Evaluated by SLP.  Still not safe for p.o. intake - NG tube placed 5/25 - Patient had steady improvement and NG tube able to be removed and he has been progressed to regular diet  Parkinson's disease (HCC) - Follows with Dr. Winferd Hatter -Has not been on treatment due to no benefit from levodopa  and not a DBS candidate due to underlying severe depression - tremoring worse in setting of acute illness; discussed with neurology briefly; will trial sinemet  again and some low dose ativan  to see if can give him some relief  - Seems to be stable on Sinemet ; Ativan  has been discontinued - will defer to neurology at follow up regarding continuation of sinemet  or not; to follow up with Dr. Winferd Hatter after discharge from rehab  Hypernatremia-resolved as of 02/26/2024 - has been on IVF for rhabdo - Tube feeds with free water  started 02/17/2024; sodium was still trending up and appears free water  flushes may have not been administered appropriately - Na levels responded well to tube feeds and free water  flushes and has since normalized with clinical improvement  Polypharmacy - On multiple sedating medications for treatment of his depression and Parkinson's along with insomnia; concerned that this contributed to his fall -Medications include Wellbutrin , Klonopin , Cymbalta , Norco, Remeron , Lyrica , Restoril   Gait disturbance - Multifactorial from underlying Parkinson's and deconditioning  Fall at home, initial encounter - Severely deconditioned - PT/OT following - He would ideally benefit from CIR if approved  Transaminitis - Presumed from rhabdo  Multiple wounds of skin - No obvious signs of cellulitis - Continue wound care as per WOC nurse  Conjunctivitis-resolved as of 02/26/2024 - Mild purulence noted in right eye with some crusting -Trial of erythromycin  for both eyes to avoid spread - Has since resolved and completed course  Facial contusion-resolved  as of 02/27/2024 - Continue supportive care -No acute fractures on trauma workup.  Soft tissue contusion over right orbit and forehead  Diabetic polyneuropathy associated with type 2 diabetes mellitus (HCC) - On Lyrica  at home  Depression - Follows with psychiatry outpatient, notes reviewed - Lithium  level low (0.11) and he endorses compliance -Continue lithium , Remeron , Restoril , Cymbalta , Wellbutrin   Insomnia Continue Restoril  and Remeron   BPH (benign prostatic hyperplasia) - Continue Flomax   GERD (gastroesophageal reflux disease) - Continue Protonix   Hypokalemia - Repleted    Principal Diagnosis: Severe sepsis [facility])  Discharge Diagnoses: Active Hospital Problems   Diagnosis Date Noted   Acute urinary retention 02/18/2024    Priority: 3.   Parkinson's disease (HCC) 11/25/2017    Priority: 4.   Polypharmacy 02/15/2024    Priority: 5.   Multiple wounds of skin 02/14/2024    Priority: 5.   Transaminitis 02/14/2024    Priority: 5.   Fall at home, initial encounter 02/14/2024    Priority: 5.   Gait disturbance 02/14/2024    Priority: 5.   Diabetic polyneuropathy associated with type 2 diabetes mellitus (HCC) 11/25/2017    Priority: 6.   Depression 02/14/2024    Priority: 8.   Insomnia 02/14/2024    Priority: 9.   BPH (benign prostatic  hyperplasia) 02/14/2024    Priority: 10.   GERD (gastroesophageal reflux disease) 02/14/2024    Priority: 11.   Hypokalemia 02/15/2024    Resolved Hospital Problems   Diagnosis Date Noted Date Resolved   Severe sepsis [facility]) 02/14/2024 02/26/2024    Priority: 1.   Rhabdomyolysis 02/14/2024 02/26/2024    Priority: 2.   AKI (acute kidney injury) (HCC) 02/14/2024 02/26/2024    Priority: 2.   Dysphagia 02/16/2024 02/26/2024    Priority: 3.   Hypernatremia 02/16/2024 02/26/2024    Priority: 4.   Conjunctivitis 02/15/2024 02/26/2024    Priority: 5.   Facial contusion 02/14/2024 02/27/2024    Priority: 5.     Discharge  Instructions     Diet general   Complete by: As directed    Discharge wound care:   Complete by: As directed    Cleanse wounds to B lower legs, feet, toes, B knees, shins, R shoulder, R ribcage with Vashe wound cleanser do not rinse and allow to air dry.  Cover areas with single layer Xeroform gauze daily and secure with silicone foam or dry gauze and Kerlix roll gauze depending on what works best for each location.  2.Cover DTPI L hip with Xeroform gauze daily and secure with silicone foam  Cleanse R hand and R ear wounds with Vashe, do not rinse and allow to air dry.  Apply Mupirocin  ointment to wound beds twice daily and leave open to air (ear).  If desired can cover hand wound with silicone foam after placing Mupirocin    Increase activity slowly   Complete by: As directed       Allergies as of 02/27/2024       Reactions   Avelox [moxifloxacin] Other (See Comments)   Angioedema Fatigue   Quinolones Other (See Comments)   Angioedema   Lexapro [escitalopram] Other (See Comments)   unknown        Medication List     TAKE these medications    buPROPion  300 MG 24 hr tablet Commonly known as: WELLBUTRIN  XL TAKE 1 TABLET(300 MG) BY MOUTH DAILY What changed: See the new instructions.   carbidopa -levodopa  25-100 MG tablet Commonly known as: SINEMET  IR Take 1 tablet by mouth 3 (three) times daily.   clonazePAM  0.5 MG tablet Commonly known as: KlonoPIN  Take 1 tablet (0.5 mg total) by mouth 2 (two) times daily as needed for anxiety.   DULoxetine  60 MG capsule Commonly known as: CYMBALTA  Take 120 mg by mouth daily.   dutasteride  0.5 MG capsule Commonly known as: AVODART  Take 0.5 mg by mouth daily.   HYDROcodone -acetaminophen  5-325 MG tablet Commonly known as: NORCO/VICODIN 1-2 tabs PO q6 hours prn pain What changed:  how much to take how to take this when to take this reasons to take this additional instructions   lithium  carbonate 150 MG capsule TAKE 1  CAPSULE(150 MG) BY MOUTH EVERY EVENING What changed: See the new instructions.   loratadine 10 MG tablet Commonly known as: CLARITIN Take 10 mg by mouth daily as needed for allergies, rhinitis or itching.   metFORMIN  500 MG tablet Commonly known as: GLUCOPHAGE  Take 1,000 mg by mouth 2 (two) times daily with a meal.   mirtazapine  15 MG tablet Commonly known as: REMERON  Take 15 mg by mouth at bedtime.   nebivolol  2.5 MG tablet Commonly known as: BYSTOLIC  Take 2.5 mg by mouth daily.   omeprazole 20 MG capsule Commonly known as: PRILOSEC Take 20 mg by mouth 2 (two) times daily before  a meal.   pregabalin  75 MG capsule Commonly known as: LYRICA  Take 1 capsule (75 mg total) by mouth 2 (two) times daily.   tamsulosin  0.4 MG Caps capsule Commonly known as: FLOMAX  Take 0.4 mg by mouth daily.   telmisartan  40 MG tablet Commonly known as: MICARDIS  Take 40 mg by mouth daily.   temazepam  15 MG capsule Commonly known as: RESTORIL  Take 1 capsule (15 mg total) by mouth at bedtime as needed for sleep.               Discharge Care Instructions  (From admission, onward)           Start     Ordered   02/27/24 0000  Discharge wound care:       Comments: Cleanse wounds to B lower legs, feet, toes, B knees, shins, R shoulder, R ribcage with Vashe wound cleanser do not rinse and allow to air dry.  Cover areas with single layer Xeroform gauze daily and secure with silicone foam or dry gauze and Kerlix roll gauze depending on what works best for each location.  2.Cover DTPI L hip with Xeroform gauze daily and secure with silicone foam  Cleanse R hand and R ear wounds with Vashe, do not rinse and allow to air dry.  Apply Mupirocin  ointment to wound beds twice daily and leave open to air (ear).  If desired can cover hand wound with silicone foam after placing Mupirocin    02/27/24 1245            Contact information for after-discharge care     Destination     [facility]  Preferred SNF .   Service: Skilled Nursing Contact information: [address] [place]  [address] [phone]                    Allergies  Allergen Reactions   Avelox [Moxifloxacin] Other (See Comments)    Angioedema Fatigue   Quinolones Other (See Comments)    Angioedema   Lexapro [Escitalopram] Other (See Comments)    unknown    Consultations:   Procedures:   Discharge Exam: BP (!) 100/59 (BP Location: Right Arm)   Pulse 75   Temp 97.6 F (36.4 C) (Oral)   Resp 15   Ht 6\' 3"  (1.905 m)   Wt 89.6 kg   SpO2 99%   BMI 24.69 kg/m  Physical Exam Constitutional:      General: He is not in acute distress.    Appearance: Normal appearance. He is not ill-appearing.  HENT:     Head:     Comments: Bruising noted along right forehead and around right eye with mild purulent drainage from right eye    Mouth/Throat:     Mouth: Mucous membranes are moist.  Eyes:     General:        Right eye: No discharge.     Extraocular Movements: Extraocular movements intact.  Cardiovascular:     Rate and Rhythm: Normal rate and regular rhythm.  Pulmonary:     Effort: Pulmonary effort is normal. No respiratory distress.     Breath sounds: Normal breath sounds. No wheezing.  Abdominal:     General: Bowel sounds are normal. There is no distension.     Palpations: Abdomen is soft.     Tenderness: There is no abdominal tenderness.  Musculoskeletal:     Cervical back: Normal range of motion and neck supple.  Skin:    General: Skin is warm  and dry.     Comments: Multiple lower extremity skin wounds but no obvious signs of cellulitis  Neurological:     General: No focal deficit present.     Mental Status: He is alert and oriented to person, place, and time.     Comments: Minimal tremor noted in right upper extremity  Psychiatric:        Mood and Affect: Mood normal.        Behavior: Behavior normal.      The results of significant diagnostics from  this hospitalization (including imaging, microbiology, ancillary and laboratory) are listed below for reference.   Microbiology: Recent Results (from the past 240 hours)  Culture, blood (Routine X 2) w Reflex to ID Panel     Status: None   Collection Time: 02/19/24  9:37 AM   Specimen: BLOOD RIGHT HAND  Result Value Ref Range Status   Specimen Description BLOOD RIGHT HAND  Final   Special Requests   Final    BOTTLES DRAWN AEROBIC AND ANAEROBIC Blood Culture results may not be optimal due to an inadequate volume of blood received in culture bottles   Culture   Final    NO GROWTH 5 DAYS Performed at [facility], [address], [place], [place] [address]    Report Status 02/24/2024 FINAL  Final  Culture, blood (Routine X 2) w Reflex to ID Panel     Status: None   Collection Time: 02/19/24 10:19 AM   Specimen: BLOOD RIGHT ARM  Result Value Ref Range Status   Specimen Description BLOOD RIGHT ARM  Final   Special Requests   Final    BOTTLES DRAWN AEROBIC AND ANAEROBIC Blood Culture adequate volume   Culture   Final    NO GROWTH 5 DAYS Performed at [facility], [address], [place], [place] [address]    Report Status 02/24/2024 FINAL  Final     Labs: BNP (last 3 results) No results for input(s): "BNP" in the last 8760 hours. Basic Metabolic Panel: Recent Labs  Lab 02/21/24 1300 02/23/24 0402 02/24/24 0536 02/25/24 0429  NA 143 143 140 138  K 4.3 3.6 3.8 4.0  CL 108 107 101 103  CO2 22 27 28 28   GLUCOSE 263* 132* 124* 121*  BUN 19 18 15 15   CREATININE 0.86 0.80 0.77 0.88  CALCIUM 7.1* 7.8* 8.5* 8.1*   Liver Function Tests: Recent Labs  Lab 02/23/24 1130  AST 61*  ALT 45*  ALKPHOS 44  BILITOT 0.6  PROT 4.6*  ALBUMIN  2.0*   No results for input(s): "LIPASE", "AMYLASE" in the last 168 hours. No results for input(s): "AMMONIA" in the last 168 hours. CBC: Recent Labs  Lab 02/24/24 1326 02/25/24 0429  WBC 20.5* 18.7*  NEUTROABS 17.3* 14.2*  HGB  11.2* 11.3*  HCT 34.7* 34.5*  MCV 93.8 95.6  PLT 269 248   Cardiac Enzymes: Recent Labs  Lab 02/23/24 1130  CKTOTAL 288   BNP: Invalid input(s): "POCBNP" CBG: Recent Labs  Lab 02/26/24 1145 02/26/24 1623 02/26/24 2128 02/27/24 0622 02/27/24 1147  GLUCAP 127* 123* 242* 117* 198*   D-Dimer No results for input(s): "DDIMER" in the last 72 hours. Hgb A1c No results for input(s): "HGBA1C" in the last 72 hours. Lipid Profile No results for input(s): "CHOL", "HDL", "LDLCALC", "TRIG", "CHOLHDL", "LDLDIRECT" in the last 72 hours. Thyroid function studies No results for input(s): "TSH", "T4TOTAL", "T3FREE", "THYROIDAB" in the last 72 hours.  Invalid input(s): "FREET3"  Anemia work up No results for input(s): "VITAMINB12", "FOLATE", "FERRITIN", "TIBC", "IRON", "RETICCTPCT" in the last 72 hours. Urinalysis    Component Value Date/Time   COLORURINE YELLOW 02/19/2024 0809   APPEARANCEUR CLEAR 02/19/2024 0809   LABSPEC 1.021 02/19/2024 0809   PHURINE 5.0 02/19/2024 0809   GLUCOSEU 150 (A) 02/19/2024 0809   HGBUR LARGE (A) 02/19/2024 0809   BILIRUBINUR NEGATIVE 02/19/2024 0809   KETONESUR NEGATIVE 02/19/2024 0809   PROTEINUR 30 (A) 02/19/2024 0809   UROBILINOGEN 0.2 06/03/2013 1559   NITRITE NEGATIVE 02/19/2024 0809   LEUKOCYTESUR NEGATIVE 02/19/2024 0809   Sepsis Labs Recent Labs  Lab 02/24/24 1326 02/25/24 0429  WBC 20.5* 18.7*   Microbiology Recent Results (from the past 240 hours)  Culture, blood (Routine X 2) w Reflex to ID Panel     Status: None   Collection Time: 02/19/24  9:37 AM   Specimen: BLOOD RIGHT HAND  Result Value Ref Range Status   Specimen Description BLOOD RIGHT HAND  Final   Special Requests   Final    BOTTLES DRAWN AEROBIC AND ANAEROBIC Blood Culture results may not be optimal due to an inadequate volume of blood received in culture bottles   Culture   Final    NO GROWTH 5 DAYS Performed at [facility], [address] [place],  [place] [address]    Report Status 02/24/2024 FINAL  Final  Culture, blood (Routine X 2) w Reflex to ID Panel     Status: None   Collection Time: 02/19/24 10:19 AM   Specimen: BLOOD RIGHT ARM  Result Value Ref Range Status   Specimen Description BLOOD RIGHT ARM  Final   Special Requests   Final    BOTTLES DRAWN AEROBIC AND ANAEROBIC Blood Culture adequate volume   Culture   Final    NO GROWTH 5 DAYS Performed at [facility], [address], [place], [place] [address]    Report Status 02/24/2024 FINAL  Final    Procedures/Studies: DG CHEST PORT 1 VIEW Result Date: 02/25/2024 CLINICAL DATA:  Follow-up atelectasis. EXAM: PORTABLE CHEST 1 VIEW COMPARISON:  02/19/2024 FINDINGS: Overlying monitoring devices partially obscure the right lung base. Persistent right lung base atelectasis, suggestion of mild improvement although obscured. No new airspace disease. Stable heart size and mediastinal contours. No pneumothorax or pleural effusion. IMPRESSION: Persistent right lung base atelectasis, suggestion of mild improvement although obscured by overlying monitoring devices. Electronically Signed   By: Chadwick Colonel M.D.   On: 02/25/2024 19:07   DG Swallowing Func-Speech Pathology Result Date: 02/20/2024 Table formatting from the original result was not included. Modified Barium Swallow Study Patient Details Name: PHAROAH GOGGINS MRN: [id number] Date of Birth: 1951/02/24 Today's Date: 02/20/2024 HPI/PMH: HPI: Mr. Weed is a 73 yo male with who was brought to ED after being found down at home. LKW 5/21, presented 5/24. Dx with rhabdo, SIRS, AKI, polypharmacy (multiple sedating meds for tx of depression and Parkinson's), multiple wounds of skin, transaminitis, facial contusion. PMH depression, Parkinson's disease (not on treatment), insomnia, DMII, GERD, RLS. Clinical Impression: Pt demonstrates a mild oropharyngeal dysphagia with reliable airway protection (no penetration/aspiration), mild deficits in oral  preparation with prolonged mastication (partials not present for study), trace oral residue post-swallow, mild reduction in base-of-tongue to pharyngeal wall contact, leading to solid food residue in valleculae without awareness.  Pill not tested due to concerns for potential lodging in vallecular space.  Recommend starting a dysphagia 3 diet/thin liquids; crush meds in puree.  SLP will follow. Results shared with RN and RD.  DIGEST Swallow Severity Rating*             Safety: 0             Efficiency: 1             Overall Pharyngeal Swallow Severity: 1-mild 1: mild; 2: moderate; 3: severe; 4: profound *The Dynamic Imaging Grade of Swallowing Toxicity is standardized for the head and neck cancer population, however, demonstrates promising clinical applications across populations to standardize the clinical rating of pharyngeal swallow safety and severity. Factors that may increase risk of adverse event in presence of aspiration [name] & [name] 2021): No data recorded Recommendations/Plan: Swallowing Evaluation Recommendations Swallowing Evaluation Recommendations Recommendations: PO diet PO Diet Recommendation: Dysphagia 3 (Mechanical soft); Thin liquids (Level 0) Liquid Administration via: Cup; Straw Medication Administration: Crushed with puree Supervision: Full assist for feeding Oral care recommendations: Oral care BID (2x/day) Treatment Plan Treatment Plan Treatment recommendations: Therapy as outlined in treatment plan below Follow-up recommendations: -- (tba) Functional status assessment: Patient has had a recent decline in their functional status and demonstrates the ability to make significant improvements in function in a reasonable and predictable amount of time. Treatment frequency: Min 2x/week Treatment duration: 1 week Interventions: Patient/family education; Trials of upgraded texture/liquids; Diet toleration management by SLP Recommendations Recommendations for follow up therapy are one component  of a multi-disciplinary discharge planning process, led by the attending physician.  Recommendations may be updated based on patient status, additional functional criteria and insurance authorization. Assessment: Orofacial Exam: Orofacial Exam Oral Cavity: Oral Hygiene: WFL Oral Cavity - Dentition: Missing dentition Orofacial Anatomy: WFL Oral Motor/Sensory Function: WFL Anatomy: Anatomy: WFL Boluses Administered: Boluses Administered Boluses Administered: Thin liquids (Level 0); Mildly thick liquids (Level 2, nectar thick); Puree; Solid  Oral Impairment Domain: Oral Impairment Domain Lip Closure: No labial escape Tongue control during bolus hold: Cohesive bolus between tongue to palatal seal Bolus preparation/mastication: Slow prolonged chewing/mashing with complete recollection Bolus transport/lingual motion: Brisk tongue motion Oral residue: Trace residue lining oral structures Location of oral residue : Tongue; Floor of mouth Initiation of pharyngeal swallow : Pyriform sinuses  Pharyngeal Impairment Domain: Pharyngeal Impairment Domain Soft palate elevation: No bolus between soft palate (SP)/pharyngeal wall (PW) Laryngeal elevation: Partial superior movement of thyroid cartilage/partial approximation of arytenoids to epiglottic petiole Anterior hyoid excursion: Complete anterior movement Epiglottic movement: Complete inversion Laryngeal vestibule closure: Complete, no air/contrast in laryngeal vestibule Pharyngeal stripping wave : Present - diminished Pharyngoesophageal segment opening: Complete distension and complete duration, no obstruction of flow Tongue base retraction: Trace column of contrast or air between tongue base and PPW Pharyngeal residue: Collection of residue within or on pharyngeal structures Location of pharyngeal residue: Valleculae  Esophageal Impairment Domain: No data recorded Pill: Pill Consistency administered: -- (NT) Penetration/Aspiration Scale Score: Penetration/Aspiration Scale Score  1.  Material does not enter airway: Thin liquids (Level 0); Mildly thick liquids (Level 2, nectar thick); Puree; Solid Compensatory Strategies: Compensatory Strategies Compensatory strategies: Yes Straw: Effective   General Information: Caregiver present: No  Diet Prior to this Study: NPO   Temperature : Normal   Respiratory Status: WFL   Supplemental O2: None (Room air)   History of Recent Intubation: No  Behavior/Cognition: Pleasant mood Self-Feeding Abilities: Needs assist with self-feeding Baseline vocal quality/speech: Normal Volitional Cough: Able to elicit No data recorded Exam Limitations: No limitations Goal Planning: Prognosis for improved oropharyngeal function: Good No data recorded No data recorded No  data recorded Consulted and agree with results and recommendations: Patient; Dietitian; Nurse Pain: Pain Assessment Pain Assessment: No/denies pain Pain Score: 0 Faces Pain Scale: 0 Breathing: 0 Negative Vocalization: 0 Facial Expression: 0 Body Language: 0 Consolability: 0 PAINAD Score: 0 Pain Intervention(s): Limited activity within patient's tolerance; Monitored during session; Repositioned End of Session: Start Time:SLP Start Time (ACUTE ONLY): 0820 Stop Time: SLP Stop Time (ACUTE ONLY): 0843 Time Calculation:SLP Time Calculation (min) (ACUTE ONLY): 23 min Charges: SLP Evaluations $ SLP Speech Visit: 1 Visit SLP Evaluations $MBS Swallow: 1 Procedure $Swallowing Treatment: 1 Procedure SLP visit diagnosis: SLP Visit Diagnosis: Dysphagia, oropharyngeal phase (R13.12) Past Medical History: Past Medical History: Diagnosis Date  Allergic rhinitis due to pollen   Benign neoplasm of colon   Blisters with epidermal loss due to burn (second degree) of foot   Complication of anesthesia   pt woke up during saphenous vein surgery-had epidural  Constipation due to pain medication   Degeneration of lumbar or lumbosacral intervertebral disc   Diverticulosis of colon (without mention of hemorrhage)   DM (diabetes  mellitus) (HCC)   Esophageal reflux   Hypertrophy of prostate with urinary obstruction and other lower urinary tract symptoms (LUTS)   Loss of weight   Neuromuscular disorder (HCC)   peripheral neuropathy  Obesity, unspecified   Pain in limb   Restless legs syndrome (RLS)   Sleep disturbance 03/24/2014  Spinal stenosis, unspecified region other than cervical   Tobacco use disorder   Unspecified disease of pericardium   Unspecified essential hypertension   Unspecified local infection of skin and subcutaneous tissue   Varices of other sites  Past Surgical History: Past Surgical History: Procedure Laterality Date  BACK SURGERY    CATARACT EXTRACTION Bilateral   one in july and other in Aug.2023  COLONOSCOPY W/ POLYPECTOMY    CYST EXCISION Left 04/09/2022  Procedure: LEFT INDEX FINGER MUCOID CYST EXCISION;  Surgeon: Brunilda Capra, MD;  Location: [facility];  Service: Orthopedics;  Laterality: Left;  Bier block  CYST EXCISION Left 05/02/2023  Procedure: LEFT INDEX FINGER MUCOID CYST EXCISION AND DISTAL INTERPHALANGEAL JOINT DEBRIDEMENT;  Surgeon: Brunilda Capra, MD;  Location: [facility];  Service: Orthopedics;  Laterality: Left;  LUMBAR LAMINECTOMY/DECOMPRESSION MICRODISCECTOMY Bilateral 09/08/2015  Procedure: Laminectomy and Foraminotomy - Lumbar two-lumbar three bilateral;  Surgeon: Isadora Mar, MD;  Location: [facility];  Service: Neurosurgery;  Laterality: Bilateral;  SAPHENOUS VEIN GRAFT RESECTION Right 09/24/1989  TENDON EXPLORATION Left 04/09/2022  Procedure: DEBRIDEMENT DISTAL INTERPHALANGEAL JOINT LEFT INDEX FINGER;  Surgeon: Brunilda Capra, MD;  Location: [facility];  Service: Orthopedics;  Laterality: Left;  Bier block  TONSILLECTOMY    TRIGGER FINGER RELEASE Left 05/02/2023  Procedure: LEFT RING FINGER TRIGGER RELEASE;  Surgeon: Brunilda Capra, MD;  Location: [facility];  Service: Orthopedics;  Laterality: Left; Rheba Cedar 02/20/2024, 9:25 AM  Henri Loft. Beatris Lincoln, MA CCC/SLP Clinical Specialist - Acute Care SLP Acute Rehabilitation Services Office number [phone]  DG CHEST PORT 1 VIEW Result Date: 02/19/2024 CLINICAL DATA:  Fevers EXAM: PORTABLE CHEST 1 VIEW COMPARISON:  02/16/2024 FINDINGS: Cardiac shadow is within normal limits. Gastric catheter extends into the stomach. Right basilar atelectasis is noted. Focal confluent infiltrate is seen. No bony abnormality is noted. IMPRESSION: Mild right basilar atelectasis. Electronically Signed   By: Violeta Grey M.D.   On: 02/19/2024 10:29   DG Abd Portable 1V Result Date: 02/16/2024 CLINICAL DATA:  Enteric catheter placement EXAM: PORTABLE ABDOMEN -  1 VIEW COMPARISON:  None Available. FINDINGS: Frontal view of the lower chest and upper abdomen demonstrates enteric catheter passing below diaphragm, tip and side port projecting over gastric fundus. Bowel gas pattern is unremarkable. Lung bases are clear. IMPRESSION: 1. Enteric catheter tip projecting over gastric fundus. Electronically Signed   By: Bobbye Burrow M.D.   On: 02/16/2024 15:52   DG CHEST PORT 1 VIEW Result Date: 02/16/2024 CLINICAL DATA:  [id number] Fever [id number] EXAM: PORTABLE CHEST - 1 VIEW COMPARISON:  02/14/2024 FINDINGS: Lungs are clear. Heart size and mediastinal contours are within normal limits. No effusion. Visualized bones unremarkable. IMPRESSION: No acute cardiopulmonary disease. Electronically Signed   By: Nicoletta Barrier M.D.   On: 02/16/2024 14:11   DG Hip Unilat W or Wo Pelvis 2-3 Views Left Result Date: 02/14/2024 CLINICAL DATA:  Pain after fall. EXAM: DG HIP (WITH OR WITHOUT PELVIS) 2-3V LEFT COMPARISON:  None Available. FINDINGS: No evidence of acute fracture of the pelvis or left hip. No hip dislocation. Mild bilateral hip osteoarthritis with acetabular spurring and subchondral cysts. The pubic rami are intact. Pubic symphysis are congruent. Surgical hardware in the lumbar spine is partially included. IMPRESSION: 1. No acute  fracture or subluxation of the pelvis or left hip. 2. Mild bilateral hip osteoarthritis. Electronically Signed   By: Chadwick Colonel M.D.   On: 02/14/2024 14:15   DG Chest 1 View Result Date: 02/14/2024 CLINICAL DATA:  Status post fall. Unwitnessed fall, unknown down time. EXAM: CHEST  1 VIEW COMPARISON:  08/30/2015 FINDINGS: Lung volumes are low. The cardiomediastinal contours are normal for technique. The lungs are clear. Pulmonary vasculature is normal. No consolidation, pleural effusion, or pneumothorax. No acute osseous abnormalities are seen. IMPRESSION: Low lung volumes without acute finding. Electronically Signed   By: Chadwick Colonel M.D.   On: 02/14/2024 14:15   CT Head Wo Contrast Result Date: 02/14/2024 CLINICAL DATA:  Altered mental status, found on ground, unknown down time EXAM: CT HEAD WITHOUT CONTRAST CT MAXILLOFACIAL WITHOUT CONTRAST CT CERVICAL SPINE WITHOUT CONTRAST TECHNIQUE: Multidetector CT imaging of the head, cervical spine, and maxillofacial structures were performed using the standard protocol without intravenous contrast. Multiplanar CT image reconstructions of the cervical spine and maxillofacial structures were also generated. RADIATION DOSE REDUCTION: This exam was performed according to the departmental dose-optimization program which includes automated exposure control, adjustment of the mA and/or kV according to patient size and/or use of iterative reconstruction technique. COMPARISON:  None Available. FINDINGS: CT HEAD FINDINGS Brain: No evidence of acute infarction, hemorrhage, hydrocephalus, extra-axial collection or mass lesion/mass effect. Nonacute right occipital encephalomalacia. Vascular: No hyperdense vessel or unexpected calcification. CT FACIAL BONES FINDINGS Skull: Normal. Negative for fracture or focal lesion. Facial bones: No displaced fractures or dislocations. Sinuses/Orbits: No acute finding. Other: Soft tissue contusion overlying the right orbit and  forehead. Patient is edentulous. CT CERVICAL SPINE FINDINGS Alignment: Normal. Skull base and vertebrae: No acute fracture. No primary bone lesion or focal pathologic process. Soft tissues and spinal canal: No prevertebral fluid or swelling. No visible canal hematoma. Disc levels: Mild-to-moderate cervical disc degenerative disease of the lower cervical levels. Upper chest: Negative. Other: None. IMPRESSION: 1. No acute intracranial pathology. Nonacute right occipital encephalomalacia. 2. No displaced fractures or dislocations of the facial bones. 3. Soft tissue contusion overlying the right orbit and forehead. 4. No fracture or static subluxation of the cervical spine. 5. Mild-to-moderate cervical disc degenerative disease of the lower cervical levels. Electronically Signed   By: Fredricka Jenny  M.D.   On: 02/14/2024 13:42   CT Cervical Spine Wo Contrast Result Date: 02/14/2024 CLINICAL DATA:  Altered mental status, found on ground, unknown down time EXAM: CT HEAD WITHOUT CONTRAST CT MAXILLOFACIAL WITHOUT CONTRAST CT CERVICAL SPINE WITHOUT CONTRAST TECHNIQUE: Multidetector CT imaging of the head, cervical spine, and maxillofacial structures were performed using the standard protocol without intravenous contrast. Multiplanar CT image reconstructions of the cervical spine and maxillofacial structures were also generated. RADIATION DOSE REDUCTION: This exam was performed according to the departmental dose-optimization program which includes automated exposure control, adjustment of the mA and/or kV according to patient size and/or use of iterative reconstruction technique. COMPARISON:  None Available. FINDINGS: CT HEAD FINDINGS Brain: No evidence of acute infarction, hemorrhage, hydrocephalus, extra-axial collection or mass lesion/mass effect. Nonacute right occipital encephalomalacia. Vascular: No hyperdense vessel or unexpected calcification. CT FACIAL BONES FINDINGS Skull: Normal. Negative for fracture or focal  lesion. Facial bones: No displaced fractures or dislocations. Sinuses/Orbits: No acute finding. Other: Soft tissue contusion overlying the right orbit and forehead. Patient is edentulous. CT CERVICAL SPINE FINDINGS Alignment: Normal. Skull base and vertebrae: No acute fracture. No primary bone lesion or focal pathologic process. Soft tissues and spinal canal: No prevertebral fluid or swelling. No visible canal hematoma. Disc levels: Mild-to-moderate cervical disc degenerative disease of the lower cervical levels. Upper chest: Negative. Other: None. IMPRESSION: 1. No acute intracranial pathology. Nonacute right occipital encephalomalacia. 2. No displaced fractures or dislocations of the facial bones. 3. Soft tissue contusion overlying the right orbit and forehead. 4. No fracture or static subluxation of the cervical spine. 5. Mild-to-moderate cervical disc degenerative disease of the lower cervical levels. Electronically Signed   By: Fredricka Jenny M.D.   On: 02/14/2024 13:42   CT Maxillofacial Wo Contrast Result Date: 02/14/2024 CLINICAL DATA:  Altered mental status, found on ground, unknown down time EXAM: CT HEAD WITHOUT CONTRAST CT MAXILLOFACIAL WITHOUT CONTRAST CT CERVICAL SPINE WITHOUT CONTRAST TECHNIQUE: Multidetector CT imaging of the head, cervical spine, and maxillofacial structures were performed using the standard protocol without intravenous contrast. Multiplanar CT image reconstructions of the cervical spine and maxillofacial structures were also generated. RADIATION DOSE REDUCTION: This exam was performed according to the departmental dose-optimization program which includes automated exposure control, adjustment of the mA and/or kV according to patient size and/or use of iterative reconstruction technique. COMPARISON:  None Available. FINDINGS: CT HEAD FINDINGS Brain: No evidence of acute infarction, hemorrhage, hydrocephalus, extra-axial collection or mass lesion/mass effect. Nonacute right  occipital encephalomalacia. Vascular: No hyperdense vessel or unexpected calcification. CT FACIAL BONES FINDINGS Skull: Normal. Negative for fracture or focal lesion. Facial bones: No displaced fractures or dislocations. Sinuses/Orbits: No acute finding. Other: Soft tissue contusion overlying the right orbit and forehead. Patient is edentulous. CT CERVICAL SPINE FINDINGS Alignment: Normal. Skull base and vertebrae: No acute fracture. No primary bone lesion or focal pathologic process. Soft tissues and spinal canal: No prevertebral fluid or swelling. No visible canal hematoma. Disc levels: Mild-to-moderate cervical disc degenerative disease of the lower cervical levels. Upper chest: Negative. Other: None. IMPRESSION: 1. No acute intracranial pathology. Nonacute right occipital encephalomalacia. 2. No displaced fractures or dislocations of the facial bones. 3. Soft tissue contusion overlying the right orbit and forehead. 4. No fracture or static subluxation of the cervical spine. 5. Mild-to-moderate cervical disc degenerative disease of the lower cervical levels. Electronically Signed   By: Fredricka Jenny M.D.   On: 02/14/2024 13:42     Time coordinating discharge: Over 30 minutes  Faith Homes, MD  [facility] 02/27/2024, 12:47 PM

## 2024-02-27 NOTE — Progress Notes (Signed)
  Inpatient Rehabilitation Admissions Coordinator   Met with patient and sister at bedside for rehab assessment. We discussed goals and expectations of a possible CIR admit. Patient does not have caregiver supports after a CIR admit and I would not expect him to reach Mod I level with a CIR admit. I recommend to continue to pursue [facility] for SNF. Please call me with any questions.   Jeannetta Millman, RN, MSN Rehab Admissions Coordinator [phone]

## 2024-02-27 NOTE — TOC Transition Note (Signed)
 Transition of Care [facility]) - Discharge Note   Patient Details  Name: Douglas Price MRN: [id number] Date of Birth: 04-14-1951  Transition of Care [facility]) CM/SW Contact:  Tandy Fam, LCSW Phone Number: 02/27/2024, 1:18 PM   Clinical Narrative:   CSW updated by [facility] that authorization is approved. CSW updated MD for discharge. CSW sent discharge information, confirmed receipt and bed availability. CSW met with patient and sister, Sheryle Donning, at bedside to provide update, both appreciative. Transport arranged with [facility] for next available.  Nurse to call report to [phone], Room 505.    Final next level of care: Skilled Nursing Facility Barriers to Discharge: Barriers Resolved   Patient Goals and CMS Choice   CMS Medicare.gov Compare Post Acute Care list provided to:: Patient Represenative (must comment) Choice offered to / list presented to : Sibling      Discharge Placement              Patient chooses bed at: [facility] Patient to be transferred to facility by: [facility] Name of family member notified: Sheryle Donning Patient and family notified of of transfer: 02/27/24  Discharge Plan and Services Additional resources added to the After Visit Summary for     Discharge Planning Services: CM Consult Post Acute Care Choice: IP Rehab                               Social Drivers of Health (SDOH) Interventions SDOH Screenings   Food Insecurity: No Food Insecurity (02/16/2024)  Housing: Low Risk  (02/16/2024)  Transportation Needs: No Transportation Needs (02/16/2024)  Utilities: Not At Risk (02/16/2024)  Depression (PHQ2-9): Medium Risk (02/11/2023)  Financial Resource Strain: Low Risk  (02/07/2023)  Social Connections: Moderately Isolated (02/16/2024)  Tobacco Use: High Risk (02/20/2024)     Readmission Risk Interventions     No data to display

## 2024-02-27 NOTE — Progress Notes (Signed)
 Report given to Tamara at [facility].

## 2024-03-06 ENCOUNTER — Ambulatory Visit: Admitting: Psychiatry

## 2024-03-06 ENCOUNTER — Other Ambulatory Visit: Admitting: Neurology

## 2024-03-19 ENCOUNTER — Emergency Department (HOSPITAL_COMMUNITY)
Admission: EM | Admit: 2024-03-19 | Discharge: 2024-03-19 | Disposition: A | Discharge status: Home / Self Care | Attending: Emergency Medicine | Admitting: Emergency Medicine

## 2024-03-19 ENCOUNTER — Emergency Department (HOSPITAL_COMMUNITY)

## 2024-03-19 ENCOUNTER — Other Ambulatory Visit: Payer: Self-pay

## 2024-03-19 ENCOUNTER — Ambulatory Visit: Admitting: Psychiatry

## 2024-03-19 DIAGNOSIS — L89159 Pressure ulcer of sacral region, unspecified stage: Secondary | ICD-10-CM | POA: Diagnosis present

## 2024-03-19 DIAGNOSIS — E119 Type 2 diabetes mellitus without complications: Secondary | ICD-10-CM | POA: Insufficient documentation

## 2024-03-19 DIAGNOSIS — S31000A Unspecified open wound of lower back and pelvis without penetration into retroperitoneum, initial encounter: Secondary | ICD-10-CM

## 2024-03-19 DIAGNOSIS — R251 Tremor, unspecified: Secondary | ICD-10-CM | POA: Insufficient documentation

## 2024-03-19 DIAGNOSIS — M4628 Osteomyelitis of vertebra, sacral and sacrococcygeal region: Secondary | ICD-10-CM | POA: Diagnosis not present

## 2024-03-19 DIAGNOSIS — Z7984 Long term (current) use of oral hypoglycemic drugs: Secondary | ICD-10-CM | POA: Insufficient documentation

## 2024-03-19 DIAGNOSIS — K4090 Unilateral inguinal hernia, without obstruction or gangrene, not specified as recurrent: Secondary | ICD-10-CM | POA: Diagnosis not present

## 2024-03-19 DIAGNOSIS — D72829 Elevated white blood cell count, unspecified: Secondary | ICD-10-CM | POA: Diagnosis not present

## 2024-03-19 DIAGNOSIS — M533 Sacrococcygeal disorders, not elsewhere classified: Secondary | ICD-10-CM | POA: Diagnosis present

## 2024-03-19 DIAGNOSIS — M810 Age-related osteoporosis without current pathological fracture: Secondary | ICD-10-CM | POA: Insufficient documentation

## 2024-03-19 DIAGNOSIS — I700 Atherosclerosis of aorta: Secondary | ICD-10-CM | POA: Diagnosis not present

## 2024-03-19 DIAGNOSIS — I10 Essential (primary) hypertension: Secondary | ICD-10-CM | POA: Diagnosis not present

## 2024-03-19 DIAGNOSIS — E1142 Type 2 diabetes mellitus with diabetic polyneuropathy: Secondary | ICD-10-CM | POA: Diagnosis not present

## 2024-03-19 LAB — COMPREHENSIVE METABOLIC PANEL WITH GFR
ALT: 14 U/L (ref 0–44)
AST: 15 U/L (ref 15–41)
Albumin: 2.8 g/dL — ABNORMAL LOW (ref 3.5–5.0)
Alkaline Phosphatase: 67 U/L (ref 38–126)
Anion gap: 13 (ref 5–15)
BUN: 12 mg/dL (ref 8–23)
CO2: 24 mmol/L (ref 22–32)
Calcium: 8.7 mg/dL — ABNORMAL LOW (ref 8.9–10.3)
Chloride: 101 mmol/L (ref 98–111)
Creatinine, Ser: 0.85 mg/dL (ref 0.61–1.24)
GFR, Estimated: >60 mL/min (ref >60)
Glucose, Bld: 103 mg/dL — ABNORMAL HIGH (ref 70–99)
Potassium: 4.3 mmol/L (ref 3.5–5.1)
Sodium: 138 mmol/L (ref 135–145)
Total Bilirubin: 0.5 mg/dL (ref 0.0–1.2)
Total Protein: 5.9 g/dL — ABNORMAL LOW (ref 6.5–8.1)

## 2024-03-19 LAB — CBC WITH DIFFERENTIAL/PLATELET
Abs Immature Granulocytes: 0.08 10*3/uL — ABNORMAL HIGH (ref 0.00–0.07)
Basophils Absolute: 0.1 10*3/uL (ref 0.0–0.1)
Basophils Relative: 1 %
Eosinophils Absolute: 0.5 10*3/uL (ref 0.0–0.5)
Eosinophils Relative: 3 %
HCT: 35.9 % — ABNORMAL LOW (ref 39.0–52.0)
Hemoglobin: 11.4 g/dL — ABNORMAL LOW (ref 13.0–17.0)
Immature Granulocytes: 1 %
Lymphocytes Relative: 11 %
Lymphs Abs: 1.6 10*3/uL (ref 0.7–4.0)
MCH: 30.2 pg (ref 26.0–34.0)
MCHC: 31.8 g/dL (ref 30.0–36.0)
MCV: 95.2 fL (ref 80.0–100.0)
Monocytes Absolute: 1.1 10*3/uL — ABNORMAL HIGH (ref 0.1–1.0)
Monocytes Relative: 7 %
Neutro Abs: 11.8 10*3/uL — ABNORMAL HIGH (ref 1.7–7.7)
Neutrophils Relative %: 77 %
Platelets: 341 10*3/uL (ref 150–400)
RBC: 3.77 MIL/uL — ABNORMAL LOW (ref 4.22–5.81)
RDW: 14 % (ref 11.5–15.5)
WBC: 15.2 10*3/uL — ABNORMAL HIGH (ref 4.0–10.5)
nRBC: 0 % (ref 0.0–0.2)

## 2024-03-19 LAB — SEDIMENTATION RATE: Sed Rate: 23 mm/h — ABNORMAL HIGH (ref 0–16)

## 2024-03-19 LAB — C-REACTIVE PROTEIN: CRP: 0.8 mg/dL (ref <1.0)

## 2024-03-19 LAB — I-STAT CG4 LACTIC ACID, ED
Lactic Acid, Venous: 2 mmol/L (ref 0.5–1.9)
Lactic Acid, Venous: 1.3 mmol/L (ref 0.5–1.9)

## 2024-03-19 MED ORDER — IOHEXOL 350 MG/ML SOLN
75.0000 mL | Freq: Once | INTRAVENOUS | Status: AC | PRN
  Administered 2024-03-19: 75 mL via INTRAVENOUS

## 2024-03-19 MED ORDER — SODIUM CHLORIDE 0.9 % IV SOLN
1.0000 g | Freq: Once | INTRAVENOUS | Status: AC
  Administered 2024-03-19: 1 g via INTRAVENOUS
  Filled 2024-03-19: qty 10

## 2024-03-19 MED ORDER — AMOXICILLIN-POT CLAVULANATE 875-125 MG PO TABS: 1.0000 | ORAL_TABLET | Freq: Two times a day (BID) | ORAL | 0 refills | Status: DC

## 2024-03-19 NOTE — ED Notes (Signed)
 Attempted to call [facility] to give transfer of care report.  No answer. x2

## 2024-03-19 NOTE — ED Provider Notes (Signed)
 [place] EMERGENCY DEPARTMENT AT [facility] Provider Note   CSN: [id number] Arrival date & time: 03/19/24  1316     Patient presents with: Possible Infection   Douglas Price is a 73 y.o. male.   HPI 73 year old male presents for evaluation of a sacral wound.  He has had these wounds for several weeks along with sacral pain for several weeks.  Is very uncomfortable.  No fevers.  He is not sure if it is draining.  The wound care nurse taught and they did an x-ray of his coccyx but he does not know the results.  He was sent here for evaluation of possible osteomyelitis.  He denies any fevers.  No abdominal pain.  Prior to Admission medications   Medication Sig Start Date End Date Taking? Authorizing Provider  buPROPion  (WELLBUTRIN  XL) 300 MG 24 hr tablet TAKE 1 TABLET(300 MG) BY MOUTH DAILY 02/18/24   Rhys Boyer T, PA-C  lithium  carbonate 150 MG capsule TAKE 1 CAPSULE(150 MG) BY MOUTH EVERY EVENING 02/18/24   Hurst, Boyer T, PA-C  carbidopa -levodopa  (SINEMET  IR) 25-100 MG tablet Take 1 tablet by mouth 3 (three) times daily. 02/27/24   Patsy Lenis, MD  clonazePAM  (KLONOPIN ) 0.5 MG tablet Take 1 tablet (0.5 mg total) by mouth 2 (two) times daily as needed for anxiety. 02/27/24   Patsy Lenis, MD  DULoxetine  (CYMBALTA ) 60 MG capsule Take 120 mg by mouth daily.     [provider]  dutasteride  (AVODART ) 0.5 MG capsule Take 0.5 mg by mouth daily.    [provider]  HYDROcodone -acetaminophen  (NORCO/VICODIN) 5-325 MG tablet 1-2 tabs PO q6 hours prn pain 02/27/24   Patsy Lenis, MD  loratadine (CLARITIN) 10 MG tablet Take 10 mg by mouth daily as needed for allergies, rhinitis or itching.    [provider]  metFORMIN  (GLUCOPHAGE ) 500 MG tablet Take 1,000 mg by mouth 2 (two) times daily with a meal.    [provider]  mirtazapine  (REMERON ) 15 MG tablet Take 15 mg by mouth at bedtime.    [provider]  nebivolol  (BYSTOLIC ) 2.5 MG tablet  Take 2.5 mg by mouth daily.    [provider]  omeprazole (PRILOSEC) 20 MG capsule Take 20 mg by mouth 2 (two) times daily before a meal.     [provider]  pregabalin  (LYRICA ) 75 MG capsule Take 1 capsule (75 mg total) by mouth 2 (two) times daily. 02/27/24   Patsy Lenis, MD  tamsulosin  (FLOMAX ) 0.4 MG CAPS capsule Take 0.4 mg by mouth daily.    [provider]  telmisartan  (MICARDIS ) 40 MG tablet Take 40 mg by mouth daily.    [provider]  temazepam  (RESTORIL ) 15 MG capsule Take 1 capsule (15 mg total) by mouth at bedtime as needed for sleep. 02/27/24   Patsy Lenis, MD    Allergies: Avelox [moxifloxacin], Quinolones, and Lexapro [escitalopram]    Review of Systems  Constitutional:  Negative for fever.  Gastrointestinal:  Negative for abdominal pain.  Musculoskeletal:  Positive for back pain.  Skin:  Positive for wound.    Updated Vital Signs BP 122/63   Pulse 77   Temp 98.4 F (36.9 C) (Oral)   Resp 18   Ht 6' 3 (1.905 m)   Wt 89.4 kg   SpO2 100%   BMI 24.62 kg/m   Physical Exam Vitals and nursing note reviewed. Exam conducted with a chaperone present.  Constitutional:      Appearance: He  is well-developed.  HENT:     Head: Normocephalic and atraumatic.   Cardiovascular:     Rate and Rhythm: Normal rate and regular rhythm.     Heart sounds: Normal heart sounds.  Pulmonary:     Effort: Pulmonary effort is normal.     Breath sounds: Normal breath sounds.  Abdominal:     General: There is no distension.     Palpations: Abdomen is soft.     Tenderness: There is no abdominal tenderness.   Musculoskeletal:       Back:   Skin:    General: Skin is warm and dry.   Neurological:     Mental Status: He is alert.     Motor: Tremor present.     (all labs ordered are listed, but only abnormal results are displayed) Labs Reviewed  COMPREHENSIVE METABOLIC PANEL WITH GFR - Abnormal; Notable for the following components:       Result Value   Glucose, Bld 103 (*)    Calcium 8.7 (*)    Total Protein 5.9 (*)    Albumin  2.8 (*)    All other components within normal limits  CBC WITH DIFFERENTIAL/PLATELET - Abnormal; Notable for the following components:   WBC 15.2 (*)    RBC 3.77 (*)    Hemoglobin 11.4 (*)    HCT 35.9 (*)    Neutro Abs 11.8 (*)    Monocytes Absolute 1.1 (*)    Abs Immature Granulocytes 0.08 (*)    All other components within normal limits  CULTURE, BLOOD (ROUTINE X 2)  CULTURE, BLOOD (ROUTINE X 2)    EKG: EKG Interpretation Date/Time:  Thursday March 19 2024 13:29:45 EDT Ventricular Rate:  73 PR Interval:  63 QRS Duration:  96 QT Interval:  365 QTC Calculation: 403 R Axis:   -27  Text Interpretation: Sinus rhythm Short PR interval Borderline left axis deviation no acute ST/T changes Confirmed by Freddi Hamilton [id number] on 03/19/2024 1:37:11 PM  Radiology: No results found.   Procedures   Medications Ordered in the ED - No data to display  Clinical Course as of 03/19/24 1539  Thu Mar 19, 2024  1526 Received sign out from Dr. Freddi, pending CT scan. Has sacral ulcer, concern for possible infection. Labs with leukocytosis [WS]    Clinical Course User Index [WS] Francesca Elsie CROME, MD                                 Medical Decision Making Amount and/or Complexity of Data Reviewed Labs: ordered.    Details: Leukocytosis Radiology: ordered.   Patient presents for evaluation of his sacral wound.  Looking at the chart, this appears to have progressed since last month.  It is still relatively superficial.  Will get a CT to evaluate for deeper infection.  Vital signs are reassuring. Care transferred to Dr. Francesca.     Final diagnoses:  None    ED Discharge Orders     None          Freddi Hamilton, MD 03/19/24 1539

## 2024-03-19 NOTE — Discharge Instructions (Addendum)
 We have evaluated you for your pressure wound.  You may have an early infection on the wound.  We have prescribed you an antibiotic to take for this.  Please make sure that they are helping to turn you at your nursing facility.  These wounds get worse if you are putting pressure on the wound.  If you spent too much time on your bottom, the wound will get worse.  Your nursing facility should be familiar with wound care and how to manage this.  Please follow-up with the wound care center. You or your nursing facility should be able to call to help set up an appointment for this.  If the wound is worsening, or you develop any increasing pain, drainage from the wound, fevers, or any other new symptoms, please return to the emergency department

## 2024-03-19 NOTE — ED Triage Notes (Signed)
 Pt bibems from [facility]. Pt has 2 ulcers on sacrum area, wound specialist is concerned about infection. Pain 7/10 BP 118/66 HR 78 RA 96% CBG 115, Type II Diabetic

## 2024-03-19 NOTE — ED Provider Notes (Signed)
   ED Course / MDM   Clinical Course as of 03/19/24 1903  Thu Mar 19, 2024  1526 Received sign out from Dr. Freddi, pending CT scan. Has sacral ulcer, concern for possible infection. Labs with leukocytosis [WS]  1646 Lactic Acid, Venous(!!): 2.0 [WS]  1901 Lactic Acid, Venous: 1.3 [WS]  1902 CT scan without signs of osteomyelitis or deeper infection.  No abscess.  Inflammatory markers intermediate, CRP is normal.  ESR is borderline.  Labs with chronic leukocytosis.  Lactic acid initially borderline, was rechecked without intervention and actually normal.  Patient not septic.  Suspect pressure wound, patient reports that he is not getting wound care at his nursing facility and not being turned, would benefit from better wound care.  Given erythema about the wound, may have early infection, given dose of ceftriaxone  and prescribed Augmentin.  Recommended strict ER precautions for any worsening and follow-up with the wound care center. Will discharge patient to home. All questions answered. Patient comfortable with plan of discharge. Return precautions discussed with patient and specified on the after visit summary.  [WS]    Clinical Course User Index [WS] Francesca Elsie CROME, MD   Medical Decision Making Amount and/or Complexity of Data Reviewed Labs: ordered. Decision-making details documented in ED Course. Radiology: ordered.  Risk Prescription drug management.      Francesca Elsie CROME, MD 03/19/24 [id number]

## 2024-03-19 NOTE — ED Notes (Signed)
 Attempted to call [facility] to give transfer of care report.  No answer.

## 2024-03-19 NOTE — ED Notes (Signed)
 Attempted to call [facility] to give transfer of care report.  No answer. x3

## 2024-03-19 NOTE — ED Notes (Signed)
 Pt transported for imagining

## 2024-03-19 NOTE — Progress Notes (Signed)
 No-show/Short-notice cancellation note [name], PhD, [facility]  Patient ID: Douglas Price     MRN: [id number]     Date: 03/19/2024     Appt time: 4pm  Pt scheduled for therapy but has been through hospitalization for sepsis and referral to [facility] SN[facility] since then.  NS today.  LM inquiring with SNF about status.  Subsequently found to be in [facility] ED today, messaged nurse to relay reassurance if appropriate, advise us  if appropriate if he is expected to be unable to continue appts.  Waive fee, anticipate guidance about removing or keeping future appts.  Discovered in chart review that he was accidentally but successfully treated with carbidopa /levodopa  during his sepsis stay and was responding.  [name], PhD [name], PhD LP Clinical Psychologist, [facility] [address] [place], [place] [address] [phone]

## 2024-03-23 ENCOUNTER — Encounter: Payer: Self-pay | Admitting: Neurology

## 2024-03-24 LAB — CULTURE, BLOOD (ROUTINE X 2)
Culture: NO GROWTH
Culture: NO GROWTH
Special Requests: ADEQUATE
Special Requests: ADEQUATE

## 2024-03-30 DIAGNOSIS — F320 Major depressive disorder, single episode, mild: Secondary | ICD-10-CM | POA: Diagnosis not present

## 2024-03-30 DIAGNOSIS — G20C Parkinsonism, unspecified: Secondary | ICD-10-CM | POA: Diagnosis not present

## 2024-03-30 DIAGNOSIS — G4700 Insomnia, unspecified: Secondary | ICD-10-CM | POA: Diagnosis not present

## 2024-03-30 DIAGNOSIS — R52 Pain, unspecified: Secondary | ICD-10-CM | POA: Diagnosis not present

## 2024-03-30 DIAGNOSIS — K2100 Gastro-esophageal reflux disease with esophagitis, without bleeding: Secondary | ICD-10-CM

## 2024-03-30 NOTE — Telephone Encounter (Signed)
 Thanks for the update on Bill.

## 2024-04-03 ENCOUNTER — Other Ambulatory Visit: Payer: Self-pay | Admitting: Internal Medicine

## 2024-04-03 ENCOUNTER — Other Ambulatory Visit: Admitting: Neurology

## 2024-04-03 MED ORDER — HYDROCODONE-ACETAMINOPHEN 5-325 MG PO TABS: 1.0000 | ORAL_TABLET | Freq: Four times a day (QID) | ORAL | 0 refills | Status: DC | PRN

## 2024-04-08 ENCOUNTER — Ambulatory Visit: Admitting: Neurology

## 2024-04-09 ENCOUNTER — Ambulatory Visit: Admitting: Psychiatry

## 2024-04-23 ENCOUNTER — Inpatient Hospital Stay (HOSPITAL_COMMUNITY)

## 2024-04-23 ENCOUNTER — Emergency Department (HOSPITAL_COMMUNITY)

## 2024-04-23 ENCOUNTER — Encounter (HOSPITAL_COMMUNITY): Payer: Self-pay

## 2024-04-23 ENCOUNTER — Inpatient Hospital Stay (HOSPITAL_COMMUNITY)
Admission: EM | Admit: 2024-04-23 | Discharge: 2024-04-30 | DRG: 871 | Disposition: A | Discharge status: Skilled Nursing Facility | Source: Skilled Nursing Facility | Attending: Internal Medicine | Admitting: Internal Medicine

## 2024-04-23 ENCOUNTER — Other Ambulatory Visit: Payer: Self-pay

## 2024-04-23 DIAGNOSIS — F17290 Nicotine dependence, other tobacco product, uncomplicated: Secondary | ICD-10-CM | POA: Diagnosis present

## 2024-04-23 DIAGNOSIS — Z8249 Family history of ischemic heart disease and other diseases of the circulatory system: Secondary | ICD-10-CM

## 2024-04-23 DIAGNOSIS — Z7984 Long term (current) use of oral hypoglycemic drugs: Secondary | ICD-10-CM

## 2024-04-23 DIAGNOSIS — G2581 Restless legs syndrome: Secondary | ICD-10-CM | POA: Diagnosis present

## 2024-04-23 DIAGNOSIS — K219 Gastro-esophageal reflux disease without esophagitis: Secondary | ICD-10-CM | POA: Diagnosis present

## 2024-04-23 DIAGNOSIS — G4700 Insomnia, unspecified: Secondary | ICD-10-CM | POA: Diagnosis present

## 2024-04-23 DIAGNOSIS — N390 Urinary tract infection, site not specified: Secondary | ICD-10-CM | POA: Diagnosis present

## 2024-04-23 DIAGNOSIS — F32A Depression, unspecified: Secondary | ICD-10-CM | POA: Diagnosis present

## 2024-04-23 DIAGNOSIS — E1142 Type 2 diabetes mellitus with diabetic polyneuropathy: Secondary | ICD-10-CM | POA: Diagnosis present

## 2024-04-23 DIAGNOSIS — M4628 Osteomyelitis of vertebra, sacral and sacrococcygeal region: Secondary | ICD-10-CM | POA: Diagnosis present

## 2024-04-23 DIAGNOSIS — Z801 Family history of malignant neoplasm of trachea, bronchus and lung: Secondary | ICD-10-CM

## 2024-04-23 DIAGNOSIS — T148XXA Other injury of unspecified body region, initial encounter: Secondary | ICD-10-CM | POA: Diagnosis present

## 2024-04-23 DIAGNOSIS — A419 Sepsis, unspecified organism: Secondary | ICD-10-CM | POA: Diagnosis not present

## 2024-04-23 DIAGNOSIS — G20A1 Parkinson's disease without dyskinesia, without mention of fluctuations: Secondary | ICD-10-CM | POA: Diagnosis present

## 2024-04-23 DIAGNOSIS — L0390 Cellulitis, unspecified: Secondary | ICD-10-CM | POA: Diagnosis not present

## 2024-04-23 DIAGNOSIS — R6520 Severe sepsis without septic shock: Secondary | ICD-10-CM | POA: Diagnosis present

## 2024-04-23 DIAGNOSIS — F419 Anxiety disorder, unspecified: Secondary | ICD-10-CM | POA: Diagnosis present

## 2024-04-23 DIAGNOSIS — Y846 Urinary catheterization as the cause of abnormal reaction of the patient, or of later complication, without mention of misadventure at the time of the procedure: Secondary | ICD-10-CM | POA: Diagnosis present

## 2024-04-23 DIAGNOSIS — R1310 Dysphagia, unspecified: Secondary | ICD-10-CM | POA: Diagnosis present

## 2024-04-23 DIAGNOSIS — Z881 Allergy status to other antibiotic agents status: Secondary | ICD-10-CM

## 2024-04-23 DIAGNOSIS — Z1152 Encounter for screening for COVID-19: Secondary | ICD-10-CM

## 2024-04-23 DIAGNOSIS — L89154 Pressure ulcer of sacral region, stage 4: Secondary | ICD-10-CM | POA: Diagnosis present

## 2024-04-23 DIAGNOSIS — E872 Acidosis, unspecified: Secondary | ICD-10-CM | POA: Diagnosis present

## 2024-04-23 DIAGNOSIS — A4159 Other Gram-negative sepsis: Secondary | ICD-10-CM | POA: Diagnosis present

## 2024-04-23 DIAGNOSIS — S31000A Unspecified open wound of lower back and pelvis without penetration into retroperitoneum, initial encounter: Secondary | ICD-10-CM

## 2024-04-23 DIAGNOSIS — T83518A Infection and inflammatory reaction due to other urinary catheter, initial encounter: Secondary | ICD-10-CM | POA: Diagnosis present

## 2024-04-23 DIAGNOSIS — I10 Essential (primary) hypertension: Secondary | ICD-10-CM | POA: Diagnosis present

## 2024-04-23 DIAGNOSIS — G20C Parkinsonism, unspecified: Secondary | ICD-10-CM | POA: Diagnosis not present

## 2024-04-23 DIAGNOSIS — Z888 Allergy status to other drugs, medicaments and biological substances status: Secondary | ICD-10-CM

## 2024-04-23 DIAGNOSIS — N401 Enlarged prostate with lower urinary tract symptoms: Secondary | ICD-10-CM | POA: Diagnosis present

## 2024-04-23 DIAGNOSIS — L03312 Cellulitis of back [any part except buttock]: Secondary | ICD-10-CM | POA: Diagnosis present

## 2024-04-23 DIAGNOSIS — R338 Other retention of urine: Secondary | ICD-10-CM | POA: Diagnosis present

## 2024-04-23 DIAGNOSIS — E876 Hypokalemia: Secondary | ICD-10-CM | POA: Diagnosis present

## 2024-04-23 DIAGNOSIS — Z800 Family history of malignant neoplasm of digestive organs: Secondary | ICD-10-CM

## 2024-04-23 DIAGNOSIS — Z7401 Bed confinement status: Secondary | ICD-10-CM

## 2024-04-23 DIAGNOSIS — Z9889 Other specified postprocedural states: Secondary | ICD-10-CM

## 2024-04-23 DIAGNOSIS — R7881 Bacteremia: Secondary | ICD-10-CM | POA: Diagnosis not present

## 2024-04-23 DIAGNOSIS — Z79899 Other long term (current) drug therapy: Secondary | ICD-10-CM

## 2024-04-23 LAB — COMPREHENSIVE METABOLIC PANEL WITH GFR
ALT: 16 U/L (ref 0–44)
AST: 18 U/L (ref 15–41)
Albumin: 2.6 g/dL — ABNORMAL LOW (ref 3.5–5.0)
Alkaline Phosphatase: 65 U/L (ref 38–126)
Anion gap: 12 (ref 5–15)
BUN: 22 mg/dL (ref 8–23)
CO2: 23 mmol/L (ref 22–32)
Calcium: 8.7 mg/dL — ABNORMAL LOW (ref 8.9–10.3)
Chloride: 100 mmol/L (ref 98–111)
Creatinine, Ser: 0.7 mg/dL (ref 0.61–1.24)
GFR, Estimated: >60 mL/min (ref >60)
Glucose, Bld: 116 mg/dL — ABNORMAL HIGH (ref 70–99)
Potassium: 4.2 mmol/L (ref 3.5–5.1)
Sodium: 135 mmol/L (ref 135–145)
Total Bilirubin: 0.7 mg/dL (ref 0.0–1.2)
Total Protein: 6.5 g/dL (ref 6.5–8.1)

## 2024-04-23 LAB — PROTIME-INR
INR: 1.2 (ref 0.8–1.2)
Prothrombin Time: 16 s — ABNORMAL HIGH (ref 11.4–15.2)

## 2024-04-23 LAB — CREATININE, SERUM
Creatinine, Ser: 0.8 mg/dL (ref 0.61–1.24)
GFR, Estimated: >60 mL/min (ref >60)

## 2024-04-23 LAB — URINALYSIS, W/ REFLEX TO CULTURE (INFECTION SUSPECTED)
Bilirubin Urine: NEGATIVE
Glucose, UA: NEGATIVE mg/dL
Hgb urine dipstick: NEGATIVE
Ketones, ur: NEGATIVE mg/dL
Nitrite: POSITIVE — AB
Protein, ur: NEGATIVE mg/dL
Specific Gravity, Urine: >1.046 — ABNORMAL HIGH (ref 1.005–1.030)
pH: 7 (ref 5.0–8.0)

## 2024-04-23 LAB — CBC WITH DIFFERENTIAL/PLATELET
Abs Immature Granulocytes: 0.1 K/uL — ABNORMAL HIGH (ref 0.00–0.07)
Basophils Absolute: 0.1 K/uL (ref 0.0–0.1)
Basophils Relative: 0 %
Eosinophils Absolute: 0.2 K/uL (ref 0.0–0.5)
Eosinophils Relative: 1 %
HCT: 36.1 % — ABNORMAL LOW (ref 39.0–52.0)
Hemoglobin: 11.4 g/dL — ABNORMAL LOW (ref 13.0–17.0)
Immature Granulocytes: 1 %
Lymphocytes Relative: 3 %
Lymphs Abs: 0.7 K/uL (ref 0.7–4.0)
MCH: 28.7 pg (ref 26.0–34.0)
MCHC: 31.6 g/dL (ref 30.0–36.0)
MCV: 90.9 fL (ref 80.0–100.0)
Monocytes Absolute: 1.5 K/uL — ABNORMAL HIGH (ref 0.1–1.0)
Monocytes Relative: 7 %
Neutro Abs: 18 K/uL — ABNORMAL HIGH (ref 1.7–7.7)
Neutrophils Relative %: 88 %
Platelets: 424 K/uL — ABNORMAL HIGH (ref 150–400)
RBC: 3.97 MIL/uL — ABNORMAL LOW (ref 4.22–5.81)
RDW: 13.7 % (ref 11.5–15.5)
WBC: 20.6 K/uL — ABNORMAL HIGH (ref 4.0–10.5)
nRBC: 0 % (ref 0.0–0.2)

## 2024-04-23 LAB — GLUCOSE, CAPILLARY: Glucose-Capillary: 105 mg/dL — ABNORMAL HIGH (ref 70–99)

## 2024-04-23 LAB — CBC
HCT: 30.4 % — ABNORMAL LOW (ref 39.0–52.0)
Hemoglobin: 9.5 g/dL — ABNORMAL LOW (ref 13.0–17.0)
MCH: 28.8 pg (ref 26.0–34.0)
MCHC: 31.3 g/dL (ref 30.0–36.0)
MCV: 92.1 fL (ref 80.0–100.0)
Platelets: 357 K/uL (ref 150–400)
RBC: 3.3 MIL/uL — ABNORMAL LOW (ref 4.22–5.81)
RDW: 13.7 % (ref 11.5–15.5)
WBC: 17.3 K/uL — ABNORMAL HIGH (ref 4.0–10.5)
nRBC: 0 % (ref 0.0–0.2)

## 2024-04-23 LAB — I-STAT CG4 LACTIC ACID, ED
Lactic Acid, Venous: 2.5 mmol/L (ref 0.5–1.9)
Lactic Acid, Venous: 2.4 mmol/L (ref 0.5–1.9)

## 2024-04-23 LAB — RESP PANEL BY RT-PCR (RSV, FLU A&B, COVID)  RVPGX2
Influenza A by PCR: NEGATIVE
Influenza B by PCR: NEGATIVE
Resp Syncytial Virus by PCR: NEGATIVE
SARS Coronavirus 2 by RT PCR: NEGATIVE

## 2024-04-23 LAB — CBG MONITORING, ED: Glucose-Capillary: 113 mg/dL — ABNORMAL HIGH (ref 70–99)

## 2024-04-23 MED ORDER — VANCOMYCIN HCL 1750 MG/350ML IV SOLN
1750.0000 mg | Freq: Once | INTRAVENOUS | Status: AC
  Administered 2024-04-23: 1750 mg via INTRAVENOUS
  Filled 2024-04-23: qty 350

## 2024-04-23 MED ORDER — LITHIUM CARBONATE 150 MG PO CAPS
150.0000 mg | ORAL_CAPSULE | Freq: Every evening | ORAL | Status: DC
  Administered 2024-04-23 – 2024-04-29 (×7): 150 mg via ORAL
  Filled 2024-04-23 (×8): qty 1

## 2024-04-23 MED ORDER — SODIUM CHLORIDE 0.9 % IV SOLN: INTRAVENOUS | Status: AC

## 2024-04-23 MED ORDER — LACTATED RINGERS IV BOLUS (SEPSIS)
1000.0000 mL | Freq: Once | INTRAVENOUS | Status: AC
  Administered 2024-04-23: 1000 mL via INTRAVENOUS

## 2024-04-23 MED ORDER — METRONIDAZOLE 500 MG/100ML IV SOLN
500.0000 mg | Freq: Once | INTRAVENOUS | Status: AC
  Administered 2024-04-23: 500 mg via INTRAVENOUS
  Filled 2024-04-23: qty 100

## 2024-04-23 MED ORDER — VANCOMYCIN HCL 1250 MG/250ML IV SOLN
1250.0000 mg | Freq: Two times a day (BID) | INTRAVENOUS | Status: DC
  Administered 2024-04-24: 1250 mg via INTRAVENOUS
  Filled 2024-04-23 (×2): qty 250

## 2024-04-23 MED ORDER — IOHEXOL 350 MG/ML SOLN
75.0000 mL | Freq: Once | INTRAVENOUS | Status: AC | PRN
  Administered 2024-04-23: 75 mL via INTRAVENOUS

## 2024-04-23 MED ORDER — ONDANSETRON HCL 4 MG PO TABS: 4.0000 mg | ORAL_TABLET | Freq: Four times a day (QID) | ORAL | Status: DC | PRN

## 2024-04-23 MED ORDER — LACTATED RINGERS IV SOLN: INTRAVENOUS | Status: DC

## 2024-04-23 MED ORDER — BUPROPION HCL ER (XL) 150 MG PO TB24
300.0000 mg | ORAL_TABLET | Freq: Every day | ORAL | Status: DC
  Administered 2024-04-24 – 2024-04-30 (×7): 300 mg via ORAL
  Filled 2024-04-23 (×7): qty 2

## 2024-04-23 MED ORDER — VANCOMYCIN HCL IN DEXTROSE 1-5 GM/200ML-% IV SOLN: 1000.0000 mg | Freq: Once | INTRAVENOUS | Status: DC

## 2024-04-23 MED ORDER — NEBIVOLOL HCL 2.5 MG PO TABS
2.5000 mg | ORAL_TABLET | Freq: Every day | ORAL | Status: DC
  Administered 2024-04-24 – 2024-04-30 (×7): 2.5 mg via ORAL
  Filled 2024-04-23 (×7): qty 1

## 2024-04-23 MED ORDER — CHLORHEXIDINE GLUCONATE CLOTH 2 % EX PADS
6.0000 | MEDICATED_PAD | Freq: Every day | CUTANEOUS | Status: DC
  Administered 2024-04-24 – 2024-04-30 (×7): 6 via TOPICAL

## 2024-04-23 MED ORDER — MORPHINE SULFATE (PF) 4 MG/ML IV SOLN
4.0000 mg | Freq: Once | INTRAVENOUS | Status: AC
  Administered 2024-04-23: 4 mg via INTRAVENOUS
  Filled 2024-04-23: qty 1

## 2024-04-23 MED ORDER — CARBIDOPA-LEVODOPA 25-100 MG PO TABS
1.0000 | ORAL_TABLET | Freq: Three times a day (TID) | ORAL | Status: DC
  Administered 2024-04-23 – 2024-04-30 (×20): 1 via ORAL
  Filled 2024-04-23 (×20): qty 1

## 2024-04-23 MED ORDER — IPRATROPIUM-ALBUTEROL 0.5-2.5 (3) MG/3ML IN SOLN: 3.0000 mL | RESPIRATORY_TRACT | Status: DC | PRN

## 2024-04-23 MED ORDER — PIPERACILLIN-TAZOBACTAM 3.375 G IVPB
3.3750 g | Freq: Three times a day (TID) | INTRAVENOUS | Status: DC
  Administered 2024-04-23 – 2024-04-24 (×2): 3.375 g via INTRAVENOUS
  Filled 2024-04-23 (×2): qty 50

## 2024-04-23 MED ORDER — NICOTINE 21 MG/24HR TD PT24: 21.0000 mg | MEDICATED_PATCH | Freq: Every day | TRANSDERMAL | Status: DC | PRN

## 2024-04-23 MED ORDER — SODIUM CHLORIDE 0.9 % IV SOLN
2.0000 g | Freq: Once | INTRAVENOUS | Status: AC
  Administered 2024-04-23: 2 g via INTRAVENOUS
  Filled 2024-04-23: qty 12.5

## 2024-04-23 MED ORDER — GLUCAGON HCL RDNA (DIAGNOSTIC) 1 MG IJ SOLR: 1.0000 mg | INTRAMUSCULAR | Status: DC | PRN

## 2024-04-23 MED ORDER — OXYCODONE HCL 5 MG PO TABS
5.0000 mg | ORAL_TABLET | ORAL | Status: DC | PRN
  Administered 2024-04-23 – 2024-04-29 (×15): 5 mg via ORAL
  Filled 2024-04-23 (×15): qty 1

## 2024-04-23 MED ORDER — ACETAMINOPHEN 325 MG PO TABS
650.0000 mg | ORAL_TABLET | Freq: Four times a day (QID) | ORAL | Status: DC | PRN
  Administered 2024-04-23 – 2024-04-27 (×5): 650 mg via ORAL
  Filled 2024-04-23 (×6): qty 2

## 2024-04-23 MED ORDER — FLEET ENEMA RE ENEM: 1.0000 | ENEMA | Freq: Once | RECTAL | Status: DC | PRN

## 2024-04-23 MED ORDER — ENOXAPARIN SODIUM 40 MG/0.4ML IJ SOSY
40.0000 mg | PREFILLED_SYRINGE | INTRAMUSCULAR | Status: DC
  Administered 2024-04-23 – 2024-04-28 (×6): 40 mg via SUBCUTANEOUS
  Filled 2024-04-23 (×6): qty 0.4

## 2024-04-23 MED ORDER — SENNOSIDES-DOCUSATE SODIUM 8.6-50 MG PO TABS: 1.0000 | ORAL_TABLET | Freq: Every evening | ORAL | Status: DC | PRN

## 2024-04-23 MED ORDER — HYDRALAZINE HCL 20 MG/ML IJ SOLN: 10.0000 mg | INTRAMUSCULAR | Status: DC | PRN

## 2024-04-23 MED ORDER — ONDANSETRON HCL 4 MG/2ML IJ SOLN: 4.0000 mg | Freq: Four times a day (QID) | INTRAMUSCULAR | Status: DC | PRN

## 2024-04-23 MED ORDER — BISACODYL 5 MG PO TBEC
5.0000 mg | DELAYED_RELEASE_TABLET | Freq: Every day | ORAL | Status: DC | PRN
Start: 2024-04-23 — End: 2024-04-30

## 2024-04-23 MED ORDER — ACETAMINOPHEN 650 MG RE SUPP: 650.0000 mg | Freq: Four times a day (QID) | RECTAL | Status: DC | PRN

## 2024-04-23 MED ORDER — GADOBUTROL 1 MMOL/ML IV SOLN
9.0000 mL | Freq: Once | INTRAVENOUS | Status: AC | PRN
  Administered 2024-04-23: 9 mL via INTRAVENOUS

## 2024-04-23 MED ORDER — METOPROLOL TARTRATE 5 MG/5ML IV SOLN: 5.0000 mg | INTRAVENOUS | Status: DC | PRN

## 2024-04-23 MED ORDER — TAMSULOSIN HCL 0.4 MG PO CAPS
0.4000 mg | ORAL_CAPSULE | Freq: Every day | ORAL | Status: DC
  Administered 2024-04-23 – 2024-04-29 (×7): 0.4 mg via ORAL
  Filled 2024-04-23 (×7): qty 1

## 2024-04-23 MED ORDER — MIRTAZAPINE 15 MG PO TABS
15.0000 mg | ORAL_TABLET | Freq: Every day | ORAL | Status: DC
Start: 2024-04-23 — End: 2024-04-30
  Administered 2024-04-23 – 2024-04-29 (×7): 15 mg via ORAL
  Filled 2024-04-23 (×7): qty 1

## 2024-04-23 MED ORDER — PANTOPRAZOLE SODIUM 40 MG PO TBEC
40.0000 mg | DELAYED_RELEASE_TABLET | Freq: Every day | ORAL | Status: DC
  Administered 2024-04-24 – 2024-04-30 (×7): 40 mg via ORAL
  Filled 2024-04-23 (×7): qty 1

## 2024-04-23 MED ORDER — CLONAZEPAM 0.5 MG PO TABS
0.5000 mg | ORAL_TABLET | Freq: Two times a day (BID) | ORAL | Status: DC | PRN
  Administered 2024-04-24: 0.5 mg via ORAL
  Filled 2024-04-23: qty 1

## 2024-04-23 MED ORDER — INSULIN ASPART 100 UNIT/ML IJ SOLN
0.0000 [IU] | Freq: Three times a day (TID) | INTRAMUSCULAR | Status: DC
  Administered 2024-04-25: 3 [IU] via SUBCUTANEOUS
  Administered 2024-04-29 (×3): 2 [IU] via SUBCUTANEOUS

## 2024-04-23 MED ORDER — HYDROMORPHONE HCL 1 MG/ML IJ SOLN
0.5000 mg | INTRAMUSCULAR | Status: DC | PRN
  Administered 2024-04-23 – 2024-04-26 (×5): 1 mg via INTRAVENOUS
  Filled 2024-04-23 (×5): qty 1

## 2024-04-23 MED ORDER — TEMAZEPAM 15 MG PO CAPS
15.0000 mg | ORAL_CAPSULE | Freq: Every evening | ORAL | Status: DC | PRN
  Administered 2024-04-28: 15 mg via ORAL
  Filled 2024-04-23: qty 1

## 2024-04-23 NOTE — H&P (Signed)
 History and Physical    Douglas Price FMW:[id number] DOB: 1951-01-13 DOA: 04/23/2024  PCP: Janey Santos, MD Patient coming from: [place]  Chief Complaint: Fever  HPI: Douglas Price is a 73 y.o. male with medical history significant of depression, Parkinson's disease, DM 2, GERD, RLS, chronic sacral wound, insomnia, chronic urinary retention with Foley in place comes to the hospital for evaluation of fever.  Patient states for the past few days he has noted some lower back pain and possible drainage and enlargement of his sacral wound but earlier today started having fever as high as 103.5 therefore comes to the hospital.  He had Foley changed at [facility] urology on 04/20/2024. In the ER patient was noted to have sepsis likely secondary toLower back draining wound.  He was noted to have mild lactic acidosis, leukocytosis.  CT scan showed skin ulceration without any evidence of deep abscess/fluid collection or osteomyelitis.  Started on IV vancomycin , cefepime  and Flagyl .  Medicine team requested to admit the patient   Review of Systems: As per HPI otherwise 10 point review of systems negative.  Review of Systems Otherwise negative except as per HPI, including: General: Denies unintended weight loss. Resp: Denies cough, wheezing, shortness of breath. Cardiac: Denies chest pain, palpitations, orthopnea, paroxysmal nocturnal dyspnea. GI: Denies abdominal pain, nausea, vomiting, diarrhea or constipation GU: Denies dysuria, frequency, hesitancy or incontinence MS: Denies muscle aches, joint pain or swelling Neuro: Denies headache, neurologic deficits (focal weakness, numbness, tingling), abnormal gait Psych: Denies anxiety, depression, SI/HI/AVH Skin: Lower back sacral ulcer ID: Denies sick contacts, exotic exposures, travel  Past Medical History:  Diagnosis Date   Allergic rhinitis due to pollen    Benign neoplasm of colon    Blisters with epidermal loss due to burn (second  degree) of foot    Complication of anesthesia    pt woke up during saphenous vein surgery-had epidural   Constipation due to pain medication    Degeneration of lumbar or lumbosacral intervertebral disc    Diverticulosis of colon (without mention of hemorrhage)    DM (diabetes mellitus) (HCC)    Esophageal reflux    Hypertrophy of prostate with urinary obstruction and other lower urinary tract symptoms (LUTS)    Loss of weight    Neuromuscular disorder (HCC)    peripheral neuropathy   Obesity, unspecified    Pain in limb    Restless legs syndrome (RLS)    Sleep disturbance 03/24/2014   Spinal stenosis, unspecified region other than cervical    Tobacco use disorder    Unspecified disease of pericardium    Unspecified essential hypertension    Unspecified local infection of skin and subcutaneous tissue    Varices of other sites     Past Surgical History:  Procedure Laterality Date   BACK SURGERY     CATARACT EXTRACTION Bilateral    one in july and other in Aug.2023   COLONOSCOPY W/ POLYPECTOMY     CYST EXCISION Left 04/09/2022   Procedure: LEFT INDEX FINGER MUCOID CYST EXCISION;  Surgeon: Murrell Drivers, MD;  Location: [facility];  Service: Orthopedics;  Laterality: Left;  Bier block   CYST EXCISION Left 05/02/2023   Procedure: LEFT INDEX FINGER MUCOID CYST EXCISION AND DISTAL INTERPHALANGEAL JOINT DEBRIDEMENT;  Surgeon: Murrell Drivers, MD;  Location: [facility];  Service: Orthopedics;  Laterality: Left;   LUMBAR LAMINECTOMY/DECOMPRESSION MICRODISCECTOMY Bilateral 09/08/2015   Procedure: Laminectomy and Foraminotomy - Lumbar two-lumbar three bilateral;  Surgeon: Alm GORMAN Molt,  MD;  Location: [facility];  Service: Neurosurgery;  Laterality: Bilateral;   SAPHENOUS VEIN GRAFT RESECTION Right 09/24/1989   TENDON EXPLORATION Left 04/09/2022   Procedure: DEBRIDEMENT DISTAL INTERPHALANGEAL JOINT LEFT INDEX FINGER;  Surgeon: Murrell Drivers, MD;  Location: [facility];  Service: Orthopedics;  Laterality: Left;  Bier block   TONSILLECTOMY     TRIGGER FINGER RELEASE Left 05/02/2023   Procedure: LEFT RING FINGER TRIGGER RELEASE;  Surgeon: Murrell Drivers, MD;  Location: [facility];  Service: Orthopedics;  Laterality: Left;    SOCIAL HISTORY:  reports that he has been smoking pipe. He has never used smokeless tobacco. He reports that he does not drink alcohol and does not use drugs.  Allergies  Allergen Reactions   Avelox [Moxifloxacin] Other (See Comments)    Angioedema Fatigue   Quinolones Other (See Comments)    Angioedema   Lexapro [Escitalopram] Other (See Comments)    unknown    FAMILY HISTORY: Family History  Problem Relation Age of Onset   Heart failure Mother    Lung cancer Father    Neuropathy Brother    Hypertension Paternal Grandfather    Tuberculosis Maternal Uncle    Cancer - Colon Maternal Uncle      Prior to Admission medications   Medication Sig Start Date End Date Taking? Authorizing Provider  buPROPion  (WELLBUTRIN  XL) 300 MG 24 hr tablet TAKE 1 TABLET(300 MG) BY MOUTH DAILY 02/18/24   Rhys Boyer T, PA-C  lithium  carbonate 150 MG capsule TAKE 1 CAPSULE(150 MG) BY MOUTH EVERY EVENING 02/18/24   Hurst, Boyer DASEN, PA-C  amoxicillin -clavulanate (AUGMENTIN ) 875-125 MG tablet Take 1 tablet by mouth every 12 (twelve) hours. 03/19/24   Francesca Elsie CROME, MD  carbidopa -levodopa  (SINEMET  IR) 25-100 MG tablet Take 1 tablet by mouth 3 (three) times daily. 02/27/24   Patsy Lenis, MD  clonazePAM  (KLONOPIN ) 0.5 MG tablet Take 1 tablet (0.5 mg total) by mouth 2 (two) times daily as needed for anxiety. 02/27/24   Patsy Lenis, MD  DULoxetine  (CYMBALTA ) 60 MG capsule Take 120 mg by mouth daily.     [provider]  dutasteride  (AVODART ) 0.5 MG capsule Take 0.5 mg by mouth daily.    [provider]  HYDROcodone -acetaminophen  (NORCO/VICODIN) 5-325 MG tablet Take 1 tablet by mouth every 6 (six) hours  as needed for moderate pain (pain score 4-6). 1-2 tabs PO q6 hours prn pain 04/03/24   Lason Eveland, Saad, MD  loratadine (CLARITIN) 10 MG tablet Take 10 mg by mouth daily as needed for allergies, rhinitis or itching.    [provider]  metFORMIN  (GLUCOPHAGE ) 500 MG tablet Take 1,000 mg by mouth 2 (two) times daily with a meal.    [provider]  mirtazapine  (REMERON ) 15 MG tablet Take 15 mg by mouth at bedtime.    [provider]  nebivolol  (BYSTOLIC ) 2.5 MG tablet Take 2.5 mg by mouth daily.    [provider]  omeprazole (PRILOSEC) 20 MG capsule Take 20 mg by mouth 2 (two) times daily before a meal.     [provider]  pregabalin  (LYRICA ) 75 MG capsule Take 1 capsule (75 mg total) by mouth 2 (two) times daily. 02/27/24   Patsy Lenis, MD  tamsulosin  (FLOMAX ) 0.4 MG CAPS capsule Take 0.4 mg by mouth daily.    [provider]  telmisartan  (MICARDIS ) 40 MG tablet Take 40 mg by mouth daily.    [provider]  temazepam  (RESTORIL ) 15 MG  capsule Take 1 capsule (15 mg total) by mouth at bedtime as needed for sleep. 02/27/24   Patsy Lenis, MD    Physical Exam: Vitals:   04/23/24 1444 04/23/24 1515  BP: (!) 141/78 129/80  Pulse: (!) 109 (!) 106  Resp: 16 19  Temp: 99.5 F (37.5 C)   TempSrc: Oral   SpO2: 96% 93%      Constitutional: NAD, calm, comfortable Eyes: PERRL, lids and conjunctivae normal ENMT: Mucous membranes are moist. Posterior pharynx clear of any exudate or lesions.Normal dentition.  Neck: normal, supple, no masses, no thyromegaly Respiratory: clear to auscultation bilaterally, no wheezing, no crackles. Normal respiratory effort. No accessory muscle use.  Cardiovascular: Regular rate and rhythm, no murmurs / rubs / gallops. No extremity edema. 2+ pedal pulses. No carotid bruits.  Abdomen: no tenderness, no masses palpated. No hepatosplenomegaly. Bowel sounds positive.  Musculoskeletal: no clubbing / cyanosis. No joint  deformity upper and lower extremities. Good ROM, no contractures. Normal muscle tone.  Skin: Sacral ulcer with open wound Neurologic: CN 2-12 grossly intact. Sensation intact, DTR normal. Strength 5/5 in all 4.  Psychiatric: Normal judgment and insight. Alert and oriented x 3. Normal mood.  Chronic Foley in place  There is no height or weight on file to calculate BMI.      Labs on Admission: I have personally reviewed following labs and imaging studies  CBC: Recent Labs  Lab 04/23/24 1532  WBC 20.6*  NEUTROABS 18.0*  HGB 11.4*  HCT 36.1*  MCV 90.9  PLT 424*   Basic Metabolic Panel: Recent Labs  Lab 04/23/24 1532  NA 135  K 4.2  CL 100  CO2 23  GLUCOSE 116*  BUN 22  CREATININE 0.70  CALCIUM 8.7*   GFR: CrCl cannot be calculated (Unknown ideal weight.). Liver Function Tests: Recent Labs  Lab 04/23/24 1532  AST 18  ALT 16  ALKPHOS 65  BILITOT 0.7  PROT 6.5  ALBUMIN  2.6*   No results for input(s): LIPASE, AMYLASE in the last 168 hours. No results for input(s): AMMONIA in the last 168 hours. Coagulation Profile: Recent Labs  Lab 04/23/24 1532  INR 1.2   Cardiac Enzymes: No results for input(s): CKTOTAL, CKMB, CKMBINDEX, TROPONINI in the last 168 hours. BNP (last 3 results) No results for input(s): PROBNP in the last 8760 hours. HbA1C: No results for input(s): HGBA1C in the last 72 hours. CBG: Recent Labs  Lab 04/23/24 1446  GLUCAP 113*   Lipid Profile: No results for input(s): CHOL, HDL, LDLCALC, TRIG, CHOLHDL, LDLDIRECT in the last 72 hours. Thyroid Function Tests: No results for input(s): TSH, T4TOTAL, FREET4, T3FREE, THYROIDAB in the last 72 hours. Anemia Panel: No results for input(s): VITAMINB12, FOLATE, FERRITIN, TIBC, IRON, RETICCTPCT in the last 72 hours. Urine analysis:    Component Value Date/Time   COLORURINE YELLOW 02/19/2024 0809   APPEARANCEUR CLEAR 02/19/2024 0809   LABSPEC  1.021 02/19/2024 0809   PHURINE 5.0 02/19/2024 0809   GLUCOSEU 150 (A) 02/19/2024 0809   HGBUR LARGE (A) 02/19/2024 0809   BILIRUBINUR NEGATIVE 02/19/2024 0809   KETONESUR NEGATIVE 02/19/2024 0809   PROTEINUR 30 (A) 02/19/2024 0809   UROBILINOGEN 0.2 06/03/2013 1559   NITRITE NEGATIVE 02/19/2024 0809   LEUKOCYTESUR NEGATIVE 02/19/2024 0809   Sepsis Labs: !!!!!!!!!!!!!!!!!!!!!!!!!!!!!!!!!!!!!!!!!!!! @LABRCNTIP (procalcitonin:4,lacticidven:4) )No results found for this or any previous visit (from the past 240 hours).   Radiological Exams on Admission: CT ABDOMEN PELVIS W CONTRAST Result Date: 04/23/2024 CLINICAL DATA:  Sepsis.  Worsening sacral  wound. EXAM: CT ABDOMEN AND PELVIS WITH CONTRAST TECHNIQUE: Multidetector CT imaging of the abdomen and pelvis was performed using the standard protocol following bolus administration of intravenous contrast. RADIATION DOSE REDUCTION: This exam was performed according to the departmental dose-optimization program which includes automated exposure control, adjustment of the mA and/or kV according to patient size and/or use of iterative reconstruction technique. CONTRAST:  75mL OMNIPAQUE  IOHEXOL  350 MG/ML SOLN COMPARISON:  CT abdomen pelvis dated 03/19/2024. FINDINGS: Lower chest: No acute abnormality. No intra-abdominal free air or free fluid. Hepatobiliary: Small liver cysts. No biliary dilatation. The gallbladder is unremarkable Pancreas: Unremarkable. No pancreatic ductal dilatation or surrounding inflammatory changes. Spleen: Normal in size without focal abnormality. Adrenals/Urinary Tract: The adrenal glands unremarkable. Small bilateral renal cysts. There is no hydronephrosis on either side. There is symmetric enhancement and excretion of contrast by both kidneys. The visualized ureters appear unremarkable. The urinary bladder is decompressed around a Foley catheter. Stomach/Bowel: Moderate stool throughout the colon. There is no bowel obstruction or active  inflammation. The appendix is not visualized with certainty. No inflammatory changes identified in the right lower quadrant. Vascular/Lymphatic: Mild aortoiliac atherosclerotic disease. The IVC is unremarkable. No portal venous gas. There is no adenopathy. Reproductive: The prostate and seminal vesicles are grossly unremarkable. No pelvic mass. Other: Small fat containing right inguinal hernia. Interval development of a skin ulcer over the distal sacrum extending deep into the soft tissues to the level of the bone. No definite bone erosion or evidence of acute osteomyelitis by CT. No fluid collection or abscess. Musculoskeletal: Osteopenia with degenerative changes of the spine. Multilevel lumbar fusion. No acute osseous pathology. IMPRESSION: 1. Interval development of a skin ulcer over the distal sacrum. No evidence of acute osteomyelitis by CT. No fluid collection or abscess. 2. No bowel obstruction. 3.  Aortic Atherosclerosis (ICD10-I70.0). Electronically Signed   By: Vanetta Chou M.D.   On: 04/23/2024 16:49   DG Chest Port 1 View Result Date: 04/23/2024 CLINICAL DATA:  Questionable sepsis - evaluate for abnormality EXAM: PORTABLE CHEST - 1 VIEW COMPARISON:  February 25, 2024 FINDINGS: Elevation of the right hemidiaphragm. No focal airspace consolidation, pleural effusion, or pneumothorax. No cardiomegaly. No acute fracture or destructive lesion. Multilevel degenerative disc disease of the spine. IMPRESSION: No acute cardiopulmonary abnormality. Electronically Signed   By: Rogelia Myers M.D.   On: 04/23/2024 16:06    Nutritional status  All images have been reviewed by me personally.    Assessment/Plan Principal Problem:   Sepsis due to cellulitis [facility]) Active Problems:   Parkinson's disease (HCC)   Multiple wounds of skin   Diabetic polyneuropathy associated with type 2 diabetes mellitus (HCC)   Depression   S/P lumbar laminectomy   Severe sepsis secondary to sacral cellulitis - Cultures  have been drawn, monitor culture data.  For now we will continue broad-spectrum vancomycin  and Zosyn . -Wound care team consulted for dressing. -CT abdomen pelvis does not show any deep infection/fluid collection but does show skin ulceration - Will order MRI lumbar and sacral spine to rule out osteomyelitis as there is evidence of likely exposed bone. -Gentle fluids  History of Parkinson disease Dysphagia - Will resume home meds once confirmed preadmit MedRec -Reportedly on regular diet  Chronic urinary retention - Chronically wears Foley catheter.  Last changed by urology 7/28.  Will send off UA but will continue to monitor.  Prior to discharge we will attempt to change his Foley catheter again. -Flomax   Diabetes mellitus type 2 Diabetic peripheral  neuropathy - Sliding scale and Accu-Chek.  Awaiting preadmission med rec to be finalized  GERD - PPI  Depression/anxiety - Continue home medications  Insomnia - Continue home medications  Essential hypertension - Resume home meds    DVT prophylaxis: Lovenox  Code Status: Full code Family Communication: Sister at bedside Consults called: Wound care Admission status: Inpatient admission for severe sepsis  Status is: Inpatient Remains inpatient appropriate because: Severe sepsis   Time Spent: 65 minutes.  >50% of the time was devoted to discussing the patients care, assessment, plan and disposition with other care givers along with counseling the patient about the risks and benefits of treatment.    Burgess JAYSON Dare MD [facility]  If 7PM-7AM, please contact night-coverage   04/23/2024, 4:58 PM

## 2024-04-23 NOTE — ED Provider Notes (Signed)
 [place] EMERGENCY DEPARTMENT AT [facility] Provider Note   CSN: [id number] Arrival date & time: 04/23/24  1441     Patient presents with: Fever   Douglas Price is a 73 y.o. male.   The history is provided by the patient, the spouse and medical records. No language interpreter was used.  Fever Max temp prior to arrival:  103.5 Temp source:  Oral Severity:  Severe Onset quality:  Gradual Duration:  2 days Timing:  Constant Progression:  Waxing and waning Chronicity:  New Relieved by:  Nothing Ineffective treatments:  None tried Associated symptoms: chills   Associated symptoms: no chest pain, no confusion, no congestion, no cough, no diarrhea, no dysuria, no headaches, no nausea, no rash and no vomiting        Prior to Admission medications   Medication Sig Start Date End Date Taking? Authorizing Provider  buPROPion  (WELLBUTRIN  XL) 300 MG 24 hr tablet TAKE 1 TABLET(300 MG) BY MOUTH DAILY 02/18/24   Rhys Boyer T, PA-C  lithium  carbonate 150 MG capsule TAKE 1 CAPSULE(150 MG) BY MOUTH EVERY EVENING 02/18/24   Hurst, Boyer T, PA-C  amoxicillin -clavulanate (AUGMENTIN ) 875-125 MG tablet Take 1 tablet by mouth every 12 (twelve) hours. 03/19/24   Francesca Elsie CROME, MD  carbidopa -levodopa  (SINEMET  IR) 25-100 MG tablet Take 1 tablet by mouth 3 (three) times daily. 02/27/24   Patsy Lenis, MD  clonazePAM  (KLONOPIN ) 0.5 MG tablet Take 1 tablet (0.5 mg total) by mouth 2 (two) times daily as needed for anxiety. 02/27/24   Patsy Lenis, MD  DULoxetine  (CYMBALTA ) 60 MG capsule Take 120 mg by mouth daily.     [provider]  dutasteride  (AVODART ) 0.5 MG capsule Take 0.5 mg by mouth daily.    [provider]  HYDROcodone -acetaminophen  (NORCO/VICODIN) 5-325 MG tablet Take 1 tablet by mouth every 6 (six) hours as needed for moderate pain (pain score 4-6). 1-2 tabs PO q6 hours prn pain 04/03/24   Amin, Saad, MD  loratadine (CLARITIN) 10 MG tablet Take 10 mg by  mouth daily as needed for allergies, rhinitis or itching.    [provider]  metFORMIN  (GLUCOPHAGE ) 500 MG tablet Take 1,000 mg by mouth 2 (two) times daily with a meal.    [provider]  mirtazapine  (REMERON ) 15 MG tablet Take 15 mg by mouth at bedtime.    [provider]  nebivolol  (BYSTOLIC ) 2.5 MG tablet Take 2.5 mg by mouth daily.    [provider]  omeprazole (PRILOSEC) 20 MG capsule Take 20 mg by mouth 2 (two) times daily before a meal.     [provider]  pregabalin  (LYRICA ) 75 MG capsule Take 1 capsule (75 mg total) by mouth 2 (two) times daily. 02/27/24   Patsy Lenis, MD  tamsulosin  (FLOMAX ) 0.4 MG CAPS capsule Take 0.4 mg by mouth daily.    [provider]  telmisartan  (MICARDIS ) 40 MG tablet Take 40 mg by mouth daily.    [provider]  temazepam  (RESTORIL ) 15 MG capsule Take 1 capsule (15 mg total) by mouth at bedtime as needed for sleep. 02/27/24   Patsy Lenis, MD    Allergies: Avelox [moxifloxacin], Quinolones, and Lexapro [escitalopram]    Review of Systems  Constitutional:  Positive for chills, fatigue and fever. Negative for diaphoresis.  HENT:  Negative for congestion.   Respiratory:  Negative for cough, chest tightness, shortness of breath and wheezing.   Cardiovascular:  Negative for chest pain and palpitations.  Gastrointestinal:  Positive for constipation. Negative for abdominal pain, diarrhea, nausea and vomiting.  Genitourinary:  Negative for dysuria and flank pain.  Musculoskeletal:  Positive for back pain. Negative for neck pain and neck stiffness.  Skin:  Positive for wound. Negative for rash.  Neurological:  Negative for dizziness, light-headedness and headaches.  Psychiatric/Behavioral:  Negative for confusion.   All other systems reviewed and are negative.   Updated Vital Signs BP (!) 141/78 Comment: Simultaneous filing. User may not have seen previous data.  Pulse (!) 109 Comment:  Simultaneous filing. User may not have seen previous data.  Temp 99.5 F (37.5 C) (Oral) Comment: Simultaneous filing. User may not have seen previous data. Comment (Src): Simultaneous filing. User may not have seen previous data.  Resp 16 Comment: Simultaneous filing. User may not have seen previous data.  SpO2 96% Comment: Simultaneous filing. User may not have seen previous data.  Physical Exam Vitals and nursing note reviewed.  Constitutional:      General: He is not in acute distress.    Appearance: He is well-developed. He is not ill-appearing, toxic-appearing or diaphoretic.  HENT:     Head: Normocephalic and atraumatic.     Nose: No congestion or rhinorrhea.     Mouth/Throat:     Mouth: Mucous membranes are moist.     Pharynx: No oropharyngeal exudate or posterior oropharyngeal erythema.  Eyes:     Extraocular Movements: Extraocular movements intact.     Conjunctiva/sclera: Conjunctivae normal.     Pupils: Pupils are equal, round, and reactive to light.  Cardiovascular:     Rate and Rhythm: Normal rate and regular rhythm.     Heart sounds: No murmur heard. Pulmonary:     Effort: Pulmonary effort is normal. No respiratory distress.     Breath sounds: Normal breath sounds. No wheezing, rhonchi or rales.  Chest:     Chest wall: No tenderness.  Abdominal:     General: Abdomen is flat.     Palpations: Abdomen is soft.     Tenderness: There is no abdominal tenderness. There is no guarding or rebound.  Musculoskeletal:        General: Tenderness present. No swelling.     Cervical back: Neck supple. No tenderness.  Skin:    General: Skin is warm and dry.     Capillary Refill: Capillary refill takes less than 2 seconds.     Findings: Erythema present.  Neurological:     General: No focal deficit present.     Mental Status: He is alert.     Sensory: No sensory deficit.     Motor: No weakness.  Psychiatric:        Mood and Affect: Mood normal.      (all labs ordered  are listed, but only abnormal results are displayed) Labs Reviewed  COMPREHENSIVE METABOLIC PANEL WITH GFR - Abnormal; Notable for the following components:      Result Value   Glucose, Bld 116 (*)    Calcium 8.7 (*)    Albumin  2.6 (*)    All other components within normal limits  CBC WITH DIFFERENTIAL/PLATELET - Abnormal; Notable for the following components:   WBC 20.6 (*)    RBC 3.97 (*)    Hemoglobin 11.4 (*)    HCT 36.1 (*)    Platelets 424 (*)    Neutro Abs 18.0 (*)    Monocytes Absolute 1.5 (*)    Abs Immature Granulocytes 0.10 (*)    All other components  within normal limits  PROTIME-INR - Abnormal; Notable for the following components:   Prothrombin Time 16.0 (*)    All other components within normal limits  CBG MONITORING, ED - Abnormal; Notable for the following components:   Glucose-Capillary 113 (*)    All other components within normal limits  I-STAT CG4 LACTIC ACID, ED - Abnormal; Notable for the following components:   Lactic Acid, Venous 2.5 (*)    All other components within normal limits  RESP PANEL BY RT-PCR (RSV, FLU A&B, COVID)  RVPGX2  CULTURE, BLOOD (ROUTINE X 2)  CULTURE, BLOOD (ROUTINE X 2)  URINALYSIS, W/ REFLEX TO CULTURE (INFECTION SUSPECTED)  CBC  CREATININE, SERUM  I-STAT CG4 LACTIC ACID, ED    EKG: None  Radiology: CT ABDOMEN PELVIS W CONTRAST Result Date: 04/23/2024 CLINICAL DATA:  Sepsis.  Worsening sacral wound. EXAM: CT ABDOMEN AND PELVIS WITH CONTRAST TECHNIQUE: Multidetector CT imaging of the abdomen and pelvis was performed using the standard protocol following bolus administration of intravenous contrast. RADIATION DOSE REDUCTION: This exam was performed according to the departmental dose-optimization program which includes automated exposure control, adjustment of the mA and/or kV according to patient size and/or use of iterative reconstruction technique. CONTRAST:  75mL OMNIPAQUE  IOHEXOL  350 MG/ML SOLN COMPARISON:  CT abdomen pelvis  dated 03/19/2024. FINDINGS: Lower chest: No acute abnormality. No intra-abdominal free air or free fluid. Hepatobiliary: Small liver cysts. No biliary dilatation. The gallbladder is unremarkable Pancreas: Unremarkable. No pancreatic ductal dilatation or surrounding inflammatory changes. Spleen: Normal in size without focal abnormality. Adrenals/Urinary Tract: The adrenal glands unremarkable. Small bilateral renal cysts. There is no hydronephrosis on either side. There is symmetric enhancement and excretion of contrast by both kidneys. The visualized ureters appear unremarkable. The urinary bladder is decompressed around a Foley catheter. Stomach/Bowel: Moderate stool throughout the colon. There is no bowel obstruction or active inflammation. The appendix is not visualized with certainty. No inflammatory changes identified in the right lower quadrant. Vascular/Lymphatic: Mild aortoiliac atherosclerotic disease. The IVC is unremarkable. No portal venous gas. There is no adenopathy. Reproductive: The prostate and seminal vesicles are grossly unremarkable. No pelvic mass. Other: Small fat containing right inguinal hernia. Interval development of a skin ulcer over the distal sacrum extending deep into the soft tissues to the level of the bone. No definite bone erosion or evidence of acute osteomyelitis by CT. No fluid collection or abscess. Musculoskeletal: Osteopenia with degenerative changes of the spine. Multilevel lumbar fusion. No acute osseous pathology. IMPRESSION: 1. Interval development of a skin ulcer over the distal sacrum. No evidence of acute osteomyelitis by CT. No fluid collection or abscess. 2. No bowel obstruction. 3.  Aortic Atherosclerosis (ICD10-I70.0). Electronically Signed   By: Vanetta Chou M.D.   On: 04/23/2024 16:49   DG Chest Port 1 View Result Date: 04/23/2024 CLINICAL DATA:  Questionable sepsis - evaluate for abnormality EXAM: PORTABLE CHEST - 1 VIEW COMPARISON:  February 25, 2024 FINDINGS:  Elevation of the right hemidiaphragm. No focal airspace consolidation, pleural effusion, or pneumothorax. No cardiomegaly. No acute fracture or destructive lesion. Multilevel degenerative disc disease of the spine. IMPRESSION: No acute cardiopulmonary abnormality. Electronically Signed   By: Rogelia Myers M.D.   On: 04/23/2024 16:06     Procedures   Medications Ordered in the ED  lactated ringers  infusion (has no administration in time range)  lactated ringers  bolus 1,000 mL (1,000 mLs Intravenous New Bag/Given 04/23/24 1542)    And  lactated ringers  bolus 1,000 mL (1,000 mLs Intravenous  New Bag/Given 04/23/24 1543)    And  lactated ringers  bolus 1,000 mL (1,000 mLs Intravenous New Bag/Given 04/23/24 1644)  metroNIDAZOLE  (FLAGYL ) IVPB 500 mg (500 mg Intravenous New Bag/Given 04/23/24 1643)  vancomycin  (VANCOREADY) IVPB 1750 mg/350 mL (1,750 mg Intravenous New Bag/Given 04/23/24 1643)  acetaminophen  (TYLENOL ) tablet 650 mg (has no administration in time range)    Or  acetaminophen  (TYLENOL ) suppository 650 mg (has no administration in time range)  oxyCODONE  (Oxy IR/ROXICODONE ) immediate release tablet 5 mg (has no administration in time range)  HYDROmorphone  (DILAUDID ) injection 0.5-1 mg (has no administration in time range)  senna-docusate (Senokot-S) tablet 1 tablet (has no administration in time range)  bisacodyl  (DULCOLAX) EC tablet 5 mg (has no administration in time range)  sodium phosphate  (FLEET) enema 1 enema (has no administration in time range)  ondansetron  (ZOFRAN ) tablet 4 mg (has no administration in time range)    Or  ondansetron  (ZOFRAN ) injection 4 mg (has no administration in time range)  nicotine  (NICODERM CQ  - dosed in mg/24 hours) patch 21 mg (has no administration in time range)  enoxaparin  (LOVENOX ) injection 40 mg (has no administration in time range)  metoprolol  tartrate (LOPRESSOR ) injection 5 mg (has no administration in time range)  hydrALAZINE  (APRESOLINE )  injection 10 mg (has no administration in time range)  ipratropium-albuterol  (DUONEB) 0.5-2.5 (3) MG/3ML nebulizer solution 3 mL (has no administration in time range)  0.9 %  sodium chloride  infusion (has no administration in time range)  glucagon  (human recombinant) (GLUCAGEN) injection 1 mg (has no administration in time range)  ceFEPIme  (MAXIPIME ) 2 g in sodium chloride  0.9 % 100 mL IVPB (0 g Intravenous Stopped 04/23/24 1627)  morphine  (PF) 4 MG/ML injection 4 mg (4 mg Intravenous Given 04/23/24 1544)  iohexol  (OMNIPAQUE ) 350 MG/ML injection 75 mL (75 mLs Intravenous Contrast Given 04/23/24 1640)   CRITICAL CARE Performed by: Lonni PARAS Cornel Werber Total critical care time: 35 minutes Critical care time was exclusive of separately billable procedures and treating other patients. Critical care was necessary to treat or prevent imminent or life-threatening deterioration. Critical care was time spent personally by me on the following activities: development of treatment plan with patient and/or surrogate as well as nursing, discussions with consultants, evaluation of patient's response to treatment, examination of patient, obtaining history from patient or surrogate, ordering and performing treatments and interventions, ordering and review of laboratory studies, ordering and review of radiographic studies, pulse oximetry and re-evaluation of patient's condition.                                   Medical Decision Making Amount and/or Complexity of Data Reviewed Labs: ordered. Radiology: ordered.  Risk Prescription drug management. Decision regarding hospitalization.    Douglas Price is a 73 y.o. male with a past medical history significant for Parkinson's disease, polyneuropathy's, diabetes, previous spinal surgery, and sacral wound who presents with sepsis.  According to patient, for the last 2 days she has had worsening low back pain, foul smelling purulence coming from his wound and  it is looking darker and black.  Nurse was concerned about worsening wound and now patient was found a fever 1-3.5 at home and was tachycardic and tachypnea.  They are worried about sepsis.  Patient denies any congestion or cough and denies nausea vomiting.  Reports some constipation and he has a Foley catheter.  Denies significant abdominal pain but does report worsened pain  and foul smell from his wound.  He is worried about sepsis as well.  Due to his vital signs will activate code sepsis.  On my exam, patient has a foul-smelling wound.  See clinical photo.  Will get a CT scan to look for abscess with patient may end up needing MRI during his admission.  Will give broad-spectrum antibiotics.  He will get other workup and will be admitted to medicine for further management.  Medicine will med for further management of sepsis and sacral wound.     Final diagnoses:  Sepsis, due to unspecified organism, unspecified whether acute organ dysfunction present (HCC)  Wound of sacral region, initial encounter    Clinical Impression: 1. Sepsis, due to unspecified organism, unspecified whether acute organ dysfunction present (HCC)   2. Wound of sacral region, initial encounter     Disposition: Admit  This note was prepared with assistance of Dragon voice recognition software. Occasional wrong-word or sound-a-like substitutions may have occurred due to the inherent limitations of voice recognition software.     Dimitrios Balestrieri, Lonni PARAS, MD 04/23/24 [phone]

## 2024-04-23 NOTE — Progress Notes (Signed)
 Pharmacy Antibiotic Note  Douglas Price is a 73 y.o. male admitted on 04/23/2024 with sepsis - sacral cellulitis .  Pharmacy has been consulted for Vancomycin  and Zosyn  dosing. Given cefepime  and Vanc load in ED  Wt ~89 kg  Plan: Zosyn  3.375 gm IV q8h Vancomycin  1250 mg IV Q 12 hrs. Goal AUC 400-550. Expected AUC: 435 SCr used: 0.8 Will f/u renal function, micro data, and pt's clinical condition Vanc levels prn      Temp (24hrs), Avg:99.5 F (37.5 C), Min:99.5 F (37.5 C), Max:99.5 F (37.5 C)  Recent Labs  Lab 04/23/24 1532 04/23/24 1537  WBC 20.6*  --   CREATININE 0.70  --   LATICACIDVEN  --  2.5*    CrCl cannot be calculated (Unknown ideal weight.).    Allergies  Allergen Reactions   Avelox [Moxifloxacin] Other (See Comments)    Angioedema Fatigue   Quinolones Other (See Comments)    Angioedema   Lexapro [Escitalopram] Other (See Comments)    unknown    Antimicrobials this admission: 7/31 Cefepime /Flagyl  x 1 7/31  Zosyn  >>  7/31 Vanc >>   Microbiology results: 7/31 BCx:   Thank you for allowing pharmacy to be a part of this patient's care.  Vito Ralph, PharmD, BCPS Please see amion for complete clinical pharmacist phone list 04/23/2024 5:05 PM

## 2024-04-23 NOTE — Progress Notes (Signed)
 Elink following code sepsis

## 2024-04-23 NOTE — ED Triage Notes (Signed)
 Pt arrives to ED via EMS from [facility] with reports of fever of 103.5. Facility gave tylenol  PTA. Pt denies any other symptoms at this time.

## 2024-04-24 ENCOUNTER — Encounter: Payer: Self-pay | Admitting: Neurology

## 2024-04-24 DIAGNOSIS — R7881 Bacteremia: Secondary | ICD-10-CM

## 2024-04-24 DIAGNOSIS — A419 Sepsis, unspecified organism: Secondary | ICD-10-CM | POA: Diagnosis not present

## 2024-04-24 DIAGNOSIS — L89154 Pressure ulcer of sacral region, stage 4: Secondary | ICD-10-CM

## 2024-04-24 DIAGNOSIS — L0390 Cellulitis, unspecified: Secondary | ICD-10-CM | POA: Diagnosis not present

## 2024-04-24 LAB — CBC
HCT: 30.5 % — ABNORMAL LOW (ref 39.0–52.0)
Hemoglobin: 9.8 g/dL — ABNORMAL LOW (ref 13.0–17.0)
MCH: 29.1 pg (ref 26.0–34.0)
MCHC: 32.1 g/dL (ref 30.0–36.0)
MCV: 90.5 fL (ref 80.0–100.0)
Platelets: 350 K/uL (ref 150–400)
RBC: 3.37 MIL/uL — ABNORMAL LOW (ref 4.22–5.81)
RDW: 13.8 % (ref 11.5–15.5)
WBC: 14.8 K/uL — ABNORMAL HIGH (ref 4.0–10.5)
nRBC: 0 % (ref 0.0–0.2)

## 2024-04-24 LAB — BLOOD CULTURE ID PANEL (REFLEXED) - BCID2

## 2024-04-24 LAB — URINE CULTURE

## 2024-04-24 LAB — BASIC METABOLIC PANEL WITH GFR
Anion gap: 11 (ref 5–15)
BUN: 19 mg/dL (ref 8–23)
CO2: 21 mmol/L — ABNORMAL LOW (ref 22–32)
Calcium: 8 mg/dL — ABNORMAL LOW (ref 8.9–10.3)
Chloride: 100 mmol/L (ref 98–111)
Creatinine, Ser: 0.69 mg/dL (ref 0.61–1.24)
GFR, Estimated: >60 mL/min (ref >60)
Glucose, Bld: 112 mg/dL — ABNORMAL HIGH (ref 70–99)
Potassium: 3.5 mmol/L (ref 3.5–5.1)
Sodium: 132 mmol/L — ABNORMAL LOW (ref 135–145)

## 2024-04-24 LAB — GLUCOSE, CAPILLARY
Glucose-Capillary: 85 mg/dL (ref 70–99)
Glucose-Capillary: 92 mg/dL (ref 70–99)
Glucose-Capillary: 95 mg/dL (ref 70–99)
Glucose-Capillary: 102 mg/dL — ABNORMAL HIGH (ref 70–99)
Glucose-Capillary: 127 mg/dL — ABNORMAL HIGH (ref 70–99)
Glucose-Capillary: 101 mg/dL — ABNORMAL HIGH (ref 70–99)

## 2024-04-24 LAB — MRSA NEXT GEN BY PCR, NASAL: MRSA by PCR Next Gen: NOT DETECTED

## 2024-04-24 LAB — MAGNESIUM: Magnesium: 1.3 mg/dL — ABNORMAL LOW (ref 1.7–2.4)

## 2024-04-24 LAB — PHOSPHORUS: Phosphorus: 3.9 mg/dL (ref 2.5–4.6)

## 2024-04-24 MED ORDER — DUTASTERIDE 0.5 MG PO CAPS
0.5000 mg | ORAL_CAPSULE | Freq: Every day | ORAL | Status: DC
  Administered 2024-04-24 – 2024-04-30 (×7): 0.5 mg via ORAL
  Filled 2024-04-24 (×7): qty 1

## 2024-04-24 MED ORDER — ADULT MULTIVITAMIN W/MINERALS CH
1.0000 | ORAL_TABLET | Freq: Every day | ORAL | Status: DC
  Administered 2024-04-24 – 2024-04-29 (×6): 1 via ORAL
  Filled 2024-04-24 (×7): qty 1

## 2024-04-24 MED ORDER — MAGNESIUM SULFATE 4 GM/100ML IV SOLN
4.0000 g | Freq: Once | INTRAVENOUS | Status: AC
  Administered 2024-04-24: 4 g via INTRAVENOUS
  Filled 2024-04-24: qty 100

## 2024-04-24 MED ORDER — METRONIDAZOLE 500 MG/100ML IV SOLN
500.0000 mg | Freq: Two times a day (BID) | INTRAVENOUS | Status: DC
  Administered 2024-04-24: 500 mg via INTRAVENOUS
  Filled 2024-04-24: qty 100

## 2024-04-24 MED ORDER — SODIUM CHLORIDE 0.9 % IV SOLN
2.0000 g | INTRAVENOUS | Status: DC
  Administered 2024-04-24 – 2024-04-26 (×3): 2 g via INTRAVENOUS
  Filled 2024-04-24 (×3): qty 20

## 2024-04-24 MED ORDER — DAKINS (1/4 STRENGTH) 0.125 % EX SOLN
Freq: Two times a day (BID) | CUTANEOUS | Status: AC
  Filled 2024-04-24 (×2): qty 473

## 2024-04-24 MED ORDER — ENSURE PLUS HIGH PROTEIN PO LIQD
237.0000 mL | Freq: Two times a day (BID) | ORAL | Status: DC
  Administered 2024-04-24 – 2024-04-30 (×12): 237 mL via ORAL

## 2024-04-24 MED ORDER — METRONIDAZOLE 500 MG PO TABS
500.0000 mg | ORAL_TABLET | Freq: Two times a day (BID) | ORAL | Status: DC
  Administered 2024-04-24 – 2024-04-26 (×4): 500 mg via ORAL
  Filled 2024-04-24 (×4): qty 1

## 2024-04-24 MED ORDER — POLYETHYLENE GLYCOL 3350 17 G PO PACK
17.0000 g | PACK | Freq: Every day | ORAL | Status: DC
  Administered 2024-04-24: 17 g via ORAL
  Filled 2024-04-24: qty 1

## 2024-04-24 MED ORDER — DULOXETINE HCL 60 MG PO CPEP
120.0000 mg | ORAL_CAPSULE | Freq: Every day | ORAL | Status: DC
  Administered 2024-04-24 – 2024-04-30 (×7): 120 mg via ORAL
  Filled 2024-04-24 (×2): qty 2
  Filled 2024-04-24: qty 4
  Filled 2024-04-24 (×4): qty 2

## 2024-04-24 MED ORDER — JUVEN PO PACK
1.0000 | PACK | Freq: Two times a day (BID) | ORAL | Status: DC
  Administered 2024-04-24 – 2024-04-30 (×11): 1 via ORAL
  Filled 2024-04-24 (×12): qty 1

## 2024-04-24 MED ORDER — POLYETHYLENE GLYCOL 3350 17 G PO PACK
17.0000 g | PACK | Freq: Two times a day (BID) | ORAL | Status: DC
  Administered 2024-04-27 – 2024-04-30 (×4): 17 g via ORAL
  Filled 2024-04-24 (×10): qty 1

## 2024-04-24 MED ORDER — DOXYCYCLINE HYCLATE 100 MG PO TABS
100.0000 mg | ORAL_TABLET | Freq: Two times a day (BID) | ORAL | Status: DC
  Administered 2024-04-24 – 2024-04-30 (×13): 100 mg via ORAL
  Filled 2024-04-24 (×13): qty 1

## 2024-04-24 MED ORDER — MAGNESIUM SULFATE 2 GM/50ML IV SOLN: 2.0000 g | Freq: Once | INTRAVENOUS | Status: DC

## 2024-04-24 NOTE — Consult Note (Signed)
 WOC Nurse Consult Note: Reason for Consult: sacral wound Patient from SNF worsening sacral PI; hx Parkinson's, DM, history of multiple skin lesions Wound type: Unstageable Pressure Injury; sacrum; + MRI (osteomyelitis) Pressure Injury POA: Yes Measurement: see nursing flow sheets Wound bed:100% black eschar Drainage (amount, consistency, odor) see nursing flow sheets Periwound: erythema  Dressing procedure/placement/frequency: 1/4% Dakins BID packing, cover with ABD pad, secure with tape x7 days and re-eval after day 7.  LALM for moisture management and pressure redistribution PT for wound care; evaluate next week. Requested hospitalist to consult general surgery based on patient/family GOC Consult PT for [facility] seating and pressure redistribution if patient is up in a WC at all  Consult RD for wound supplementation   Re consult if needed, will not follow at this time. Thanks  Alexcia Schools [employer], RN,CWOCN, CNS, [employer] [phone]

## 2024-04-24 NOTE — Progress Notes (Signed)
 Initial Nutrition Assessment  DOCUMENTATION CODES:  Not applicable  INTERVENTION:  Multivitamin w/ minerals daily Ensure Plus High Protein po BID, each supplement provides 350 kcal and 20 grams of protein Double protein portions  Juven po BID, each packet provides 95 calories, 2.5 grams of protein (collagen), and 9.8 grams of carbohydrate (3 grams sugar); also contains 7 grams of L-arginine and L-glutamine, 300 mg vitamin C, 15 mg vitamin E, 1.2 mcg vitamin B-12, 9.5 mg zinc , 200 mg calcium, and 1.5 g  Calcium Beta-hydroxy-Beta-methylbutyrate to support wound healing Encourage good PO intake   NUTRITION DIAGNOSIS:  Increased nutrient needs related to wound healing as evidenced by estimated needs.  GOAL:  Patient will meet greater than or equal to 90% of their needs  MONITOR:  PO intake, Supplement acceptance, Weight trends, Skin  REASON FOR ASSESSMENT:  Consult Wound healing  ASSESSMENT:  73 y.o. male presented to the ED with fever and worsening sacral wound. PMH includes depression, Parkinson's Disease, T2DM, GERD, and BPH. Pt admitted with sepsis due to sacral wound.   RD working remotely at time of assessment. Pt known to RD team from previous admissions. RD unable to obtain nutrition and weight history from pt. Pt with worsening sacral wound, confirmed to have osteomyelitis. Pt required Cortrak placement during previous admission, but was able to have it removed and eating well. RD to order oral nutrition supplements to help with wound healing and PO intake.   Meal Intake 8/01: 100% breakfast  Per chart review, pt with no significant weight loss over the past year. Appears that pt has actually gained weight over the past two months.   Nutrition Related Medications: NovoLog  0-15 units TID, Remeron , Protonix , Miralax, IV antibiotics  Labs: Sodium 132, Potassium 3.5, Phosphorus 3.9, Magnesium 1.3, Hgb A1c 5.6 (02/14/24)  CBG: 85-113 mg/dL x 24 hrs    NUTRITION - FOCUSED  PHYSICAL EXAM: Deferred to follow-up.  Diet Order:   Diet Order             Diet regular Room service appropriate? Yes; Fluid consistency: Thin  Diet effective now                  EDUCATION NEEDS:  No education needs have been identified at this time  Skin:  Skin Assessment: Reviewed RN Assessment Per WOC note on 04/24/24:  - Unstageable Pressure Injury; sacrum; + MRI (osteomyelitis)   Last BM:  7/31 - Type 2 (medium)  Height:  Ht Readings from Last 1 Encounters:  04/23/24 6' 3 (1.905 m)   Weight:  Wt Readings from Last 1 Encounters:  04/23/24 94.2 kg   Ideal Body Weight:  89.1 kg  BMI:  Body mass index is 25.96 kg/m.  Estimated Nutritional Needs:  Kcal:  2200-2400 Protein:  110-130 grams Fluid:  >/= 2 L   Nestora Glatter RD, LDN Clinical Dietitian

## 2024-04-24 NOTE — Progress Notes (Addendum)
 Physical Therapy Wound Treatment Patient Details  Name: Douglas Price MRN: [id number] Date of Birth: Apr 08, 1951  Today's Date: 04/24/2024 Time: 1222-1330 Time Calculation (min): 68 min  Subjective  Subjective Assessment Patient and Family Stated Goals: heal wounds Date of Onset:  (unknown) Prior Treatments: dressing change, pressure redistribution chair pad  Pain Score:  pt given pain medication prior to session, however has 8/10 pain at end of session and MD RN aware  Wound Assessment  Wound 04/24/24 1222 Pressure Injury Sacrum Posterior (Active)  Wound Image   04/24/24 1400  Site / Wound Assessment Black;Painful;Pink;Yellow;Pale;Red;Brown;Bleeding 04/24/24 1400  Peri-wound Assessment Bleeding;Pink;Maceration;Induration;Erythema (blanchable);Denuded 04/24/24 1400  Wound Length (cm) 6.3 cm 04/24/24 1400  Wound Width (cm) 4.2 cm 04/24/24 1400  Wound Surface Area (cm^2) 20.78 cm^2 04/24/24 1400  Wound Depth (cm) 3.1 cm 04/24/24 1400  Wound Volume (cm^3) 42.949 cm^3 04/24/24 1400  Drainage Description Odor - foul;Serosanguineous 04/24/24 1400  Drainage Amount Moderate 04/24/24 1400  Treatment Cleansed;Debridement (Selective);Irrigation 04/24/24 1400  Treatments Cleansed;Irrigation 04/24/24 1400  Dressing Type Gauze (Comment);ABD;Tape dressing;Dakin's-soaked gauze 04/24/24 1400  Dressing Changed New 04/24/24 1400  Dressing Status Clean, Dry, Intact 04/24/24 1400  State of Healing Non-healing 04/24/24 1400  % Wound base Red or Granulating 30% 04/24/24 1400  % Wound base Yellow/Fibrinous Exudate 30% 04/24/24 1400  % Wound base Black/Eschar 10% 04/24/24 1400  % Wound base Other/Granulation Tissue (Comment) 30% 04/24/24 1400  Undermining (cm) 12 oclock 0.8 cm, 3 o'clock 2.2 cm, 6 o'clock 1.8cm, 9 o'clock 2.8cm 04/24/24 1400  Margins Unattached edges (unapproximated) 04/24/24 1400  Non-staged Wound Description Full thickness 04/24/24 1400   Selective Debridement  (non-excisional) Selective Debridement (non-excisional) - Location: sacrum Selective Debridement (non-excisional) - Tools Used: Forceps, Scalpel Selective Debridement (non-excisional) - Tissue Removed: yellow and black eschar from periwound, stringy slough from base, necrotic fascia    Wound Assessment and Plan  Wound Therapy - Assess/Plan/Recommendations Wound Therapy - Clinical Statement: Pt with extensive full thickness wound with bone palable in the base. Wound is progressing outward as there is circumferential undermining. Consult in for surgical assessment. PT removed  a moderate amount of necrotic material in today's session. Wound will benefit from continued selective debridement to decreased bioburden and promote wound healing Wound Therapy - Functional Problem List: decresed mobility, Parkinson's Factors Delaying/Impairing Wound Healing: Incontinence, Immobility, Diabetes Mellitus, Multiple medical problems Hydrotherapy Plan: Patient/family education, Dressing change, Debridement Wound Therapy - Frequency: 2X / week Wound Therapy - Current Recommendations: Surgery consult Wound Therapy - Follow Up Recommendations: dressing changes by RN  Wound Therapy Goals- Improve the function of patient's integumentary system by progressing the wound(s) through the phases of wound healing (inflammation - proliferation - remodeling) by: Wound Therapy Goals - Improve the function of patient's integumentary system by progressing the wound(s) through the phases of wound healing by: Decrease Necrotic Tissue to: 60 Decrease Necrotic Tissue - Progress: Goal set today Increase Granulation Tissue to: 35 Increase Granulation Tissue - Progress: Goal set today Improve Drainage Characteristics: Min Improve Drainage Characteristics - Progress: Goal set today Goals/treatment plan/discharge plan were made with and agreed upon by patient/family: Yes Time For Goal Achievement: 7 days Wound Therapy - Potential  for Goals: Fair  Goals will be updated until maximal potential achieved or discharge criteria met.  Discharge criteria: when goals achieved, discharge from hospital, MD decision/surgical intervention, no progress towards goals, refusal/missing three consecutive treatments without notification or medical reason.  GP     Charges       Almarie  WENDI Fleeta Lapidus PT, DPT Acute Rehabilitation Services Please use secure chat or  Call Office [phone]   Almarie KATHEE Fleeta [facility] 04/24/2024, 3:21 PM

## 2024-04-24 NOTE — Progress Notes (Signed)
 PHARMACY - PHYSICIAN COMMUNICATION CRITICAL VALUE ALERT - BLOOD CULTURE IDENTIFICATION (BCID)  Douglas Price is an 73 y.o. male who presented to [facility] on 04/23/2024 with a chief complaint of fever, lower back pain, drainage related to sacral wound.   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 Caleen  Current antibiotics: Vancomycin , piperacillin nadine  Changes to prescribed antibiotics recommended:  Recommend targeting Proteus species by de-escalation from pip/tazo to ceftriaxone  2 g IV every 24 hours and metronidazole  500 mg IV every 12 hours.  Continue vancomycin  IV 1250 mg every 12 hours.   Results for orders placed or performed during the hospital encounter of 04/23/24  Blood Culture ID Panel (Reflexed) (Collected: 04/23/2024  2:52 PM)  Result Value Ref Range   Enterococcus faecalis NOT DETECTED NOT DETECTED   Enterococcus Faecium NOT DETECTED NOT DETECTED   Listeria monocytogenes NOT DETECTED NOT DETECTED   Staphylococcus species NOT DETECTED NOT DETECTED   Staphylococcus aureus (BCID) NOT DETECTED NOT DETECTED   Staphylococcus epidermidis NOT DETECTED NOT DETECTED   Staphylococcus lugdunensis NOT DETECTED NOT DETECTED   Streptococcus species NOT DETECTED NOT DETECTED   Streptococcus agalactiae NOT DETECTED NOT DETECTED   Streptococcus pneumoniae NOT DETECTED NOT DETECTED   Streptococcus pyogenes NOT DETECTED NOT DETECTED   A.calcoaceticus-baumannii NOT DETECTED NOT DETECTED   Bacteroides fragilis NOT DETECTED NOT DETECTED   Enterobacterales DETECTED (A) NOT DETECTED   Enterobacter cloacae complex NOT DETECTED NOT DETECTED   Escherichia coli NOT DETECTED NOT DETECTED   Klebsiella aerogenes NOT DETECTED NOT DETECTED   Klebsiella oxytoca NOT DETECTED NOT DETECTED   Klebsiella pneumoniae NOT DETECTED NOT DETECTED   Proteus species DETECTED (A) NOT DETECTED   Salmonella species NOT  DETECTED NOT DETECTED   Serratia marcescens NOT DETECTED NOT DETECTED   Haemophilus influenzae NOT DETECTED NOT DETECTED   Neisseria meningitidis NOT DETECTED NOT DETECTED   Pseudomonas aeruginosa NOT DETECTED NOT DETECTED   Stenotrophomonas maltophilia NOT DETECTED NOT DETECTED   Candida albicans NOT DETECTED NOT DETECTED   Candida auris NOT DETECTED NOT DETECTED   Candida glabrata NOT DETECTED NOT DETECTED   Candida krusei NOT DETECTED NOT DETECTED   Candida parapsilosis NOT DETECTED NOT DETECTED   Candida tropicalis NOT DETECTED NOT DETECTED   Cryptococcus neoformans/gattii NOT DETECTED NOT DETECTED   CTX-M ESBL NOT DETECTED NOT DETECTED   Carbapenem resistance IMP NOT DETECTED NOT DETECTED   Carbapenem resistance KPC NOT DETECTED NOT DETECTED   Carbapenem resistance NDM NOT DETECTED NOT DETECTED   Carbapenem resist OXA 48 LIKE NOT DETECTED NOT DETECTED   Carbapenem resistance VIM NOT DETECTED NOT DETECTED    Feliciano Close, PharmD PGY2 Infectious Diseases Pharmacy Resident  04/24/2024 8:15 AM

## 2024-04-24 NOTE — Progress Notes (Signed)
 PROGRESS NOTE    Douglas Price  FMW:[id number] DOB: [birth year] DOA: 04/23/2024 PCP: Janey Santos, MD    Brief Narrative:  73 y.o. male with medical history significant of depression, Parkinson's disease, DM 2, GERD, RLS, chronic sacral wound, insomnia, chronic urinary retention with Foley in place comes to the hospital for evaluation of fever.  Patient admitted for severe sepsis secondary to sacral wound infection versus urinary tract infection.  Blood culture started growing GNR.   Assessment & Plan:  Principal Problem:   Sepsis due to cellulitis [facility]) Active Problems:   Parkinson's disease (HCC)   Multiple wounds of skin   Diabetic polyneuropathy associated with type 2 diabetes mellitus (HCC)   Depression   S/P lumbar laminectomy     Severe sepsis secondary to sacral cellulitis/Osteomyelitis.  Gram-negative rods bacteremia -Continue to follow culture data.   Wound care team has been consulted, MRSA nasal swab.  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  Continue to follow wound care.  Discussed with ID, will consult general surgery for evaluation for debridement  Chronic urinary retention, Foley present POA CAUTI BPH - Chronically wears Foley catheter.  Last changed by urology 7/28.  Foley catheter exchanged again in the hospital 8/1 -Flomax    History of Parkinson disease Dysphagia - Continue Sinemet  -Reportedly on regular diet    Diabetes mellitus type 2 Diabetic peripheral neuropathy - Sliding scale and Accu-Chek.   GERD - PPI   Depression/anxiety - Continue home medications   Insomnia - Continue home medications   Essential hypertension - Nebivolol .  IV as needed  Hypomagnesemia - As needed repletion    DVT prophylaxis: Lovenox      Code Status: Full Code Family Communication:   Status is:  Inpatient Remains inpatient appropriate because: Ongoing management for severe sepsis    Subjective:  Seen at bedside, feels a little better  Examination:  General exam: Appears calm and comfortable  Respiratory system: Clear to auscultation. Respiratory effort normal. Cardiovascular system: S1 & S2 heard, RRR. No JVD, murmurs, rubs, gallops or clicks. No pedal edema. Gastrointestinal system: Abdomen is nondistended, soft and nontender. No organomegaly or masses felt. Normal bowel sounds heard. Central nervous system: Alert and oriented. No focal neurological deficits. Extremities: Symmetric 5 x 5 power. Skin: No rashes, lesions or ulcers Psychiatry: Judgement and insight appear normal. Mood & affect appropriate. Sacral wound with foul-smelling drainage               Diet Orders (From admission, onward)     Start     Ordered   04/23/24 1644  Diet regular Room service appropriate? Yes; Fluid consistency: Thin  Diet effective now       Question Answer Comment  Room service appropriate? Yes   Fluid consistency: Thin      04/23/24 1644            Objective: Vitals:   04/23/24 2200 04/23/24 2325 04/24/24 0412 04/24/24 0730  BP:  114/65 113/61 113/66  Pulse:  89 74   Resp:  17 17 16   Temp: 99.1 F (37.3 C) 98.9 F (37.2 C) 98.6 F (37 C) 98.2 F (36.8 C)  TempSrc: Oral Oral    SpO2:  94%    Weight:      Height:        Intake/Output Summary (Last 24 hours) at 04/24/2024 1045 Last data filed at 04/24/2024  0900 Gross per 24 hour  Intake 360 ml  Output --  Net 360 ml   Filed Weights   04/23/24 1812  Weight: 94.2 kg    Scheduled Meds:  buPROPion   300 mg Oral Daily   carbidopa -levodopa   1 tablet Oral TID   Chlorhexidine  Gluconate Cloth  6 each Topical Q0600   DULoxetine   120 mg Oral Daily   dutasteride   0.5 mg Oral Daily   enoxaparin  (LOVENOX ) injection  40 mg Subcutaneous Q24H   insulin  aspart  0-15 Units Subcutaneous TID WC   lithium  carbonate   150 mg Oral QPM   mirtazapine   15 mg Oral QHS   nebivolol   2.5 mg Oral Daily   pantoprazole   40 mg Oral Daily   polyethylene glycol  17 g Oral Daily   sodium hypochlorite   Irrigation BID   tamsulosin   0.4 mg Oral q1800   Continuous Infusions:  sodium chloride  75 mL/hr at 04/23/24 1857   cefTRIAXone  (ROCEPHIN )  IV     magnesium sulfate bolus IVPB     metronidazole      vancomycin  1,250 mg (04/24/24 0415)    Nutritional status     Body mass index is 25.96 kg/m.  Data Reviewed:   CBC: Recent Labs  Lab 04/23/24 1532 04/23/24 1643 04/24/24 0919  WBC 20.6* 17.3* 14.8*  NEUTROABS 18.0*  --   --   HGB 11.4* 9.5* 9.8*  HCT 36.1* 30.4* 30.5*  MCV 90.9 92.1 90.5  PLT 424* 357 350   Basic Metabolic Panel: Recent Labs  Lab 04/23/24 1532 04/23/24 1643 04/24/24 0919  NA 135  --  132*  K 4.2  --  3.5  CL 100  --  100  CO2 23  --  21*  GLUCOSE 116*  --  112*  BUN 22  --  19  CREATININE 0.70 0.80 0.69  CALCIUM 8.7*  --  8.0*  MG  --   --  1.3*  PHOS  --   --  3.9   GFR: Estimated Creatinine Clearance: 102.7 mL/min (by C-G formula based on SCr of 0.69 mg/dL). Liver Function Tests: Recent Labs  Lab 04/23/24 1532  AST 18  ALT 16  ALKPHOS 65  BILITOT 0.7  PROT 6.5  ALBUMIN  2.6*   No results for input(s): LIPASE, AMYLASE in the last 168 hours. No results for input(s): AMMONIA in the last 168 hours. Coagulation Profile: Recent Labs  Lab 04/23/24 1532  INR 1.2   Cardiac Enzymes: No results for input(s): CKTOTAL, CKMB, CKMBINDEX, TROPONINI in the last 168 hours. BNP (last 3 results) No results for input(s): PROBNP in the last 8760 hours. HbA1C: No results for input(s): HGBA1C in the last 72 hours. CBG: Recent Labs  Lab 04/23/24 1446 04/23/24 2237 04/24/24 0515 04/24/24 0556  GLUCAP 113* 105* 85 92   Lipid Profile: No results for input(s): CHOL, HDL, LDLCALC, TRIG, CHOLHDL, LDLDIRECT in the last 72 hours. Thyroid Function  Tests: No results for input(s): TSH, T4TOTAL, FREET4, T3FREE, THYROIDAB in the last 72 hours. Anemia Panel: No results for input(s): VITAMINB12, FOLATE, FERRITIN, TIBC, IRON, RETICCTPCT in the last 72 hours. Sepsis Labs: Recent Labs  Lab 04/23/24 1537 04/23/24 1726  LATICACIDVEN 2.5* 2.4*    Recent Results (from the past 240 hours)  Blood Culture (routine x 2)     Status: None (Preliminary result)   Collection Time: 04/23/24  2:52 PM   Specimen: BLOOD  Result Value Ref Range Status   Specimen Description BLOOD SITE  NOT SPECIFIED  Final   Special Requests   Final    BOTTLES DRAWN AEROBIC AND ANAEROBIC Blood Culture adequate volume   Culture  Setup Time   Final    GRAM NEGATIVE RODS ANAEROBIC BOTTLE ONLY CRITICAL RESULT CALLED TO, READ BACK BY AND VERIFIED WITH: MAYA MAFFUCCI [id number] FCP Performed at [facility], [address] [place], [place] [address]    Culture GRAM NEGATIVE RODS  Final   Report Status PENDING  Incomplete  Blood Culture ID Panel (Reflexed)     Status: Abnormal   Collection Time: 04/23/24  2:52 PM  Result Value Ref Range Status   Enterococcus faecalis NOT DETECTED NOT DETECTED Final   Enterococcus Faecium NOT DETECTED NOT DETECTED Final   Listeria monocytogenes NOT DETECTED NOT DETECTED Final   Staphylococcus species NOT DETECTED NOT DETECTED Final   Staphylococcus aureus (BCID) NOT DETECTED NOT DETECTED Final   Staphylococcus epidermidis NOT DETECTED NOT DETECTED Final   Staphylococcus lugdunensis NOT DETECTED NOT DETECTED Final   Streptococcus species NOT DETECTED NOT DETECTED Final   Streptococcus agalactiae NOT DETECTED NOT DETECTED Final   Streptococcus pneumoniae NOT DETECTED NOT DETECTED Final   Streptococcus pyogenes NOT DETECTED NOT DETECTED Final   A.calcoaceticus-baumannii NOT DETECTED NOT DETECTED Final   Bacteroides fragilis NOT DETECTED NOT DETECTED Final   Enterobacterales DETECTED (A) NOT DETECTED Final     Comment: Enterobacterales represent a large order of gram negative bacteria, not a single organism. CRITICAL RESULT CALLED TO, READ BACK BY AND VERIFIED WITH: PHARMD BLAKE [id number] FCP    Enterobacter cloacae complex NOT DETECTED NOT DETECTED Final   Escherichia coli NOT DETECTED NOT DETECTED Final   Klebsiella aerogenes NOT DETECTED NOT DETECTED Final   Klebsiella oxytoca NOT DETECTED NOT DETECTED Final   Klebsiella pneumoniae NOT DETECTED NOT DETECTED Final   Proteus species DETECTED (A) NOT DETECTED Final    Comment: CRITICAL RESULT CALLED TO, READ BACK BY AND VERIFIED WITH: PHARMD BLAKE [id number] 0722 FCP    Salmonella species NOT DETECTED NOT DETECTED Final   Serratia marcescens NOT DETECTED NOT DETECTED Final   Haemophilus influenzae NOT DETECTED NOT DETECTED Final   Neisseria meningitidis NOT DETECTED NOT DETECTED Final   Pseudomonas aeruginosa NOT DETECTED NOT DETECTED Final   Stenotrophomonas maltophilia NOT DETECTED NOT DETECTED Final   Candida albicans NOT DETECTED NOT DETECTED Final   Candida auris NOT DETECTED NOT DETECTED Final   Candida glabrata NOT DETECTED NOT DETECTED Final   Candida krusei NOT DETECTED NOT DETECTED Final   Candida parapsilosis NOT DETECTED NOT DETECTED Final   Candida tropicalis NOT DETECTED NOT DETECTED Final   Cryptococcus neoformans/gattii NOT DETECTED NOT DETECTED Final   CTX-M ESBL NOT DETECTED NOT DETECTED Final   Carbapenem resistance IMP NOT DETECTED NOT DETECTED Final   Carbapenem resistance KPC NOT DETECTED NOT DETECTED Final   Carbapenem resistance NDM NOT DETECTED NOT DETECTED Final   Carbapenem resist OXA 48 LIKE NOT DETECTED NOT DETECTED Final   Carbapenem resistance VIM NOT DETECTED NOT DETECTED Final    Comment: Performed at [facility], [address] [place], [place] [address]  Resp panel by RT-PCR (RSV, Flu A&B, Covid) Anterior Nasal Swab     Status: None   Collection Time: 04/23/24  5:03 PM   Specimen: Anterior  Nasal Swab  Result Value Ref Range Status   SARS Coronavirus 2 by RT PCR NEGATIVE NEGATIVE Final   Influenza A by PCR NEGATIVE NEGATIVE Final  Influenza B by PCR NEGATIVE NEGATIVE Final    Comment: (NOTE) The Xpert Xpress SARS-CoV-2/FLU/RSV plus assay is intended as an aid in the diagnosis of influenza from Nasopharyngeal swab specimens and should not be used as a sole basis for treatment. Nasal washings and aspirates are unacceptable for Xpert Xpress SARS-CoV-2/FLU/RSV testing.  Fact Sheet for Patients: BloggerCourse.com  Fact Sheet for Healthcare Providers: SeriousBroker.it  This test is not yet approved or cleared by the [place]  FDA and has been authorized for detection and/or diagnosis of SARS-CoV-2 by FDA under an Emergency Use Authorization (EUA). This EUA will remain in effect (meaning this test can be used) for the duration of the COVID-19 declaration under Section 564(b)(1) of the Act, 21 U.S.C. section 360bbb-3(b)(1), unless the authorization is terminated or revoked.     Resp Syncytial Virus by PCR NEGATIVE NEGATIVE Final    Comment: (NOTE) Fact Sheet for Patients: BloggerCourse.com  Fact Sheet for Healthcare Providers: SeriousBroker.it  This test is not yet approved or cleared by the [place]  FDA and has been authorized for detection and/or diagnosis of SARS-CoV-2 by FDA under an Emergency Use Authorization (EUA). This EUA will remain in effect (meaning this test can be used) for the duration of the COVID-19 declaration under Section 564(b)(1) of the Act, 21 U.S.C. section 360bbb-3(b)(1), unless the authorization is terminated or revoked.  Performed at [facility], [address], [place], [place] [address]   Blood Culture (routine x 2)     Status: None (Preliminary result)   Collection Time: 04/23/24  6:25 PM   Specimen: BLOOD LEFT HAND   Result Value Ref Range Status   Specimen Description BLOOD LEFT HAND  Final   Special Requests   Final    BOTTLES DRAWN AEROBIC ONLY Blood Culture results may not be optimal due to an inadequate volume of blood received in culture bottles   Culture   Final    NO GROWTH < 12 HOURS Performed at [facility], [address], [place], [place] [address]    Report Status PENDING  Incomplete         Radiology Studies: MR Lumbar Spine W Wo Contrast Result Date: 04/24/2024 CLINICAL DATA:  73 year old male with chronic myelopathy. Query spinal infection. EXAM: MRI LUMBAR SPINE WITHOUT AND WITH CONTRAST TECHNIQUE: Multiplanar and multiecho pulse sequences of the lumbar spine were obtained without and with intravenous contrast. CONTRAST:  9mL GADAVIST  GADOBUTROL  1 MMOL/ML IV SOLN COMPARISON:  Sacral MRI the same day reported separately. Lumbar MRI 10/31/2018. CT Abdomen and Pelvis 04/23/2024. FINDINGS: Segmentation: Normal on the CT yesterday which is the same numbering system used on the 2020 MRI. Alignment: Lumbar lordosis with chronic grade 1 anterolisthesis of L4 on L5. Underlying levoconvex lumbar scoliosis is moderate, better demonstrated on the recent CT. Degenerative appearing retrolisthesis of L1 on L2 is chronic but progressed since 2020. Vertebrae: Chronic postoperative changes with hardware susceptibility artifact L2 through L5. Normal background bone marrow signal. CT evidence of solid arthrodesis through those levels, and also degenerative interbody ankylosis at L5-S1. Visible lower thoracic levels and L1 vertebrae appear intact with maintained height. L1 benign vertebral body hemangioma (normal variant). Sacrum is described separately. No lower thoracic or lumbar marrow edema or acute osseous abnormality. Conus medullaris and cauda equina: Conus extends to the T12-L1 level. No lower spinal cord or conus signal abnormality. Fatty filum terminalis, normal variant. No abnormal intradural  enhancement. No dural thickening. Paraspinal and other soft tissues: Postoperative changes to the  posterior lumbar paraspinal soft tissues, no change since 2020. No lumbar paraspinal fluid collection. Stable visible abdominal viscera. Disc levels: T11-T12 and T12-L1: Negative, and interbody ankylosis at those levels from Diffuse idiopathic skeletal hyperostosis (DISH). Demonstrated on CT yesterday. L1-L2: Severe disc space loss with vacuum disc by CT. Mild retrolisthesis, chronic but increased since 2020. Moderate to severe multifactorial spinal stenosis (series 8, image 13 and series 5, image 9. Moderate left and moderate to severe right L1 foraminal stenosis. This level has progressed since 2020. L2-L3:  Decompression and fusion with no adverse features. L3-L4:  Decompression and fusion with no adverse features. L4-L5: Decompression and fusion. Chronic severe right side osseous L4 foraminal stenosis, stable since 2020. L5-S1: Interbody ankylosis. Bulky circumferential disc osteophyte complex. No spinal stenosis. Mild to moderate chronic left lateral recess stenosis is stable since 2020. Moderate left greater than right L5 foraminal stenosis also stable. IMPRESSION: 1. No evidence of Lumbar Spine infection. See Sacrum MRI reported separately. 2. 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 Chronic lumbar fusion L2 through L5. And degenerative interbody ankylosis at L5-S1. 3. 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 Electronically Signed   By: VEAR Hurst M.D.   On: 04/24/2024 09:07   MR SACRUM SI JOINTS W WO CONTRAST Result Date: 04/24/2024 CLINICAL DATA:  Chronic myelopathy, for infection. Parkinson's disease. Chronic sacral wound. Chronic urinary retention. EXAM: MRI SACRUM WITHOUT CONTRAST TECHNIQUE: Multiplanar multi-sequence MR imaging of the sacrum was performed. No  intravenous contrast was administered. COMPARISON:  CT pelvis 04/23/2024 FINDINGS: OSSEOUS STRUCTURES AND JOINTS: Abnormal edema signal compatible with osteomyelitis involving the lower fifth segment of the sacrum and upper coccyx as on image 13 series 6 with deep overlying sacral decubitus ulcer extending essentially to the periosteal margin as shown on image 9 series 13. Postoperative findings in the lower lumbar spine, please see dedicated lumbar spine MRI report. Mild spurring of the SI joints but no SI joint effusion. Trace presacral edema. MUSCULOTENDINOUS: Unremarkable SACRAL PLEXUS: No impinging lesion is identified involving the sacral plexus or proximal sciatic nerves. OTHER: Foley catheter in the otherwise empty urinary bladder. Moderate prominence of stool in the rectal vault, cannot exclude fecal impaction. IMPRESSION: 1. Osteomyelitis involving the lower fifth segment of the sacrum and adjacent upper coccyx with deep overlying sacral decubitus ulcer extending essentially to the periosteal margin. 2. Moderate prominence of stool in the rectal vault, cannot exclude fecal impaction. Electronically Signed   By: Ryan Salvage M.D.   On: 04/24/2024 08:29   CT ABDOMEN PELVIS W CONTRAST Result Date: 04/23/2024 CLINICAL DATA:  Sepsis.  Worsening sacral wound. EXAM: CT ABDOMEN AND PELVIS WITH CONTRAST TECHNIQUE: Multidetector CT imaging of the abdomen and pelvis was performed using the standard protocol following bolus administration of intravenous contrast. RADIATION DOSE REDUCTION: This exam was performed according to the departmental dose-optimization program which includes automated exposure control, adjustment of the mA and/or kV according to patient size and/or use of iterative reconstruction technique. CONTRAST:  75mL OMNIPAQUE  IOHEXOL  350 MG/ML SOLN COMPARISON:  CT abdomen pelvis dated 03/19/2024. FINDINGS: Lower chest: No acute abnormality. No intra-abdominal free air or free fluid.  Hepatobiliary: Small liver cysts. No biliary dilatation. The gallbladder is unremarkable Pancreas: Unremarkable. No pancreatic ductal dilatation or surrounding inflammatory changes. Spleen: Normal in size without focal abnormality. Adrenals/Urinary Tract: The adrenal glands unremarkable. Small bilateral renal cysts. There is no hydronephrosis on either side.  There is symmetric enhancement and excretion of contrast by both kidneys. The visualized ureters appear unremarkable. The urinary bladder is decompressed around a Foley catheter. Stomach/Bowel: Moderate stool throughout the colon. There is no bowel obstruction or active inflammation. The appendix is not visualized with certainty. No inflammatory changes identified in the right lower quadrant. Vascular/Lymphatic: Mild aortoiliac atherosclerotic disease. The IVC is unremarkable. No portal venous gas. There is no adenopathy. Reproductive: The prostate and seminal vesicles are grossly unremarkable. No pelvic mass. Other: Small fat containing right inguinal hernia. Interval development of a skin ulcer over the distal sacrum extending deep into the soft tissues to the level of the bone. No definite bone erosion or evidence of acute osteomyelitis by CT. No fluid collection or abscess. Musculoskeletal: Osteopenia with degenerative changes of the spine. Multilevel lumbar fusion. No acute osseous pathology. IMPRESSION: 1. Interval development of a skin ulcer over the distal sacrum. No evidence of acute osteomyelitis by CT. No fluid collection or abscess. 2. No bowel obstruction. 3.  Aortic Atherosclerosis (ICD10-I70.0). Electronically Signed   By: Vanetta Chou M.D.   On: 04/23/2024 16:49   DG Chest Port 1 View Result Date: 04/23/2024 CLINICAL DATA:  Questionable sepsis - evaluate for abnormality EXAM: PORTABLE CHEST - 1 VIEW COMPARISON:  February 25, 2024 FINDINGS: Elevation of the right hemidiaphragm. No focal airspace consolidation, pleural effusion, or pneumothorax.  No cardiomegaly. No acute fracture or destructive lesion. Multilevel degenerative disc disease of the spine. IMPRESSION: No acute cardiopulmonary abnormality. Electronically Signed   By: Rogelia Myers M.D.   On: 04/23/2024 16:06           LOS: 1 day   Time spent= 35 mins    Burgess JAYSON Dare, MD [facility]  If 7PM-7AM, please contact night-coverage  04/24/2024, 10:45 AM

## 2024-04-24 NOTE — Consult Note (Signed)
 [facility]    Date of Admission:  04/23/2024     Reason for Consult: bacteremia    Referring Provider: Caleen Sora     Lines:  Peripheral iv's  Abx: 8/01-c doxy 8/01-c ceftriaxone  7/31-c metronidazole   7/31-8/1 vanc 7/31-8/1 piptazo        Assessment: 73 y.o. male parkinson disease, chronic foley, gerd, rls, dm2 admitted with sepsis found to have proteus bacteremia in setting recentlyly developed stage 4 decub sacral ulcer   Source bacteremia likely the sacral ulcers Necrotic eschar on ulcer and probably could benefit from wound care. Reasonable to ask surgery to comment if I&D needed  He has parkinson but functional and has been ambulatory so will try to treat for presumed OM. Not sure if there would be sampling potential for his sacrum but I wouldn't push  These cases generally end up with bsAbx  Plan 6 week course abx and can transition to final regimen once proteus susceptibility known   Plan: Continue ceftriaxone /flagyl  for now; add doxy Please discuss with wound care and consider discussion with gen surg regarding I&D Needs outpatient wound care If proteus senstive to amox/clav can discharge on 6 weeks doxy/amox-clav Oncoming id team to review culture and arrange outpatient id clinic f/u Maintain standard isolation precaution      ------------------------------------------------ Principal Problem:   Sepsis due to cellulitis (HCC) Active Problems:   S/P lumbar laminectomy   Parkinson's disease (HCC)   Diabetic polyneuropathy associated with type 2 diabetes mellitus (HCC)   Multiple wounds of skin   Depression    HPI: Douglas Price is a 73 y.o. male parkinson disease, chronic foley, gerd, rls, dm2 admitted with sepsis found to have proteus bacteremia in setting recentlyly developed stage 4 decub sacral ulcer  Patient has parkinson but was walking/working until a couple months ago He recently fell and has been  weak so more time in bed. He still gets around with fww at home  He had developed a sacral ulcer of at least several weeks  The last few days pta had subjective fever and pain in the lower back so came to ed  Febrile in er Wbc 17 Bcx proteus species Mri sacral spine with om changes Started on ceftriaxone /flagyl   Sepsis had resolved  No other complaint  Said he bumped his legs into objects at home a bit due to neuropathy    Family History  Problem Relation Age of Onset   Heart failure Mother    Lung cancer Father    Neuropathy Brother    Hypertension Paternal Grandfather    Tuberculosis Maternal Uncle    Cancer - Colon Maternal Uncle     Social History   Tobacco Use   Smoking status: Some Days    Types: Pipe   Smokeless tobacco: Never   Tobacco comments:    moderate, 1-2bowls a day or more  Vaping Use   Vaping status: Never Used  Substance Use Topics   Alcohol use: No    Alcohol/week: 0.0 standard drinks of alcohol   Drug use: No    Allergies  Allergen Reactions   Avelox [Moxifloxacin] Other (See Comments)    Angioedema Fatigue   Quinolones Other (See Comments)    Angioedema   Lexapro [Escitalopram] Other (See Comments)    unknown    Review of Systems: ROS All Other ROS was negative, except mentioned above   Past Medical History:  Diagnosis Date  Allergic rhinitis due to pollen    Benign neoplasm of colon    Blisters with epidermal loss due to burn (second degree) of foot    Complication of anesthesia    pt woke up during saphenous vein surgery-had epidural   Constipation due to pain medication    Degeneration of lumbar or lumbosacral intervertebral disc    Diverticulosis of colon (without mention of hemorrhage)    DM (diabetes mellitus) (HCC)    Esophageal reflux    Hypertrophy of prostate with urinary obstruction and other lower urinary tract symptoms (LUTS)    Loss of weight    Neuromuscular disorder (HCC)    peripheral neuropathy    Obesity, unspecified    Pain in limb    Restless legs syndrome (RLS)    Sleep disturbance 03/24/2014   Spinal stenosis, unspecified region other than cervical    Tobacco use disorder    Unspecified disease of pericardium    Unspecified essential hypertension    Unspecified local infection of skin and subcutaneous tissue    Varices of other sites        Scheduled Meds:  buPROPion   300 mg Oral Daily   carbidopa -levodopa   1 tablet Oral TID   Chlorhexidine  Gluconate Cloth  6 each Topical Q0600   DULoxetine   120 mg Oral Daily   dutasteride   0.5 mg Oral Daily   enoxaparin  (LOVENOX ) injection  40 mg Subcutaneous Q24H   feeding supplement  237 mL Oral BID BM   insulin  aspart  0-15 Units Subcutaneous TID WC   lithium  carbonate  150 mg Oral QPM   mirtazapine   15 mg Oral QHS   multivitamin with minerals  1 tablet Oral Daily   nebivolol   2.5 mg Oral Daily   nutrition supplement (JUVEN)  1 packet Oral BID BM   pantoprazole   40 mg Oral Daily   polyethylene glycol  17 g Oral BID   sodium hypochlorite   Irrigation BID   tamsulosin   0.4 mg Oral q1800   Continuous Infusions:  sodium chloride  75 mL/hr at 04/23/24 1857   cefTRIAXone  (ROCEPHIN )  IV 2 g (04/24/24 1158)   magnesium sulfate bolus IVPB 4 g (04/24/24 1351)   metronidazole  500 mg (04/24/24 1219)   vancomycin  1,250 mg (04/24/24 0415)   PRN Meds:.acetaminophen  **OR** acetaminophen , bisacodyl , clonazePAM , glucagon  (human recombinant), hydrALAZINE , HYDROmorphone  (DILAUDID ) injection, ipratropium-albuterol , metoprolol  tartrate, nicotine , ondansetron  **OR** ondansetron  (ZOFRAN ) IV, oxyCODONE , senna-docusate, sodium phosphate , temazepam    OBJECTIVE: Blood pressure (!) 140/69, pulse 85, temperature 98.8 F (37.1 C), resp. rate 17, height 6' 3 (1.905 m), weight 94.2 kg, SpO2 (!) 89%.  Physical Exam  General/constitutional: no distress, pleasant HEENT: Normocephalic, PER, Conj Clear, EOMI, Oropharynx clear Neck supple CV: rrr no  mrg Lungs: clear to auscultation, normal respiratory effort Abd: Soft, Nontender Ext: no edema Skin: multiple small eschar in bilateral lower ext (reports neuropathy and bumping legs into objects  8/1 picture  Neuro: generalized weakness, mild intention tremor MSK: no peripheral joint swelling/tenderness/warmth; back spines nontender      Lab Results Lab Results  Component Value Date   WBC 14.8 (H) 04/24/2024   HGB 9.8 (L) 04/24/2024   HCT 30.5 (L) 04/24/2024   MCV 90.5 04/24/2024   PLT 350 04/24/2024    Lab Results  Component Value Date   CREATININE 0.69 04/24/2024   BUN 19 04/24/2024   NA 132 (L) 04/24/2024   K 3.5 04/24/2024   CL 100 04/24/2024   CO2 21 (L) 04/24/2024  Lab Results  Component Value Date   ALT 16 04/23/2024   AST 18 04/23/2024   ALKPHOS 65 04/23/2024   BILITOT 0.7 04/23/2024      Microbiology: Recent Results (from the past 240 hours)  Blood Culture (routine x 2)     Status: None (Preliminary result)   Collection Time: 04/23/24  2:52 PM   Specimen: BLOOD  Result Value Ref Range Status   Specimen Description BLOOD SITE NOT SPECIFIED  Final   Special Requests   Final    BOTTLES DRAWN AEROBIC AND ANAEROBIC Blood Culture adequate volume   Culture  Setup Time   Final    GRAM NEGATIVE RODS ANAEROBIC BOTTLE ONLY CRITICAL RESULT CALLED TO, READ BACK BY AND VERIFIED WITH: MAYA MAFFUCCI [id number] FCP Performed at [facility], [address] [place], [place] [address]    Culture GRAM NEGATIVE RODS  Final   Report Status PENDING  Incomplete  Blood Culture ID Panel (Reflexed)     Status: Abnormal   Collection Time: 04/23/24  2:52 PM  Result Value Ref Range Status   Enterococcus faecalis NOT DETECTED NOT DETECTED Final   Enterococcus Faecium NOT DETECTED NOT DETECTED Final   Listeria monocytogenes NOT DETECTED NOT DETECTED Final   Staphylococcus species NOT DETECTED NOT DETECTED Final   Staphylococcus aureus (BCID) NOT DETECTED NOT  DETECTED Final   Staphylococcus epidermidis NOT DETECTED NOT DETECTED Final   Staphylococcus lugdunensis NOT DETECTED NOT DETECTED Final   Streptococcus species NOT DETECTED NOT DETECTED Final   Streptococcus agalactiae NOT DETECTED NOT DETECTED Final   Streptococcus pneumoniae NOT DETECTED NOT DETECTED Final   Streptococcus pyogenes NOT DETECTED NOT DETECTED Final   A.calcoaceticus-baumannii NOT DETECTED NOT DETECTED Final   Bacteroides fragilis NOT DETECTED NOT DETECTED Final   Enterobacterales DETECTED (A) NOT DETECTED Final    Comment: Enterobacterales represent a large order of gram negative bacteria, not a single organism. CRITICAL RESULT CALLED TO, READ BACK BY AND VERIFIED WITH: PHARMD BLAKE [id number] 0722 FCP    Enterobacter cloacae complex NOT DETECTED NOT DETECTED Final   Escherichia coli NOT DETECTED NOT DETECTED Final   Klebsiella aerogenes NOT DETECTED NOT DETECTED Final   Klebsiella oxytoca NOT DETECTED NOT DETECTED Final   Klebsiella pneumoniae NOT DETECTED NOT DETECTED Final   Proteus species DETECTED (A) NOT DETECTED Final    Comment: CRITICAL RESULT CALLED TO, READ BACK BY AND VERIFIED WITH: PHARMD BLAKE [id number] 0722 FCP    Salmonella species NOT DETECTED NOT DETECTED Final   Serratia marcescens NOT DETECTED NOT DETECTED Final   Haemophilus influenzae NOT DETECTED NOT DETECTED Final   Neisseria meningitidis NOT DETECTED NOT DETECTED Final   Pseudomonas aeruginosa NOT DETECTED NOT DETECTED Final   Stenotrophomonas maltophilia NOT DETECTED NOT DETECTED Final   Candida albicans NOT DETECTED NOT DETECTED Final   Candida auris NOT DETECTED NOT DETECTED Final   Candida glabrata NOT DETECTED NOT DETECTED Final   Candida krusei NOT DETECTED NOT DETECTED Final   Candida parapsilosis NOT DETECTED NOT DETECTED Final   Candida tropicalis NOT DETECTED NOT DETECTED Final   Cryptococcus neoformans/gattii NOT DETECTED NOT DETECTED Final   CTX-M ESBL NOT DETECTED NOT DETECTED  Final   Carbapenem resistance IMP NOT DETECTED NOT DETECTED Final   Carbapenem resistance KPC NOT DETECTED NOT DETECTED Final   Carbapenem resistance NDM NOT DETECTED NOT DETECTED Final   Carbapenem resist OXA 48 LIKE NOT DETECTED NOT DETECTED Final   Carbapenem resistance VIM NOT DETECTED NOT DETECTED  Final    Comment: Performed at [facility], [address], [place], [place] [address]  Resp panel by RT-PCR (RSV, Flu A&B, Covid) Anterior Nasal Swab     Status: None   Collection Time: 04/23/24  5:03 PM   Specimen: Anterior Nasal Swab  Result Value Ref Range Status   SARS Coronavirus 2 by RT PCR NEGATIVE NEGATIVE Final   Influenza A by PCR NEGATIVE NEGATIVE Final   Influenza B by PCR NEGATIVE NEGATIVE Final    Comment: (NOTE) The Xpert Xpress SARS-CoV-2/FLU/RSV plus assay is intended as an aid in the diagnosis of influenza from Nasopharyngeal swab specimens and should not be used as a sole basis for treatment. Nasal washings and aspirates are unacceptable for Xpert Xpress SARS-CoV-2/FLU/RSV testing.  Fact Sheet for Patients: BloggerCourse.com  Fact Sheet for Healthcare Providers: SeriousBroker.it  This test is not yet approved or cleared by the [place]  FDA and has been authorized for detection and/or diagnosis of SARS-CoV-2 by FDA under an Emergency Use Authorization (EUA). This EUA will remain in effect (meaning this test can be used) for the duration of the COVID-19 declaration under Section 564(b)(1) of the Act, 21 U.S.C. section 360bbb-3(b)(1), unless the authorization is terminated or revoked.     Resp Syncytial Virus by PCR NEGATIVE NEGATIVE Final    Comment: (NOTE) Fact Sheet for Patients: BloggerCourse.com  Fact Sheet for Healthcare Providers: SeriousBroker.it  This test is not yet approved or cleared by the [place]  FDA and has been authorized for  detection and/or diagnosis of SARS-CoV-2 by FDA under an Emergency Use Authorization (EUA). This EUA will remain in effect (meaning this test can be used) for the duration of the COVID-19 declaration under Section 564(b)(1) of the Act, 21 U.S.C. section 360bbb-3(b)(1), unless the authorization is terminated or revoked.  Performed at [facility], [address] [place], [place] [address]   Blood Culture (routine x 2)     Status: None (Preliminary result)   Collection Time: 04/23/24  6:25 PM   Specimen: BLOOD LEFT HAND  Result Value Ref Range Status   Specimen Description BLOOD LEFT HAND  Final   Special Requests   Final    BOTTLES DRAWN AEROBIC ONLY Blood Culture results may not be optimal due to an inadequate volume of blood received in culture bottles   Culture   Final    NO GROWTH < 12 HOURS Performed at [facility], [address] [place], [place] [address]    Report Status PENDING  Incomplete  MRSA Next Gen by PCR, Nasal     Status: None   Collection Time: 04/24/24  7:52 AM   Specimen: Nasal Mucosa; Nasal Swab  Result Value Ref Range Status   MRSA by PCR Next Gen NOT DETECTED NOT DETECTED Final    Comment: (NOTE) The GeneXpert MRSA Assay (FDA approved for NASAL specimens only), is one component of a comprehensive MRSA colonization surveillance program. It is not intended to diagnose MRSA infection nor to guide or monitor treatment for MRSA infections. Test performance is not FDA approved in patients less than 65 years old. Performed at [facility], [address], [place], [place] [address]      Serology:    Imaging: If present, new imagings (plain films, ct scans, and mri) have been personally visualized and interpreted; radiology reports have been reviewed. Decision making incorporated into the Impression / Recommendations.  7/31 mri sacrum 1. Osteomyelitis involving the lower fifth segment  of the sacrum and adjacent upper coccyx with deep  overlying sacral decubitus ulcer extending essentially to the periosteal margin. 2. Moderate prominence of stool in the rectal vault, cannot exclude fecal impaction.  7/31 mri lumbar spine 1. No evidence of Lumbar Spine infection. See Sacrum MRI reported separately.   2. 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 Chronic lumbar fusion L2 through L5. And degenerative interbody ankylosis at L5-S1.   3. 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   Constance ONEIDA Passer, MD [facility] [phone] pager    04/24/2024, 2:43 PM

## 2024-04-24 NOTE — Plan of Care (Signed)
  Problem: Coping: Goal: Ability to adjust to condition or change in health will improve Outcome: Progressing   Problem: Fluid Volume: Goal: Ability to maintain a balanced intake and output will improve Outcome: Progressing   Problem: Metabolic: Goal: Ability to maintain appropriate glucose levels will improve Outcome: Progressing   Problem: Nutritional: Goal: Maintenance of adequate nutrition will improve Outcome: Progressing Goal: Progress toward achieving an optimal weight will improve Outcome: Progressing   Problem: Tissue Perfusion: Goal: Adequacy of tissue perfusion will improve Outcome: Progressing   Problem: Health Behavior/Discharge Planning: Goal: Ability to manage health-related needs will improve Outcome: Progressing   Problem: Clinical Measurements: Goal: Ability to maintain clinical measurements within normal limits will improve Outcome: Progressing Goal: Respiratory complications will improve Outcome: Progressing Goal: Cardiovascular complication will be avoided Outcome: Progressing   Problem: Coping: Goal: Level of anxiety will decrease Outcome: Progressing   Problem: Elimination: Goal: Will not experience complications related to bowel motility Outcome: Progressing Goal: Will not experience complications related to urinary retention Outcome: Progressing   Problem: Pain Managment: Goal: General experience of comfort will improve and/or be controlled Outcome: Progressing   Problem: Safety: Goal: Ability to remain free from injury will improve Outcome: Progressing   Problem: Skin Integrity: Goal: Risk for impaired skin integrity will decrease Outcome: Progressing

## 2024-04-24 NOTE — Hospital Course (Addendum)
 Brief Narrative:  73 y.o. male with medical history significant of depression, Parkinson's disease, DM 2, GERD, RLS, chronic sacral wound, insomnia, chronic urinary retention with Foley in place comes to the hospital for evaluation of fever.  Patient admitted for severe sepsis secondary to sacral wound infection versus urinary tract infection.  Blood culture started growing GNR.   Assessment & Plan:  Principal Problem:   Sepsis due to cellulitis [facility]) Active Problems:   Parkinson's disease (HCC)   Multiple wounds of skin   Diabetic polyneuropathy associated with type 2 diabetes mellitus (HCC)   Depression   S/P lumbar laminectomy     Severe sepsis secondary to sacral cellulitis/Osteomyelitis.  Gram-negative rods bacteremia -Continue to follow culture data.   Wound care team has been consulted, MRSA nasal swab.  Change antibiotics to vancomycin , Rocephin /Flagyl .  CT scan did not show any deep infection only skin ulceration/cellulitis.  MRI consistent with sacral osteomyelitis. - General Surgery recommending local wound care and pressure displacement otherwise no surgical I&D at this time. - ID-recommending Rocephin /Flagyl /doxycycline .  Final plans depending susceptibility data.  Chronic urinary retention, Foley present POA CAUTI BPH - Chronically wears Foley catheter.  Last changed by urology 7/28.  Foley catheter exchanged again in the hospital 8/1 -Flomax    History of Parkinson disease Dysphagia - Continue Sinemet  -Reportedly on regular diet    Diabetes mellitus type 2 Diabetic peripheral neuropathy - Sliding scale and Accu-Chek.   GERD - PPI   Depression/anxiety - Continue home medications   Insomnia - Continue home medications   Essential hypertension - Nebivolol .  IV as needed  Hypomagnesemia/hypokalemia - As needed repletion   TOC consulted for placement  DVT prophylaxis: Lovenox      Code Status: Full Code Family Communication:  Sister uptodate.  Status  is: Inpatient Remains inpatient appropriate because: Ongoing management for severe sepsis    Subjective: Seen at bedside, no complaints.  Sister present at bedside as well   Examination:  General exam: Appears calm and comfortable  Respiratory system: Clear to auscultation. Respiratory effort normal. Cardiovascular system: S1 & S2 heard, RRR. No JVD, murmurs, rubs, gallops or clicks. No pedal edema. Gastrointestinal system: Abdomen is nondistended, soft and nontender. No organomegaly or masses felt. Normal bowel sounds heard. Central nervous system: Alert and oriented. No focal neurological deficits. Extremities: Symmetric 5 x 5 power. Skin: No rashes, lesions or ulcers Psychiatry: Judgement and insight appear normal. Mood & affect appropriate. Sacral wound with foul-smelling drainage

## 2024-04-24 NOTE — Consult Note (Signed)
 Douglas Price Nov 01, 1950  [id number].    Requesting MD: Dr. Burgess Dare Chief Complaint/Reason for Consult: Sacral wound   HPI: Douglas Price is a 73 y.o. male with a hx of Parkinson's disease, DM, tobacco use, RLS, chronic urinary retention with Foley who presented to the ED 7/31 for fever. Tmax 101.4. WBC 17.3 --> 14.8. Lactic 2.5 --> 2.4 with repeat pending. Workup concerning for source from UTI w/ indwelling foley (ucx pending) for which he was started on abx for. Since admission he has proteus and Enterobacterales positive blood cultures.  He also has a chronic sacral wound. Patient is mostly bedbound. I see notes dating back to June 2025 where a sacral wound was present. CT during this admission showed no fluid collection or abscess near the sacral wound. 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 WOCN saw the patient for this and recommended 1/4% Dakins BID packing and general surgery consult. Patient is not on blood thinners at baseline.   ROS: ROS As above, see hpi  Family History  Problem Relation Age of Onset   Heart failure Mother    Lung cancer Father    Neuropathy Brother    Hypertension Paternal Grandfather    Tuberculosis Maternal Uncle    Cancer - Colon Maternal Uncle     Past Medical History:  Diagnosis Date   Allergic rhinitis due to pollen    Benign neoplasm of colon    Blisters with epidermal loss due to burn (second degree) of foot    Complication of anesthesia    pt woke up during saphenous vein surgery-had epidural   Constipation due to pain medication    Degeneration of lumbar or lumbosacral intervertebral disc    Diverticulosis of colon (without mention of hemorrhage)    DM (diabetes mellitus) (HCC)    Esophageal reflux    Hypertrophy of prostate with urinary obstruction and other lower urinary tract symptoms (LUTS)    Loss of weight     Neuromuscular disorder (HCC)    peripheral neuropathy   Obesity, unspecified    Pain in limb    Restless legs syndrome (RLS)    Sleep disturbance 03/24/2014   Spinal stenosis, unspecified region other than cervical    Tobacco use disorder    Unspecified disease of pericardium    Unspecified essential hypertension    Unspecified local infection of skin and subcutaneous tissue    Varices of other sites     Past Surgical History:  Procedure Laterality Date   BACK SURGERY     CATARACT EXTRACTION Bilateral    one in july and other in Aug.2023   COLONOSCOPY W/ POLYPECTOMY     CYST EXCISION Left 04/09/2022   Procedure: LEFT INDEX FINGER MUCOID CYST EXCISION;  Surgeon: Murrell Drivers, MD;  Location: [facility];  Service: Orthopedics;  Laterality: Left;  Bier block   CYST EXCISION Left 05/02/2023   Procedure: LEFT INDEX FINGER MUCOID CYST EXCISION AND DISTAL INTERPHALANGEAL JOINT DEBRIDEMENT;  Surgeon: Murrell Drivers, MD;  Location: [facility];  Service: Orthopedics;  Laterality: Left;   LUMBAR LAMINECTOMY/DECOMPRESSION MICRODISCECTOMY Bilateral 09/08/2015   Procedure: Laminectomy and Foraminotomy - Lumbar two-lumbar three bilateral;  Surgeon: Alm GORMAN Molt, MD;  Location: [facility];  Service: Neurosurgery;  Laterality: Bilateral;   SAPHENOUS VEIN GRAFT RESECTION Right 09/24/1989   TENDON EXPLORATION Left 04/09/2022   Procedure: DEBRIDEMENT DISTAL INTERPHALANGEAL  JOINT LEFT INDEX FINGER;  Surgeon: Murrell Drivers, MD;  Location: [facility];  Service: Orthopedics;  Laterality: Left;  Bier block   TONSILLECTOMY     TRIGGER FINGER RELEASE Left 05/02/2023   Procedure: LEFT RING FINGER TRIGGER RELEASE;  Surgeon: Murrell Drivers, MD;  Location: [facility];  Service: Orthopedics;  Laterality: Left;    Social History:  reports that he has been smoking pipe. He has never used smokeless tobacco. He reports that he does not drink alcohol and does not use  drugs.  Allergies:  Allergies  Allergen Reactions   Avelox [Moxifloxacin] Other (See Comments)    Angioedema Fatigue   Quinolones Other (See Comments)    Angioedema   Lexapro [Escitalopram] Other (See Comments)    unknown    Medications Prior to Admission  Medication Sig Dispense Refill   buPROPion  (WELLBUTRIN  XL) 300 MG 24 hr tablet TAKE 1 TABLET(300 MG) BY MOUTH DAILY (Patient taking differently: Take 300 mg by mouth daily. TAKE 1 TABLET(300 MG) BY MOUTH DAILY) 30 tablet 0   carbidopa -levodopa  (SINEMET  IR) 25-100 MG tablet Take 1 tablet by mouth 3 (three) times daily.     DULoxetine  (CYMBALTA ) 60 MG capsule Take 120 mg by mouth daily.      dutasteride  (AVODART ) 0.5 MG capsule Take 0.5 mg by mouth daily.     HYDROcodone -acetaminophen  (NORCO/VICODIN) 5-325 MG tablet Take 1 tablet by mouth every 6 (six) hours as needed for moderate pain (pain score 4-6). 1-2 tabs PO q6 hours prn pain (Patient taking differently: Take 1 tablet by mouth in the morning, at noon, and at bedtime.) 60 tablet 0   lithium  carbonate 150 MG capsule TAKE 1 CAPSULE(150 MG) BY MOUTH EVERY EVENING 30 capsule 0   loratadine (CLARITIN) 10 MG tablet Take 10 mg by mouth daily as needed for allergies, rhinitis or itching.     metFORMIN  (GLUCOPHAGE ) 500 MG tablet Take 1,000 mg by mouth 2 (two) times daily with a meal.     mirtazapine  (REMERON ) 15 MG tablet Take 15 mg by mouth at bedtime.     nebivolol  (BYSTOLIC ) 2.5 MG tablet Take 2.5 mg by mouth daily.     omeprazole (PRILOSEC) 20 MG capsule Take 20 mg by mouth 2 (two) times daily before a meal.      polyethylene glycol (MIRALAX / GLYCOLAX) 17 g packet Take 17 g by mouth daily.     pregabalin  (LYRICA ) 75 MG capsule Take 1 capsule (75 mg total) by mouth 2 (two) times daily. 20 capsule 0   tamsulosin  (FLOMAX ) 0.4 MG CAPS capsule Take 0.4 mg by mouth daily.     telmisartan  (MICARDIS ) 40 MG tablet Take 40 mg by mouth daily.     temazepam  (RESTORIL ) 15 MG capsule Take 1  capsule (15 mg total) by mouth at bedtime as needed for sleep. (Patient taking differently: Take 15 mg by mouth at bedtime.) 10 capsule 0     Physical Exam: Blood pressure 113/66, pulse 74, temperature 98.2 F (36.8 C), resp. rate 16, height 6' 3 (1.905 m), weight 94.2 kg, SpO2 94%. General: pleasant, WD/WN male who is laying in bed in NAD HEENT: head is normocephalic, atraumatic.  Sclera are non-icteric.  Lungs: Respiratory effort nonlabored Sacral wound: Seen with RN, Olena Pouch.  See picture below.  Sacral wound noted with 6 x 4 cm opening with mostly healthy granulation tissue with some mixed fibrinous tissue at the base.  There is some palpable bone along the inferior aspect  of the wound with overlying periosteum and some darker tissue however I think this is adherent and not amenable to debridement.  Wound without significant tunneling or tracking.  No drainage.  Periwound with some moisture/pressure changes but no evidence of cellulitis.  No induration, fluctuance or crepitus.         Results for orders placed or performed during the hospital encounter of 04/23/24 (from the past 48 hours)  CBG monitoring, ED     Status: Abnormal   Collection Time: 04/23/24  2:46 PM  Result Value Ref Range   Glucose-Capillary 113 (H) 70 - 99 mg/dL    Comment: Glucose reference range applies only to samples taken after fasting for at least 8 hours.  Blood Culture (routine x 2)     Status: None (Preliminary result)   Collection Time: 04/23/24  2:52 PM   Specimen: BLOOD  Result Value Ref Range   Specimen Description BLOOD SITE NOT SPECIFIED    Special Requests      BOTTLES DRAWN AEROBIC AND ANAEROBIC Blood Culture adequate volume   Culture  Setup Time      GRAM NEGATIVE RODS ANAEROBIC BOTTLE ONLY CRITICAL RESULT CALLED TO, READ BACK BY AND VERIFIED WITH: MAYA MAFFUCCI [id number] FCP Performed at [facility], [address] [place], [place] [address]    Culture GRAM NEGATIVE RODS     Report Status PENDING   Blood Culture ID Panel (Reflexed)     Status: Abnormal   Collection Time: 04/23/24  2:52 PM  Result Value Ref Range   Enterococcus faecalis NOT DETECTED NOT DETECTED   Enterococcus Faecium NOT DETECTED NOT DETECTED   Listeria monocytogenes NOT DETECTED NOT DETECTED   Staphylococcus species NOT DETECTED NOT DETECTED   Staphylococcus aureus (BCID) NOT DETECTED NOT DETECTED   Staphylococcus epidermidis NOT DETECTED NOT DETECTED   Staphylococcus lugdunensis NOT DETECTED NOT DETECTED   Streptococcus species NOT DETECTED NOT DETECTED   Streptococcus agalactiae NOT DETECTED NOT DETECTED   Streptococcus pneumoniae NOT DETECTED NOT DETECTED   Streptococcus pyogenes NOT DETECTED NOT DETECTED   A.calcoaceticus-baumannii NOT DETECTED NOT DETECTED   Bacteroides fragilis NOT DETECTED NOT DETECTED   Enterobacterales DETECTED (A) NOT DETECTED    Comment: Enterobacterales represent a large order of gram negative bacteria, not a single organism. CRITICAL RESULT CALLED TO, READ BACK BY AND VERIFIED WITH: PHARMD BLAKE [id number] FCP    Enterobacter cloacae complex NOT DETECTED NOT DETECTED   Escherichia coli NOT DETECTED NOT DETECTED   Klebsiella aerogenes NOT DETECTED NOT DETECTED   Klebsiella oxytoca NOT DETECTED NOT DETECTED   Klebsiella pneumoniae NOT DETECTED NOT DETECTED   Proteus species DETECTED (A) NOT DETECTED    Comment: CRITICAL RESULT CALLED TO, READ BACK BY AND VERIFIED WITH: PHARMD BLAKE [id number] 0722 FCP    Salmonella species NOT DETECTED NOT DETECTED   Serratia marcescens NOT DETECTED NOT DETECTED   Haemophilus influenzae NOT DETECTED NOT DETECTED   Neisseria meningitidis NOT DETECTED NOT DETECTED   Pseudomonas aeruginosa NOT DETECTED NOT DETECTED   Stenotrophomonas maltophilia NOT DETECTED NOT DETECTED   Candida albicans NOT DETECTED NOT DETECTED   Candida auris NOT DETECTED NOT DETECTED   Candida glabrata NOT DETECTED NOT DETECTED   Candida krusei  NOT DETECTED NOT DETECTED   Candida parapsilosis NOT DETECTED NOT DETECTED   Candida tropicalis NOT DETECTED NOT DETECTED   Cryptococcus neoformans/gattii NOT DETECTED NOT DETECTED   CTX-M ESBL NOT DETECTED NOT DETECTED   Carbapenem resistance IMP NOT DETECTED  NOT DETECTED   Carbapenem resistance KPC NOT DETECTED NOT DETECTED   Carbapenem resistance NDM NOT DETECTED NOT DETECTED   Carbapenem resist OXA 48 LIKE NOT DETECTED NOT DETECTED   Carbapenem resistance VIM NOT DETECTED NOT DETECTED    Comment: Performed at [facility], [address] [place], [place] [address]  Comprehensive metabolic panel     Status: Abnormal   Collection Time: 04/23/24  3:32 PM  Result Value Ref Range   Sodium 135 135 - 145 mmol/L   Potassium 4.2 3.5 - 5.1 mmol/L   Chloride 100 98 - 111 mmol/L   CO2 23 22 - 32 mmol/L   Glucose, Bld 116 (H) 70 - 99 mg/dL    Comment: Glucose reference range applies only to samples taken after fasting for at least 8 hours.   BUN 22 8 - 23 mg/dL   Creatinine, Ser 9.29 0.61 - 1.24 mg/dL   Calcium 8.7 (L) 8.9 - 10.3 mg/dL   Total Protein 6.5 6.5 - 8.1 g/dL   Albumin  2.6 (L) 3.5 - 5.0 g/dL   AST 18 15 - 41 U/L   ALT 16 0 - 44 U/L   Alkaline Phosphatase 65 38 - 126 U/L   Total Bilirubin 0.7 0.0 - 1.2 mg/dL   GFR, Estimated >39 >39 mL/min    Comment: (NOTE) Calculated using the CKD-EPI Creatinine Equation (2021)    Anion gap 12 5 - 15    Comment: Performed at [facility], [address] [place], [place] [address]  CBC with Differential     Status: Abnormal   Collection Time: 04/23/24  3:32 PM  Result Value Ref Range   WBC 20.6 (H) 4.0 - 10.5 K/uL   RBC 3.97 (L) 4.22 - 5.81 MIL/uL   Hemoglobin 11.4 (L) 13.0 - 17.0 g/dL   HCT 63.8 (L) 60.9 - 47.9 %   MCV 90.9 80.0 - 100.0 fL   MCH 28.7 26.0 - 34.0 pg   MCHC 31.6 30.0 - 36.0 g/dL   RDW 86.2 88.4 - 84.4 %   Platelets 424 (H) 150 - 400 K/uL   nRBC 0.0 0.0 - 0.2 %   Neutrophils Relative % 88 %   Neutro Abs  18.0 (H) 1.7 - 7.7 K/uL   Lymphocytes Relative 3 %   Lymphs Abs 0.7 0.7 - 4.0 K/uL   Monocytes Relative 7 %   Monocytes Absolute 1.5 (H) 0.1 - 1.0 K/uL   Eosinophils Relative 1 %   Eosinophils Absolute 0.2 0.0 - 0.5 K/uL   Basophils Relative 0 %   Basophils Absolute 0.1 0.0 - 0.1 K/uL   Immature Granulocytes 1 %   Abs Immature Granulocytes 0.10 (H) 0.00 - 0.07 K/uL    Comment: Performed at [facility], [address], [place], [place] [address]  Protime-INR     Status: Abnormal   Collection Time: 04/23/24  3:32 PM  Result Value Ref Range   Prothrombin Time 16.0 (H) 11.4 - 15.2 seconds   INR 1.2 0.8 - 1.2    Comment: (NOTE) INR goal varies based on device and disease states. Performed at [facility], [address] [place], [place] [address]   Urinalysis, w/ Reflex to Culture (Infection Suspected) -Urine, Unspecified Source     Status: Abnormal   Collection Time: 04/23/24  3:32 PM  Result Value Ref Range   Specimen Source URINE, UNSPE    Color, Urine YELLOW YELLOW   APPearance CLEAR CLEAR  Specific Gravity, Urine >1.046 (H) 1.005 - 1.030   pH 7.0 5.0 - 8.0   Glucose, UA NEGATIVE NEGATIVE mg/dL   Hgb urine dipstick NEGATIVE NEGATIVE   Bilirubin Urine NEGATIVE NEGATIVE   Ketones, ur NEGATIVE NEGATIVE mg/dL   Protein, ur NEGATIVE NEGATIVE mg/dL   Nitrite POSITIVE (A) NEGATIVE   Leukocytes,Ua MODERATE (A) NEGATIVE   RBC / HPF 11-20 0 - 5 RBC/hpf   WBC, UA 21-50 0 - 5 WBC/hpf    Comment:        Reflex urine culture not performed if WBC <=10, OR if Squamous epithelial cells >5. If Squamous epithelial cells >5 suggest recollection.    Bacteria, UA MANY (A) NONE SEEN   Squamous Epithelial / HPF 0-5 0 - 5 /HPF   WBC Clumps PRESENT    Mucus PRESENT     Comment: Performed at [facility], [address], [place], [place] [address]  I-Stat Lactic Acid, ED     Status: Abnormal   Collection Time: 04/23/24  3:37 PM  Result Value Ref Range   Lactic Acid,  Venous 2.5 (HH) 0.5 - 1.9 mmol/L   Comment NOTIFIED PHYSICIAN   CBC     Status: Abnormal   Collection Time: 04/23/24  4:43 PM  Result Value Ref Range   WBC 17.3 (H) 4.0 - 10.5 K/uL   RBC 3.30 (L) 4.22 - 5.81 MIL/uL   Hemoglobin 9.5 (L) 13.0 - 17.0 g/dL   HCT 69.5 (L) 60.9 - 47.9 %   MCV 92.1 80.0 - 100.0 fL   MCH 28.8 26.0 - 34.0 pg   MCHC 31.3 30.0 - 36.0 g/dL   RDW 86.2 88.4 - 84.4 %   Platelets 357 150 - 400 K/uL   nRBC 0.0 0.0 - 0.2 %    Comment: Performed at [facility], [address] [place], [place] [address]  Creatinine, serum     Status: None   Collection Time: 04/23/24  4:43 PM  Result Value Ref Range   Creatinine, Ser 0.80 0.61 - 1.24 mg/dL   GFR, Estimated >39 >39 mL/min    Comment: (NOTE) Calculated using the CKD-EPI Creatinine Equation (2021) Performed at [facility], [address] [place], [place] [address]   Resp panel by RT-PCR (RSV, Flu A&B, Covid) Anterior Nasal Swab     Status: None   Collection Time: 04/23/24  5:03 PM   Specimen: Anterior Nasal Swab  Result Value Ref Range   SARS Coronavirus 2 by RT PCR NEGATIVE NEGATIVE   Influenza A by PCR NEGATIVE NEGATIVE   Influenza B by PCR NEGATIVE NEGATIVE    Comment: (NOTE) The Xpert Xpress SARS-CoV-2/FLU/RSV plus assay is intended as an aid in the diagnosis of influenza from Nasopharyngeal swab specimens and should not be used as a sole basis for treatment. Nasal washings and aspirates are unacceptable for Xpert Xpress SARS-CoV-2/FLU/RSV testing.  Fact Sheet for Patients: BloggerCourse.com  Fact Sheet for Healthcare Providers: SeriousBroker.it  This test is not yet approved or cleared by the [place]  FDA and has been authorized for detection and/or diagnosis of SARS-CoV-2 by FDA under an Emergency Use Authorization (EUA). This EUA will remain in effect (meaning this test can be used) for the duration of the COVID-19 declaration  under Section 564(b)(1) of the Act, 21 U.S.C. section 360bbb-3(b)(1), unless the authorization is terminated or revoked.     Resp Syncytial Virus by PCR NEGATIVE NEGATIVE    Comment: (NOTE) Fact Sheet for Patients:  BloggerCourse.com  Fact Sheet for Healthcare Providers: SeriousBroker.it  This test is not yet approved or cleared by the [place]  FDA and has been authorized for detection and/or diagnosis of SARS-CoV-2 by FDA under an Emergency Use Authorization (EUA). This EUA will remain in effect (meaning this test can be used) for the duration of the COVID-19 declaration under Section 564(b)(1) of the Act, 21 U.S.C. section 360bbb-3(b)(1), unless the authorization is terminated or revoked.  Performed at [facility], [address], [place], [place] [address]   I-Stat CG4 Lactic Acid     Status: Abnormal   Collection Time: 04/23/24  5:26 PM  Result Value Ref Range   Lactic Acid, Venous 2.4 (HH) 0.5 - 1.9 mmol/L   Comment NOTIFIED PHYSICIAN   Blood Culture (routine x 2)     Status: None (Preliminary result)   Collection Time: 04/23/24  6:25 PM   Specimen: BLOOD LEFT HAND  Result Value Ref Range   Specimen Description BLOOD LEFT HAND    Special Requests      BOTTLES DRAWN AEROBIC ONLY Blood Culture results may not be optimal due to an inadequate volume of blood received in culture bottles   Culture      NO GROWTH < 12 HOURS Performed at [facility], [address], [place], [place] [address]    Report Status PENDING   Glucose, capillary     Status: Abnormal   Collection Time: 04/23/24 10:37 PM  Result Value Ref Range   Glucose-Capillary 105 (H) 70 - 99 mg/dL    Comment: Glucose reference range applies only to samples taken after fasting for at least 8 hours.  Glucose, capillary     Status: None   Collection Time: 04/24/24  5:15 AM  Result Value Ref Range   Glucose-Capillary 85 70 - 99 mg/dL    Comment:  Glucose reference range applies only to samples taken after fasting for at least 8 hours.  Glucose, capillary     Status: None   Collection Time: 04/24/24  5:56 AM  Result Value Ref Range   Glucose-Capillary 92 70 - 99 mg/dL    Comment: Glucose reference range applies only to samples taken after fasting for at least 8 hours.  MRSA Next Gen by PCR, Nasal     Status: None   Collection Time: 04/24/24  7:52 AM   Specimen: Nasal Mucosa; Nasal Swab  Result Value Ref Range   MRSA by PCR Next Gen NOT DETECTED NOT DETECTED    Comment: (NOTE) The GeneXpert MRSA Assay (FDA approved for NASAL specimens only), is one component of a comprehensive MRSA colonization surveillance program. It is not intended to diagnose MRSA infection nor to guide or monitor treatment for MRSA infections. Test performance is not FDA approved in patients less than 57 years old. Performed at [facility], [address], [place], [place] [address]   Basic metabolic panel with GFR     Status: Abnormal   Collection Time: 04/24/24  9:19 AM  Result Value Ref Range   Sodium 132 (L) 135 - 145 mmol/L   Potassium 3.5 3.5 - 5.1 mmol/L   Chloride 100 98 - 111 mmol/L   CO2 21 (L) 22 - 32 mmol/L   Glucose, Bld 112 (H) 70 - 99 mg/dL    Comment: Glucose reference range applies only to samples taken after fasting for at least 8 hours.   BUN 19 8 - 23 mg/dL   Creatinine, Ser 9.30 0.61 -  1.24 mg/dL   Calcium 8.0 (L) 8.9 - 10.3 mg/dL   GFR, Estimated >39 >39 mL/min    Comment: (NOTE) Calculated using the CKD-EPI Creatinine Equation (2021)    Anion gap 11 5 - 15    Comment: Performed at [facility], [address], [place], [place] [address]  CBC     Status: Abnormal   Collection Time: 04/24/24  9:19 AM  Result Value Ref Range   WBC 14.8 (H) 4.0 - 10.5 K/uL   RBC 3.37 (L) 4.22 - 5.81 MIL/uL   Hemoglobin 9.8 (L) 13.0 - 17.0 g/dL   HCT 69.4 (L) 60.9 - 47.9 %   MCV 90.5 80.0 - 100.0 fL   MCH 29.1 26.0 - 34.0 pg    MCHC 32.1 30.0 - 36.0 g/dL   RDW 86.1 88.4 - 84.4 %   Platelets 350 150 - 400 K/uL   nRBC 0.0 0.0 - 0.2 %    Comment: Performed at [facility], [address] [place], [place] [address]  Magnesium     Status: Abnormal   Collection Time: 04/24/24  9:19 AM  Result Value Ref Range   Magnesium 1.3 (L) 1.7 - 2.4 mg/dL    Comment: Performed at [facility], [address], [place], [place] [address]  Phosphorus     Status: None   Collection Time: 04/24/24  9:19 AM  Result Value Ref Range   Phosphorus 3.9 2.5 - 4.6 mg/dL    Comment: Performed at [facility], [address] [place], [place] [address]  Glucose, capillary     Status: None   Collection Time: 04/24/24 11:48 AM  Result Value Ref Range   Glucose-Capillary 95 70 - 99 mg/dL    Comment: Glucose reference range applies only to samples taken after fasting for at least 8 hours.   MR Lumbar Spine W Wo Contrast Result Date: 04/24/2024 CLINICAL DATA:  73 year old male with chronic myelopathy. Query spinal infection. EXAM: MRI LUMBAR SPINE WITHOUT AND WITH CONTRAST TECHNIQUE: Multiplanar and multiecho pulse sequences of the lumbar spine were obtained without and with intravenous contrast. CONTRAST:  9mL GADAVIST  GADOBUTROL  1 MMOL/ML IV SOLN COMPARISON:  Sacral MRI the same day reported separately. Lumbar MRI 10/31/2018. CT Abdomen and Pelvis 04/23/2024. FINDINGS: Segmentation: Normal on the CT yesterday which is the same numbering system used on the 2020 MRI. Alignment: Lumbar lordosis with chronic grade 1 anterolisthesis of L4 on L5. Underlying levoconvex lumbar scoliosis is moderate, better demonstrated on the recent CT. Degenerative appearing retrolisthesis of L1 on L2 is chronic but progressed since 2020. Vertebrae: Chronic postoperative changes with hardware susceptibility artifact L2 through L5. Normal background bone marrow signal. CT evidence of solid arthrodesis through those levels, and also degenerative interbody  ankylosis at L5-S1. Visible lower thoracic levels and L1 vertebrae appear intact with maintained height. L1 benign vertebral body hemangioma (normal variant). Sacrum is described separately. No lower thoracic or lumbar marrow edema or acute osseous abnormality. Conus medullaris and cauda equina: Conus extends to the T12-L1 level. No lower spinal cord or conus signal abnormality. Fatty filum terminalis, normal variant. No abnormal intradural enhancement. No dural thickening. Paraspinal and other soft tissues: Postoperative changes to the posterior lumbar paraspinal soft tissues, no change since 2020. No lumbar paraspinal fluid collection. Stable visible abdominal viscera. Disc levels: T11-T12 and T12-L1: Negative, and interbody ankylosis at those levels from Diffuse idiopathic skeletal hyperostosis (DISH). Demonstrated on CT yesterday. L1-L2: Severe disc space loss with  vacuum disc by CT. Mild retrolisthesis, chronic but increased since 2020. Moderate to severe multifactorial spinal stenosis (series 8, image 13 and series 5, image 9. Moderate left and moderate to severe right L1 foraminal stenosis. This level has progressed since 2020. L2-L3:  Decompression and fusion with no adverse features. L3-L4:  Decompression and fusion with no adverse features. L4-L5: Decompression and fusion. Chronic severe right side osseous L4 foraminal stenosis, stable since 2020. L5-S1: Interbody ankylosis. Bulky circumferential disc osteophyte complex. No spinal stenosis. Mild to moderate chronic left lateral recess stenosis is stable since 2020. Moderate left greater than right L5 foraminal stenosis also stable. IMPRESSION: 1. No evidence of Lumbar Spine infection. See Sacrum MRI reported separately. 2. 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 Chronic lumbar fusion L2 through L5. And degenerative interbody ankylosis at L5-S1. 3. 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 Electronically Signed   By: VEAR Hurst M.D.   On: 04/24/2024 09:07   MR SACRUM SI JOINTS W WO CONTRAST Result Date: 04/24/2024 CLINICAL DATA:  Chronic myelopathy, for infection. Parkinson's disease. Chronic sacral wound. Chronic urinary retention. EXAM: MRI SACRUM WITHOUT CONTRAST TECHNIQUE: Multiplanar multi-sequence MR imaging of the sacrum was performed. No intravenous contrast was administered. COMPARISON:  CT pelvis 04/23/2024 FINDINGS: OSSEOUS STRUCTURES AND JOINTS: Abnormal edema signal compatible with osteomyelitis involving the lower fifth segment of the sacrum and upper coccyx as on image 13 series 6 with deep overlying sacral decubitus ulcer extending essentially to the periosteal margin as shown on image 9 series 13. Postoperative findings in the lower lumbar spine, please see dedicated lumbar spine MRI report. Mild spurring of the SI joints but no SI joint effusion. Trace presacral edema. MUSCULOTENDINOUS: Unremarkable SACRAL PLEXUS: No impinging lesion is identified involving the sacral plexus or proximal sciatic nerves. OTHER: Foley catheter in the otherwise empty urinary bladder. Moderate prominence of stool in the rectal vault, cannot exclude fecal impaction. IMPRESSION: 1. Osteomyelitis involving the lower fifth segment of the sacrum and adjacent upper coccyx with deep overlying sacral decubitus ulcer extending essentially to the periosteal margin. 2. Moderate prominence of stool in the rectal vault, cannot exclude fecal impaction. Electronically Signed   By: Ryan Salvage M.D.   On: 04/24/2024 08:29   CT ABDOMEN PELVIS W CONTRAST Result Date: 04/23/2024 CLINICAL DATA:  Sepsis.  Worsening sacral wound. EXAM: CT ABDOMEN AND PELVIS WITH CONTRAST TECHNIQUE: Multidetector CT imaging of the abdomen and pelvis was performed using the standard protocol following bolus administration of intravenous contrast.  RADIATION DOSE REDUCTION: This exam was performed according to the departmental dose-optimization program which includes automated exposure control, adjustment of the mA and/or kV according to patient size and/or use of iterative reconstruction technique. CONTRAST:  75mL OMNIPAQUE  IOHEXOL  350 MG/ML SOLN COMPARISON:  CT abdomen pelvis dated 03/19/2024. FINDINGS: Lower chest: No acute abnormality. No intra-abdominal free air or free fluid. Hepatobiliary: Small liver cysts. No biliary dilatation. The gallbladder is unremarkable Pancreas: Unremarkable. No pancreatic ductal dilatation or surrounding inflammatory changes. Spleen: Normal in size without focal abnormality. Adrenals/Urinary Tract: The adrenal glands unremarkable. Small bilateral renal cysts. There is no hydronephrosis on either side. There is symmetric enhancement and excretion of contrast by both kidneys. The visualized ureters appear unremarkable. The urinary bladder is decompressed around a Foley catheter. Stomach/Bowel: Moderate stool throughout the colon. There is no bowel obstruction or active inflammation. The appendix is not visualized with certainty.  No inflammatory changes identified in the right lower quadrant. Vascular/Lymphatic: Mild aortoiliac atherosclerotic disease. The IVC is unremarkable. No portal venous gas. There is no adenopathy. Reproductive: The prostate and seminal vesicles are grossly unremarkable. No pelvic mass. Other: Small fat containing right inguinal hernia. Interval development of a skin ulcer over the distal sacrum extending deep into the soft tissues to the level of the bone. No definite bone erosion or evidence of acute osteomyelitis by CT. No fluid collection or abscess. Musculoskeletal: Osteopenia with degenerative changes of the spine. Multilevel lumbar fusion. No acute osseous pathology. IMPRESSION: 1. Interval development of a skin ulcer over the distal sacrum. No evidence of acute osteomyelitis by CT. No fluid  collection or abscess. 2. No bowel obstruction. 3.  Aortic Atherosclerosis (ICD10-I70.0). Electronically Signed   By: Vanetta Chou M.D.   On: 04/23/2024 16:49   DG Chest Port 1 View Result Date: 04/23/2024 CLINICAL DATA:  Questionable sepsis - evaluate for abnormality EXAM: PORTABLE CHEST - 1 VIEW COMPARISON:  February 25, 2024 FINDINGS: Elevation of the right hemidiaphragm. No focal airspace consolidation, pleural effusion, or pneumothorax. No cardiomegaly. No acute fracture or destructive lesion. Multilevel degenerative disc disease of the spine. IMPRESSION: No acute cardiopulmonary abnormality. Electronically Signed   By: Rogelia Myers M.D.   On: 04/23/2024 16:06    Anti-infectives (From admission, onward)    Start     Dose/Rate Route Frequency Ordered Stop   04/24/24 1200  cefTRIAXone  (ROCEPHIN ) 2 g in sodium chloride  0.9 % 100 mL IVPB        2 g 200 mL/hr over 30 Minutes Intravenous Every 24 hours 04/24/24 0832     04/24/24 1200  metroNIDAZOLE  (FLAGYL ) IVPB 500 mg        500 mg 100 mL/hr over 60 Minutes Intravenous Every 12 hours 04/24/24 0832     04/24/24 0500  vancomycin  (VANCOREADY) IVPB 1250 mg/250 mL        1,250 mg 166.7 mL/hr over 90 Minutes Intravenous Every 12 hours 04/23/24 1716     04/23/24 2200  piperacillin -tazobactam (ZOSYN ) IVPB 3.375 g  Status:  Discontinued        3.375 g 12.5 mL/hr over 240 Minutes Intravenous Every 8 hours 04/23/24 1716 04/24/24 0832   04/23/24 1600  vancomycin  (VANCOREADY) IVPB 1750 mg/350 mL        1,750 mg 175 mL/hr over 120 Minutes Intravenous  Once 04/23/24 1546 04/23/24 1843   04/23/24 1545  ceFEPIme  (MAXIPIME ) 2 g in sodium chloride  0.9 % 100 mL IVPB        2 g 200 mL/hr over 30 Minutes Intravenous  Once 04/23/24 1532 04/23/24 1627   04/23/24 1545  metroNIDAZOLE  (FLAGYL ) IVPB 500 mg        500 mg 100 mL/hr over 60 Minutes Intravenous  Once 04/23/24 1532 04/23/24 1745   04/23/24 1545  vancomycin  (VANCOCIN ) IVPB 1000 mg/200 mL premix   Status:  Discontinued        1,000 mg 200 mL/hr over 60 Minutes Intravenous  Once 04/23/24 1532 04/23/24 1546       Assessment/Plan Sacral wound  - No indication for surgical debridement at this time - Continue local wound care as outlined by WOC - Recommend pressure displacement with frequent turns, geomat and air mattress - Abx per ID for osteomyelitis - General surgery will sign off. Please call back with questions or concerns.   I reviewed nursing notes, Consultant (ID) notes, hospitalist notes, last 24 h vitals and pain scores, last  48 h intake and output, last 24 h labs and trends, and last 24 h imaging results.  Ozell CHRISTELLA Shaper, [facility] Surgery 04/24/2024, 11:51 AM Please see Amion for pager number during day hours 7:00am-4:30pm

## 2024-04-25 DIAGNOSIS — L0390 Cellulitis, unspecified: Secondary | ICD-10-CM | POA: Diagnosis not present

## 2024-04-25 DIAGNOSIS — A419 Sepsis, unspecified organism: Secondary | ICD-10-CM | POA: Diagnosis not present

## 2024-04-25 LAB — BASIC METABOLIC PANEL WITH GFR
Anion gap: 9 (ref 5–15)
BUN: 13 mg/dL (ref 8–23)
CO2: 23 mmol/L (ref 22–32)
Calcium: 8.1 mg/dL — ABNORMAL LOW (ref 8.9–10.3)
Chloride: 103 mmol/L (ref 98–111)
Creatinine, Ser: 0.78 mg/dL (ref 0.61–1.24)
GFR, Estimated: >60 mL/min (ref >60)
Glucose, Bld: 114 mg/dL — ABNORMAL HIGH (ref 70–99)
Potassium: 3.6 mmol/L (ref 3.5–5.1)
Sodium: 135 mmol/L (ref 135–145)

## 2024-04-25 LAB — CBC
HCT: 30.8 % — ABNORMAL LOW (ref 39.0–52.0)
Hemoglobin: 10 g/dL — ABNORMAL LOW (ref 13.0–17.0)
MCH: 29.1 pg (ref 26.0–34.0)
MCHC: 32.5 g/dL (ref 30.0–36.0)
MCV: 89.5 fL (ref 80.0–100.0)
Platelets: 376 K/uL (ref 150–400)
RBC: 3.44 MIL/uL — ABNORMAL LOW (ref 4.22–5.81)
RDW: 13.7 % (ref 11.5–15.5)
WBC: 14.3 K/uL — ABNORMAL HIGH (ref 4.0–10.5)
nRBC: 0 % (ref 0.0–0.2)

## 2024-04-25 LAB — GLUCOSE, CAPILLARY
Glucose-Capillary: 117 mg/dL — ABNORMAL HIGH (ref 70–99)
Glucose-Capillary: 116 mg/dL — ABNORMAL HIGH (ref 70–99)
Glucose-Capillary: 152 mg/dL — ABNORMAL HIGH (ref 70–99)
Glucose-Capillary: 130 mg/dL — ABNORMAL HIGH (ref 70–99)

## 2024-04-25 LAB — MAGNESIUM: Magnesium: 1.7 mg/dL (ref 1.7–2.4)

## 2024-04-25 MED ORDER — HYDROCORTISONE 1 % EX CREA
TOPICAL_CREAM | Freq: Two times a day (BID) | CUTANEOUS | Status: DC
  Filled 2024-04-25 (×3): qty 28

## 2024-04-25 NOTE — Evaluation (Signed)
 Physical Therapy Evaluation Patient Details Name: Douglas Price MRN: [id number] DOB: [birth year] Today's Date: 04/25/2024  History of Present Illness  Douglas Price is a 73 y.o. male admitted 04/23/24 for severe sepsis secondary to sacral wound infection vs. UTI. 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 PMHx: depression, Parkinson's disease, T2DM, polyneuropathy, GERD, RLS, chronic sacral wound, insomnia, and chronic urinary retention with Foley in place.   Clinical Impression  Pt admitted with above diagnosis. PTA, pt was residing at [facility] receiving therapy services. He required +2 assist for the majority of functional mobility and relied on a hoyer lift to transfer. Pt reports he is able to propel the manual w/c with bilateral feet and LUE. He relies on staff for the majority of ADLs and all IADLs. Pt currently with functional limitations due to the deficits listed below (see PT Problem List). He required min-modA x2 for bed mobility. Pt maintained seated balance with LUE support and minA, able to progress to close supervision, and then required CGA as he fatigued. Pt will benefit from acute skilled PT to increase their independence and safety with mobility to allow discharge. Recommend continued inpatient follow up therapy, <3 hours/day.     If plan is discharge home, recommend the following: Two people to help with walking and/or transfers;Two people to help with bathing/dressing/bathroom;Assistance with cooking/housework;Assist for transportation;Help with stairs or ramp for entrance   Can travel by private vehicle   No    Equipment Recommendations None recommended by PT  Recommendations for Other Services       Functional Status Assessment Patient has had a recent decline in their functional status and demonstrates the ability to make significant improvements in function in a  reasonable and predictable amount of time.     Precautions / Restrictions Precautions Precautions: Fall;Other (comment) (Skin Integrity) Recall of Precautions/Restrictions: Impaired Precaution/Restrictions Comments: Educated pt on the importance of changing positions throughout the day in order to offload. Restrictions Weight Bearing Restrictions Per Provider Order: No      Mobility  Bed Mobility Overal bed mobility: Needs Assistance Bed Mobility: Supine to Sit, Sit to Supine     Supine to sit: Min assist, Mod assist, +2 for physical assistance, HOB elevated, Used rails Sit to supine: Min assist, Mod assist, +2 for physical assistance, HOB elevated   General bed mobility comments: Pt sat up on L side of bed with increased time. He slowly brought BLE off EOB, required minA for the final bit. HOB elevated and pt pulled on bedrail, required min-modA to elevate trunk. Cues for sequencing. Pt able to place LUE on bed to aid in propping himself up. Returning to bed pt controlled trunk and required modA to bring BLE back into bed. Repositioned using bed pad and bed features.    Transfers Overall transfer level: Needs assistance                 General transfer comment: Deferred d/t fatigue. Pt has been using hoyer lift.    Ambulation/Gait                  Stairs            Wheelchair Mobility     Tilt Bed    Modified Rankin (Stroke Patients Only)       Balance Overall balance assessment: Needs assistance Sitting-balance support: Single extremity supported, Feet supported Sitting balance-Leahy Scale: Fair Sitting  balance - Comments: Pt initially required minA to maintain seated balance. Able to lessen to close supervision and then increased to CGA as pt fatigued.                                     Pertinent Vitals/Pain Pain Assessment Pain Assessment: 0-10 Pain Score: 7  Pain Location: sacrum Pain Descriptors / Indicators:  Discomfort, Grimacing, Aching, Sore Pain Intervention(s): Premedicated before session, Monitored during session, Limited activity within patient's tolerance, Repositioned    Home Living Family/patient expects to be discharged to:: Skilled nursing facility [facility]                   Additional Comments: Pt has been recieving PT/OT services.    Prior Function Prior Level of Function : Needs assist       Physical Assist : Mobility (physical);ADLs (physical) Mobility (physical): Bed mobility;Transfers;Gait;Stairs ADLs (physical): Grooming;Bathing;Dressing;Toileting;IADLs Mobility Comments: Pt reports he requires +2 assist from facility staff with mobility. He is usually hoyer lifted to manual w/c. Pt states he can propel himself using B feet and LUE. His R hand is unable to grasp the rim to assist in pushing. Pt reports he has been working on transfers in therapy. ADLs Comments: Pt reports he requires +2 assist from facility staff with ADLs and relies on staff for all IADLs. He is able to feed himself.     Extremity/Trunk Assessment   Upper Extremity Assessment Upper Extremity Assessment: RUE deficits/detail;LUE deficits/detail;Right hand dominant RUE Deficits / Details: Pt with limited AROM globally. His hand rests in flexion with noted finger cross over. Pt reports wearing a resting hand splint at night. He demonstrated poor grip stregth. Able to straighten fingers and achieve wrist neutral passively, but pt c/o pain. PROM limited by pain. Grossly 2/5. RUE: Unable to fully assess due to pain;Shoulder pain with ROM RUE Sensation: history of peripheral neuropathy RUE Coordination: decreased fine motor;decreased gross motor LUE Deficits / Details: Pt with limited AROM globally. Grossly 2/5. LUE: Unable to fully assess due to pain;Shoulder pain with ROM LUE Sensation: history of peripheral neuropathy LUE Coordination: decreased fine motor;decreased gross motor    Lower  Extremity Assessment Lower Extremity Assessment: RLE deficits/detail;LLE deficits/detail RLE Deficits / Details: Pt with limited AROM globally. Noted posterior chain tightness. PROM limited by pain and gaurding. Grossly 2/5 strength. RLE: Unable to fully assess due to pain RLE Sensation: history of peripheral neuropathy RLE Coordination: decreased gross motor LLE Deficits / Details: Pt with limited AROM globally. Noted posterior chain tightness. PROM limited by pain and gaurding. Grossly 2/5 strength. LLE: Unable to fully assess due to pain LLE Sensation: history of peripheral neuropathy LLE Coordination: decreased gross motor    Cervical / Trunk Assessment Cervical / Trunk Assessment: Kyphotic  Communication   Communication Communication: No apparent difficulties    Cognition Arousal: Alert Behavior During Therapy: WFL for tasks assessed/performed   PT - Cognitive impairments: No apparent impairments                       PT - Cognition Comments: Pt A,Ox4. He took increased time to complete tasks, stating let me try to do it myself and required moderate encouragement to participate in session. Following commands: Intact       Cueing Cueing Techniques: Verbal cues, Gestural cues     General Comments General comments (skin integrity, edema, etc.): Pt  diaphroetic with patchy redness along back. Sister present and supportive during session.    Exercises     Assessment/Plan    PT Assessment Patient needs continued PT services  PT Problem List Decreased strength;Decreased range of motion;Decreased activity tolerance;Decreased balance;Decreased mobility;Decreased safety awareness;Decreased skin integrity       PT Treatment Interventions DME instruction;Functional mobility training;Therapeutic activities;Therapeutic exercise;Balance training;Neuromuscular re-education;Patient/family education;Wheelchair mobility training    PT Goals (Current goals can be found in  the Care Plan section)  Acute Rehab PT Goals Patient Stated Goal: Continue to heal and get stronger PT Goal Formulation: With patient/family Time For Goal Achievement: 05/09/24 Potential to Achieve Goals: Fair    Frequency Min 2X/week     Co-evaluation               AM-PAC PT 6 Clicks Mobility  Outcome Measure Help needed turning from your back to your side while in a flat bed without using bedrails?: Total Help needed moving from lying on your back to sitting on the side of a flat bed without using bedrails?: Total Help needed moving to and from a bed to a chair (including a wheelchair)?: Total Help needed standing up from a chair using your arms (e.g., wheelchair or bedside chair)?: Total Help needed to walk in hospital room?: Total Help needed climbing 3-5 steps with a railing? : Total 6 Click Score: 6    End of Session   Activity Tolerance: Patient tolerated treatment well;Patient limited by fatigue Patient left: in bed;with call bell/phone within reach;with family/visitor present Nurse Communication: Mobility status;Need for lift equipment;Other (comment) (Maximove to transfer pt; Need for prevalon boots) PT Visit Diagnosis: Muscle weakness (generalized) (M62.81);Other abnormalities of gait and mobility (R26.89);Unsteadiness on feet (R26.81)    Time: 1400-1430 PT Time Calculation (min) (ACUTE ONLY): 30 min   Charges:   PT Evaluation $PT Eval Moderate Complexity: 1 Mod   PT General Charges $$ ACUTE PT VISIT: 1 Visit         Randall SAUNDERS, PT, DPT Acute Rehabilitation Services Office: [phone] Secure Chat Preferred  Delon CHRISTELLA Callander 04/25/2024, 3:28 PM

## 2024-04-25 NOTE — Progress Notes (Signed)
 PROGRESS NOTE    Douglas Price  FMW:[id number] DOB: 29-Nov-1950 DOA: 04/23/2024 PCP: Janey Santos, MD    Brief Narrative:  73 y.o. male with medical history significant of depression, Parkinson's disease, DM 2, GERD, RLS, chronic sacral wound, insomnia, chronic urinary retention with Foley in place comes to the hospital for evaluation of fever.  Patient admitted for severe sepsis secondary to sacral wound infection versus urinary tract infection.  Blood culture started growing GNR.   Assessment & Plan:  Principal Problem:   Sepsis due to cellulitis [facility]) Active Problems:   Parkinson's disease (HCC)   Multiple wounds of skin   Diabetic polyneuropathy associated with type 2 diabetes mellitus (HCC)   Depression   S/P lumbar laminectomy     Severe sepsis secondary to sacral cellulitis/Osteomyelitis.  Gram-negative rods bacteremia -Continue to follow culture data.   Wound care team has been consulted, MRSA nasal swab.  Change antibiotics to vancomycin , Rocephin /Flagyl .  CT scan did not show any deep infection only skin ulceration/cellulitis.  MRI consistent with sacral osteomyelitis. - General Surgery recommending local wound care and pressure displacement otherwise no surgical I&D at this time. - ID-recommending Rocephin /Flagyl /doxycycline .  Final plans depending susceptibility data.  Chronic urinary retention, Foley present POA CAUTI BPH - Chronically wears Foley catheter.  Last changed by urology 7/28.  Foley catheter exchanged again in the hospital 8/1 -Flomax    History of Parkinson disease Dysphagia - Continue Sinemet  -Reportedly on regular diet    Diabetes mellitus type 2 Diabetic peripheral neuropathy - Sliding scale and Accu-Chek.   GERD - PPI   Depression/anxiety - Continue home medications   Insomnia - Continue home medications   Essential hypertension - Nebivolol .  IV as needed  Hypomagnesemia - As needed repletion    DVT prophylaxis:  Lovenox      Code Status: Full Code Family Communication:  Sister uptodate.  Status is: Inpatient Remains inpatient appropriate because: Ongoing management for severe sepsis    Subjective: Seen and examined at bedside no complaints.  Overall feeling okay   Examination:  General exam: Appears calm and comfortable  Respiratory system: Clear to auscultation. Respiratory effort normal. Cardiovascular system: S1 & S2 heard, RRR. No JVD, murmurs, rubs, gallops or clicks. No pedal edema. Gastrointestinal system: Abdomen is nondistended, soft and nontender. No organomegaly or masses felt. Normal bowel sounds heard. Central nervous system: Alert and oriented. No focal neurological deficits. Extremities: Symmetric 5 x 5 power. Skin: No rashes, lesions or ulcers Psychiatry: Judgement and insight appear normal. Mood & affect appropriate. Sacral wound with foul-smelling drainage               Diet Orders (From admission, onward)     Start     Ordered   04/23/24 1644  Diet regular Room service appropriate? Yes; Fluid consistency: Thin  Diet effective now       Question Answer Comment  Room service appropriate? Yes   Fluid consistency: Thin      04/23/24 1644            Objective: Vitals:   04/24/24 1938 04/24/24 2032 04/25/24 0436 04/25/24 0722  BP: 128/61 (!) 98/57 134/68 125/68  Pulse: 97 85 70 69  Resp: 17 16 16 18   Temp: 99.8 F (37.7 C) 99.1 F (37.3 C) 98 F (36.7 C) 98.6 F (37 C)  TempSrc: Oral Oral Oral   SpO2: (!) 89% 90% 92% 92%  Weight:      Height:        Intake/Output  Summary (Last 24 hours) at 04/25/2024 1034 Last data filed at 04/25/2024 9052 Gross per 24 hour  Intake 2017.09 ml  Output 2000 ml  Net 17.09 ml   Filed Weights   04/23/24 1812  Weight: 94.2 kg    Scheduled Meds:  buPROPion   300 mg Oral Daily   carbidopa -levodopa   1 tablet Oral TID   Chlorhexidine  Gluconate Cloth  6 each Topical Q0600   doxycycline   100 mg Oral Q12H    DULoxetine   120 mg Oral Daily   dutasteride   0.5 mg Oral Daily   enoxaparin  (LOVENOX ) injection  40 mg Subcutaneous Q24H   feeding supplement  237 mL Oral BID BM   hydrocortisone  cream   Topical BID   insulin  aspart  0-15 Units Subcutaneous TID WC   lithium  carbonate  150 mg Oral QPM   metroNIDAZOLE   500 mg Oral Q12H   mirtazapine   15 mg Oral QHS   multivitamin with minerals  1 tablet Oral Daily   nebivolol   2.5 mg Oral Daily   nutrition supplement (JUVEN)  1 packet Oral BID BM   pantoprazole   40 mg Oral Daily   polyethylene glycol  17 g Oral BID   sodium hypochlorite   Irrigation BID   tamsulosin   0.4 mg Oral q1800   Continuous Infusions:  cefTRIAXone  (ROCEPHIN )  IV 2 g (04/24/24 1158)    Nutritional status Signs/Symptoms: estimated needs Interventions: Ensure Enlive (each supplement provides 350kcal and 20 grams of protein), MVI, Juven Body mass index is 25.96 kg/m.  Data Reviewed:   CBC: Recent Labs  Lab 04/23/24 1532 04/23/24 1643 04/24/24 0919 04/25/24 0804  WBC 20.6* 17.3* 14.8* 14.3*  NEUTROABS 18.0*  --   --   --   HGB 11.4* 9.5* 9.8* 10.0*  HCT 36.1* 30.4* 30.5* 30.8*  MCV 90.9 92.1 90.5 89.5  PLT 424* 357 350 376   Basic Metabolic Panel: Recent Labs  Lab 04/23/24 1532 04/23/24 1643 04/24/24 0919 04/25/24 0804  NA 135  --  132* 135  K 4.2  --  3.5 3.6  CL 100  --  100 103  CO2 23  --  21* 23  GLUCOSE 116*  --  112* 114*  BUN 22  --  19 13  CREATININE 0.70 0.80 0.69 0.78  CALCIUM 8.7*  --  8.0* 8.1*  MG  --   --  1.3* 1.7  PHOS  --   --  3.9  --    GFR: Estimated Creatinine Clearance: 102.7 mL/min (by C-G formula based on SCr of 0.78 mg/dL). Liver Function Tests: Recent Labs  Lab 04/23/24 1532  AST 18  ALT 16  ALKPHOS 65  BILITOT 0.7  PROT 6.5  ALBUMIN  2.6*   No results for input(s): LIPASE, AMYLASE in the last 168 hours. No results for input(s): AMMONIA in the last 168 hours. Coagulation Profile: Recent Labs  Lab  04/23/24 1532  INR 1.2   Cardiac Enzymes: No results for input(s): CKTOTAL, CKMB, CKMBINDEX, TROPONINI in the last 168 hours. BNP (last 3 results) No results for input(s): PROBNP in the last 8760 hours. HbA1C: No results for input(s): HGBA1C in the last 72 hours. CBG: Recent Labs  Lab 04/24/24 1148 04/24/24 1609 04/24/24 2043 04/24/24 2207 04/25/24 0617  GLUCAP 95 102* 127* 101* 117*   Lipid Profile: No results for input(s): CHOL, HDL, LDLCALC, TRIG, CHOLHDL, LDLDIRECT in the last 72 hours. Thyroid Function Tests: No results for input(s): TSH, T4TOTAL, FREET4, T3FREE, THYROIDAB in the last  72 hours. Anemia Panel: No results for input(s): VITAMINB12, FOLATE, FERRITIN, TIBC, IRON, RETICCTPCT in the last 72 hours. Sepsis Labs: Recent Labs  Lab 04/23/24 1537 04/23/24 1726  LATICACIDVEN 2.5* 2.4*    Recent Results (from the past 240 hours)  Blood Culture (routine x 2)     Status: None (Preliminary result)   Collection Time: 04/23/24  2:52 PM   Specimen: BLOOD  Result Value Ref Range Status   Specimen Description BLOOD SITE NOT SPECIFIED  Final   Special Requests   Final    BOTTLES DRAWN AEROBIC AND ANAEROBIC Blood Culture adequate volume   Culture  Setup Time   Final    GRAM NEGATIVE RODS IN BOTH AEROBIC AND ANAEROBIC BOTTLES CRITICAL RESULT CALLED TO, READ BACK BY AND VERIFIED WITH: MAYA MAFFUCCI [id number] FCP    Culture   Final    GRAM NEGATIVE RODS IDENTIFICATION TO FOLLOW Performed at [facility], [address], [place], [place] [address]    Report Status PENDING  Incomplete  Blood Culture ID Panel (Reflexed)     Status: Abnormal   Collection Time: 04/23/24  2:52 PM  Result Value Ref Range Status   Enterococcus faecalis NOT DETECTED NOT DETECTED Final   Enterococcus Faecium NOT DETECTED NOT DETECTED Final   Listeria monocytogenes NOT DETECTED NOT DETECTED Final   Staphylococcus species NOT DETECTED NOT  DETECTED Final   Staphylococcus aureus (BCID) NOT DETECTED NOT DETECTED Final   Staphylococcus epidermidis NOT DETECTED NOT DETECTED Final   Staphylococcus lugdunensis NOT DETECTED NOT DETECTED Final   Streptococcus species NOT DETECTED NOT DETECTED Final   Streptococcus agalactiae NOT DETECTED NOT DETECTED Final   Streptococcus pneumoniae NOT DETECTED NOT DETECTED Final   Streptococcus pyogenes NOT DETECTED NOT DETECTED Final   A.calcoaceticus-baumannii NOT DETECTED NOT DETECTED Final   Bacteroides fragilis NOT DETECTED NOT DETECTED Final   Enterobacterales DETECTED (A) NOT DETECTED Final    Comment: Enterobacterales represent a large order of gram negative bacteria, not a single organism. CRITICAL RESULT CALLED TO, READ BACK BY AND VERIFIED WITH: PHARMD BLAKE [id number] FCP    Enterobacter cloacae complex NOT DETECTED NOT DETECTED Final   Escherichia coli NOT DETECTED NOT DETECTED Final   Klebsiella aerogenes NOT DETECTED NOT DETECTED Final   Klebsiella oxytoca NOT DETECTED NOT DETECTED Final   Klebsiella pneumoniae NOT DETECTED NOT DETECTED Final   Proteus species DETECTED (A) NOT DETECTED Final    Comment: CRITICAL RESULT CALLED TO, READ BACK BY AND VERIFIED WITH: PHARMD BLAKE [id number] FCP    Salmonella species NOT DETECTED NOT DETECTED Final   Serratia marcescens NOT DETECTED NOT DETECTED Final   Haemophilus influenzae NOT DETECTED NOT DETECTED Final   Neisseria meningitidis NOT DETECTED NOT DETECTED Final   Pseudomonas aeruginosa NOT DETECTED NOT DETECTED Final   Stenotrophomonas maltophilia NOT DETECTED NOT DETECTED Final   Candida albicans NOT DETECTED NOT DETECTED Final   Candida auris NOT DETECTED NOT DETECTED Final   Candida glabrata NOT DETECTED NOT DETECTED Final   Candida krusei NOT DETECTED NOT DETECTED Final   Candida parapsilosis NOT DETECTED NOT DETECTED Final   Candida tropicalis NOT DETECTED NOT DETECTED Final   Cryptococcus neoformans/gattii NOT DETECTED  NOT DETECTED Final   CTX-M ESBL NOT DETECTED NOT DETECTED Final   Carbapenem resistance IMP NOT DETECTED NOT DETECTED Final   Carbapenem resistance KPC NOT DETECTED NOT DETECTED Final   Carbapenem resistance NDM NOT DETECTED NOT DETECTED Final   Carbapenem resist OXA 48  LIKE NOT DETECTED NOT DETECTED Final   Carbapenem resistance VIM NOT DETECTED NOT DETECTED Final    Comment: Performed at [facility], [address], [place], [place] [address]  Urine Culture     Status: Abnormal   Collection Time: 04/23/24  3:32 PM   Specimen: Urine, Random  Result Value Ref Range Status   Specimen Description URINE, RANDOM  Final   Special Requests   Final    NONE Reflexed from [id number] Performed at [facility], [address], [place], [place] [address]    Culture MULTIPLE SPECIES PRESENT, SUGGEST RECOLLECTION (A)  Final   Report Status 04/24/2024 FINAL  Final  Resp panel by RT-PCR (RSV, Flu A&B, Covid) Anterior Nasal Swab     Status: None   Collection Time: 04/23/24  5:03 PM   Specimen: Anterior Nasal Swab  Result Value Ref Range Status   SARS Coronavirus 2 by RT PCR NEGATIVE NEGATIVE Final   Influenza A by PCR NEGATIVE NEGATIVE Final   Influenza B by PCR NEGATIVE NEGATIVE Final    Comment: (NOTE) The Xpert Xpress SARS-CoV-2/FLU/RSV plus assay is intended as an aid in the diagnosis of influenza from Nasopharyngeal swab specimens and should not be used as a sole basis for treatment. Nasal washings and aspirates are unacceptable for Xpert Xpress SARS-CoV-2/FLU/RSV testing.  Fact Sheet for Patients: BloggerCourse.com  Fact Sheet for Healthcare Providers: SeriousBroker.it  This test is not yet approved or cleared by the [place]  FDA and has been authorized for detection and/or diagnosis of SARS-CoV-2 by FDA under an Emergency Use Authorization (EUA). This EUA will remain in effect (meaning this test can be used) for the  duration of the COVID-19 declaration under Section 564(b)(1) of the Act, 21 U.S.C. section 360bbb-3(b)(1), unless the authorization is terminated or revoked.     Resp Syncytial Virus by PCR NEGATIVE NEGATIVE Final    Comment: (NOTE) Fact Sheet for Patients: BloggerCourse.com  Fact Sheet for Healthcare Providers: SeriousBroker.it  This test is not yet approved or cleared by the [place]  FDA and has been authorized for detection and/or diagnosis of SARS-CoV-2 by FDA under an Emergency Use Authorization (EUA). This EUA will remain in effect (meaning this test can be used) for the duration of the COVID-19 declaration under Section 564(b)(1) of the Act, 21 U.S.C. section 360bbb-3(b)(1), unless the authorization is terminated or revoked.  Performed at [facility], [address], [place], [place] [address]   Blood Culture (routine x 2)     Status: None (Preliminary result)   Collection Time: 04/23/24  6:25 PM   Specimen: BLOOD LEFT HAND  Result Value Ref Range Status   Specimen Description BLOOD LEFT HAND  Final   Special Requests   Final    BOTTLES DRAWN AEROBIC ONLY Blood Culture results may not be optimal due to an inadequate volume of blood received in culture bottles   Culture   Final    NO GROWTH 2 DAYS Performed at [facility], [address] [place], [place] [address]    Report Status PENDING  Incomplete  MRSA Next Gen by PCR, Nasal     Status: None   Collection Time: 04/24/24  7:52 AM   Specimen: Nasal Mucosa; Nasal Swab  Result Value Ref Range Status   MRSA by PCR Next Gen NOT DETECTED NOT DETECTED Final    Comment: (NOTE) The GeneXpert MRSA Assay (FDA approved for NASAL specimens only), is one component of a comprehensive  MRSA colonization surveillance program. It is not intended to diagnose MRSA infection nor to guide or monitor treatment for MRSA infections. Test performance is not FDA approved  in patients less than 28 years old. Performed at [facility], [address] [place], [place] [address]          Radiology Studies: MR Lumbar Spine W Wo Contrast Result Date: 04/24/2024 CLINICAL DATA:  73 year old male with chronic myelopathy. Query spinal infection. EXAM: MRI LUMBAR SPINE WITHOUT AND WITH CONTRAST TECHNIQUE: Multiplanar and multiecho pulse sequences of the lumbar spine were obtained without and with intravenous contrast. CONTRAST:  9mL GADAVIST  GADOBUTROL  1 MMOL/ML IV SOLN COMPARISON:  Sacral MRI the same day reported separately. Lumbar MRI 10/31/2018. CT Abdomen and Pelvis 04/23/2024. FINDINGS: Segmentation: Normal on the CT yesterday which is the same numbering system used on the 2020 MRI. Alignment: Lumbar lordosis with chronic grade 1 anterolisthesis of L4 on L5. Underlying levoconvex lumbar scoliosis is moderate, better demonstrated on the recent CT. Degenerative appearing retrolisthesis of L1 on L2 is chronic but progressed since 2020. Vertebrae: Chronic postoperative changes with hardware susceptibility artifact L2 through L5. Normal background bone marrow signal. CT evidence of solid arthrodesis through those levels, and also degenerative interbody ankylosis at L5-S1. Visible lower thoracic levels and L1 vertebrae appear intact with maintained height. L1 benign vertebral body hemangioma (normal variant). Sacrum is described separately. No lower thoracic or lumbar marrow edema or acute osseous abnormality. Conus medullaris and cauda equina: Conus extends to the T12-L1 level. No lower spinal cord or conus signal abnormality. Fatty filum terminalis, normal variant. No abnormal intradural enhancement. No dural thickening. Paraspinal and other soft tissues: Postoperative changes to the posterior lumbar paraspinal soft tissues, no change since 2020. No lumbar paraspinal fluid collection. Stable visible abdominal viscera. Disc levels: T11-T12 and T12-L1: Negative, and interbody  ankylosis at those levels from Diffuse idiopathic skeletal hyperostosis (DISH). Demonstrated on CT yesterday. L1-L2: Severe disc space loss with vacuum disc by CT. Mild retrolisthesis, chronic but increased since 2020. Moderate to severe multifactorial spinal stenosis (series 8, image 13 and series 5, image 9. Moderate left and moderate to severe right L1 foraminal stenosis. This level has progressed since 2020. L2-L3:  Decompression and fusion with no adverse features. L3-L4:  Decompression and fusion with no adverse features. L4-L5: Decompression and fusion. Chronic severe right side osseous L4 foraminal stenosis, stable since 2020. L5-S1: Interbody ankylosis. Bulky circumferential disc osteophyte complex. No spinal stenosis. Mild to moderate chronic left lateral recess stenosis is stable since 2020. Moderate left greater than right L5 foraminal stenosis also stable. IMPRESSION: 1. No evidence of Lumbar Spine infection. See Sacrum MRI reported separately. 2. 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 Chronic lumbar fusion L2 through L5. And degenerative interbody ankylosis at L5-S1. 3. 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 Electronically Signed   By: VEAR Hurst M.D.   On: 04/24/2024 09:07   MR SACRUM SI JOINTS W WO CONTRAST Result Date: 04/24/2024 CLINICAL DATA:  Chronic myelopathy, for infection. Parkinson's disease. Chronic sacral wound. Chronic urinary retention. EXAM: MRI SACRUM WITHOUT CONTRAST TECHNIQUE: Multiplanar multi-sequence MR imaging of the sacrum was performed. No intravenous contrast was administered. COMPARISON:  CT pelvis 04/23/2024 FINDINGS: OSSEOUS STRUCTURES AND JOINTS: Abnormal edema signal compatible with osteomyelitis involving the lower fifth segment of the sacrum and upper coccyx as on image 13 series 6 with deep overlying  sacral decubitus ulcer  extending essentially to the periosteal margin as shown on image 9 series 13. Postoperative findings in the lower lumbar spine, please see dedicated lumbar spine MRI report. Mild spurring of the SI joints but no SI joint effusion. Trace presacral edema. MUSCULOTENDINOUS: Unremarkable SACRAL PLEXUS: No impinging lesion is identified involving the sacral plexus or proximal sciatic nerves. OTHER: Foley catheter in the otherwise empty urinary bladder. Moderate prominence of stool in the rectal vault, cannot exclude fecal impaction. IMPRESSION: 1. Osteomyelitis involving the lower fifth segment of the sacrum and adjacent upper coccyx with deep overlying sacral decubitus ulcer extending essentially to the periosteal margin. 2. Moderate prominence of stool in the rectal vault, cannot exclude fecal impaction. Electronically Signed   By: Ryan Salvage M.D.   On: 04/24/2024 08:29   CT ABDOMEN PELVIS W CONTRAST Result Date: 04/23/2024 CLINICAL DATA:  Sepsis.  Worsening sacral wound. EXAM: CT ABDOMEN AND PELVIS WITH CONTRAST TECHNIQUE: Multidetector CT imaging of the abdomen and pelvis was performed using the standard protocol following bolus administration of intravenous contrast. RADIATION DOSE REDUCTION: This exam was performed according to the departmental dose-optimization program which includes automated exposure control, adjustment of the mA and/or kV according to patient size and/or use of iterative reconstruction technique. CONTRAST:  75mL OMNIPAQUE  IOHEXOL  350 MG/ML SOLN COMPARISON:  CT abdomen pelvis dated 03/19/2024. FINDINGS: Lower chest: No acute abnormality. No intra-abdominal free air or free fluid. Hepatobiliary: Small liver cysts. No biliary dilatation. The gallbladder is unremarkable Pancreas: Unremarkable. No pancreatic ductal dilatation or surrounding inflammatory changes. Spleen: Normal in size without focal abnormality. Adrenals/Urinary Tract: The adrenal glands unremarkable. Small bilateral renal  cysts. There is no hydronephrosis on either side. There is symmetric enhancement and excretion of contrast by both kidneys. The visualized ureters appear unremarkable. The urinary bladder is decompressed around a Foley catheter. Stomach/Bowel: Moderate stool throughout the colon. There is no bowel obstruction or active inflammation. The appendix is not visualized with certainty. No inflammatory changes identified in the right lower quadrant. Vascular/Lymphatic: Mild aortoiliac atherosclerotic disease. The IVC is unremarkable. No portal venous gas. There is no adenopathy. Reproductive: The prostate and seminal vesicles are grossly unremarkable. No pelvic mass. Other: Small fat containing right inguinal hernia. Interval development of a skin ulcer over the distal sacrum extending deep into the soft tissues to the level of the bone. No definite bone erosion or evidence of acute osteomyelitis by CT. No fluid collection or abscess. Musculoskeletal: Osteopenia with degenerative changes of the spine. Multilevel lumbar fusion. No acute osseous pathology. IMPRESSION: 1. Interval development of a skin ulcer over the distal sacrum. No evidence of acute osteomyelitis by CT. No fluid collection or abscess. 2. No bowel obstruction. 3.  Aortic Atherosclerosis (ICD10-I70.0). Electronically Signed   By: Vanetta Chou M.D.   On: 04/23/2024 16:49   DG Chest Port 1 View Result Date: 04/23/2024 CLINICAL DATA:  Questionable sepsis - evaluate for abnormality EXAM: PORTABLE CHEST - 1 VIEW COMPARISON:  February 25, 2024 FINDINGS: Elevation of the right hemidiaphragm. No focal airspace consolidation, pleural effusion, or pneumothorax. No cardiomegaly. No acute fracture or destructive lesion. Multilevel degenerative disc disease of the spine. IMPRESSION: No acute cardiopulmonary abnormality. Electronically Signed   By: Rogelia Myers M.D.   On: 04/23/2024 16:06           LOS: 2 days   Time spent= 35 mins    Burgess JAYSON Dare,  MD [facility]  If 7PM-7AM, please contact night-coverage  04/25/2024, 10:34 AM

## 2024-04-26 DIAGNOSIS — A419 Sepsis, unspecified organism: Secondary | ICD-10-CM | POA: Diagnosis not present

## 2024-04-26 DIAGNOSIS — L0390 Cellulitis, unspecified: Secondary | ICD-10-CM | POA: Diagnosis not present

## 2024-04-26 LAB — CBC
HCT: 30.8 % — ABNORMAL LOW (ref 39.0–52.0)
Hemoglobin: 10 g/dL — ABNORMAL LOW (ref 13.0–17.0)
MCH: 28.8 pg (ref 26.0–34.0)
MCHC: 32.5 g/dL (ref 30.0–36.0)
MCV: 88.8 fL (ref 80.0–100.0)
Platelets: 384 K/uL (ref 150–400)
RBC: 3.47 MIL/uL — ABNORMAL LOW (ref 4.22–5.81)
RDW: 13.8 % (ref 11.5–15.5)
WBC: 13.8 K/uL — ABNORMAL HIGH (ref 4.0–10.5)
nRBC: 0 % (ref 0.0–0.2)

## 2024-04-26 LAB — BASIC METABOLIC PANEL WITH GFR
Anion gap: 9 (ref 5–15)
BUN: 15 mg/dL (ref 8–23)
CO2: 24 mmol/L (ref 22–32)
Calcium: 8 mg/dL — ABNORMAL LOW (ref 8.9–10.3)
Chloride: 102 mmol/L (ref 98–111)
Creatinine, Ser: 0.58 mg/dL — ABNORMAL LOW (ref 0.61–1.24)
GFR, Estimated: >60 mL/min (ref >60)
Glucose, Bld: 116 mg/dL — ABNORMAL HIGH (ref 70–99)
Potassium: 3.3 mmol/L — ABNORMAL LOW (ref 3.5–5.1)
Sodium: 135 mmol/L (ref 135–145)

## 2024-04-26 LAB — GLUCOSE, CAPILLARY
Glucose-Capillary: 115 mg/dL — ABNORMAL HIGH (ref 70–99)
Glucose-Capillary: 100 mg/dL — ABNORMAL HIGH (ref 70–99)
Glucose-Capillary: 111 mg/dL — ABNORMAL HIGH (ref 70–99)
Glucose-Capillary: 100 mg/dL — ABNORMAL HIGH (ref 70–99)
Glucose-Capillary: 122 mg/dL — ABNORMAL HIGH (ref 70–99)

## 2024-04-26 LAB — CULTURE, BLOOD (ROUTINE X 2): Special Requests: ADEQUATE

## 2024-04-26 LAB — MAGNESIUM: Magnesium: 1.5 mg/dL — ABNORMAL LOW (ref 1.7–2.4)

## 2024-04-26 MED ORDER — MAGNESIUM SULFATE 4 GM/100ML IV SOLN
4.0000 g | Freq: Once | INTRAVENOUS | Status: AC
  Administered 2024-04-26: 4 g via INTRAVENOUS
  Filled 2024-04-26: qty 100

## 2024-04-26 MED ORDER — MORPHINE SULFATE (PF) 2 MG/ML IV SOLN
2.0000 mg | INTRAVENOUS | Status: DC | PRN
  Administered 2024-04-26 – 2024-04-30 (×6): 2 mg via INTRAVENOUS
  Filled 2024-04-26 (×6): qty 1

## 2024-04-26 MED ORDER — POTASSIUM CHLORIDE 20 MEQ PO PACK
40.0000 meq | PACK | Freq: Once | ORAL | Status: AC
  Administered 2024-04-26: 40 meq via ORAL
  Filled 2024-04-26: qty 2

## 2024-04-26 MED ORDER — DIPHENHYDRAMINE HCL 25 MG PO CAPS
25.0000 mg | ORAL_CAPSULE | Freq: Once | ORAL | Status: AC | PRN
  Administered 2024-04-26: 25 mg via ORAL
  Filled 2024-04-26: qty 1

## 2024-04-26 MED ORDER — POTASSIUM CHLORIDE 10 MEQ/100ML IV SOLN
10.0000 meq | INTRAVENOUS | Status: AC
  Administered 2024-04-26 (×3): 10 meq via INTRAVENOUS
  Filled 2024-04-26 (×4): qty 100

## 2024-04-26 MED ORDER — AMOXICILLIN-POT CLAVULANATE 875-125 MG PO TABS
1.0000 | ORAL_TABLET | Freq: Two times a day (BID) | ORAL | Status: DC
  Administered 2024-04-26 – 2024-04-30 (×8): 1 via ORAL
  Filled 2024-04-26 (×8): qty 1

## 2024-04-26 MED ORDER — DIPHENHYDRAMINE HCL 25 MG PO CAPS
25.0000 mg | ORAL_CAPSULE | Freq: Three times a day (TID) | ORAL | Status: DC | PRN
  Administered 2024-04-26 – 2024-04-27 (×2): 25 mg via ORAL
  Filled 2024-04-26 (×2): qty 1

## 2024-04-26 NOTE — Progress Notes (Signed)
      INFECTIOUS DISEASE ATTENDING ADDENDUM:   Date: 04/26/2024  Patient name: Douglas Price  Medical record number: [id number]  Date of birth: 1951/05/08   Proteus is S to augmentin   Dr Chapman plan in that case was for pt to be DC on   Doxycline 100mg  po bid and augmentin  875/125mg  po BID to complete 6 weeks of therapy including the 4 days he got in the hospital   Douglas Price has an appointment on 05/21/2024 at 10 AM with Dr. Dennise at  [facility], which  is located in the [facility] at  [address] in [place].  [address], which is located to the left of the elevators.  Phone: [phone]  Fax: [phone]  [email or web address]  The patient should arrive 30 minutes prior to their appoitment.  I will sign off for now  Please call with further questions.    Jomarie [place] Dam 04/26/2024, 2:22 PM

## 2024-04-26 NOTE — Progress Notes (Signed)
 Name: Douglas Price DOB: 01-Aug-1951  Please be advised that the above-named patient will require a short-term nursing home stay -- anticipated 30 days or less for rehabilitation and strengthening. The plan is for return home.

## 2024-04-26 NOTE — [place] FL2 (Signed)
 [place]  MEDICAID FL2 LEVEL OF CARE FORM     IDENTIFICATION  Patient Name: Douglas Price Birthdate: Nov 28, 1950 Sex: male Admission Date (Current Location): 04/23/2024  [facility] and [place] Number:  Producer, television/film/video and Address:  [facility] [place], [address], [place], [place] [address]      Provider Number: [id number]  Attending Physician Name and Address:  Caleen Burgess BROCKS, MD  Relative Name and Phone Number:  Sargent, Mankey (Brother)  [phone]    Current Level of Care: Hospital Recommended Level of Care: Skilled Nursing Facility Prior Approval Number:    Date Approved/Denied:   PASRR Number: screen still running  Discharge Plan: Home    Current Diagnoses: Patient Active Problem List   Diagnosis Date Noted   Acute urinary retention 02/18/2024   Polypharmacy 02/15/2024   Hypokalemia 02/15/2024   Multiple wounds of skin 02/14/2024   Sepsis due to cellulitis (HCC) 02/14/2024   Transaminitis 02/14/2024   Fall at home, initial encounter 02/14/2024   Gait disturbance 02/14/2024   Depression 02/14/2024   Insomnia 02/14/2024   BPH (benign prostatic hyperplasia) 02/14/2024   GERD (gastroesophageal reflux disease) 02/14/2024   Pain due to onychomycosis of toenails of both feet 01/31/2023   Parkinson's disease (HCC) 11/25/2017   Diabetic polyneuropathy associated with type 2 diabetes mellitus (HCC) 11/25/2017   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 Comment) Knee Anterior;Right. Ankle Anterior;Right. Pressure Injury Sacrum Posterior)                       Personal Care Assistance Level of Assistance  Bathing, Feeding, Dressing Bathing Assistance: Maximum assistance Feeding assistance: Limited assistance Dressing Assistance: Maximum assistance     Functional Limitations Info  Sight, Hearing, Speech Sight Info: Impaired Hearing Info: Adequate Speech Info: Adequate    SPECIAL CARE FACTORS FREQUENCY  PT (By licensed PT), OT (By licensed OT)     PT Frequency: 5x/week OT Frequency: 5x/week            Contractures Contractures Info: Not present    Additional Factors Info  Code Status, Allergies Code Status Info: FULL Allergies Info: Avelox (Moxifloxacin)  Quinolones  Lexapro (Escitalopram)           Current Medications (04/26/2024):  This is the current hospital active medication list Current Facility-Administered Medications  Medication Dose Route Frequency Provider Last Rate Last Admin   acetaminophen  (TYLENOL ) tablet 650 mg  650 mg Oral Q6H PRN Amin, Ankit C, MD   650 mg at 04/25/24 1304   Or   acetaminophen  (TYLENOL ) suppository 650 mg  650 mg Rectal Q6H PRN Amin, Ankit C, MD       bisacodyl  (DULCOLAX) EC tablet 5 mg  5 mg Oral Daily PRN Amin, Ankit C, MD       buPROPion  (WELLBUTRIN  XL) 24 hr tablet 300 mg  300 mg Oral Daily Amin, Ankit C, MD   300 mg at  04/26/24 1034   carbidopa -levodopa  (SINEMET  IR) 25-100 MG per tablet immediate release 1 tablet  1 tablet Oral TID Amin, Ankit C, MD   1 tablet at 04/26/24 1034   cefTRIAXone  (ROCEPHIN ) 2 g in sodium chloride  0.9 % 100 mL IVPB  2 g Intravenous Q24H Amin, Ankit C, MD 200 mL/hr at 04/25/24 1310 2 g at 04/25/24 1310   Chlorhexidine  Gluconate Cloth 2 % PADS 6 each  6 each Topical Q0600 Amin, Ankit C, MD   6 each at 04/26/24 0510   clonazePAM  (KLONOPIN ) tablet 0.5 mg  0.5 mg Oral BID PRN Amin, Ankit C, MD    0.5 mg at 04/24/24 9173   diphenhydrAMINE  (BENADRYL ) capsule 25 mg  25 mg Oral Q8H PRN Amin, Ankit C, MD       doxycycline  (VIBRA -TABS) tablet 100 mg  100 mg Oral Q12H Vu, Trung T, MD   100 mg at 04/26/24 1034   DULoxetine  (CYMBALTA ) DR capsule 120 mg  120 mg Oral Daily Amin, Ankit C, MD   120 mg at 04/26/24 1034   dutasteride  (AVODART ) capsule 0.5 mg  0.5 mg Oral Daily Amin, Ankit C, MD   0.5 mg at 04/26/24 1034   enoxaparin  (LOVENOX ) injection 40 mg  40 mg Subcutaneous Q24H Amin, Ankit C, MD   40 mg at 04/25/24 1811   feeding supplement (ENSURE PLUS HIGH PROTEIN) liquid 237 mL  237 mL Oral BID BM Amin, Ankit C, MD   237 mL at 04/26/24 1035   glucagon  (human recombinant) (GLUCAGEN) injection 1 mg  1 mg Intravenous PRN Amin, Ankit C, MD       hydrALAZINE  (APRESOLINE ) injection 10 mg  10 mg Intravenous Q4H PRN Amin, Ankit C, MD       hydrocortisone  cream 1 %   Topical BID Amin, Ankit C, MD   Given at 04/26/24 1034   insulin  aspart (novoLOG ) injection 0-15 Units  0-15 Units Subcutaneous TID WC Amin, Ankit C, MD   3 Units at 04/25/24 1729   ipratropium-albuterol  (DUONEB) 0.5-2.5 (3) MG/3ML nebulizer solution 3 mL  3 mL Nebulization Q4H PRN Amin, Ankit C, MD       lithium  carbonate capsule 150 mg  150 mg Oral QPM Amin, Ankit C, MD   150 mg at 04/25/24 1810   magnesium  sulfate IVPB 4 g 100 mL  4 g Intravenous Once Amin, Ankit C, MD 50 mL/hr at 04/26/24 1129 4 g at 04/26/24 1129   metoprolol  tartrate (LOPRESSOR ) injection 5 mg  5 mg Intravenous Q4H PRN Amin, Ankit C, MD       metroNIDAZOLE  (FLAGYL ) tablet 500 mg  500 mg Oral Q12H Vu, Trung T, MD   500 mg at 04/26/24 1034   mirtazapine  (REMERON ) tablet 15 mg  15 mg Oral QHS Amin, Ankit C, MD   15 mg at 04/25/24 2147   morphine  (PF) 2 MG/ML injection 2 mg  2 mg Intravenous Q4H PRN Amin, Ankit C, MD       multivitamin with minerals tablet 1 tablet  1 tablet Oral Daily Amin, Ankit C, MD   1 tablet at 04/25/24 0934   nebivolol  (BYSTOLIC ) tablet 2.5 mg  2.5  mg Oral Daily Amin, Ankit C, MD   2.5 mg at 04/26/24 1034   nicotine  (NICODERM CQ  - dosed in mg/24 hours) patch 21 mg  21 mg Transdermal Daily PRN Amin, Ankit C, MD       nutrition supplement (JUVEN) (JUVEN) powder packet 1 packet  1 packet  Oral BID BM Amin, Ankit C, MD   1 packet at 04/26/24 9188   ondansetron  (ZOFRAN ) tablet 4 mg  4 mg Oral Q6H PRN Amin, Ankit C, MD       Or   ondansetron  (ZOFRAN ) injection 4 mg  4 mg Intravenous Q6H PRN Amin, Ankit C, MD       oxyCODONE  (Oxy IR/ROXICODONE ) immediate release tablet 5 mg  5 mg Oral Q4H PRN Amin, Ankit C, MD   5 mg at 04/26/24 1034   pantoprazole  (PROTONIX ) EC tablet 40 mg  40 mg Oral Daily Amin, Ankit C, MD   40 mg at 04/26/24 1034   polyethylene glycol (MIRALAX  / GLYCOLAX ) packet 17 g  17 g Oral BID Amin, Ankit C, MD       senna-docusate (Senokot-S) tablet 1 tablet  1 tablet Oral QHS PRN Amin, Ankit C, MD       sodium hypochlorite (DAKIN'S 1/4 STRENGTH) topical solution   Irrigation BID Amin, Ankit C, MD   Given at 04/25/24 2148   sodium phosphate  (FLEET) enema 1 enema  1 enema Rectal Once PRN Amin, Ankit C, MD       tamsulosin  (FLOMAX ) capsule 0.4 mg  0.4 mg Oral q1800 Amin, Ankit C, MD   0.4 mg at 04/25/24 1810   temazepam  (RESTORIL ) capsule 15 mg  15 mg Oral QHS PRN Amin, Ankit C, MD         Discharge Medications: Please see discharge summary for a list of discharge medications.  Relevant Imaging Results:  Relevant Lab Results:   Additional Information SSN:  [id number]  Najmah Carradine A [place], LCSW

## 2024-04-26 NOTE — TOC Initial Note (Addendum)
 Transition of Care [facility] ) - Initial/Assessment Note    Patient Details  Name: Douglas Price MRN: [id number] Date of Birth: [birth year]  Transition of Care [facility]) CM/SW Contact:    Soledad Budreau A [place], LCSW Phone Number: 04/26/2024, 12:30 PM  Clinical Narrative:                  CSW spoke with pt, stated he was from [facility] for short term rehab. Able to go back, been there for about 2 months. Requested CSW reach out to his sister for further conversation and assistance. CSW contacted pt's sister Niels. She informed CSW that pt would prefer not to return back to [facility] if possible. Requested CSW to send out referrals for more placement, but said she knows pt's insurance is a barrier given lack of in-network contracts with SNF's in the area. Ok to be faxed out further and requested possible review at [facility] given pt's wound needs.   CSW completed SNF workup to surrounding area, [facility], [facility], [facility] as well as [facility]. Informed Niels regular unit social worker will follow up regarding pending bed offers.   PASSR # pending, expired PASSR in [facility], 30 day note created, needed to be uploaded to portal. Possible [facility] has updated PASSR, confirm with facility if needed.  CSW will continue to follow for inpatient case management needs.    Expected Discharge Plan: Skilled Nursing Facility Barriers to Discharge: Continued Medical Work up, English as a second language teacher, SNF Pending bed offer   Patient Goals and CMS Choice            Expected Discharge Plan and Services       Living arrangements for the past 2 months: Skilled Nursing Facility                                      Prior Living Arrangements/Services Living arrangements for the past 2 months: Skilled Nursing Facility Lives with:: Self, Facility Resident          Need for Family Participation in Patient Care: Yes (Comment) Care giver support system in place?: Yes (comment) (pt's sister  Niels and brother Octaviano)      Activities of Daily Living      Permission Sought/Granted                  Emotional Assessment Appearance:: Appears stated age Attitude/Demeanor/Rapport: Lethargic, Engaged Affect (typically observed): Calm, Quiet Orientation: : Oriented to Self, Oriented to Place, Oriented to  Time, Oriented to Situation Alcohol / Substance Use: Not Applicable Psych Involvement: No (comment)  Admission diagnosis:  Wound of sacral region, initial encounter [S31.000A] Sepsis due to cellulitis (HCC) [L03.90, A41.9] Sepsis, due to unspecified organism, unspecified whether acute organ dysfunction present [facility]) [A41.9] Patient Active Problem List   Diagnosis Date Noted   Acute urinary retention 02/18/2024   Polypharmacy 02/15/2024   Hypokalemia 02/15/2024   Multiple wounds of skin 02/14/2024   Sepsis due to cellulitis (HCC) 02/14/2024   Transaminitis 02/14/2024   Fall at home, initial encounter 02/14/2024   Gait disturbance 02/14/2024   Depression 02/14/2024   Insomnia 02/14/2024   BPH (benign prostatic hyperplasia) 02/14/2024   GERD (gastroesophageal reflux disease) 02/14/2024   Pain due to onychomycosis of toenails of both feet 01/31/2023   Parkinson's disease (HCC) 11/25/2017   Diabetic polyneuropathy associated with type 2 diabetes mellitus (HCC) 11/25/2017   Spinal stenosis of lumbar region  with neurogenic claudication 11/25/2017   S/P lumbar spinal fusion 03/14/2016   S/P lumbar laminectomy 09/08/2015   Sleep disturbance 03/24/2014   PCP:  Janey Santos, MD Pharmacy:   [facility] [phone] [address], [place] - [address] AT [facility] [place] [address] [place] [address] Phone: [phone] Fax: [phone]  [facility] [address] [place] [address] Phone: [phone] Fax: [phone]  [facility] [address], [place] - [address] [place] [address] Phone: [phone] Fax: [phone]     Social Drivers of Health (SDOH) Social History: SDOH Screenings   Food Insecurity: No Food Insecurity (02/16/2024)  Housing: Low Risk  (02/16/2024)  Transportation Needs: No Transportation Needs (02/16/2024)  Utilities: Not At Risk (02/16/2024)  Depression (PHQ2-9): Medium Risk (02/11/2023)  Financial Resource Strain: Low Risk  (02/07/2023)  Social Connections: Moderately Isolated (02/16/2024)  Tobacco Use: High Risk (04/23/2024)   SDOH Interventions:     Readmission Risk Interventions     No data to display

## 2024-04-26 NOTE — Plan of Care (Signed)
  Problem: Skin Integrity: Goal: Risk for impaired skin integrity will decrease Outcome: Progressing   Problem: Safety: Goal: Ability to remain free from injury will improve Outcome: Progressing   Problem: Skin Integrity: Goal: Risk for impaired skin integrity will decrease Outcome: Progressing

## 2024-04-26 NOTE — Progress Notes (Signed)
 PROGRESS NOTE    Douglas Price  FMW:[id number] DOB: December 25, 1950 DOA: 04/23/2024 PCP: Janey Santos, MD    Brief Narrative:  73 y.o. male with medical history significant of depression, Parkinson's disease, DM 2, GERD, RLS, chronic sacral wound, insomnia, chronic urinary retention with Foley in place comes to the hospital for evaluation of fever.  Patient admitted for severe sepsis secondary to sacral wound infection versus urinary tract infection.  Blood culture started growing GNR.   Assessment & Plan:  Principal Problem:   Sepsis due to cellulitis [facility]) Active Problems:   Parkinson's disease (HCC)   Multiple wounds of skin   Diabetic polyneuropathy associated with type 2 diabetes mellitus (HCC)   Depression   S/P lumbar laminectomy     Severe sepsis secondary to sacral cellulitis/Osteomyelitis.  Gram-negative rods bacteremia -Continue to follow culture data.   Wound care team has been consulted, MRSA nasal swab.  Change antibiotics to vancomycin , Rocephin /Flagyl .  CT scan did not show any deep infection only skin ulceration/cellulitis.  MRI consistent with sacral osteomyelitis. - General Surgery recommending local wound care and pressure displacement otherwise no surgical I&D at this time. - ID-recommending Rocephin /Flagyl /doxycycline .  Final plans depending susceptibility data.  Chronic urinary retention, Foley present POA CAUTI BPH - Chronically wears Foley catheter.  Last changed by urology 7/28.  Foley catheter exchanged again in the hospital 8/1 -Flomax    History of Parkinson disease Dysphagia - Continue Sinemet  -Reportedly on regular diet    Diabetes mellitus type 2 Diabetic peripheral neuropathy - Sliding scale and Accu-Chek.   GERD - PPI   Depression/anxiety - Continue home medications   Insomnia - Continue home medications   Essential hypertension - Nebivolol .  IV as needed  Hypomagnesemia/hypokalemia - As needed repletion   TOC consulted for  placement  DVT prophylaxis: Lovenox      Code Status: Full Code Family Communication:  Sister uptodate.  Status is: Inpatient Remains inpatient appropriate because: Ongoing management for severe sepsis    Subjective: Seen at bedside, no complaints.  Sister present at bedside as well   Examination:  General exam: Appears calm and comfortable  Respiratory system: Clear to auscultation. Respiratory effort normal. Cardiovascular system: S1 & S2 heard, RRR. No JVD, murmurs, rubs, gallops or clicks. No pedal edema. Gastrointestinal system: Abdomen is nondistended, soft and nontender. No organomegaly or masses felt. Normal bowel sounds heard. Central nervous system: Alert and oriented. No focal neurological deficits. Extremities: Symmetric 5 x 5 power. Skin: No rashes, lesions or ulcers Psychiatry: Judgement and insight appear normal. Mood & affect appropriate. Sacral wound with foul-smelling drainage               Diet Orders (From admission, onward)     Start     Ordered   04/23/24 1644  Diet regular Room service appropriate? Yes; Fluid consistency: Thin  Diet effective now       Question Answer Comment  Room service appropriate? Yes   Fluid consistency: Thin      04/23/24 1644            Objective: Vitals:   04/25/24 1503 04/25/24 2016 04/26/24 0506 04/26/24 0728  BP: 124/67 133/82 131/84 130/83  Pulse: 90 82 70 68  Resp: 20 18 17    Temp: 99 F (37.2 C) 99.3 F (37.4 C) (!) 97.4 F (36.3 C) 98.1 F (36.7 C)  TempSrc:  Oral Oral Oral  SpO2: 92% (!) 86% (!) 89% 94%  Weight:      Height:  Intake/Output Summary (Last 24 hours) at 04/26/2024 1032 Last data filed at 04/26/2024 0510 Gross per 24 hour  Intake --  Output 950 ml  Net -950 ml   Filed Weights   04/23/24 1812  Weight: 94.2 kg    Scheduled Meds:  buPROPion   300 mg Oral Daily   carbidopa -levodopa   1 tablet Oral TID   Chlorhexidine  Gluconate Cloth  6 each Topical Q0600   doxycycline    100 mg Oral Q12H   DULoxetine   120 mg Oral Daily   dutasteride   0.5 mg Oral Daily   enoxaparin  (LOVENOX ) injection  40 mg Subcutaneous Q24H   feeding supplement  237 mL Oral BID BM   hydrocortisone  cream   Topical BID   insulin  aspart  0-15 Units Subcutaneous TID WC   lithium  carbonate  150 mg Oral QPM   metroNIDAZOLE   500 mg Oral Q12H   mirtazapine   15 mg Oral QHS   multivitamin with minerals  1 tablet Oral Daily   nebivolol   2.5 mg Oral Daily   nutrition supplement (JUVEN)  1 packet Oral BID BM   pantoprazole   40 mg Oral Daily   polyethylene glycol  17 g Oral BID   sodium hypochlorite   Irrigation BID   tamsulosin   0.4 mg Oral q1800   Continuous Infusions:  cefTRIAXone  (ROCEPHIN )  IV 2 g (04/25/24 1310)   magnesium  sulfate bolus IVPB     potassium chloride  10 mEq (04/26/24 1031)    Nutritional status Signs/Symptoms: estimated needs Interventions: Ensure Enlive (each supplement provides 350kcal and 20 grams of protein), MVI, Juven Body mass index is 25.96 kg/m.  Data Reviewed:   CBC: Recent Labs  Lab 04/23/24 1532 04/23/24 1643 04/24/24 0919 04/25/24 0804 04/26/24 0605  WBC 20.6* 17.3* 14.8* 14.3* 13.8*  NEUTROABS 18.0*  --   --   --   --   HGB 11.4* 9.5* 9.8* 10.0* 10.0*  HCT 36.1* 30.4* 30.5* 30.8* 30.8*  MCV 90.9 92.1 90.5 89.5 88.8  PLT 424* 357 350 376 384   Basic Metabolic Panel: Recent Labs  Lab 04/23/24 1532 04/23/24 1643 04/24/24 0919 04/25/24 0804 04/26/24 0605  NA 135  --  132* 135 135  K 4.2  --  3.5 3.6 3.3*  CL 100  --  100 103 102  CO2 23  --  21* 23 24  GLUCOSE 116*  --  112* 114* 116*  BUN 22  --  19 13 15   CREATININE 0.70 0.80 0.69 0.78 0.58*  CALCIUM 8.7*  --  8.0* 8.1* 8.0*  MG  --   --  1.3* 1.7 1.5*  PHOS  --   --  3.9  --   --    GFR: Estimated Creatinine Clearance: 102.7 mL/min (A) (by C-G formula based on SCr of 0.58 mg/dL (L)). Liver Function Tests: Recent Labs  Lab 04/23/24 1532  AST 18  ALT 16  ALKPHOS 65   BILITOT 0.7  PROT 6.5  ALBUMIN  2.6*   No results for input(s): LIPASE, AMYLASE in the last 168 hours. No results for input(s): AMMONIA in the last 168 hours. Coagulation Profile: Recent Labs  Lab 04/23/24 1532  INR 1.2   Cardiac Enzymes: No results for input(s): CKTOTAL, CKMB, CKMBINDEX, TROPONINI in the last 168 hours. BNP (last 3 results) No results for input(s): PROBNP in the last 8760 hours. HbA1C: No results for input(s): HGBA1C in the last 72 hours. CBG: Recent Labs  Lab 04/25/24 1131 04/25/24 1620 04/25/24 2015 04/26/24 0636 04/26/24  0735  GLUCAP 116* 152* 130* 115* 100*   Lipid Profile: No results for input(s): CHOL, HDL, LDLCALC, TRIG, CHOLHDL, LDLDIRECT in the last 72 hours. Thyroid Function Tests: No results for input(s): TSH, T4TOTAL, FREET4, T3FREE, THYROIDAB in the last 72 hours. Anemia Panel: No results for input(s): VITAMINB12, FOLATE, FERRITIN, TIBC, IRON, RETICCTPCT in the last 72 hours. Sepsis Labs: Recent Labs  Lab 04/23/24 1537 04/23/24 1726  LATICACIDVEN 2.5* 2.4*    Recent Results (from the past 240 hours)  Blood Culture (routine x 2)     Status: Abnormal (Preliminary result)   Collection Time: 04/23/24  2:52 PM   Specimen: BLOOD  Result Value Ref Range Status   Specimen Description BLOOD SITE NOT SPECIFIED  Final   Special Requests   Final    BOTTLES DRAWN AEROBIC AND ANAEROBIC Blood Culture adequate volume   Culture  Setup Time   Final    GRAM NEGATIVE RODS IN BOTH AEROBIC AND ANAEROBIC BOTTLES CRITICAL RESULT CALLED TO, READ BACK BY AND VERIFIED WITH: MAYA MAFFUCCI [id number] FCP    Culture (A)  Final    PROTEUS MIRABILIS SUSCEPTIBILITIES TO FOLLOW Performed at [facility], [address], [place], [place] [address]    Report Status PENDING  Incomplete  Blood Culture ID Panel (Reflexed)     Status: Abnormal   Collection Time: 04/23/24  2:52 PM  Result Value Ref Range  Status   Enterococcus faecalis NOT DETECTED NOT DETECTED Final   Enterococcus Faecium NOT DETECTED NOT DETECTED Final   Listeria monocytogenes NOT DETECTED NOT DETECTED Final   Staphylococcus species NOT DETECTED NOT DETECTED Final   Staphylococcus aureus (BCID) NOT DETECTED NOT DETECTED Final   Staphylococcus epidermidis NOT DETECTED NOT DETECTED Final   Staphylococcus lugdunensis NOT DETECTED NOT DETECTED Final   Streptococcus species NOT DETECTED NOT DETECTED Final   Streptococcus agalactiae NOT DETECTED NOT DETECTED Final   Streptococcus pneumoniae NOT DETECTED NOT DETECTED Final   Streptococcus pyogenes NOT DETECTED NOT DETECTED Final   A.calcoaceticus-baumannii NOT DETECTED NOT DETECTED Final   Bacteroides fragilis NOT DETECTED NOT DETECTED Final   Enterobacterales DETECTED (A) NOT DETECTED Final    Comment: Enterobacterales represent a large order of gram negative bacteria, not a single organism. CRITICAL RESULT CALLED TO, READ BACK BY AND VERIFIED WITH: PHARMD BLAKE [id number] FCP    Enterobacter cloacae complex NOT DETECTED NOT DETECTED Final   Escherichia coli NOT DETECTED NOT DETECTED Final   Klebsiella aerogenes NOT DETECTED NOT DETECTED Final   Klebsiella oxytoca NOT DETECTED NOT DETECTED Final   Klebsiella pneumoniae NOT DETECTED NOT DETECTED Final   Proteus species DETECTED (A) NOT DETECTED Final    Comment: CRITICAL RESULT CALLED TO, READ BACK BY AND VERIFIED WITH: PHARMD BLAKE [id number] FCP    Salmonella species NOT DETECTED NOT DETECTED Final   Serratia marcescens NOT DETECTED NOT DETECTED Final   Haemophilus influenzae NOT DETECTED NOT DETECTED Final   Neisseria meningitidis NOT DETECTED NOT DETECTED Final   Pseudomonas aeruginosa NOT DETECTED NOT DETECTED Final   Stenotrophomonas maltophilia NOT DETECTED NOT DETECTED Final   Candida albicans NOT DETECTED NOT DETECTED Final   Candida auris NOT DETECTED NOT DETECTED Final   Candida glabrata NOT DETECTED NOT  DETECTED Final   Candida krusei NOT DETECTED NOT DETECTED Final   Candida parapsilosis NOT DETECTED NOT DETECTED Final   Candida tropicalis NOT DETECTED NOT DETECTED Final   Cryptococcus neoformans/gattii NOT DETECTED NOT DETECTED Final   CTX-M ESBL  NOT DETECTED NOT DETECTED Final   Carbapenem resistance IMP NOT DETECTED NOT DETECTED Final   Carbapenem resistance KPC NOT DETECTED NOT DETECTED Final   Carbapenem resistance NDM NOT DETECTED NOT DETECTED Final   Carbapenem resist OXA 48 LIKE NOT DETECTED NOT DETECTED Final   Carbapenem resistance VIM NOT DETECTED NOT DETECTED Final    Comment: Performed at [facility], [address] [place], [place] [address]  Urine Culture     Status: Abnormal   Collection Time: 04/23/24  3:32 PM   Specimen: Urine, Random  Result Value Ref Range Status   Specimen Description URINE, RANDOM  Final   Special Requests   Final    NONE Reflexed from [id number] Performed at [facility], [address], [place], [place] [address]    Culture MULTIPLE SPECIES PRESENT, SUGGEST RECOLLECTION (A)  Final   Report Status 04/24/2024 FINAL  Final  Resp panel by RT-PCR (RSV, Flu A&B, Covid) Anterior Nasal Swab     Status: None   Collection Time: 04/23/24  5:03 PM   Specimen: Anterior Nasal Swab  Result Value Ref Range Status   SARS Coronavirus 2 by RT PCR NEGATIVE NEGATIVE Final   Influenza A by PCR NEGATIVE NEGATIVE Final   Influenza B by PCR NEGATIVE NEGATIVE Final    Comment: (NOTE) The Xpert Xpress SARS-CoV-2/FLU/RSV plus assay is intended as an aid in the diagnosis of influenza from Nasopharyngeal swab specimens and should not be used as a sole basis for treatment. Nasal washings and aspirates are unacceptable for Xpert Xpress SARS-CoV-2/FLU/RSV testing.  Fact Sheet for Patients: BloggerCourse.com  Fact Sheet for Healthcare Providers: SeriousBroker.it  This test is not yet approved or cleared by  the [place]  FDA and has been authorized for detection and/or diagnosis of SARS-CoV-2 by FDA under an Emergency Use Authorization (EUA). This EUA will remain in effect (meaning this test can be used) for the duration of the COVID-19 declaration under Section 564(b)(1) of the Act, 21 U.S.C. section 360bbb-3(b)(1), unless the authorization is terminated or revoked.     Resp Syncytial Virus by PCR NEGATIVE NEGATIVE Final    Comment: (NOTE) Fact Sheet for Patients: BloggerCourse.com  Fact Sheet for Healthcare Providers: SeriousBroker.it  This test is not yet approved or cleared by the [place]  FDA and has been authorized for detection and/or diagnosis of SARS-CoV-2 by FDA under an Emergency Use Authorization (EUA). This EUA will remain in effect (meaning this test can be used) for the duration of the COVID-19 declaration under Section 564(b)(1) of the Act, 21 U.S.C. section 360bbb-3(b)(1), unless the authorization is terminated or revoked.  Performed at [facility], [address], [place], [place] [address]   Blood Culture (routine x 2)     Status: None (Preliminary result)   Collection Time: 04/23/24  6:25 PM   Specimen: BLOOD LEFT HAND  Result Value Ref Range Status   Specimen Description BLOOD LEFT HAND  Final   Special Requests   Final    BOTTLES DRAWN AEROBIC ONLY Blood Culture results may not be optimal due to an inadequate volume of blood received in culture bottles   Culture   Final    NO GROWTH 2 DAYS Performed at [facility], [address], [place], [place] [address]    Report Status PENDING  Incomplete  MRSA Next Gen by PCR, Nasal     Status: None   Collection Time: 04/24/24  7:52 AM   Specimen: Nasal Mucosa;  Nasal Swab  Result Value Ref Range Status   MRSA by PCR Next Gen NOT DETECTED NOT DETECTED Final    Comment: (NOTE) The GeneXpert MRSA Assay (FDA approved for NASAL specimens  only), is one component of a comprehensive MRSA colonization surveillance program. It is not intended to diagnose MRSA infection nor to guide or monitor treatment for MRSA infections. Test performance is not FDA approved in patients less than 88 years old. Performed at [facility], [address], [place], [place] [address]          Radiology Studies: No results found.         LOS: 3 days   Time spent= 35 mins    Burgess JAYSON Dare, MD [facility]  If 7PM-7AM, please contact night-coverage  04/26/2024, 10:32 AM

## 2024-04-27 DIAGNOSIS — L0390 Cellulitis, unspecified: Secondary | ICD-10-CM | POA: Diagnosis not present

## 2024-04-27 DIAGNOSIS — A419 Sepsis, unspecified organism: Secondary | ICD-10-CM | POA: Diagnosis not present

## 2024-04-27 LAB — CBC
HCT: 32.7 % — ABNORMAL LOW (ref 39.0–52.0)
Hemoglobin: 10.5 g/dL — ABNORMAL LOW (ref 13.0–17.0)
MCH: 28.8 pg (ref 26.0–34.0)
MCHC: 32.1 g/dL (ref 30.0–36.0)
MCV: 89.8 fL (ref 80.0–100.0)
Platelets: 408 K/uL — ABNORMAL HIGH (ref 150–400)
RBC: 3.64 MIL/uL — ABNORMAL LOW (ref 4.22–5.81)
RDW: 13.7 % (ref 11.5–15.5)
WBC: 12.5 K/uL — ABNORMAL HIGH (ref 4.0–10.5)
nRBC: 0 % (ref 0.0–0.2)

## 2024-04-27 LAB — BASIC METABOLIC PANEL WITH GFR
Anion gap: 6 (ref 5–15)
BUN: 13 mg/dL (ref 8–23)
CO2: 27 mmol/L (ref 22–32)
Calcium: 8.2 mg/dL — ABNORMAL LOW (ref 8.9–10.3)
Chloride: 104 mmol/L (ref 98–111)
Creatinine, Ser: 0.62 mg/dL (ref 0.61–1.24)
GFR, Estimated: >60 mL/min (ref >60)
Glucose, Bld: 105 mg/dL — ABNORMAL HIGH (ref 70–99)
Potassium: 4 mmol/L (ref 3.5–5.1)
Sodium: 137 mmol/L (ref 135–145)

## 2024-04-27 LAB — GLUCOSE, CAPILLARY
Glucose-Capillary: 99 mg/dL (ref 70–99)
Glucose-Capillary: 108 mg/dL — ABNORMAL HIGH (ref 70–99)
Glucose-Capillary: 101 mg/dL — ABNORMAL HIGH (ref 70–99)
Glucose-Capillary: 119 mg/dL — ABNORMAL HIGH (ref 70–99)

## 2024-04-27 LAB — MAGNESIUM: Magnesium: 1.7 mg/dL (ref 1.7–2.4)

## 2024-04-27 MED ORDER — JUVEN PO PACK: 1.0000 | PACK | Freq: Two times a day (BID) | ORAL | Status: AC

## 2024-04-27 MED ORDER — BISACODYL 5 MG PO TBEC: 10.0000 mg | DELAYED_RELEASE_TABLET | Freq: Every day | ORAL | Status: DC | PRN

## 2024-04-27 MED ORDER — COLLAGENASE 250 UNIT/GM EX OINT: TOPICAL_OINTMENT | Freq: Every day | CUTANEOUS | Status: DC

## 2024-04-27 MED ORDER — DOXYCYCLINE HYCLATE 100 MG PO TABS: 100.0000 mg | ORAL_TABLET | Freq: Two times a day (BID) | ORAL | Status: AC

## 2024-04-27 MED ORDER — TEMAZEPAM 15 MG PO CAPS: 15.0000 mg | ORAL_CAPSULE | Freq: Every evening | ORAL | 0 refills | Status: DC | PRN

## 2024-04-27 MED ORDER — ADULT MULTIVITAMIN W/MINERALS CH: 1.0000 | ORAL_TABLET | Freq: Every day | ORAL | Status: DC

## 2024-04-27 MED ORDER — HYDROCORTISONE 1 % EX CREA: TOPICAL_CREAM | Freq: Two times a day (BID) | CUTANEOUS | Status: DC

## 2024-04-27 MED ORDER — ZINC OXIDE 40 % EX OINT
TOPICAL_OINTMENT | Freq: Two times a day (BID) | CUTANEOUS | Status: DC
Start: 2024-04-27 — End: 2024-07-24

## 2024-04-27 MED ORDER — CLONAZEPAM 0.5 MG PO TABS: 0.5000 mg | ORAL_TABLET | Freq: Two times a day (BID) | ORAL | 0 refills | Status: DC | PRN

## 2024-04-27 MED ORDER — ZINC OXIDE 40 % EX OINT
TOPICAL_OINTMENT | Freq: Two times a day (BID) | CUTANEOUS | Status: DC
  Filled 2024-04-27: qty 57

## 2024-04-27 MED ORDER — AMOXICILLIN-POT CLAVULANATE 875-125 MG PO TABS: 1.0000 | ORAL_TABLET | Freq: Two times a day (BID) | ORAL | Status: AC

## 2024-04-27 MED ORDER — OXYCODONE HCL 5 MG PO TABS: 5.0000 mg | ORAL_TABLET | ORAL | 0 refills | Status: DC | PRN

## 2024-04-27 MED ORDER — ENSURE PLUS HIGH PROTEIN PO LIQD
237.0000 mL | Freq: Two times a day (BID) | ORAL | Status: DC
Start: 2024-04-27 — End: 2024-07-24

## 2024-04-27 MED ORDER — COLLAGENASE 250 UNIT/GM EX OINT
TOPICAL_OINTMENT | Freq: Every day | CUTANEOUS | Status: DC
  Filled 2024-04-27: qty 30

## 2024-04-27 NOTE — Consult Note (Addendum)
 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  Apply 1/4 thick layer of Santyl  to wound bed daily to cover all necrotic tissue,  using a Q tip applicator fill in wound bed with saline moistened gauze/kerlix, cover with dry gauze and secure with ABD pad or silicone foam whichever is preferred.    Will write for a thin layer of Desitin to surrounding intact skin of sacral wound to assist with moisture damage.   Patient should continue on a low air loss mattress for pressure redistribution and moisture management.  POC discussed with bedside nurse. WOC team will not follow Re-consult if further needs arise.   Thank you,    Powell Bar MSN, RN-BC, [employer]

## 2024-04-27 NOTE — TOC Progression Note (Addendum)
 Transition of Care [facility]) - Progression Note    Patient Details  Name: Douglas Price MRN: [id number] Date of Birth: 10/08/1950  [facility]) CM/SW Contact  Bridget Cordella Simmonds, LCSW Phone Number: 04/27/2024, 12:01 PM  Clinical Narrative:   CSW spoke with pt and sister Niels in room, discussed that only additional bed offer pt has received is [facility].  Pt has been at [facility] for approx 55-60 days. Per sister, pt has not been getting bills for copays.  They are interested in [place].    CSW reached out to Brittany[facility]: she is not sure if pt was being billed for copays, will research.   CSW spoke with T[facility], she reviewed, [facility] is able to accept--per [place], pt may have reached his out of pocket max and may be fully covered.  Pt and sister informed, sister is going to go to [place] now and look at the facility.   1345: Pt asking about semi private vs private room: [facility] has private, [facility] would be semi private to start with but private later this week.  Pt and sister informed.  1440: CSW spoke with pt and sister: pt will return to [facility].  [facility] informed, will start auth.  [facility] informed.   PASSR docs uploaded in [facility]  Expected Discharge Plan: Skilled Nursing Facility Barriers to Discharge: Continued Medical Work up, English as a second language teacher, SNF Pending bed offer               Expected Discharge Plan and Services       Living arrangements for the past 2 months: Skilled Nursing Facility                                       Social Drivers of Health (SDOH) Interventions SDOH Screenings   Food Insecurity: No Food Insecurity (02/16/2024)  Housing: Low Risk  (02/16/2024)  Transportation Needs: No Transportation Needs (02/16/2024)  Utilities: Not At Risk (02/16/2024)  Depression (PHQ2-9): Medium Risk (02/11/2023)  Financial Resource Strain: Low Risk  (02/07/2023)  Social Connections:  Moderately Isolated (02/16/2024)  Tobacco Use: High Risk (04/23/2024)    Readmission Risk Interventions     No data to display

## 2024-04-27 NOTE — Plan of Care (Signed)

## 2024-04-27 NOTE — Plan of Care (Signed)
  Problem: Pain Managment: Goal: General experience of comfort will improve and/or be controlled Outcome: Progressing   Problem: Safety: Goal: Ability to remain free from injury will improve Outcome: Progressing   Problem: Skin Integrity: Goal: Risk for impaired skin integrity will decrease Outcome: Progressing

## 2024-04-27 NOTE — Progress Notes (Signed)
 PROGRESS NOTE    Douglas Price  FMW:[id number] DOB: [birth year] DOA: 04/23/2024 PCP: Janey Santos, MD    Brief Narrative:  73 y.o. male with medical history significant of depression, Parkinson's disease, DM 2, GERD, RLS, chronic sacral wound, insomnia, chronic urinary retention with Foley in place comes to the hospital for evaluation of fever.  Patient admitted for severe sepsis secondary to sacral wound infection versus urinary tract infection.  Blood culture started growing GNR.   Assessment & Plan:  Principal Problem:   Sepsis due to cellulitis [facility]) Active Problems:   Parkinson's disease (HCC)   Multiple wounds of skin   Diabetic polyneuropathy associated with type 2 diabetes mellitus (HCC)   Depression   S/P lumbar laminectomy     Severe sepsis secondary to sacral cellulitis/Osteomyelitis.  Proteus mirabilis bacteremia -Continue to follow culture data.    - Blood cultures growing Proteus Mirabella's susceptibilities reviewed.  ID recommending Augmentin  plus doxycycline  for 6 weeks.  Outpatient appointment 05/21/2024 at 10 AM -CT scan did not show any deep infection only skin ulceration/cellulitis.  MRI consistent with sacral osteomyelitis. - General Surgery recommending local wound care and pressure displacement otherwise no surgical I&D at this time.   Wound care recs noted.   Chronic urinary retention, Foley present POA CAUTI BPH - Chronically wears Foley catheter.  Last changed by urology 7/28.  Foley catheter exchanged again in the hospital 8/1 -Flomax    History of Parkinson disease Dysphagia - Continue Sinemet  -Reportedly on regular diet    Diabetes mellitus type 2 Diabetic peripheral neuropathy - Sliding scale and Accu-Chek.   GERD - PPI   Depression/anxiety - Continue home medications   Insomnia - Continue home medications   Essential hypertension - Nebivolol .  IV as needed  Hypomagnesemia/hypokalemia - As needed repletion   TOC  consulted for placement  DVT prophylaxis: Lovenox      Code Status: Full Code Family Communication:  Sister uptodate.  Status is: Inpatient Remains inpatient appropriate because: SNF placement.     Subjective: Doing well no complaints.  Sister present at bedside  Examination:  General exam: Appears calm and comfortable  Respiratory system: Clear to auscultation. Respiratory effort normal. Cardiovascular system: S1 & S2 heard, RRR. No JVD, murmurs, rubs, gallops or clicks. No pedal edema. Gastrointestinal system: Abdomen is nondistended, soft and nontender. No organomegaly or masses felt. Normal bowel sounds heard. Central nervous system: Alert and oriented. No focal neurological deficits. Extremities: Symmetric 5 x 5 power. Skin: No rashes, lesions or ulcers Psychiatry: Judgement and insight appear normal. Mood & affect appropriate. Sacral wound with foul-smelling drainage               Diet Orders (From admission, onward)     Start     Ordered   04/23/24 1644  Diet regular Room service appropriate? Yes; Fluid consistency: Thin  Diet effective now       Question Answer Comment  Room service appropriate? Yes   Fluid consistency: Thin      04/23/24 1644            Objective: Vitals:   04/26/24 1624 04/26/24 2034 04/27/24 0427 04/27/24 0720  BP: 130/83 132/71 (!) 155/96 (!) 151/80  Pulse: 72 70 80 81  Resp:  17 17 18   Temp: 98.5 F (36.9 C) 99.1 F (37.3 C) 98.7 F (37.1 C) 98 F (36.7 C)  TempSrc: Oral Oral Oral Oral  SpO2: 96% 90% 92% 94%  Weight:      Height:  Intake/Output Summary (Last 24 hours) at 04/27/2024 1034 Last data filed at 04/26/2024 1700 Gross per 24 hour  Intake --  Output 500 ml  Net -500 ml   Filed Weights   04/23/24 1812  Weight: 94.2 kg    Scheduled Meds:  amoxicillin -clavulanate  1 tablet Oral Q12H   buPROPion   300 mg Oral Daily   carbidopa -levodopa   1 tablet Oral TID   Chlorhexidine  Gluconate Cloth  6 each  Topical Q0600   [START ON 04/28/2024] collagenase    Topical Daily   doxycycline   100 mg Oral Q12H   DULoxetine   120 mg Oral Daily   dutasteride   0.5 mg Oral Daily   enoxaparin  (LOVENOX ) injection  40 mg Subcutaneous Q24H   feeding supplement  237 mL Oral BID BM   hydrocortisone  cream   Topical BID   insulin  aspart  0-15 Units Subcutaneous TID WC   lithium  carbonate  150 mg Oral QPM   liver oil-zinc  oxide   Topical BID   mirtazapine   15 mg Oral QHS   multivitamin with minerals  1 tablet Oral Daily   nebivolol   2.5 mg Oral Daily   nutrition supplement (JUVEN)  1 packet Oral BID BM   pantoprazole   40 mg Oral Daily   polyethylene glycol  17 g Oral BID   sodium hypochlorite   Irrigation BID   tamsulosin   0.4 mg Oral q1800   Continuous Infusions:  Nutritional status Signs/Symptoms: estimated needs Interventions: Ensure Enlive (each supplement provides 350kcal and 20 grams of protein), MVI, Juven Body mass index is 25.96 kg/m.  Data Reviewed:   CBC: Recent Labs  Lab 04/23/24 1532 04/23/24 1643 04/24/24 0919 04/25/24 0804 04/26/24 0605 04/27/24 0707  WBC 20.6* 17.3* 14.8* 14.3* 13.8* 12.5*  NEUTROABS 18.0*  --   --   --   --   --   HGB 11.4* 9.5* 9.8* 10.0* 10.0* 10.5*  HCT 36.1* 30.4* 30.5* 30.8* 30.8* 32.7*  MCV 90.9 92.1 90.5 89.5 88.8 89.8  PLT 424* 357 350 376 384 408*   Basic Metabolic Panel: Recent Labs  Lab 04/23/24 1532 04/23/24 1643 04/24/24 0919 04/25/24 0804 04/26/24 0605 04/27/24 0707  NA 135  --  132* 135 135 137  K 4.2  --  3.5 3.6 3.3* 4.0  CL 100  --  100 103 102 104  CO2 23  --  21* 23 24 27   GLUCOSE 116*  --  112* 114* 116* 105*  BUN 22  --  19 13 15 13   CREATININE 0.70 0.80 0.69 0.78 0.58* 0.62  CALCIUM 8.7*  --  8.0* 8.1* 8.0* 8.2*  MG  --   --  1.3* 1.7 1.5* 1.7  PHOS  --   --  3.9  --   --   --    GFR: Estimated Creatinine Clearance: 102.7 mL/min (by C-G formula based on SCr of 0.62 mg/dL). Liver Function Tests: Recent Labs  Lab  04/23/24 1532  AST 18  ALT 16  ALKPHOS 65  BILITOT 0.7  PROT 6.5  ALBUMIN  2.6*   No results for input(s): LIPASE, AMYLASE in the last 168 hours. No results for input(s): AMMONIA in the last 168 hours. Coagulation Profile: Recent Labs  Lab 04/23/24 1532  INR 1.2   Cardiac Enzymes: No results for input(s): CKTOTAL, CKMB, CKMBINDEX, TROPONINI in the last 168 hours. BNP (last 3 results) No results for input(s): PROBNP in the last 8760 hours. HbA1C: No results for input(s): HGBA1C in the last 72 hours.  CBG: Recent Labs  Lab 04/26/24 0735 04/26/24 1208 04/26/24 1623 04/26/24 2154 04/27/24 0601  GLUCAP 100* 111* 100* 122* 99   Lipid Profile: No results for input(s): CHOL, HDL, LDLCALC, TRIG, CHOLHDL, LDLDIRECT in the last 72 hours. Thyroid Function Tests: No results for input(s): TSH, T4TOTAL, FREET4, T3FREE, THYROIDAB in the last 72 hours. Anemia Panel: No results for input(s): VITAMINB12, FOLATE, FERRITIN, TIBC, IRON, RETICCTPCT in the last 72 hours. Sepsis Labs: Recent Labs  Lab 04/23/24 1537 04/23/24 1726  LATICACIDVEN 2.5* 2.4*    Recent Results (from the past 240 hours)  Blood Culture (routine x 2)     Status: Abnormal   Collection Time: 04/23/24  2:52 PM   Specimen: BLOOD  Result Value Ref Range Status   Specimen Description BLOOD SITE NOT SPECIFIED  Final   Special Requests   Final    BOTTLES DRAWN AEROBIC AND ANAEROBIC Blood Culture adequate volume   Culture  Setup Time   Final    GRAM NEGATIVE RODS IN BOTH AEROBIC AND ANAEROBIC BOTTLES CRITICAL RESULT CALLED TO, READ BACK BY AND VERIFIED WITH: MAYA MAFFUCCI [id number] FCP Performed at [facility], [address], [place], [place] [address]    Culture PROTEUS MIRABILIS (A)  Final   Report Status 04/26/2024 FINAL  Final   Organism ID, Bacteria PROTEUS MIRABILIS  Final   Organism ID, Bacteria PROTEUS MIRABILIS  Final      Susceptibility    Proteus mirabilis - KIRBY BAUER*    CEFAZOLIN  RESISTANT Resistant    Proteus mirabilis - MIC*    AMPICILLIN  <=2 SENSITIVE Sensitive     CEFEPIME  <=0.12 SENSITIVE Sensitive     CEFTAZIDIME <=1 SENSITIVE Sensitive     CEFTRIAXONE  <=0.25 SENSITIVE Sensitive     CIPROFLOXACIN <=0.25 SENSITIVE Sensitive     GENTAMICIN <=1 SENSITIVE Sensitive     IMIPENEM 8 INTERMEDIATE Intermediate     TRIMETH/SULFA <=20 SENSITIVE Sensitive     AMPICILLIN /SULBACTAM <=2 SENSITIVE Sensitive     PIP/TAZO <=4 SENSITIVE Sensitive ug/mL    * PROTEUS MIRABILIS    PROTEUS MIRABILIS  Blood Culture ID Panel (Reflexed)     Status: Abnormal   Collection Time: 04/23/24  2:52 PM  Result Value Ref Range Status   Enterococcus faecalis NOT DETECTED NOT DETECTED Final   Enterococcus Faecium NOT DETECTED NOT DETECTED Final   Listeria monocytogenes NOT DETECTED NOT DETECTED Final   Staphylococcus species NOT DETECTED NOT DETECTED Final   Staphylococcus aureus (BCID) NOT DETECTED NOT DETECTED Final   Staphylococcus epidermidis NOT DETECTED NOT DETECTED Final   Staphylococcus lugdunensis NOT DETECTED NOT DETECTED Final   Streptococcus species NOT DETECTED NOT DETECTED Final   Streptococcus agalactiae NOT DETECTED NOT DETECTED Final   Streptococcus pneumoniae NOT DETECTED NOT DETECTED Final   Streptococcus pyogenes NOT DETECTED NOT DETECTED Final   A.calcoaceticus-baumannii NOT DETECTED NOT DETECTED Final   Bacteroides fragilis NOT DETECTED NOT DETECTED Final   Enterobacterales DETECTED (A) NOT DETECTED Final    Comment: Enterobacterales represent a large order of gram negative bacteria, not a single organism. CRITICAL RESULT CALLED TO, READ BACK BY AND VERIFIED WITH: PHARMD BLAKE [id number] 0722 FCP    Enterobacter cloacae complex NOT DETECTED NOT DETECTED Final   Escherichia coli NOT DETECTED NOT DETECTED Final   Klebsiella aerogenes NOT DETECTED NOT DETECTED Final   Klebsiella oxytoca NOT DETECTED NOT DETECTED Final    Klebsiella pneumoniae NOT DETECTED NOT DETECTED Final   Proteus species DETECTED (A) NOT DETECTED Final  Comment: CRITICAL RESULT CALLED TO, READ BACK BY AND VERIFIED WITH: PHARMD BLAKE [id number] 0722 FCP    Salmonella species NOT DETECTED NOT DETECTED Final   Serratia marcescens NOT DETECTED NOT DETECTED Final   Haemophilus influenzae NOT DETECTED NOT DETECTED Final   Neisseria meningitidis NOT DETECTED NOT DETECTED Final   Pseudomonas aeruginosa NOT DETECTED NOT DETECTED Final   Stenotrophomonas maltophilia NOT DETECTED NOT DETECTED Final   Candida albicans NOT DETECTED NOT DETECTED Final   Candida auris NOT DETECTED NOT DETECTED Final   Candida glabrata NOT DETECTED NOT DETECTED Final   Candida krusei NOT DETECTED NOT DETECTED Final   Candida parapsilosis NOT DETECTED NOT DETECTED Final   Candida tropicalis NOT DETECTED NOT DETECTED Final   Cryptococcus neoformans/gattii NOT DETECTED NOT DETECTED Final   CTX-M ESBL NOT DETECTED NOT DETECTED Final   Carbapenem resistance IMP NOT DETECTED NOT DETECTED Final   Carbapenem resistance KPC NOT DETECTED NOT DETECTED Final   Carbapenem resistance NDM NOT DETECTED NOT DETECTED Final   Carbapenem resist OXA 48 LIKE NOT DETECTED NOT DETECTED Final   Carbapenem resistance VIM NOT DETECTED NOT DETECTED Final    Comment: Performed at [facility], [address] [place], [place] [address]  Urine Culture     Status: Abnormal   Collection Time: 04/23/24  3:32 PM   Specimen: Urine, Random  Result Value Ref Range Status   Specimen Description URINE, RANDOM  Final   Special Requests   Final    NONE Reflexed from [id number] Performed at [facility], [address] [place], [place] [address]    Culture MULTIPLE SPECIES PRESENT, SUGGEST RECOLLECTION (A)  Final   Report Status 04/24/2024 FINAL  Final  Resp panel by RT-PCR (RSV, Flu A&B, Covid) Anterior Nasal Swab     Status: None   Collection Time: 04/23/24  5:03 PM   Specimen: Anterior  Nasal Swab  Result Value Ref Range Status   SARS Coronavirus 2 by RT PCR NEGATIVE NEGATIVE Final   Influenza A by PCR NEGATIVE NEGATIVE Final   Influenza B by PCR NEGATIVE NEGATIVE Final    Comment: (NOTE) The Xpert Xpress SARS-CoV-2/FLU/RSV plus assay is intended as an aid in the diagnosis of influenza from Nasopharyngeal swab specimens and should not be used as a sole basis for treatment. Nasal washings and aspirates are unacceptable for Xpert Xpress SARS-CoV-2/FLU/RSV testing.  Fact Sheet for Patients: BloggerCourse.com  Fact Sheet for Healthcare Providers: SeriousBroker.it  This test is not yet approved or cleared by the [place]  FDA and has been authorized for detection and/or diagnosis of SARS-CoV-2 by FDA under an Emergency Use Authorization (EUA). This EUA will remain in effect (meaning this test can be used) for the duration of the COVID-19 declaration under Section 564(b)(1) of the Act, 21 U.S.C. section 360bbb-3(b)(1), unless the authorization is terminated or revoked.     Resp Syncytial Virus by PCR NEGATIVE NEGATIVE Final    Comment: (NOTE) Fact Sheet for Patients: BloggerCourse.com  Fact Sheet for Healthcare Providers: SeriousBroker.it  This test is not yet approved or cleared by the [place]  FDA and has been authorized for detection and/or diagnosis of SARS-CoV-2 by FDA under an Emergency Use Authorization (EUA). This EUA will remain in effect (meaning this test can be used) for the duration of the COVID-19 declaration under Section 564(b)(1) of the Act, 21 U.S.C. section 360bbb-3(b)(1), unless the authorization is terminated or revoked.  Performed at [facility], [address] [place],  [place] [address]   Blood Culture (routine x 2)     Status: None (Preliminary result)   Collection Time: 04/23/24  6:25 PM   Specimen: BLOOD LEFT HAND   Result Value Ref Range Status   Specimen Description BLOOD LEFT HAND  Final   Special Requests   Final    BOTTLES DRAWN AEROBIC ONLY Blood Culture results may not be optimal due to an inadequate volume of blood received in culture bottles   Culture   Final    NO GROWTH 4 DAYS Performed at [facility], [address] [place], [place] [address]    Report Status PENDING  Incomplete  MRSA Next Gen by PCR, Nasal     Status: None   Collection Time: 04/24/24  7:52 AM   Specimen: Nasal Mucosa; Nasal Swab  Result Value Ref Range Status   MRSA by PCR Next Gen NOT DETECTED NOT DETECTED Final    Comment: (NOTE) The GeneXpert MRSA Assay (FDA approved for NASAL specimens only), is one component of a comprehensive MRSA colonization surveillance program. It is not intended to diagnose MRSA infection nor to guide or monitor treatment for MRSA infections. Test performance is not FDA approved in patients less than 42 years old. Performed at [facility], [address] [place], [place] [address]          Radiology Studies: No results found.         LOS: 4 days   Time spent= 35 mins    Burgess JAYSON Dare, MD [facility]  If 7PM-7AM, please contact night-coverage  04/27/2024, 10:34 AM

## 2024-04-28 DIAGNOSIS — A419 Sepsis, unspecified organism: Secondary | ICD-10-CM | POA: Diagnosis not present

## 2024-04-28 DIAGNOSIS — L0390 Cellulitis, unspecified: Secondary | ICD-10-CM | POA: Diagnosis not present

## 2024-04-28 LAB — CULTURE, BLOOD (ROUTINE X 2): Culture: NO GROWTH

## 2024-04-28 LAB — CBC
HCT: 32 % — ABNORMAL LOW (ref 39.0–52.0)
Hemoglobin: 10.2 g/dL — ABNORMAL LOW (ref 13.0–17.0)
MCH: 28.6 pg (ref 26.0–34.0)
MCHC: 31.9 g/dL (ref 30.0–36.0)
MCV: 89.6 fL (ref 80.0–100.0)
Platelets: 437 K/uL — ABNORMAL HIGH (ref 150–400)
RBC: 3.57 MIL/uL — ABNORMAL LOW (ref 4.22–5.81)
RDW: 14 % (ref 11.5–15.5)
WBC: 13.1 K/uL — ABNORMAL HIGH (ref 4.0–10.5)
nRBC: 0 % (ref 0.0–0.2)

## 2024-04-28 LAB — BASIC METABOLIC PANEL WITH GFR
Anion gap: 6 (ref 5–15)
BUN: 12 mg/dL (ref 8–23)
CO2: 26 mmol/L (ref 22–32)
Calcium: 8.3 mg/dL — ABNORMAL LOW (ref 8.9–10.3)
Chloride: 106 mmol/L (ref 98–111)
Creatinine, Ser: 0.63 mg/dL (ref 0.61–1.24)
GFR, Estimated: >60 mL/min (ref >60)
Glucose, Bld: 109 mg/dL — ABNORMAL HIGH (ref 70–99)
Potassium: 3.7 mmol/L (ref 3.5–5.1)
Sodium: 138 mmol/L (ref 135–145)

## 2024-04-28 LAB — MAGNESIUM: Magnesium: 1.7 mg/dL (ref 1.7–2.4)

## 2024-04-28 LAB — GLUCOSE, CAPILLARY
Glucose-Capillary: 100 mg/dL — ABNORMAL HIGH (ref 70–99)
Glucose-Capillary: 119 mg/dL — ABNORMAL HIGH (ref 70–99)
Glucose-Capillary: 97 mg/dL (ref 70–99)
Glucose-Capillary: 109 mg/dL — ABNORMAL HIGH (ref 70–99)

## 2024-04-28 NOTE — Progress Notes (Signed)
 Nutrition Follow Up  DOCUMENTATION CODES:  Not applicable  INTERVENTION:  Multivitamin w/ minerals daily Ensure Plus High Protein po BID, each supplement provides 350 kcal and 20 grams of protein Double protein portions  Juven po BID, each packet provides 95 calories, 2.5 grams of protein (collagen), and 9.8 grams of carbohydrate (3 grams sugar); also contains 7 grams of L-arginine and L-glutamine, 300 mg vitamin C, 15 mg vitamin E, 1.2 mcg vitamin B-12, 9.5 mg zinc , 200 mg calcium, and 1.5 g  Calcium Beta-hydroxy-Beta-methylbutyrate to support wound healing Encourage good PO intake   NUTRITION DIAGNOSIS:  Increased nutrient needs related to wound healing as evidenced by estimated needs.  GOAL:  Patient will meet greater than or equal to 90% of their needs  MONITOR:  PO intake, Supplement acceptance, Weight trends, Skin  REASON FOR ASSESSMENT:  Consult Wound healing  ASSESSMENT:  73 y.o. male presented to the ED with fever and worsening sacral wound. PMH includes depression, Parkinson's Disease, T2DM, GERD, and BPH. Pt admitted with sepsis due to sacral wound.   Attempted to see pt for follow up multiple times, but pt unavailable. Will continue current interventions until discharge. Discharge orders signed, pt discharging to SNF.  Average Meal Completion: 8/1-8/4: 100% average intake x 5 recorded meals  Nutrition Related Medications:  Doxycycline   SSI novolog  TID MVI w/ minerals Protonix  Miralax   Labs:  Hgb A1c 5.6 (02/14/24)  CBG 99-119 mg/dL x 24 hrs    NUTRITION - FOCUSED PHYSICAL EXAM:   Diet Order:   Diet Order             Diet regular Room service appropriate? Yes; Fluid consistency: Thin  Diet effective now                  EDUCATION NEEDS:  No education needs have been identified at this time  Skin:  Skin Assessment: Skin Integrity Issues: Skin Integrity Issues:: Unstageable, Other (Comment) Unstageable: sacrum Other: wound on coccyx Per WOC  note on 04/24/24:  - Unstageable Pressure Injury; sacrum; + MRI (osteomyelitis)   Last BM:  8/2 type 6  Height:  Ht Readings from Last 1 Encounters:  04/23/24 6' 3 (1.905 m)   Weight:  Wt Readings from Last 1 Encounters:  04/23/24 94.2 kg   Ideal Body Weight:  89.1 kg  BMI:  Body mass index is 25.96 kg/m.  Estimated Nutritional Needs:  Kcal:  2200-2400 Protein:  110-130 grams Fluid:  >/= 2 L   Josette Glance, MS, RDN, LDN Clinical Dietitian I Please reach out via secure chat

## 2024-04-28 NOTE — Plan of Care (Signed)
  Problem: Coping: Goal: Ability to adjust to condition or change in health will improve Outcome: Progressing   Problem: Fluid Volume: Goal: Ability to maintain a balanced intake and output will improve Outcome: Progressing   Problem: Metabolic: Goal: Ability to maintain appropriate glucose levels will improve Outcome: Progressing   Problem: Nutritional: Goal: Maintenance of adequate nutrition will improve Outcome: Progressing Goal: Progress toward achieving an optimal weight will improve Outcome: Progressing   Problem: Skin Integrity: Goal: Risk for impaired skin integrity will decrease Outcome: Progressing   Problem: Tissue Perfusion: Goal: Adequacy of tissue perfusion will improve Outcome: Progressing

## 2024-04-28 NOTE — Progress Notes (Signed)
 Physical Therapy Wound Treatment Patient Details  Name: Douglas Price MRN: [id number] Date of Birth: 1951/06/03  Today's Date: 04/28/2024 Time: 9094-9066 Time Calculation (min): 28 min  Subjective  Subjective Assessment Patient and Family Stated Goals: heal wounds Date of Onset:  (unknown) Prior Treatments: dressing change, pressure redistribution chair pad  Pain Score:  Pt premedicated facial pain 6/10   Wound Assessment  Wound 04/24/24 1222 Pressure Injury Sacrum Posterior (Active)  Wound Image   04/24/24 1400  Site / Wound Assessment Painful;Yellow;Red;Black 04/27/24 2205  Peri-wound Assessment Erythema (blanchable) 04/27/24 2205  Wound Length (cm) 6.3 cm 04/24/24 1400  Wound Width (cm) 4.2 cm 04/24/24 1400  Wound Surface Area (cm^2) 20.78 cm^2 04/24/24 1400  Wound Depth (cm) 3.1 cm 04/24/24 1400  Wound Volume (cm^3) 42.949 cm^3 04/24/24 1400  Drainage Description Serous 04/25/24 0600  Drainage Amount Large 04/27/24 2205  Treatment Cleansed;Packing (Saline gauze) 04/27/24 2205  Treatments Cleansed 04/27/24 2205  Dressing Type Dakin's-soaked gauze 04/27/24 2205  Dressing Changed Changed 04/27/24 2205  Dressing Status Clean, Dry, Intact 04/27/24 2205  State of Healing Non-healing 04/27/24 2205  % Wound base Red or Granulating 30% 04/24/24 1400  % Wound base Yellow/Fibrinous Exudate 30% 04/24/24 1400  % Wound base Black/Eschar 10% 04/24/24 1400  % Wound base Other/Granulation Tissue (Comment) 30% 04/24/24 1400  Undermining (cm) 12 oclock 0.8 cm, 3 o'clock 2.2 cm, 6 o'clock 1.8cm, 9 o'clock 2.8cm 04/24/24 1400  Margins Unattached edges (unapproximated) 04/27/24 2205  Non-staged Wound Description Full thickness 04/27/24 2205   Selective Debridement (non-excisional) Selective Debridement (non-excisional) - Location: sacrum Selective Debridement (non-excisional) - Tools Used: Forceps, Scalpel, Scissors Selective Debridement (non-excisional) - Tissue Removed: yellow stringy  slough from wound bed and brownish softened eschar from the rim of the wound    Wound Assessment and Plan  Wound Therapy - Assess/Plan/Recommendations Wound Therapy - Clinical Statement: Wound has progressed with increasing amounts of granualation tissue in the wound bed and softened eschar around the rim of the wound which was removed with selective debridement. Pt is planning to discharge to SNF today and with frequent dressing changes and Santy the wound will continue to progress towards healing stage. Wound Therapy - Functional Problem List: decresed mobility, Parkinson's Factors Delaying/Impairing Wound Healing: Incontinence, Immobility, Diabetes Mellitus, Multiple medical problems Hydrotherapy Plan: Patient/family education, Dressing change, Debridement Wound Therapy - Frequency: 2X / week Wound Therapy - Current Recommendations: Surgery consult Wound Therapy - Follow Up Recommendations: dressing changes by RN  Wound Therapy Goals- Improve the function of patient's integumentary system by progressing the wound(s) through the phases of wound healing (inflammation - proliferation - remodeling) by: Wound Therapy Goals - Improve the function of patient's integumentary system by progressing the wound(s) through the phases of wound healing by: Decrease Necrotic Tissue to: 60 Decrease Necrotic Tissue - Progress: Progressing toward goal Increase Granulation Tissue to: 35 Increase Granulation Tissue - Progress: Progressing toward goal Improve Drainage Characteristics: Min Improve Drainage Characteristics - Progress: Met Goals/treatment plan/discharge plan were made with and agreed upon by patient/family: Yes Time For Goal Achievement: 7 days Wound Therapy - Potential for Goals: Fair  Goals will be updated until maximal potential achieved or discharge criteria met.  Discharge criteria: when goals achieved, discharge from hospital, MD decision/surgical intervention, no progress towards goals,  refusal/missing three consecutive treatments without notification or medical reason.  GP     Charges PT Wound Care Charges $Wound Debridement up to 20 cm: < or equal to 20 cm $PT Hydrotherapy Dressing: 1  dressing     Almarie NOVAK. Fleeta Lapidus PT, DPT Acute Rehabilitation Services Please use secure chat or  Call Office [phone]   Almarie NOVAK Fleeta [facility] 04/28/2024, 4:16 PM

## 2024-04-28 NOTE — Discharge Summary (Signed)
 Physician Discharge Summary  Douglas Price FMW:[id number] DOB: Feb 11, 1951 DOA: 04/23/2024  PCP: [name], [name], MD  Admit date: 04/23/2024 Discharge date: 04/30/2024  Admitted From: SNF Disposition: SNF  Recommendations for Outpatient Follow-up:  Follow up with PCP in 1-2 weeks Please obtain BMP/CBC in one week your next doctors visit.  P.o. Augmentin  and doxycycline  for total of 6 weeks All patient infectious disease appointment 05/21/2024 at 10 AM Wound care recommendations : Apply 1/4% Dakins solution to kerlix or 4x4 gauze, pack into sacral wound, cover with ABD pad, secure with tape. Change BID  Foley catheter exchanged in the hospital on 04/24/2024 It would benefit for patient to offload any pressure areas periodically.  Would also benefit from possible hospital bed with railings so he is able to reposition himself with the help of side rails.   Discharge Condition: Stable CODE STATUS: Full code Diet recommendation: Diabetic  Brief/Interim Summary: Brief Narrative:  73 y.o. male with medical history significant of depression, Parkinson's disease, DM 2, GERD, RLS, chronic sacral wound, insomnia, chronic urinary retention with Foley in place comes to the hospital for evaluation of fever.  Patient admitted for severe sepsis secondary to sacral wound infection versus urinary tract infection.  Blood culture started growing GNR. Blood cultures growing Proteus Mirabella's susceptibilities reviewed.  ID recommending Augmentin  plus doxycycline  for 6 weeks.  Outpatient appointment 05/21/2024 at 10 AM. General Surgery recommending local wound care and pressure displacement otherwise no surgical I&D at this time. Wound care recs noted as below. Wound appears to be much better today.  Medically stable for discharge.   Assessment & Plan:  Principal Problem:   Sepsis due to cellulitis [facility]) Active Problems:   Parkinson's disease (HCC)   Multiple wounds of skin   Diabetic polyneuropathy  associated with type 2 diabetes mellitus (HCC)   Depression   S/P lumbar laminectomy     Severe sepsis secondary to sacral cellulitis/Osteomyelitis.  Proteus mirabilis bacteremia Blood cultures growing Proteus Mirabella's susceptibilities reviewed.  ID recommending Augmentin  plus doxycycline  for 6 weeks.  Outpatient appointment 05/21/2024 at 10 AM -CT scan did not show any deep infection only skin ulceration/cellulitis.  MRI consistent with sacral osteomyelitis. - General Surgery recommending local wound care and pressure displacement otherwise no surgical I&D at this time.   Wound care recs : Apply 1/4% Dakins solution to kerlix or 4x4 gauze, pack into sacral wound, cover with ABD pad, secure with tape. Change BID   Chronic urinary retention, Foley present POA CAUTI BPH - Chronically wears Foley catheter.  Last changed by urology 7/28.  Foley catheter exchanged again in the hospital 8/1 -Flomax    History of Parkinson disease Dysphagia - Continue Sinemet  -Reportedly on regular diet    Diabetes mellitus type 2 Diabetic peripheral neuropathy - Sliding scale and Accu-Chek.   GERD - PPI   Depression/anxiety - Continue home medications   Insomnia - Continue home medications   Essential hypertension - Nebivolol .  IV as needed  Hypomagnesemia/hypokalemia - As needed repletion   TOC consulted for placement  DVT prophylaxis: Lovenox      Code Status: Full Code Family Communication:  Sister uptodate.  Status is: Inpatient Remains inpatient appropriate because: SNF placement.     Subjective: NO complaints, waiting for placement and insurance auth  Examination:  General exam: Appears calm and comfortable  Respiratory system: Clear to auscultation. Respiratory effort normal. Cardiovascular system: S1 & S2 heard, RRR. No JVD, murmurs, rubs, gallops or clicks. No pedal edema. Gastrointestinal system: Abdomen is nondistended, soft  and nontender. No organomegaly or masses  felt. Normal bowel sounds heard. Central nervous system: Alert and oriented. No focal neurological deficits. Extremities: Symmetric 5 x 5 power. Skin: No rashes, lesions or ulcers Psychiatry: Judgement and insight appear normal. Mood & affect appropriate. Sacral wound with foul-smelling drainage   Discharge Diagnoses:  Principal Problem:   Sepsis due to cellulitis [facility]) Active Problems:   Parkinson's disease (HCC)   Multiple wounds of skin   Diabetic polyneuropathy associated with type 2 diabetes mellitus (HCC)   Depression   S/P lumbar laminectomy      Discharge Exam: Vitals:   04/30/24 0404 04/30/24 0749  BP: (!) 140/86 (!) 153/84  Pulse: 67 72  Resp:  16  Temp: 97.9 F (36.6 C) 98.4 F (36.9 C)  SpO2: 94% 91%   Vitals:   04/29/24 1524 04/29/24 2025 04/30/24 0404 04/30/24 0749  BP: (!) 144/74 (!) 140/77 (!) 140/86 (!) 153/84  Pulse: 79 76 67 72  Resp: 16 16  16   Temp: 99.5 F (37.5 C) 99 F (37.2 C) 97.9 F (36.6 C) 98.4 F (36.9 C)  TempSrc: Oral Oral Oral Oral  SpO2: 94% 94% 94% 91%  Weight:      Height:          Discharge Instructions   Allergies as of 04/30/2024       Reactions   Avelox [moxifloxacin] Other (See Comments)   Angioedema Fatigue   Quinolones Other (See Comments)   Angioedema   Lexapro [escitalopram] Other (See Comments)   unknown        Medication List     STOP taking these medications    HYDROcodone -acetaminophen  5-325 MG tablet Commonly known as: NORCO/VICODIN       TAKE these medications    amoxicillin -clavulanate 875-125 MG tablet Commonly known as: AUGMENTIN  Take 1 tablet by mouth every 12 (twelve) hours.   bisacodyl  5 MG EC tablet Commonly known as: DULCOLAX Take 2 tablets (10 mg total) by mouth daily as needed for moderate constipation.   buPROPion  300 MG 24 hr tablet Commonly known as: WELLBUTRIN  XL TAKE 1 TABLET(300 MG) BY MOUTH DAILY What changed: See the new instructions.   carbidopa -levodopa   25-100 MG tablet Commonly known as: SINEMET  IR Take 1 tablet by mouth 3 (three) times daily.   clonazePAM  0.5 MG tablet Commonly known as: KlonoPIN  Take 1 tablet (0.5 mg total) by mouth 2 (two) times daily as needed for anxiety.   collagenase  250 UNIT/GM ointment Commonly known as: SANTYL  Apply topically daily.   doxycycline  100 MG tablet Commonly known as: VIBRA -TABS Take 1 tablet (100 mg total) by mouth every 12 (twelve) hours.   DULoxetine  60 MG capsule Commonly known as: CYMBALTA  Take 120 mg by mouth daily.   dutasteride  0.5 MG capsule Commonly known as: AVODART  Take 0.5 mg by mouth daily.   hydrocortisone  cream 1 % Apply topically 2 (two) times daily.   lithium  carbonate 150 MG capsule TAKE 1 CAPSULE(150 MG) BY MOUTH EVERY EVENING   liver oil-zinc  oxide 40 % ointment Commonly known as: DESITIN Apply topically 2 (two) times daily.   loratadine 10 MG tablet Commonly known as: CLARITIN Take 10 mg by mouth daily as needed for allergies, rhinitis or itching.   metFORMIN  500 MG tablet Commonly known as: GLUCOPHAGE  Take 1,000 mg by mouth 2 (two) times daily with a meal.   mirtazapine  15 MG tablet Commonly known as: REMERON  Take 15 mg by mouth at bedtime.   multivitamin with minerals Tabs tablet  Take 1 tablet by mouth daily.   nebivolol  2.5 MG tablet Commonly known as: BYSTOLIC  Take 2.5 mg by mouth daily.   nutrition supplement (JUVEN) Pack Take 1 packet by mouth 2 (two) times daily between meals.   feeding supplement Liqd Take 237 mLs by mouth 2 (two) times daily between meals.   omeprazole 20 MG capsule Commonly known as: PRILOSEC Take 20 mg by mouth 2 (two) times daily before a meal.   oxyCODONE  5 MG immediate release tablet Commonly known as: Oxy IR/ROXICODONE  Take 1-2 tablets (5-10 mg total) by mouth every 4 (four) hours as needed for moderate pain (pain score 4-6).   polyethylene glycol 17 g packet Commonly known as: MIRALAX  / GLYCOLAX  Take 17 g  by mouth daily.   pregabalin  75 MG capsule Commonly known as: LYRICA  Take 1 capsule (75 mg total) by mouth 2 (two) times daily.   tamsulosin  0.4 MG Caps capsule Commonly known as: FLOMAX  Take 0.4 mg by mouth daily.   telmisartan  40 MG tablet Commonly known as: MICARDIS  Take 40 mg by mouth daily.   temazepam  15 MG capsule Commonly known as: RESTORIL  Take 1 capsule (15 mg total) by mouth at bedtime as needed for sleep. What changed:  when to take this reasons to take this        Follow-up Information     [name], [name], MD Follow up in 1 week(s).   Specialty: Internal Medicine Contact information: [address] [place] [address] [phone]                Allergies  Allergen Reactions   Avelox [Moxifloxacin] Other (See Comments)    Angioedema Fatigue   Quinolones Other (See Comments)    Angioedema   Lexapro [Escitalopram] Other (See Comments)    unknown    You were cared for by a hospitalist during your hospital stay. If you have any questions about your discharge medications or the care you received while you were in the hospital after you are discharged, you can call the unit and asked to speak with the hospitalist on call if the hospitalist that took care of you is not available. Once you are discharged, your primary care physician will handle any further medical issues. Please note that no refills for any discharge medications will be authorized once you are discharged, as it is imperative that you return to your primary care physician (or establish a relationship with a primary care physician if you do not have one) for your aftercare needs so that they can reassess your need for medications and monitor your lab values.  You were cared for by a hospitalist during your hospital stay. If you have any questions about your discharge medications or the care you received while you were in the hospital after you are discharged, you can call the unit and  asked to speak with the hospitalist on call if the hospitalist that took care of you is not available. Once you are discharged, your primary care physician will handle any further medical issues. Please note that NO REFILLS for any discharge medications will be authorized once you are discharged, as it is imperative that you return to your primary care physician (or establish a relationship with a primary care physician if you do not have one) for your aftercare needs so that they can reassess your need for medications and monitor your lab values.  Please request your Prim.MD to go over all Hospital Tests and Procedure/Radiological results at the follow up,  please get all Hospital records sent to your Prim MD by signing hospital release before you go home.  Get CBC, CMP, 2 view Chest X ray checked  by Primary MD during your next visit or SNF MD in 5-7 days ( we routinely change or add medications that can affect your baseline labs and fluid status, therefore we recommend that you get the mentioned basic workup next visit with your PCP, your PCP may decide not to get them or add new tests based on their clinical decision)  On your next visit with your primary care physician please Get Medicines reviewed and adjusted.  If you experience worsening of your admission symptoms, develop shortness of breath, life threatening emergency, suicidal or homicidal thoughts you must seek medical attention immediately by calling 911 or calling your MD immediately  if symptoms less severe.  You Must read complete instructions/literature along with all the possible adverse reactions/side effects for all the Medicines you take and that have been prescribed to you. Take any new Medicines after you have completely understood and accpet all the possible adverse reactions/side effects.   Do not drive, operate heavy machinery, perform activities at heights, swimming or participation in water  activities or provide baby sitting  services if your were admitted for syncope or siezures until you have seen by Primary MD or a Neurologist and advised to do so again.  Do not drive when taking Pain medications.   Procedures/Studies: MR Lumbar Spine W Wo Contrast Result Date: 04/24/2024 CLINICAL DATA:  73 year old male with chronic myelopathy. Query spinal infection. EXAM: MRI LUMBAR SPINE WITHOUT AND WITH CONTRAST TECHNIQUE: Multiplanar and multiecho pulse sequences of the lumbar spine were obtained without and with intravenous contrast. CONTRAST:  9mL GADAVIST  GADOBUTROL  1 MMOL/ML IV SOLN COMPARISON:  Sacral MRI the same day reported separately. Lumbar MRI 10/31/2018. CT Abdomen and Pelvis 04/23/2024. FINDINGS: Segmentation: Normal on the CT yesterday which is the same numbering system used on the 2020 MRI. Alignment: Lumbar lordosis with chronic grade 1 anterolisthesis of L4 on L5. Underlying levoconvex lumbar scoliosis is moderate, better demonstrated on the recent CT. Degenerative appearing retrolisthesis of L1 on L2 is chronic but progressed since 2020. Vertebrae: Chronic postoperative changes with hardware susceptibility artifact L2 through L5. Normal background bone marrow signal. CT evidence of solid arthrodesis through those levels, and also degenerative interbody ankylosis at L5-S1. Visible lower thoracic levels and L1 vertebrae appear intact with maintained height. L1 benign vertebral body hemangioma (normal variant). Sacrum is described separately. No lower thoracic or lumbar marrow edema or acute osseous abnormality. Conus medullaris and cauda equina: Conus extends to the T12-L1 level. No lower spinal cord or conus signal abnormality. Fatty filum terminalis, normal variant. No abnormal intradural enhancement. No dural thickening. Paraspinal and other soft tissues: Postoperative changes to the posterior lumbar paraspinal soft tissues, no change since 2020. No lumbar paraspinal fluid collection. Stable visible abdominal viscera.  Disc levels: T11-T12 and T12-L1: Negative, and interbody ankylosis at those levels from Diffuse idiopathic skeletal hyperostosis (DISH). Demonstrated on CT yesterday. L1-L2: Severe disc space loss with vacuum disc by CT. Mild retrolisthesis, chronic but increased since 2020. Moderate to severe multifactorial spinal stenosis (series 8, image 13 and series 5, image 9. Moderate left and moderate to severe right L1 foraminal stenosis. This level has progressed since 2020. L2-L3:  Decompression and fusion with no adverse features. L3-L4:  Decompression and fusion with no adverse features. L4-L5: Decompression and fusion. Chronic severe right side osseous L4 foraminal stenosis, stable since  2020. L5-S1: Interbody ankylosis. Bulky circumferential disc osteophyte complex. No spinal stenosis. Mild to moderate chronic left lateral recess stenosis is stable since 2020. Moderate left greater than right L5 foraminal stenosis also stable. IMPRESSION: 1. No evidence of Lumbar Spine infection. See Sacrum MRI reported separately. 2. 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 Chronic lumbar fusion L2 through L5. And degenerative interbody ankylosis at L5-S1. 3. 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 Electronically Signed   By: VEAR Hurst M.D.   On: 04/24/2024 09:07   MR SACRUM SI JOINTS W WO CONTRAST Result Date: 04/24/2024 CLINICAL DATA:  Chronic myelopathy, for infection. Parkinson's disease. Chronic sacral wound. Chronic urinary retention. EXAM: MRI SACRUM WITHOUT CONTRAST TECHNIQUE: Multiplanar multi-sequence MR imaging of the sacrum was performed. No intravenous contrast was administered. COMPARISON:  CT pelvis 04/23/2024 FINDINGS: OSSEOUS STRUCTURES AND JOINTS: Abnormal edema signal compatible with osteomyelitis involving the lower fifth segment of the sacrum and upper coccyx as on  image 13 series 6 with deep overlying sacral decubitus ulcer extending essentially to the periosteal margin as shown on image 9 series 13. Postoperative findings in the lower lumbar spine, please see dedicated lumbar spine MRI report. Mild spurring of the SI joints but no SI joint effusion. Trace presacral edema. MUSCULOTENDINOUS: Unremarkable SACRAL PLEXUS: No impinging lesion is identified involving the sacral plexus or proximal sciatic nerves. OTHER: Foley catheter in the otherwise empty urinary bladder. Moderate prominence of stool in the rectal vault, cannot exclude fecal impaction. IMPRESSION: 1. Osteomyelitis involving the lower fifth segment of the sacrum and adjacent upper coccyx with deep overlying sacral decubitus ulcer extending essentially to the periosteal margin. 2. Moderate prominence of stool in the rectal vault, cannot exclude fecal impaction. Electronically Signed   By: Ryan Salvage M.D.   On: 04/24/2024 08:29   CT ABDOMEN PELVIS W CONTRAST Result Date: 04/23/2024 CLINICAL DATA:  Sepsis.  Worsening sacral wound. EXAM: CT ABDOMEN AND PELVIS WITH CONTRAST TECHNIQUE: Multidetector CT imaging of the abdomen and pelvis was performed using the standard protocol following bolus administration of intravenous contrast. RADIATION DOSE REDUCTION: This exam was performed according to the departmental dose-optimization program which includes automated exposure control, adjustment of the mA and/or kV according to patient size and/or use of iterative reconstruction technique. CONTRAST:  75mL OMNIPAQUE  IOHEXOL  350 MG/ML SOLN COMPARISON:  CT abdomen pelvis dated 03/19/2024. FINDINGS: Lower chest: No acute abnormality. No intra-abdominal free air or free fluid. Hepatobiliary: Small liver cysts. No biliary dilatation. The gallbladder is unremarkable Pancreas: Unremarkable. No pancreatic ductal dilatation or surrounding inflammatory changes. Spleen: Normal in size without focal abnormality. Adrenals/Urinary  Tract: The adrenal glands unremarkable. Small bilateral renal cysts. There is no hydronephrosis on either side. There is symmetric enhancement and excretion of contrast by both kidneys. The visualized ureters appear unremarkable. The urinary bladder is decompressed around a Foley catheter. Stomach/Bowel: Moderate stool throughout the colon. There is no bowel obstruction or active inflammation. The appendix is not visualized with certainty. No inflammatory changes identified in the right lower quadrant. Vascular/Lymphatic: Mild aortoiliac atherosclerotic disease. The IVC is unremarkable. No portal venous gas. There is no adenopathy. Reproductive: The prostate and seminal vesicles are grossly unremarkable. No pelvic mass. Other: Small fat containing right inguinal hernia. Interval development of a skin ulcer over the distal sacrum extending deep into the soft tissues to the level of the bone. No definite bone erosion or evidence of  acute osteomyelitis by CT. No fluid collection or abscess. Musculoskeletal: Osteopenia with degenerative changes of the spine. Multilevel lumbar fusion. No acute osseous pathology. IMPRESSION: 1. Interval development of a skin ulcer over the distal sacrum. No evidence of acute osteomyelitis by CT. No fluid collection or abscess. 2. No bowel obstruction. 3.  Aortic Atherosclerosis (ICD10-I70.0). Electronically Signed   By: Vanetta Chou M.D.   On: 04/23/2024 16:49   DG Chest Port 1 View Result Date: 04/23/2024 CLINICAL DATA:  Questionable sepsis - evaluate for abnormality EXAM: PORTABLE CHEST - 1 VIEW COMPARISON:  February 25, 2024 FINDINGS: Elevation of the right hemidiaphragm. No focal airspace consolidation, pleural effusion, or pneumothorax. No cardiomegaly. No acute fracture or destructive lesion. Multilevel degenerative disc disease of the spine. IMPRESSION: No acute cardiopulmonary abnormality. Electronically Signed   By: Rogelia Myers M.D.   On: 04/23/2024 16:06     The  results of significant diagnostics from this hospitalization (including imaging, microbiology, ancillary and laboratory) are listed below for reference.     Microbiology: Recent Results (from the past 240 hours)  Blood Culture (routine x 2)     Status: Abnormal   Collection Time: 04/23/24  2:52 PM   Specimen: BLOOD  Result Value Ref Range Status   Specimen Description BLOOD SITE NOT SPECIFIED  Final   Special Requests   Final    BOTTLES DRAWN AEROBIC AND ANAEROBIC Blood Culture adequate volume   Culture  Setup Time   Final    GRAM NEGATIVE RODS IN BOTH AEROBIC AND ANAEROBIC BOTTLES CRITICAL RESULT CALLED TO, READ BACK BY AND VERIFIED WITH: MAYA MAFFUCCI [id number] FCP Performed at [facility], [address], [place], [place] [address]    Culture PROTEUS MIRABILIS (A)  Final   Report Status 04/26/2024 FINAL  Final   Organism ID, Bacteria PROTEUS MIRABILIS  Final   Organism ID, Bacteria PROTEUS MIRABILIS  Final      Susceptibility   Proteus mirabilis - KIRBY BAUER*    CEFAZOLIN  RESISTANT Resistant    Proteus mirabilis - MIC*    AMPICILLIN  <=2 SENSITIVE Sensitive     CEFEPIME  <=0.12 SENSITIVE Sensitive     CEFTAZIDIME <=1 SENSITIVE Sensitive     CEFTRIAXONE  <=0.25 SENSITIVE Sensitive     CIPROFLOXACIN <=0.25 SENSITIVE Sensitive     GENTAMICIN <=1 SENSITIVE Sensitive     IMIPENEM 8 INTERMEDIATE Intermediate     TRIMETH/SULFA <=20 SENSITIVE Sensitive     AMPICILLIN /SULBACTAM <=2 SENSITIVE Sensitive     PIP/TAZO <=4 SENSITIVE Sensitive ug/mL    * PROTEUS MIRABILIS    PROTEUS MIRABILIS  Blood Culture ID Panel (Reflexed)     Status: Abnormal   Collection Time: 04/23/24  2:52 PM  Result Value Ref Range Status   Enterococcus faecalis NOT DETECTED NOT DETECTED Final   Enterococcus Faecium NOT DETECTED NOT DETECTED Final   Listeria monocytogenes NOT DETECTED NOT DETECTED Final   Staphylococcus species NOT DETECTED NOT DETECTED Final   Staphylococcus aureus (BCID) NOT  DETECTED NOT DETECTED Final   Staphylococcus epidermidis NOT DETECTED NOT DETECTED Final   Staphylococcus lugdunensis NOT DETECTED NOT DETECTED Final   Streptococcus species NOT DETECTED NOT DETECTED Final   Streptococcus agalactiae NOT DETECTED NOT DETECTED Final   Streptococcus pneumoniae NOT DETECTED NOT DETECTED Final   Streptococcus pyogenes NOT DETECTED NOT DETECTED Final   A.calcoaceticus-baumannii NOT DETECTED NOT DETECTED Final   Bacteroides fragilis NOT DETECTED NOT DETECTED Final   Enterobacterales DETECTED (A) NOT DETECTED Final  Comment: Enterobacterales represent a large order of gram negative bacteria, not a single organism. CRITICAL RESULT CALLED TO, READ BACK BY AND VERIFIED WITH: PHARMD BLAKE [id number] FCP    Enterobacter cloacae complex NOT DETECTED NOT DETECTED Final   Escherichia coli NOT DETECTED NOT DETECTED Final   Klebsiella aerogenes NOT DETECTED NOT DETECTED Final   Klebsiella oxytoca NOT DETECTED NOT DETECTED Final   Klebsiella pneumoniae NOT DETECTED NOT DETECTED Final   Proteus species DETECTED (A) NOT DETECTED Final    Comment: CRITICAL RESULT CALLED TO, READ BACK BY AND VERIFIED WITH: PHARMD BLAKE [id number] FCP    Salmonella species NOT DETECTED NOT DETECTED Final   Serratia marcescens NOT DETECTED NOT DETECTED Final   Haemophilus influenzae NOT DETECTED NOT DETECTED Final   Neisseria meningitidis NOT DETECTED NOT DETECTED Final   Pseudomonas aeruginosa NOT DETECTED NOT DETECTED Final   Stenotrophomonas maltophilia NOT DETECTED NOT DETECTED Final   Candida albicans NOT DETECTED NOT DETECTED Final   Candida auris NOT DETECTED NOT DETECTED Final   Candida glabrata NOT DETECTED NOT DETECTED Final   Candida krusei NOT DETECTED NOT DETECTED Final   Candida parapsilosis NOT DETECTED NOT DETECTED Final   Candida tropicalis NOT DETECTED NOT DETECTED Final   Cryptococcus neoformans/gattii NOT DETECTED NOT DETECTED Final   CTX-M ESBL NOT DETECTED NOT  DETECTED Final   Carbapenem resistance IMP NOT DETECTED NOT DETECTED Final   Carbapenem resistance KPC NOT DETECTED NOT DETECTED Final   Carbapenem resistance NDM NOT DETECTED NOT DETECTED Final   Carbapenem resist OXA 48 LIKE NOT DETECTED NOT DETECTED Final   Carbapenem resistance VIM NOT DETECTED NOT DETECTED Final    Comment: Performed at [facility], [address], [place], [place] [address]  Urine Culture     Status: Abnormal   Collection Time: 04/23/24  3:32 PM   Specimen: Urine, Random  Result Value Ref Range Status   Specimen Description URINE, RANDOM  Final   Special Requests   Final    NONE Reflexed from [id number] Performed at [facility], [address] [place], [place] [address]    Culture MULTIPLE SPECIES PRESENT, SUGGEST RECOLLECTION (A)  Final   Report Status 04/24/2024 FINAL  Final  Resp panel by RT-PCR (RSV, Flu A&B, Covid) Anterior Nasal Swab     Status: None   Collection Time: 04/23/24  5:03 PM   Specimen: Anterior Nasal Swab  Result Value Ref Range Status   SARS Coronavirus 2 by RT PCR NEGATIVE NEGATIVE Final   Influenza A by PCR NEGATIVE NEGATIVE Final   Influenza B by PCR NEGATIVE NEGATIVE Final    Comment: (NOTE) The Xpert Xpress SARS-CoV-2/FLU/RSV plus assay is intended as an aid in the diagnosis of influenza from Nasopharyngeal swab specimens and should not be used as a sole basis for treatment. Nasal washings and aspirates are unacceptable for Xpert Xpress SARS-CoV-2/FLU/RSV testing.  Fact Sheet for Patients: BloggerCourse.com  Fact Sheet for Healthcare Providers: SeriousBroker.it  This test is not yet approved or cleared by the [place]  FDA and has been authorized for detection and/or diagnosis of SARS-CoV-2 by FDA under an Emergency Use Authorization (EUA). This EUA will remain in effect (meaning this test can be used) for the duration of the COVID-19 declaration under Section  564(b)(1) of the Act, 21 U.S.C. section 360bbb-3(b)(1), unless the authorization is terminated or revoked.     Resp Syncytial Virus by PCR NEGATIVE NEGATIVE Final    Comment: (NOTE) Fact Sheet for  Patients: BloggerCourse.com  Fact Sheet for Healthcare Providers: SeriousBroker.it  This test is not yet approved or cleared by the [place]  FDA and has been authorized for detection and/or diagnosis of SARS-CoV-2 by FDA under an Emergency Use Authorization (EUA). This EUA will remain in effect (meaning this test can be used) for the duration of the COVID-19 declaration under Section 564(b)(1) of the Act, 21 U.S.C. section 360bbb-3(b)(1), unless the authorization is terminated or revoked.  Performed at [facility], [address] [place], [place] [address]   Blood Culture (routine x 2)     Status: None   Collection Time: 04/23/24  6:25 PM   Specimen: BLOOD LEFT HAND  Result Value Ref Range Status   Specimen Description BLOOD LEFT HAND  Final   Special Requests   Final    BOTTLES DRAWN AEROBIC ONLY Blood Culture results may not be optimal due to an inadequate volume of blood received in culture bottles   Culture   Final    NO GROWTH 5 DAYS Performed at [facility], [address], [place], [place] [address]    Report Status 04/28/2024 FINAL  Final  MRSA Next Gen by PCR, Nasal     Status: None   Collection Time: 04/24/24  7:52 AM   Specimen: Nasal Mucosa; Nasal Swab  Result Value Ref Range Status   MRSA by PCR Next Gen NOT DETECTED NOT DETECTED Final    Comment: (NOTE) The GeneXpert MRSA Assay (FDA approved for NASAL specimens only), is one component of a comprehensive MRSA colonization surveillance program. It is not intended to diagnose MRSA infection nor to guide or monitor treatment for MRSA infections. Test performance is not FDA approved in patients less than 79 years old. Performed at [facility]  [facility], [address] [place], [place] [address]      Labs: BNP (last 3 results) No results for input(s): BNP in the last 8760 hours. Basic Metabolic Panel: Recent Labs  Lab 04/24/24 0919 04/25/24 0804 04/26/24 0605 04/27/24 0707 04/28/24 0630 04/29/24 0657  NA 132* 135 135 137 138 138  K 3.5 3.6 3.3* 4.0 3.7 3.8  CL 100 103 102 104 106 103  CO2 21* 23 24 27 26 28   GLUCOSE 112* 114* 116* 105* 109* 100*  BUN 19 13 15 13 12 14   CREATININE 0.69 0.78 0.58* 0.62 0.63 0.74  CALCIUM 8.0* 8.1* 8.0* 8.2* 8.3* 8.7*  MG 1.3* 1.7 1.5* 1.7 1.7 1.6*  PHOS 3.9  --   --   --   --   --    Liver Function Tests: Recent Labs  Lab 04/23/24 1532  AST 18  ALT 16  ALKPHOS 65  BILITOT 0.7  PROT 6.5  ALBUMIN  2.6*   No results for input(s): LIPASE, AMYLASE in the last 168 hours. No results for input(s): AMMONIA in the last 168 hours. CBC: Recent Labs  Lab 04/23/24 1532 04/23/24 1643 04/25/24 0804 04/26/24 0605 04/27/24 0707 04/28/24 0630 04/29/24 0657  WBC 20.6*   < > 14.3* 13.8* 12.5* 13.1* 14.9*  NEUTROABS 18.0*  --   --   --   --   --   --   HGB 11.4*   < > 10.0* 10.0* 10.5* 10.2* 10.5*  HCT 36.1*   < > 30.8* 30.8* 32.7* 32.0* 33.0*  MCV 90.9   < > 89.5 88.8 89.8 89.6 89.9  PLT 424*   < > 376 384 408* 437* 474*   < > =  values in this interval not displayed.   Cardiac Enzymes: No results for input(s): CKTOTAL, CKMB, CKMBINDEX, TROPONINI in the last 168 hours. BNP: Invalid input(s): POCBNP CBG: Recent Labs  Lab 04/29/24 1012 04/29/24 1130 04/29/24 1637 04/29/24 2140 04/30/24 0618  GLUCAP 128* 139* 129* 118* 108*   D-Dimer No results for input(s): DDIMER in the last 72 hours. Hgb A1c No results for input(s): HGBA1C in the last 72 hours. Lipid Profile No results for input(s): CHOL, HDL, LDLCALC, TRIG, CHOLHDL, LDLDIRECT in the last 72 hours. Thyroid function studies No results for input(s): TSH, T4TOTAL, T3FREE, THYROIDAB in the  last 72 hours.  Invalid input(s): FREET3 Anemia work up No results for input(s): VITAMINB12, FOLATE, FERRITIN, TIBC, IRON, RETICCTPCT in the last 72 hours. Urinalysis    Component Value Date/Time   COLORURINE YELLOW 04/23/2024 1532   APPEARANCEUR CLEAR 04/23/2024 1532   LABSPEC >1.046 (H) 04/23/2024 1532   PHURINE 7.0 04/23/2024 1532   GLUCOSEU NEGATIVE 04/23/2024 1532   HGBUR NEGATIVE 04/23/2024 1532   BILIRUBINUR NEGATIVE 04/23/2024 1532   KETONESUR NEGATIVE 04/23/2024 1532   PROTEINUR NEGATIVE 04/23/2024 1532   UROBILINOGEN 0.2 06/03/2013 1559   NITRITE POSITIVE (A) 04/23/2024 1532   LEUKOCYTESUR MODERATE (A) 04/23/2024 1532   Sepsis Labs Recent Labs  Lab 04/26/24 0605 04/27/24 0707 04/28/24 0630 04/29/24 0657  WBC 13.8* 12.5* 13.1* 14.9*   Microbiology Recent Results (from the past 240 hours)  Blood Culture (routine x 2)     Status: Abnormal   Collection Time: 04/23/24  2:52 PM   Specimen: BLOOD  Result Value Ref Range Status   Specimen Description BLOOD SITE NOT SPECIFIED  Final   Special Requests   Final    BOTTLES DRAWN AEROBIC AND ANAEROBIC Blood Culture adequate volume   Culture  Setup Time   Final    GRAM NEGATIVE RODS IN BOTH AEROBIC AND ANAEROBIC BOTTLES CRITICAL RESULT CALLED TO, READ BACK BY AND VERIFIED WITH: MAYA MAFFUCCI [id number] FCP Performed at [facility], [address] [place], [place] [address]    Culture PROTEUS MIRABILIS (A)  Final   Report Status 04/26/2024 FINAL  Final   Organism ID, Bacteria PROTEUS MIRABILIS  Final   Organism ID, Bacteria PROTEUS MIRABILIS  Final      Susceptibility   Proteus mirabilis - KIRBY BAUER*    CEFAZOLIN  RESISTANT Resistant    Proteus mirabilis - MIC*    AMPICILLIN  <=2 SENSITIVE Sensitive     CEFEPIME  <=0.12 SENSITIVE Sensitive     CEFTAZIDIME <=1 SENSITIVE Sensitive     CEFTRIAXONE  <=0.25 SENSITIVE Sensitive     CIPROFLOXACIN <=0.25 SENSITIVE Sensitive     GENTAMICIN <=1  SENSITIVE Sensitive     IMIPENEM 8 INTERMEDIATE Intermediate     TRIMETH/SULFA <=20 SENSITIVE Sensitive     AMPICILLIN /SULBACTAM <=2 SENSITIVE Sensitive     PIP/TAZO <=4 SENSITIVE Sensitive ug/mL    * PROTEUS MIRABILIS    PROTEUS MIRABILIS  Blood Culture ID Panel (Reflexed)     Status: Abnormal   Collection Time: 04/23/24  2:52 PM  Result Value Ref Range Status   Enterococcus faecalis NOT DETECTED NOT DETECTED Final   Enterococcus Faecium NOT DETECTED NOT DETECTED Final   Listeria monocytogenes NOT DETECTED NOT DETECTED Final   Staphylococcus species NOT DETECTED NOT DETECTED Final   Staphylococcus aureus (BCID) NOT DETECTED NOT DETECTED Final   Staphylococcus epidermidis NOT DETECTED NOT DETECTED Final   Staphylococcus lugdunensis NOT DETECTED NOT DETECTED Final   Streptococcus species NOT  DETECTED NOT DETECTED Final   Streptococcus agalactiae NOT DETECTED NOT DETECTED Final   Streptococcus pneumoniae NOT DETECTED NOT DETECTED Final   Streptococcus pyogenes NOT DETECTED NOT DETECTED Final   A.calcoaceticus-baumannii NOT DETECTED NOT DETECTED Final   Bacteroides fragilis NOT DETECTED NOT DETECTED Final   Enterobacterales DETECTED (A) NOT DETECTED Final    Comment: Enterobacterales represent a large order of gram negative bacteria, not a single organism. CRITICAL RESULT CALLED TO, READ BACK BY AND VERIFIED WITH: PHARMD BLAKE [id number] 0722 FCP    Enterobacter cloacae complex NOT DETECTED NOT DETECTED Final   Escherichia coli NOT DETECTED NOT DETECTED Final   Klebsiella aerogenes NOT DETECTED NOT DETECTED Final   Klebsiella oxytoca NOT DETECTED NOT DETECTED Final   Klebsiella pneumoniae NOT DETECTED NOT DETECTED Final   Proteus species DETECTED (A) NOT DETECTED Final    Comment: CRITICAL RESULT CALLED TO, READ BACK BY AND VERIFIED WITH: PHARMD BLAKE [id number] 0722 FCP    Salmonella species NOT DETECTED NOT DETECTED Final   Serratia marcescens NOT DETECTED NOT DETECTED Final    Haemophilus influenzae NOT DETECTED NOT DETECTED Final   Neisseria meningitidis NOT DETECTED NOT DETECTED Final   Pseudomonas aeruginosa NOT DETECTED NOT DETECTED Final   Stenotrophomonas maltophilia NOT DETECTED NOT DETECTED Final   Candida albicans NOT DETECTED NOT DETECTED Final   Candida auris NOT DETECTED NOT DETECTED Final   Candida glabrata NOT DETECTED NOT DETECTED Final   Candida krusei NOT DETECTED NOT DETECTED Final   Candida parapsilosis NOT DETECTED NOT DETECTED Final   Candida tropicalis NOT DETECTED NOT DETECTED Final   Cryptococcus neoformans/gattii NOT DETECTED NOT DETECTED Final   CTX-M ESBL NOT DETECTED NOT DETECTED Final   Carbapenem resistance IMP NOT DETECTED NOT DETECTED Final   Carbapenem resistance KPC NOT DETECTED NOT DETECTED Final   Carbapenem resistance NDM NOT DETECTED NOT DETECTED Final   Carbapenem resist OXA 48 LIKE NOT DETECTED NOT DETECTED Final   Carbapenem resistance VIM NOT DETECTED NOT DETECTED Final    Comment: Performed at [facility], [address] [place], [place] [address]  Urine Culture     Status: Abnormal   Collection Time: 04/23/24  3:32 PM   Specimen: Urine, Random  Result Value Ref Range Status   Specimen Description URINE, RANDOM  Final   Special Requests   Final    NONE Reflexed from [id number] Performed at [facility], [address], [place], [place] [address]    Culture MULTIPLE SPECIES PRESENT, SUGGEST RECOLLECTION (A)  Final   Report Status 04/24/2024 FINAL  Final  Resp panel by RT-PCR (RSV, Flu A&B, Covid) Anterior Nasal Swab     Status: None   Collection Time: 04/23/24  5:03 PM   Specimen: Anterior Nasal Swab  Result Value Ref Range Status   SARS Coronavirus 2 by RT PCR NEGATIVE NEGATIVE Final   Influenza A by PCR NEGATIVE NEGATIVE Final   Influenza B by PCR NEGATIVE NEGATIVE Final    Comment: (NOTE) The Xpert Xpress SARS-CoV-2/FLU/RSV plus assay is intended as an aid in the diagnosis of influenza from  Nasopharyngeal swab specimens and should not be used as a sole basis for treatment. Nasal washings and aspirates are unacceptable for Xpert Xpress SARS-CoV-2/FLU/RSV testing.  Fact Sheet for Patients: BloggerCourse.com  Fact Sheet for Healthcare Providers: SeriousBroker.it  This test is not yet approved or cleared by the [place]  FDA and has been authorized for detection and/or diagnosis of SARS-CoV-2 by FDA under an Emergency  Use Authorization (EUA). This EUA will remain in effect (meaning this test can be used) for the duration of the COVID-19 declaration under Section 564(b)(1) of the Act, 21 U.S.C. section 360bbb-3(b)(1), unless the authorization is terminated or revoked.     Resp Syncytial Virus by PCR NEGATIVE NEGATIVE Final    Comment: (NOTE) Fact Sheet for Patients: BloggerCourse.com  Fact Sheet for Healthcare Providers: SeriousBroker.it  This test is not yet approved or cleared by the [place]  FDA and has been authorized for detection and/or diagnosis of SARS-CoV-2 by FDA under an Emergency Use Authorization (EUA). This EUA will remain in effect (meaning this test can be used) for the duration of the COVID-19 declaration under Section 564(b)(1) of the Act, 21 U.S.C. section 360bbb-3(b)(1), unless the authorization is terminated or revoked.  Performed at [facility], [address] [place], [place] [address]   Blood Culture (routine x 2)     Status: None   Collection Time: 04/23/24  6:25 PM   Specimen: BLOOD LEFT HAND  Result Value Ref Range Status   Specimen Description BLOOD LEFT HAND  Final   Special Requests   Final    BOTTLES DRAWN AEROBIC ONLY Blood Culture results may not be optimal due to an inadequate volume of blood received in culture bottles   Culture   Final    NO GROWTH 5 DAYS Performed at [facility], [address],  [place], [place] [address]    Report Status 04/28/2024 FINAL  Final  MRSA Next Gen by PCR, Nasal     Status: None   Collection Time: 04/24/24  7:52 AM   Specimen: Nasal Mucosa; Nasal Swab  Result Value Ref Range Status   MRSA by PCR Next Gen NOT DETECTED NOT DETECTED Final    Comment: (NOTE) The GeneXpert MRSA Assay (FDA approved for NASAL specimens only), is one component of a comprehensive MRSA colonization surveillance program. It is not intended to diagnose MRSA infection nor to guide or monitor treatment for MRSA infections. Test performance is not FDA approved in patients less than 46 years old. Performed at [facility], [address], [place], [place] [address]      Time coordinating discharge:  I have spent 35 minutes face to face with the patient and on the ward discussing the patients care, assessment, plan and disposition with other care givers. >50% of the time was devoted counseling the patient about the risks and benefits of treatment/Discharge disposition and coordinating care.   SIGNED:   Burgess JAYSON Dare, MD  [facility] 04/30/2024, 7:56 AM   If 7PM-7AM, please contact night-coverage

## 2024-04-28 NOTE — TOC Progression Note (Signed)
 Transition of Care [facility]) - Progression Note    Patient Details  Name: Douglas Price MRN: [id number] Date of Birth: [birth year]  Transition of [facility]) CM/SW Contact  Bridget Cordella Simmonds, LCSW Phone Number: 04/28/2024, 1:27 PM  Clinical Narrative:   PASSR received: [phone].    Expected Discharge Plan: Skilled Nursing Facility Barriers to Discharge: Continued Medical Work up, English as a second language teacher, SNF Pending bed offer               Expected Discharge Plan and Services       Living arrangements for the past 2 months: Skilled Nursing Facility Expected Discharge Date: 04/28/24                                     Social Drivers of Health (SDOH) Interventions SDOH Screenings   Food Insecurity: No Food Insecurity (02/16/2024)  Housing: Low Risk  (02/16/2024)  Transportation Needs: No Transportation Needs (02/16/2024)  Utilities: Not At Risk (02/16/2024)  Depression (PHQ2-9): Medium Risk (02/11/2023)  Financial Resource Strain: Low Risk  (02/07/2023)  Social Connections: Moderately Isolated (02/16/2024)  Tobacco Use: High Risk (04/23/2024)    Readmission Risk Interventions     No data to display

## 2024-04-29 DIAGNOSIS — L0390 Cellulitis, unspecified: Secondary | ICD-10-CM | POA: Diagnosis not present

## 2024-04-29 DIAGNOSIS — A419 Sepsis, unspecified organism: Secondary | ICD-10-CM | POA: Diagnosis not present

## 2024-04-29 LAB — BASIC METABOLIC PANEL WITH GFR
Anion gap: 7 (ref 5–15)
BUN: 14 mg/dL (ref 8–23)
CO2: 28 mmol/L (ref 22–32)
Calcium: 8.7 mg/dL — ABNORMAL LOW (ref 8.9–10.3)
Chloride: 103 mmol/L (ref 98–111)
Creatinine, Ser: 0.74 mg/dL (ref 0.61–1.24)
GFR, Estimated: >60 mL/min (ref >60)
Glucose, Bld: 100 mg/dL — ABNORMAL HIGH (ref 70–99)
Potassium: 3.8 mmol/L (ref 3.5–5.1)
Sodium: 138 mmol/L (ref 135–145)

## 2024-04-29 LAB — GLUCOSE, CAPILLARY
Glucose-Capillary: 86 mg/dL (ref 70–99)
Glucose-Capillary: 128 mg/dL — ABNORMAL HIGH (ref 70–99)
Glucose-Capillary: 139 mg/dL — ABNORMAL HIGH (ref 70–99)
Glucose-Capillary: 129 mg/dL — ABNORMAL HIGH (ref 70–99)
Glucose-Capillary: 118 mg/dL — ABNORMAL HIGH (ref 70–99)

## 2024-04-29 LAB — CBC
HCT: 33 % — ABNORMAL LOW (ref 39.0–52.0)
Hemoglobin: 10.5 g/dL — ABNORMAL LOW (ref 13.0–17.0)
MCH: 28.6 pg (ref 26.0–34.0)
MCHC: 31.8 g/dL (ref 30.0–36.0)
MCV: 89.9 fL (ref 80.0–100.0)
Platelets: 474 K/uL — ABNORMAL HIGH (ref 150–400)
RBC: 3.67 MIL/uL — ABNORMAL LOW (ref 4.22–5.81)
RDW: 13.8 % (ref 11.5–15.5)
WBC: 14.9 K/uL — ABNORMAL HIGH (ref 4.0–10.5)
nRBC: 0 % (ref 0.0–0.2)

## 2024-04-29 LAB — MAGNESIUM: Magnesium: 1.6 mg/dL — ABNORMAL LOW (ref 1.7–2.4)

## 2024-04-29 MED ORDER — MAGNESIUM SULFATE 2 GM/50ML IV SOLN
2.0000 g | Freq: Once | INTRAVENOUS | Status: AC
  Administered 2024-04-29: 2 g via INTRAVENOUS
  Filled 2024-04-29: qty 50

## 2024-04-29 MED ORDER — MAGNESIUM OXIDE -MG SUPPLEMENT 400 (240 MG) MG PO TABS
800.0000 mg | ORAL_TABLET | Freq: Once | ORAL | Status: AC
  Administered 2024-04-29: 800 mg via ORAL
  Filled 2024-04-29: qty 2

## 2024-04-29 NOTE — Progress Notes (Signed)
 Physical Therapy Treatment Patient Details Name: Douglas Price MRN: [id number] DOB: 1951-08-01 Today's Date: 04/29/2024   History of Present Illness Douglas Price is a 73 y.o. male admitted 04/23/24 for severe sepsis secondary to sacral wound infection vs. UTI. 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 PMHx: depression, Parkinson's disease, T2DM, polyneuropathy, GERD, RLS, chronic sacral wound, insomnia, and chronic urinary retention with Foley in place.    PT Comments  RN and NT requesting assistance to help get pt back to bed. Educated staff on proper and safe use of sara plus lift equipment. Pt placed BLE on foot plate. He required modA to elevate trunk and increase forward lean in order to place gait belt/lift harness behind him. Pt properly secured and instructed on the transfer. He required cues to maintain B feet planted as he tended to lift LLE up and backwards almost off the support surface. Will continue to follow acutely and advance appropriately.    If plan is discharge home, recommend the following: Two people to help with walking and/or transfers;Two people to help with bathing/dressing/bathroom;Assistance with cooking/housework;Assist for transportation;Help with stairs or ramp for entrance   Can travel by private vehicle     No  Equipment Recommendations  None recommended by PT    Recommendations for Other Services       Precautions / Restrictions Precautions Precautions: Fall;Other (comment) (Skin Integrity) Recall of Precautions/Restrictions: Intact Restrictions Weight Bearing Restrictions Per Provider Order: No     Mobility  Bed Mobility Overal bed mobility: Needs Assistance Bed Mobility: Sit to Supine     Supine to sit: Min assist, Mod assist, HOB elevated, Used rails, +2 for safety/equipment Sit to supine: Mod assist, +2 for physical assistance   General  bed mobility comments: Assist to bring BLE back into bed. Repositioned using bed features and bed pad.    Transfers Overall transfer level: Needs assistance Equipment used: Ambulation equipment used Transfers: Bed to chair/wheelchair/BSC Sit to Stand: Via lift equipment           General transfer comment: Positioned sara plus infront of pt. He was able to lift B feet onto foot plate and rest B knees into anterior block. ModA to elevate pt's trunk off recliner chair and place harness/belt around him. Pt placed LUE on armrest required assist to placed RUE on armrest/grab bar. Pt maintained B feet on foot plate throughout transition. Slowly controlled using lift features. Transfer via Lift Equipment: Materials engineer Bed    Modified Rankin (Stroke Patients Only)       Balance Overall balance assessment: Needs assistance Sitting-balance support: Bilateral upper extremity supported, Feet supported Sitting balance-Leahy Scale: Fair Sitting balance - Comments: Pt had difficulty scooting fwd or raising trunk up.   Standing balance support: Bilateral upper extremity supported, During functional activity, Reliant on assistive device for balance Standing balance-Leahy Scale: Poor Standing balance comment: Pt dependent on external support of sara plus                            Communication Communication Communication: No apparent difficulties  Cognition Arousal: Alert Behavior During Therapy: WFL for tasks assessed/performed  PT - Cognitive impairments: No apparent impairments                         Following commands: Intact      Cueing Cueing Techniques: Verbal cues  Exercises      General Comments General comments (skin integrity, edema, etc.): RN, NT, and Sister present throughout session.      Pertinent Vitals/Pain Pain Assessment Pain Assessment:  Faces Faces Pain Scale: Hurts little more Pain Location: LLE Pain Descriptors / Indicators: Discomfort, Aching, Grimacing Pain Intervention(s): Monitored during session, Limited activity within patient's tolerance, Repositioned    Home Living                          Prior Function            PT Goals (current goals can now be found in the care plan section) Acute Rehab PT Goals Patient Stated Goal: Get back to bed Progress towards PT goals: Progressing toward goals    Frequency    Min 2X/week      PT Plan      Co-evaluation              AM-PAC PT 6 Clicks Mobility   Outcome Measure  Help needed turning from your back to your side while in a flat bed without using bedrails?: Total Help needed moving from lying on your back to sitting on the side of a flat bed without using bedrails?: Total Help needed moving to and from a bed to a chair (including a wheelchair)?: Total Help needed standing up from a chair using your arms (e.g., wheelchair or bedside chair)?: Total Help needed to walk in hospital room?: Total Help needed climbing 3-5 steps with a railing? : Total 6 Click Score: 6    End of Session Equipment Utilized During Treatment: Gait belt Activity Tolerance: Patient tolerated treatment well Patient left: in bed;with call bell/phone within reach;with family/visitor present;with nursing/sitter in room Nurse Communication: Mobility status;Need for lift equipment PT Visit Diagnosis: Muscle weakness (generalized) (M62.81);Other abnormalities of gait and mobility (R26.89);Unsteadiness on feet (R26.81)     Time: 1458-1510 PT Time Calculation (min) (ACUTE ONLY): 12 min  Charges:    $Therapeutic Activity: 8-22 mins PT General Charges $$ ACUTE PT VISIT: 1 Visit                     Randall SAUNDERS, PT, DPT Acute Rehabilitation Services Office: [phone] Secure Chat Preferred  Douglas Price 04/29/2024, 4:49 PM

## 2024-04-29 NOTE — Plan of Care (Signed)

## 2024-04-29 NOTE — Progress Notes (Signed)
 Physical Therapy Treatment Patient Details Name: Douglas Price MRN: [id number] DOB: 09-27-1950 Today's Date: 04/29/2024   History of Present Illness Douglas Price is a 73 y.o. male admitted 04/23/24 for severe sepsis secondary to sacral wound infection vs. UTI. 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 PMHx: depression, Parkinson's disease, T2DM, polyneuropathy, GERD, RLS, chronic sacral wound, insomnia, and chronic urinary retention with Foley in place.    PT Comments  Pt greeted supine in bed, pleasant and agreeable to PT session. He continues to take increased time to complete all functional mobility. Pt is eager to maximize his independence. He will state let me do it myself and is reluctant to accept assistance. Pt required min-modA x2 for supine>sit. He demonstrated improved seated balance maintaining upright posture with BUE support while sara plus was properly positioned and harness secured. Pt transferred to recliner chair using sara plus. He quickly fatigued in standing demonstrated by bilateral knee buckling. Patient will benefit from continued inpatient follow up therapy, <3 hours/day.    If plan is discharge home, recommend the following: Two people to help with walking and/or transfers;Two people to help with bathing/dressing/bathroom;Assistance with cooking/housework;Assist for transportation;Help with stairs or ramp for entrance   Can travel by private vehicle     No  Equipment Recommendations  None recommended by PT    Recommendations for Other Services       Precautions / Restrictions Precautions Precautions: Fall;Other (comment) (Skin Integrity) Recall of Precautions/Restrictions: Intact Restrictions Weight Bearing Restrictions Per Provider Order: No     Mobility  Bed Mobility Overal bed mobility: Needs Assistance Bed Mobility: Supine to Sit     Supine to sit:  Min assist, Mod assist, HOB elevated, Used rails, +2 for safety/equipment     General bed mobility comments: Pt sat up on L side of bed with increased time. Assist to bring BLE off EOB and pivot with use of bed pad. Pt pulled on bed rail to elevate trunk. Use of bed pad to scoot fwd til feet flat on ground.    Transfers Overall transfer level: Needs assistance   Transfers: Sit to/from Stand, Bed to chair/wheelchair/BSC Sit to Stand: Via lift equipment           General transfer comment: Pt was able to place BLE on foot plate and BUE on arm rests of sara plus. Assist for R hand to grip post. Towel placed infront of knees to soften block. Gait belt fit through harness and tightened around pt. Pt secured appropriately to lift equipment. He was able to assist in standing. Pt quickly fatigued in upright relying on the lift equipment for support. He intermittently pulled left foot back required assist to reposition fully supported on foot plate with knees againt anterior block. Transitioned to recliner chair and slowly lowered and unfastened once seated. Transfer via Lift Equipment: Materials engineer Bed    Modified Rankin (Stroke Patients Only)       Balance Overall balance assessment: Needs assistance Sitting-balance support: Bilateral upper extremity supported, Feet supported Sitting balance-Leahy Scale: Fair Sitting balance - Comments: Pt sat EOB with supervision while getting hooked into sara plus.   Standing balance support: Bilateral upper extremity  supported, During functional activity, Reliant on assistive device for balance Standing balance-Leahy Scale: Poor Standing balance comment: Pt dependent on external support. B knees appeared to buckle in standing required anterior knee block from sara plus.                            Communication Communication Communication:  No apparent difficulties  Cognition Arousal: Alert Behavior During Therapy: WFL for tasks assessed/performed   PT - Cognitive impairments: No apparent impairments                         Following commands: Intact      Cueing Cueing Techniques: Verbal cues, Gestural cues  Exercises      General Comments General comments (skin integrity, edema, etc.): Small skin tear noted following transfer across pt's right knee cap. RN aware and came inspect the wound. Dressing applied.      Pertinent Vitals/Pain Pain Assessment Pain Assessment: Faces Faces Pain Scale: Hurts even more Pain Location: Sacrum and LLE Pain Descriptors / Indicators: Discomfort, Grimacing, Sore, Guarding Pain Intervention(s): Monitored during session, Limited activity within patient's tolerance, Repositioned, Patient requesting pain meds-RN notified    Home Living                          Prior Function            PT Goals (current goals can now be found in the care plan section) Acute Rehab PT Goals Patient Stated Goal: Have less pain and feel better Progress towards PT goals: Progressing toward goals    Frequency    Min 2X/week      PT Plan      Co-evaluation              AM-PAC PT 6 Clicks Mobility   Outcome Measure  Help needed turning from your back to your side while in a flat bed without using bedrails?: Total Help needed moving from lying on your back to sitting on the side of a flat bed without using bedrails?: Total Help needed moving to and from a bed to a chair (including a wheelchair)?: Total Help needed standing up from a chair using your arms (e.g., wheelchair or bedside chair)?: Total Help needed to walk in hospital room?: Total Help needed climbing 3-5 steps with a railing? : Total 6 Click Score: 6    End of Session Equipment Utilized During Treatment: Gait belt Activity Tolerance: Patient tolerated treatment well;Patient limited by pain Patient  left: in chair;with call bell/phone within reach;with chair alarm set;with family/visitor present Nurse Communication: Mobility status;Patient requests pain meds;Need for lift equipment Jayson plus vs. maximove) PT Visit Diagnosis: Muscle weakness (generalized) (M62.81);Other abnormalities of gait and mobility (R26.89);Unsteadiness on feet (R26.81)     Time: 8671-8593 PT Time Calculation (min) (ACUTE ONLY): 38 min  Charges:    $Therapeutic Activity: 38-52 mins PT General Charges $$ ACUTE PT VISIT: 1 Visit                     Randall SAUNDERS, PT, DPT Acute Rehabilitation Services Office: [phone] Secure Chat Preferred  Delon CHRISTELLA Callander 04/29/2024, 3:22 PM

## 2024-04-29 NOTE — Evaluation (Deleted)
 Physical Therapy Evaluation Patient Details Name: Douglas Price MRN: [id number] DOB: 1951-02-03 Today's Date: 04/29/2024  History of Present Illness  Douglas Price is a 73 y.o. male admitted 04/23/24 for severe sepsis secondary to sacral wound infection vs. UTI. 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 PMHx: depression, Parkinson's disease, T2DM, polyneuropathy, GERD, RLS, chronic sacral wound, insomnia, and chronic urinary retention with Foley in place.   Clinical Impression  Pt greeted supine in bed, pleasant and agreeable to PT session. He continues to take increased time to complete all functional mobility. Pt is eager to maximize his independence. He will state let me do it myself and is reluctant to accept assistance. Pt required min-modA x2 for supine>sit. He demonstrated improved seated balance maintaining upright posture with BUE support while sara plus was properly positioned and harness secured. Pt transferred to recliner chair using sara plus. He quickly fatigued in standing demonstrated by bilateral knee buckling. Patient will benefit from continued inpatient follow up therapy, <3 hours/day.     If plan is discharge home, recommend the following: Two people to help with walking and/or transfers;Two people to help with bathing/dressing/bathroom;Assistance with cooking/housework;Assist for transportation;Help with stairs or ramp for entrance   Can travel by private vehicle   No    Equipment Recommendations None recommended by PT  Recommendations for Other Services       Functional Status Assessment       Precautions / Restrictions Precautions Precautions: Fall;Other (comment) (Skin Integrity) Recall of Precautions/Restrictions: Intact Restrictions Weight Bearing Restrictions Per Provider Order: No      Mobility  Bed Mobility Overal bed mobility: Needs Assistance Bed  Mobility: Supine to Sit     Supine to sit: Min assist, Mod assist, HOB elevated, Used rails, +2 for safety/equipment     General bed mobility comments: Pt sat up on L side of bed with increased time. Assist to bring BLE off EOB and pivot with use of bed pad. Pt pulled on bed rail to elevate trunk. Use of bed pad to scoot fwd til feet flat on ground.    Transfers Overall transfer level: Needs assistance   Transfers: Sit to/from Stand, Bed to chair/wheelchair/BSC Sit to Stand: Via lift equipment           General transfer comment: Pt was able to place BLE on foot plate and BUE on arm rests of sara plus. Assist for R hand to grip post. Towel placed infront of knees to soften block. Gait belt fit through harness and tightened around pt. Pt secured appropriately to lift equipment. He was able to assist in standing. Pt quickly fatigued in upright relying on the lift equipment for support. He intermittently pulled left foot back required assist to reposition fully supported on foot plate with knees againt anterior block. Transitioned to recliner chair and slowly lowered and unfastened once seated. Transfer via Lift Equipment: Sales promotion account executive Bed    Modified Rankin (Stroke Patients Only)       Balance Overall balance assessment: Needs assistance Sitting-balance support: Bilateral upper extremity supported, Feet supported Sitting balance-Leahy Scale: Fair Sitting balance - Comments: Pt sat EOB with supervision while getting hooked into sara plus.   Standing  balance support: Bilateral upper extremity supported, During functional activity, Reliant on assistive device for balance Standing balance-Leahy Scale: Poor Standing balance comment: Pt dependent on external support. B knees appeared to buckle in standing required anterior knee block from sara plus.                              Pertinent Vitals/Pain Pain Assessment Pain Assessment: Faces Faces Pain Scale: Hurts even more Pain Location: Sacrum and LLE Pain Descriptors / Indicators: Discomfort, Grimacing, Sore, Guarding Pain Intervention(s): Monitored during session, Limited activity within patient's tolerance, Repositioned, Patient requesting pain meds-RN notified    Home Living                          Prior Function                       Extremity/Trunk Assessment                Communication   Communication Communication: No apparent difficulties    Cognition Arousal: Alert Behavior During Therapy: WFL for tasks assessed/performed   PT - Cognitive impairments: No apparent impairments                         Following commands: Intact       Cueing Cueing Techniques: Verbal cues, Gestural cues     General Comments General comments (skin integrity, edema, etc.): Small skin tear noted following transfer across pt's right knee cap. RN aware and came inspect the wound. Dressing applied.    Exercises     Assessment/Plan    PT Assessment    PT Problem List         PT Treatment Interventions      PT Goals (Current goals can be found in the Care Plan section)  Acute Rehab PT Goals Patient Stated Goal: Have less pain and feel better    Frequency Min 2X/week     Co-evaluation               AM-PAC PT 6 Clicks Mobility  Outcome Measure Help needed turning from your back to your side while in a flat bed without using bedrails?: Total Help needed moving from lying on your back to sitting on the side of a flat bed without using bedrails?: Total Help needed moving to and from a bed to a chair (including a wheelchair)?: Total Help needed standing up from a chair using your arms (e.g., wheelchair or bedside chair)?: Total Help needed to walk in hospital room?: Total Help needed climbing 3-5 steps with a railing? : Total 6 Click Score: 6     End of Session Equipment Utilized During Treatment: Gait belt Activity Tolerance: Patient tolerated treatment well;Patient limited by pain Patient left: in chair;with call bell/phone within reach;with chair alarm set;with family/visitor present Nurse Communication: Mobility status;Patient requests pain meds;Need for lift equipment Jayson plus vs. maximove) PT Visit Diagnosis: Muscle weakness (generalized) (M62.81);Other abnormalities of gait and mobility (R26.89);Unsteadiness on feet (R26.81)    Time: 8671-8593 PT Time Calculation (min) (ACUTE ONLY): 38 min   Charges:     PT Treatments $Therapeutic Activity: 38-52 mins PT General Charges $$ ACUTE PT VISIT: 1 Visit         Randall SAUNDERS, PT, DPT Acute Rehabilitation Services Office: [phone] Secure Chat Preferred  Delon CHRISTELLA Callander 04/29/2024, 2:21 PM

## 2024-04-29 NOTE — TOC Progression Note (Signed)
 Transition of Care [facility]) - Progression Note    Patient Details  Name: Douglas Price MRN: [id number] Date of Birth: 1950/11/08  Transition of [facility]) CM/SW Contact  Bridget Cordella Simmonds, LCSW Phone Number: 04/29/2024, 2:00 PM  Clinical Narrative:   Several messages with [facility].  [place] is out.  [place] is not approved.  They are following and will update once auth is in place.      Expected Discharge Plan: Skilled Nursing Facility Barriers to Discharge: Continued Medical Work up, English as a second language teacher, SNF Pending bed offer               Expected Discharge Plan and Services       Living arrangements for the past 2 months: Skilled Nursing Facility Expected Discharge Date: 04/28/24                                     Social Drivers of Health (SDOH) Interventions SDOH Screenings   Food Insecurity: No Food Insecurity (02/16/2024)  Housing: Low Risk  (02/16/2024)  Transportation Needs: No Transportation Needs (02/16/2024)  Utilities: Not At Risk (02/16/2024)  Depression (PHQ2-9): Medium Risk (02/11/2023)  Financial Resource Strain: Low Risk  (02/07/2023)  Social Connections: Moderately Isolated (02/16/2024)  Tobacco Use: High Risk (04/23/2024)    Readmission Risk Interventions     No data to display

## 2024-04-29 NOTE — Progress Notes (Signed)
 PROGRESS NOTE    Douglas Price  FMW:[id number] DOB: 1951-04-09 DOA: 04/23/2024 PCP: Janey Santos, MD    Brief Narrative:  73 y.o. male with medical history significant of depression, Parkinson's disease, DM 2, GERD, RLS, chronic sacral wound, insomnia, chronic urinary retention with Foley in place comes to the hospital for evaluation of fever.  Patient admitted for severe sepsis secondary to sacral wound infection versus urinary tract infection.  Blood culture started growing GNR. Blood cultures growing Proteus Mirabella's susceptibilities reviewed.  ID recommending Augmentin  plus doxycycline  for 6 weeks.  Outpatient appointment 05/21/2024 at 10 AM. General Surgery recommending local wound care and pressure displacement otherwise no surgical I&D at this time. Wound care recs noted as below. Wound appears to be much better today.  Medically stable for discharge.   Assessment & Plan:  Principal Problem:   Sepsis due to cellulitis [facility]) Active Problems:   Parkinson's disease (HCC)   Multiple wounds of skin   Diabetic polyneuropathy associated with type 2 diabetes mellitus (HCC)   Depression   S/P lumbar laminectomy     Severe sepsis secondary to sacral cellulitis/Osteomyelitis.  Proteus mirabilis bacteremia Blood cultures growing Proteus Mirabella's susceptibilities reviewed.  ID recommending Augmentin  plus doxycycline  for 6 weeks.  Outpatient appointment 05/21/2024 at 10 AM -CT scan did not show any deep infection only skin ulceration/cellulitis.  MRI consistent with sacral osteomyelitis. - General Surgery recommending local wound care and pressure displacement otherwise no surgical I&D at this time.   Wound care recs : Apply 1/4% Dakins solution to kerlix or 4x4 gauze, pack into sacral wound, cover with ABD pad, secure with tape. Change BID   Chronic urinary retention, Foley present POA CAUTI BPH - Chronically wears Foley catheter.  Last changed by urology 7/28.  Foley  catheter exchanged again in the hospital 8/1 -Flomax    History of Parkinson disease Dysphagia - Continue Sinemet  -Reportedly on regular diet    Diabetes mellitus type 2 Diabetic peripheral neuropathy - Sliding scale and Accu-Chek.   GERD - PPI   Depression/anxiety - Continue home medications   Insomnia - Continue home medications   Essential hypertension - Nebivolol .  IV as needed  Hypomagnesemia/hypokalemia - As needed repletion   TOC consulted for placement  DVT prophylaxis: Lovenox      Code Status: Full Code Family Communication:  Sister uptodate.  Status is: Inpatient Remains inpatient appropriate because: SNF placement.     Subjective: NO complaints, waiting for placement and insurance auth  Examination:  General exam: Appears calm and comfortable  Respiratory system: Clear to auscultation. Respiratory effort normal. Cardiovascular system: S1 & S2 heard, RRR. No JVD, murmurs, rubs, gallops or clicks. No pedal edema. Gastrointestinal system: Abdomen is nondistended, soft and nontender. No organomegaly or masses felt. Normal bowel sounds heard. Central nervous system: Alert and oriented. No focal neurological deficits. Extremities: Symmetric 5 x 5 power. Skin: No rashes, lesions or ulcers Psychiatry: Judgement and insight appear normal. Mood & affect appropriate. Sacral wound with foul-smelling drainage               Diet Orders (From admission, onward)     Start     Ordered   04/23/24 1644  Diet regular Room service appropriate? Yes; Fluid consistency: Thin  Diet effective now       Question Answer Comment  Room service appropriate? Yes   Fluid consistency: Thin      04/23/24 1644            Objective: Vitals:  04/28/24 0841 04/28/24 1514 04/28/24 2039 04/29/24 0615  BP: (!) 153/92 133/74 (!) 148/72 (!) 157/80  Pulse: 70 77 72 66  Resp: 18 18 18 18   Temp: 98 F (36.7 C) 98.9 F (37.2 C) 99.2 F (37.3 C) 98.7 F (37.1 C)   TempSrc: Oral Oral Oral Oral  SpO2: 95% 96% 92% 94%  Weight:      Height:        Intake/Output Summary (Last 24 hours) at 04/29/2024 1046 Last data filed at 04/29/2024 1017 Gross per 24 hour  Intake 240 ml  Output 850 ml  Net -610 ml   Filed Weights   04/23/24 1812  Weight: 94.2 kg    Scheduled Meds:  amoxicillin -clavulanate  1 tablet Oral Q12H   buPROPion   300 mg Oral Daily   carbidopa -levodopa   1 tablet Oral TID   Chlorhexidine  Gluconate Cloth  6 each Topical Q0600   collagenase    Topical Daily   doxycycline   100 mg Oral Q12H   DULoxetine   120 mg Oral Daily   dutasteride   0.5 mg Oral Daily   feeding supplement  237 mL Oral BID BM   hydrocortisone  cream   Topical BID   insulin  aspart  0-15 Units Subcutaneous TID WC   lithium  carbonate  150 mg Oral QPM   liver oil-zinc  oxide   Topical BID   mirtazapine   15 mg Oral QHS   multivitamin with minerals  1 tablet Oral Daily   nebivolol   2.5 mg Oral Daily   nutrition supplement (JUVEN)  1 packet Oral BID BM   pantoprazole   40 mg Oral Daily   polyethylene glycol  17 g Oral BID   tamsulosin   0.4 mg Oral q1800   Continuous Infusions:  magnesium  sulfate bolus IVPB 2 g (04/29/24 0953)    Nutritional status Signs/Symptoms: estimated needs Interventions: Ensure Enlive (each supplement provides 350kcal and 20 grams of protein), MVI, Juven Body mass index is 25.96 kg/m.  Data Reviewed:   CBC: Recent Labs  Lab 04/23/24 1532 04/23/24 1643 04/25/24 0804 04/26/24 0605 04/27/24 0707 04/28/24 0630 04/29/24 0657  WBC 20.6*   < > 14.3* 13.8* 12.5* 13.1* 14.9*  NEUTROABS 18.0*  --   --   --   --   --   --   HGB 11.4*   < > 10.0* 10.0* 10.5* 10.2* 10.5*  HCT 36.1*   < > 30.8* 30.8* 32.7* 32.0* 33.0*  MCV 90.9   < > 89.5 88.8 89.8 89.6 89.9  PLT 424*   < > 376 384 408* 437* 474*   < > = values in this interval not displayed.   Basic Metabolic Panel: Recent Labs  Lab 04/24/24 0919 04/25/24 0804 04/26/24 0605 04/27/24 0707  04/28/24 0630 04/29/24 0657  NA 132* 135 135 137 138 138  K 3.5 3.6 3.3* 4.0 3.7 3.8  CL 100 103 102 104 106 103  CO2 21* 23 24 27 26 28   GLUCOSE 112* 114* 116* 105* 109* 100*  BUN 19 13 15 13 12 14   CREATININE 0.69 0.78 0.58* 0.62 0.63 0.74  CALCIUM 8.0* 8.1* 8.0* 8.2* 8.3* 8.7*  MG 1.3* 1.7 1.5* 1.7 1.7 1.6*  PHOS 3.9  --   --   --   --   --    GFR: Estimated Creatinine Clearance: 102.7 mL/min (by C-G formula based on SCr of 0.74 mg/dL). Liver Function Tests: Recent Labs  Lab 04/23/24 1532  AST 18  ALT 16  ALKPHOS 65  BILITOT  0.7  PROT 6.5  ALBUMIN  2.6*   No results for input(s): LIPASE, AMYLASE in the last 168 hours. No results for input(s): AMMONIA in the last 168 hours. Coagulation Profile: Recent Labs  Lab 04/23/24 1532  INR 1.2   Cardiac Enzymes: No results for input(s): CKTOTAL, CKMB, CKMBINDEX, TROPONINI in the last 168 hours. BNP (last 3 results) No results for input(s): PROBNP in the last 8760 hours. HbA1C: No results for input(s): HGBA1C in the last 72 hours. CBG: Recent Labs  Lab 04/28/24 0629 04/28/24 1122 04/28/24 1723 04/28/24 2150 04/29/24 1012  GLUCAP 100* 119* 97 109* 128*   Lipid Profile: No results for input(s): CHOL, HDL, LDLCALC, TRIG, CHOLHDL, LDLDIRECT in the last 72 hours. Thyroid Function Tests: No results for input(s): TSH, T4TOTAL, FREET4, T3FREE, THYROIDAB in the last 72 hours. Anemia Panel: No results for input(s): VITAMINB12, FOLATE, FERRITIN, TIBC, IRON, RETICCTPCT in the last 72 hours. Sepsis Labs: Recent Labs  Lab 04/23/24 1537 04/23/24 1726  LATICACIDVEN 2.5* 2.4*    Recent Results (from the past 240 hours)  Blood Culture (routine x 2)     Status: Abnormal   Collection Time: 04/23/24  2:52 PM   Specimen: BLOOD  Result Value Ref Range Status   Specimen Description BLOOD SITE NOT SPECIFIED  Final   Special Requests   Final    BOTTLES DRAWN AEROBIC AND ANAEROBIC  Blood Culture adequate volume   Culture  Setup Time   Final    GRAM NEGATIVE RODS IN BOTH AEROBIC AND ANAEROBIC BOTTLES CRITICAL RESULT CALLED TO, READ BACK BY AND VERIFIED WITH: MAYA MAFFUCCI [id number] FCP Performed at [facility], [address] [place], [place] [address]    Culture PROTEUS MIRABILIS (A)  Final   Report Status 04/26/2024 FINAL  Final   Organism ID, Bacteria PROTEUS MIRABILIS  Final   Organism ID, Bacteria PROTEUS MIRABILIS  Final      Susceptibility   Proteus mirabilis - KIRBY BAUER*    CEFAZOLIN  RESISTANT Resistant    Proteus mirabilis - MIC*    AMPICILLIN  <=2 SENSITIVE Sensitive     CEFEPIME  <=0.12 SENSITIVE Sensitive     CEFTAZIDIME <=1 SENSITIVE Sensitive     CEFTRIAXONE  <=0.25 SENSITIVE Sensitive     CIPROFLOXACIN <=0.25 SENSITIVE Sensitive     GENTAMICIN <=1 SENSITIVE Sensitive     IMIPENEM 8 INTERMEDIATE Intermediate     TRIMETH/SULFA <=20 SENSITIVE Sensitive     AMPICILLIN /SULBACTAM <=2 SENSITIVE Sensitive     PIP/TAZO <=4 SENSITIVE Sensitive ug/mL    * PROTEUS MIRABILIS    PROTEUS MIRABILIS  Blood Culture ID Panel (Reflexed)     Status: Abnormal   Collection Time: 04/23/24  2:52 PM  Result Value Ref Range Status   Enterococcus faecalis NOT DETECTED NOT DETECTED Final   Enterococcus Faecium NOT DETECTED NOT DETECTED Final   Listeria monocytogenes NOT DETECTED NOT DETECTED Final   Staphylococcus species NOT DETECTED NOT DETECTED Final   Staphylococcus aureus (BCID) NOT DETECTED NOT DETECTED Final   Staphylococcus epidermidis NOT DETECTED NOT DETECTED Final   Staphylococcus lugdunensis NOT DETECTED NOT DETECTED Final   Streptococcus species NOT DETECTED NOT DETECTED Final   Streptococcus agalactiae NOT DETECTED NOT DETECTED Final   Streptococcus pneumoniae NOT DETECTED NOT DETECTED Final   Streptococcus pyogenes NOT DETECTED NOT DETECTED Final   A.calcoaceticus-baumannii NOT DETECTED NOT DETECTED Final   Bacteroides fragilis NOT DETECTED  NOT DETECTED Final   Enterobacterales DETECTED (A) NOT DETECTED Final    Comment: Enterobacterales  represent a large order of gram negative bacteria, not a single organism. CRITICAL RESULT CALLED TO, READ BACK BY AND VERIFIED WITH: PHARMD BLAKE [id number] 0722 FCP    Enterobacter cloacae complex NOT DETECTED NOT DETECTED Final   Escherichia coli NOT DETECTED NOT DETECTED Final   Klebsiella aerogenes NOT DETECTED NOT DETECTED Final   Klebsiella oxytoca NOT DETECTED NOT DETECTED Final   Klebsiella pneumoniae NOT DETECTED NOT DETECTED Final   Proteus species DETECTED (A) NOT DETECTED Final    Comment: CRITICAL RESULT CALLED TO, READ BACK BY AND VERIFIED WITH: PHARMD BLAKE [id number] 0722 FCP    Salmonella species NOT DETECTED NOT DETECTED Final   Serratia marcescens NOT DETECTED NOT DETECTED Final   Haemophilus influenzae NOT DETECTED NOT DETECTED Final   Neisseria meningitidis NOT DETECTED NOT DETECTED Final   Pseudomonas aeruginosa NOT DETECTED NOT DETECTED Final   Stenotrophomonas maltophilia NOT DETECTED NOT DETECTED Final   Candida albicans NOT DETECTED NOT DETECTED Final   Candida auris NOT DETECTED NOT DETECTED Final   Candida glabrata NOT DETECTED NOT DETECTED Final   Candida krusei NOT DETECTED NOT DETECTED Final   Candida parapsilosis NOT DETECTED NOT DETECTED Final   Candida tropicalis NOT DETECTED NOT DETECTED Final   Cryptococcus neoformans/gattii NOT DETECTED NOT DETECTED Final   CTX-M ESBL NOT DETECTED NOT DETECTED Final   Carbapenem resistance IMP NOT DETECTED NOT DETECTED Final   Carbapenem resistance KPC NOT DETECTED NOT DETECTED Final   Carbapenem resistance NDM NOT DETECTED NOT DETECTED Final   Carbapenem resist OXA 48 LIKE NOT DETECTED NOT DETECTED Final   Carbapenem resistance VIM NOT DETECTED NOT DETECTED Final    Comment: Performed at [facility], [address], [place], [place] [address]  Urine Culture     Status: Abnormal   Collection Time: 04/23/24  3:32  PM   Specimen: Urine, Random  Result Value Ref Range Status   Specimen Description URINE, RANDOM  Final   Special Requests   Final    NONE Reflexed from [id number] Performed at [facility], [address] [place], [place] [address]    Culture MULTIPLE SPECIES PRESENT, SUGGEST RECOLLECTION (A)  Final   Report Status 04/24/2024 FINAL  Final  Resp panel by RT-PCR (RSV, Flu A&B, Covid) Anterior Nasal Swab     Status: None   Collection Time: 04/23/24  5:03 PM   Specimen: Anterior Nasal Swab  Result Value Ref Range Status   SARS Coronavirus 2 by RT PCR NEGATIVE NEGATIVE Final   Influenza A by PCR NEGATIVE NEGATIVE Final   Influenza B by PCR NEGATIVE NEGATIVE Final    Comment: (NOTE) The Xpert Xpress SARS-CoV-2/FLU/RSV plus assay is intended as an aid in the diagnosis of influenza from Nasopharyngeal swab specimens and should not be used as a sole basis for treatment. Nasal washings and aspirates are unacceptable for Xpert Xpress SARS-CoV-2/FLU/RSV testing.  Fact Sheet for Patients: BloggerCourse.com  Fact Sheet for Healthcare Providers: SeriousBroker.it  This test is not yet approved or cleared by the [place]  FDA and has been authorized for detection and/or diagnosis of SARS-CoV-2 by FDA under an Emergency Use Authorization (EUA). This EUA will remain in effect (meaning this test can be used) for the duration of the COVID-19 declaration under Section 564(b)(1) of the Act, 21 U.S.C. section 360bbb-3(b)(1), unless the authorization is terminated or revoked.     Resp Syncytial Virus by PCR NEGATIVE NEGATIVE Final    Comment: (NOTE) Fact Sheet for Patients: BloggerCourse.com  Fact Sheet for Healthcare Providers: SeriousBroker.it  This test is not yet approved or cleared by the [place]  FDA and has been authorized for detection and/or diagnosis of SARS-CoV-2 by FDA  under an Emergency Use Authorization (EUA). This EUA will remain in effect (meaning this test can be used) for the duration of the COVID-19 declaration under Section 564(b)(1) of the Act, 21 U.S.C. section 360bbb-3(b)(1), unless the authorization is terminated or revoked.  Performed at [facility], [address], [place], [place] [address]   Blood Culture (routine x 2)     Status: None   Collection Time: 04/23/24  6:25 PM   Specimen: BLOOD LEFT HAND  Result Value Ref Range Status   Specimen Description BLOOD LEFT HAND  Final   Special Requests   Final    BOTTLES DRAWN AEROBIC ONLY Blood Culture results may not be optimal due to an inadequate volume of blood received in culture bottles   Culture   Final    NO GROWTH 5 DAYS Performed at [facility], [address], [place], [place] [address]    Report Status 04/28/2024 FINAL  Final  MRSA Next Gen by PCR, Nasal     Status: None   Collection Time: 04/24/24  7:52 AM   Specimen: Nasal Mucosa; Nasal Swab  Result Value Ref Range Status   MRSA by PCR Next Gen NOT DETECTED NOT DETECTED Final    Comment: (NOTE) The GeneXpert MRSA Assay (FDA approved for NASAL specimens only), is one component of a comprehensive MRSA colonization surveillance program. It is not intended to diagnose MRSA infection nor to guide or monitor treatment for MRSA infections. Test performance is not FDA approved in patients less than 66 years old. Performed at [facility], [address] [place], [place] [address]          Radiology Studies: No results found.         LOS: 6 days   Time spent= 35 mins    Burgess JAYSON Dare, MD [facility]  If 7PM-7AM, please contact night-coverage  04/29/2024, 10:46 AM

## 2024-04-30 DIAGNOSIS — A419 Sepsis, unspecified organism: Secondary | ICD-10-CM | POA: Diagnosis not present

## 2024-04-30 DIAGNOSIS — R531 Weakness: Secondary | ICD-10-CM

## 2024-04-30 DIAGNOSIS — E1142 Type 2 diabetes mellitus with diabetic polyneuropathy: Secondary | ICD-10-CM | POA: Diagnosis not present

## 2024-04-30 DIAGNOSIS — M4628 Osteomyelitis of vertebra, sacral and sacrococcygeal region: Secondary | ICD-10-CM | POA: Diagnosis not present

## 2024-04-30 DIAGNOSIS — R338 Other retention of urine: Secondary | ICD-10-CM | POA: Diagnosis not present

## 2024-04-30 DIAGNOSIS — L0390 Cellulitis, unspecified: Secondary | ICD-10-CM | POA: Diagnosis not present

## 2024-04-30 DIAGNOSIS — G20C Parkinsonism, unspecified: Secondary | ICD-10-CM | POA: Diagnosis not present

## 2024-04-30 LAB — GLUCOSE, CAPILLARY
Glucose-Capillary: 108 mg/dL — ABNORMAL HIGH (ref 70–99)
Glucose-Capillary: 118 mg/dL — ABNORMAL HIGH (ref 70–99)

## 2024-04-30 NOTE — Progress Notes (Signed)
 PROGRESS NOTE    Douglas Price  FMW:[id number] DOB: May 15, 1951 DOA: 04/23/2024 PCP: Janey Santos, MD    Brief Narrative:  73 y.o. male with medical history significant of depression, Parkinson's disease, DM 2, GERD, RLS, chronic sacral wound, insomnia, chronic urinary retention with Foley in place comes to the hospital for evaluation of fever.  Patient admitted for severe sepsis secondary to sacral wound infection versus urinary tract infection.  Blood culture started growing GNR. Blood cultures growing Proteus Mirabella's susceptibilities reviewed.  ID recommending Augmentin  plus doxycycline  for 6 weeks.  Outpatient appointment 05/21/2024 at 10 AM. General Surgery recommending local wound care and pressure displacement otherwise no surgical I&D at this time. Wound care recs noted as below. Wound appears to be much better today.  Medically stable for discharge.   Assessment & Plan:  Principal Problem:   Sepsis due to cellulitis [facility]) Active Problems:   Parkinson's disease (HCC)   Multiple wounds of skin   Diabetic polyneuropathy associated with type 2 diabetes mellitus (HCC)   Depression   S/P lumbar laminectomy     Severe sepsis secondary to sacral cellulitis/Osteomyelitis.  Proteus mirabilis bacteremia Blood cultures growing Proteus Mirabella's susceptibilities reviewed.  ID recommending Augmentin  plus doxycycline  for 6 weeks.  Outpatient appointment 05/21/2024 at 10 AM -CT scan did not show any deep infection only skin ulceration/cellulitis.  MRI consistent with sacral osteomyelitis. - General Surgery recommending local wound care and pressure displacement otherwise no surgical I&D at this time.   Wound care recs : Apply 1/4% Dakins solution to kerlix or 4x4 gauze, pack into sacral wound, cover with ABD pad, secure with tape. Change BID   Chronic urinary retention, Foley present POA CAUTI BPH - Chronically wears Foley catheter.  Last changed by urology 7/28.  Foley  catheter exchanged again in the hospital 8/1 -Flomax    History of Parkinson disease Dysphagia - Continue Sinemet  -Reportedly on regular diet    Diabetes mellitus type 2 Diabetic peripheral neuropathy - Sliding scale and Accu-Chek.   GERD - PPI   Depression/anxiety - Continue home medications   Insomnia - Continue home medications   Essential hypertension - Nebivolol .  IV as needed  Hypomagnesemia/hypokalemia - As needed repletion   TOC consulted for placement  DVT prophylaxis: Lovenox      Code Status: Full Code Family Communication:  Sister uptodate.  Status is: Inpatient Remains inpatient appropriate because: SNF placement.     Subjective: NO complaints Pending placement  Examination:  General exam: Appears calm and comfortable  Respiratory system: Clear to auscultation. Respiratory effort normal. Cardiovascular system: S1 & S2 heard, RRR. No JVD, murmurs, rubs, gallops or clicks. No pedal edema. Gastrointestinal system: Abdomen is nondistended, soft and nontender. No organomegaly or masses felt. Normal bowel sounds heard. Central nervous system: Alert and oriented. No focal neurological deficits. Extremities: Symmetric 5 x 5 power. Skin: No rashes, lesions or ulcers Psychiatry: Judgement and insight appear normal. Mood & affect appropriate. Sacral wound with foul-smelling drainage               Diet Orders (From admission, onward)     Start     Ordered   04/23/24 1644  Diet regular Room service appropriate? Yes; Fluid consistency: Thin  Diet effective now       Question Answer Comment  Room service appropriate? Yes   Fluid consistency: Thin      04/23/24 1644            Objective: Vitals:   04/29/24 1524 04/29/24  2025 04/30/24 0404 04/30/24 0749  BP: (!) 144/74 (!) 140/77 (!) 140/86 (!) 153/84  Pulse: 79 76 67 72  Resp: 16 16  16   Temp: 99.5 F (37.5 C) 99 F (37.2 C) 97.9 F (36.6 C) 98.4 F (36.9 C)  TempSrc: Oral Oral  Oral Oral  SpO2: 94% 94% 94% 91%  Weight:      Height:        Intake/Output Summary (Last 24 hours) at 04/30/2024 1104 Last data filed at 04/30/2024 0300 Gross per 24 hour  Intake --  Output 800 ml  Net -800 ml   Filed Weights   04/23/24 1812  Weight: 94.2 kg    Scheduled Meds:  amoxicillin -clavulanate  1 tablet Oral Q12H   buPROPion   300 mg Oral Daily   carbidopa -levodopa   1 tablet Oral TID   Chlorhexidine  Gluconate Cloth  6 each Topical Q0600   collagenase    Topical Daily   doxycycline   100 mg Oral Q12H   DULoxetine   120 mg Oral Daily   dutasteride   0.5 mg Oral Daily   feeding supplement  237 mL Oral BID BM   hydrocortisone  cream   Topical BID   insulin  aspart  0-15 Units Subcutaneous TID WC   lithium  carbonate  150 mg Oral QPM   liver oil-zinc  oxide   Topical BID   mirtazapine   15 mg Oral QHS   multivitamin with minerals  1 tablet Oral Daily   nebivolol   2.5 mg Oral Daily   nutrition supplement (JUVEN)  1 packet Oral BID BM   pantoprazole   40 mg Oral Daily   polyethylene glycol  17 g Oral BID   tamsulosin   0.4 mg Oral q1800   Continuous Infusions:  Nutritional status Signs/Symptoms: estimated needs Interventions: Ensure Enlive (each supplement provides 350kcal and 20 grams of protein), MVI, Juven Body mass index is 25.96 kg/m.  Data Reviewed:   CBC: Recent Labs  Lab 04/23/24 1532 04/23/24 1643 04/25/24 0804 04/26/24 0605 04/27/24 0707 04/28/24 0630 04/29/24 0657  WBC 20.6*   < > 14.3* 13.8* 12.5* 13.1* 14.9*  NEUTROABS 18.0*  --   --   --   --   --   --   HGB 11.4*   < > 10.0* 10.0* 10.5* 10.2* 10.5*  HCT 36.1*   < > 30.8* 30.8* 32.7* 32.0* 33.0*  MCV 90.9   < > 89.5 88.8 89.8 89.6 89.9  PLT 424*   < > 376 384 408* 437* 474*   < > = values in this interval not displayed.   Basic Metabolic Panel: Recent Labs  Lab 04/24/24 0919 04/25/24 0804 04/26/24 0605 04/27/24 0707 04/28/24 0630 04/29/24 0657  NA 132* 135 135 137 138 138  K 3.5 3.6 3.3*  4.0 3.7 3.8  CL 100 103 102 104 106 103  CO2 21* 23 24 27 26 28   GLUCOSE 112* 114* 116* 105* 109* 100*  BUN 19 13 15 13 12 14   CREATININE 0.69 0.78 0.58* 0.62 0.63 0.74  CALCIUM 8.0* 8.1* 8.0* 8.2* 8.3* 8.7*  MG 1.3* 1.7 1.5* 1.7 1.7 1.6*  PHOS 3.9  --   --   --   --   --    GFR: Estimated Creatinine Clearance: 102.7 mL/min (by C-G formula based on SCr of 0.74 mg/dL). Liver Function Tests: Recent Labs  Lab 04/23/24 1532  AST 18  ALT 16  ALKPHOS 65  BILITOT 0.7  PROT 6.5  ALBUMIN  2.6*   No results for input(s): LIPASE,  AMYLASE in the last 168 hours. No results for input(s): AMMONIA in the last 168 hours. Coagulation Profile: Recent Labs  Lab 04/23/24 1532  INR 1.2   Cardiac Enzymes: No results for input(s): CKTOTAL, CKMB, CKMBINDEX, TROPONINI in the last 168 hours. BNP (last 3 results) No results for input(s): PROBNP in the last 8760 hours. HbA1C: No results for input(s): HGBA1C in the last 72 hours. CBG: Recent Labs  Lab 04/29/24 1012 04/29/24 1130 04/29/24 1637 04/29/24 2140 04/30/24 0618  GLUCAP 128* 139* 129* 118* 108*   Lipid Profile: No results for input(s): CHOL, HDL, LDLCALC, TRIG, CHOLHDL, LDLDIRECT in the last 72 hours. Thyroid Function Tests: No results for input(s): TSH, T4TOTAL, FREET4, T3FREE, THYROIDAB in the last 72 hours. Anemia Panel: No results for input(s): VITAMINB12, FOLATE, FERRITIN, TIBC, IRON, RETICCTPCT in the last 72 hours. Sepsis Labs: Recent Labs  Lab 04/23/24 1537 04/23/24 1726  LATICACIDVEN 2.5* 2.4*    Recent Results (from the past 240 hours)  Blood Culture (routine x 2)     Status: Abnormal   Collection Time: 04/23/24  2:52 PM   Specimen: BLOOD  Result Value Ref Range Status   Specimen Description BLOOD SITE NOT SPECIFIED  Final   Special Requests   Final    BOTTLES DRAWN AEROBIC AND ANAEROBIC Blood Culture adequate volume   Culture  Setup Time   Final    GRAM  NEGATIVE RODS IN BOTH AEROBIC AND ANAEROBIC BOTTLES CRITICAL RESULT CALLED TO, READ BACK BY AND VERIFIED WITH: MAYA MAFFUCCI [id number] FCP Performed at [facility], [address] [place], [place] [address]    Culture PROTEUS MIRABILIS (A)  Final   Report Status 04/26/2024 FINAL  Final   Organism ID, Bacteria PROTEUS MIRABILIS  Final   Organism ID, Bacteria PROTEUS MIRABILIS  Final      Susceptibility   Proteus mirabilis - KIRBY BAUER*    CEFAZOLIN  RESISTANT Resistant    Proteus mirabilis - MIC*    AMPICILLIN  <=2 SENSITIVE Sensitive     CEFEPIME  <=0.12 SENSITIVE Sensitive     CEFTAZIDIME <=1 SENSITIVE Sensitive     CEFTRIAXONE  <=0.25 SENSITIVE Sensitive     CIPROFLOXACIN <=0.25 SENSITIVE Sensitive     GENTAMICIN <=1 SENSITIVE Sensitive     IMIPENEM 8 INTERMEDIATE Intermediate     TRIMETH/SULFA <=20 SENSITIVE Sensitive     AMPICILLIN /SULBACTAM <=2 SENSITIVE Sensitive     PIP/TAZO <=4 SENSITIVE Sensitive ug/mL    * PROTEUS MIRABILIS    PROTEUS MIRABILIS  Blood Culture ID Panel (Reflexed)     Status: Abnormal   Collection Time: 04/23/24  2:52 PM  Result Value Ref Range Status   Enterococcus faecalis NOT DETECTED NOT DETECTED Final   Enterococcus Faecium NOT DETECTED NOT DETECTED Final   Listeria monocytogenes NOT DETECTED NOT DETECTED Final   Staphylococcus species NOT DETECTED NOT DETECTED Final   Staphylococcus aureus (BCID) NOT DETECTED NOT DETECTED Final   Staphylococcus epidermidis NOT DETECTED NOT DETECTED Final   Staphylococcus lugdunensis NOT DETECTED NOT DETECTED Final   Streptococcus species NOT DETECTED NOT DETECTED Final   Streptococcus agalactiae NOT DETECTED NOT DETECTED Final   Streptococcus pneumoniae NOT DETECTED NOT DETECTED Final   Streptococcus pyogenes NOT DETECTED NOT DETECTED Final   A.calcoaceticus-baumannii NOT DETECTED NOT DETECTED Final   Bacteroides fragilis NOT DETECTED NOT DETECTED Final   Enterobacterales DETECTED (A) NOT DETECTED Final     Comment: Enterobacterales represent a large order of gram negative bacteria, not a single organism. CRITICAL RESULT  CALLED TO, READ BACK BY AND VERIFIED WITH: PHARMD BLAKE [id number] 0722 FCP    Enterobacter cloacae complex NOT DETECTED NOT DETECTED Final   Escherichia coli NOT DETECTED NOT DETECTED Final   Klebsiella aerogenes NOT DETECTED NOT DETECTED Final   Klebsiella oxytoca NOT DETECTED NOT DETECTED Final   Klebsiella pneumoniae NOT DETECTED NOT DETECTED Final   Proteus species DETECTED (A) NOT DETECTED Final    Comment: CRITICAL RESULT CALLED TO, READ BACK BY AND VERIFIED WITH: PHARMD BLAKE [id number] 0722 FCP    Salmonella species NOT DETECTED NOT DETECTED Final   Serratia marcescens NOT DETECTED NOT DETECTED Final   Haemophilus influenzae NOT DETECTED NOT DETECTED Final   Neisseria meningitidis NOT DETECTED NOT DETECTED Final   Pseudomonas aeruginosa NOT DETECTED NOT DETECTED Final   Stenotrophomonas maltophilia NOT DETECTED NOT DETECTED Final   Candida albicans NOT DETECTED NOT DETECTED Final   Candida auris NOT DETECTED NOT DETECTED Final   Candida glabrata NOT DETECTED NOT DETECTED Final   Candida krusei NOT DETECTED NOT DETECTED Final   Candida parapsilosis NOT DETECTED NOT DETECTED Final   Candida tropicalis NOT DETECTED NOT DETECTED Final   Cryptococcus neoformans/gattii NOT DETECTED NOT DETECTED Final   CTX-M ESBL NOT DETECTED NOT DETECTED Final   Carbapenem resistance IMP NOT DETECTED NOT DETECTED Final   Carbapenem resistance KPC NOT DETECTED NOT DETECTED Final   Carbapenem resistance NDM NOT DETECTED NOT DETECTED Final   Carbapenem resist OXA 48 LIKE NOT DETECTED NOT DETECTED Final   Carbapenem resistance VIM NOT DETECTED NOT DETECTED Final    Comment: Performed at [facility], [address], [place], [place] [address]  Urine Culture     Status: Abnormal   Collection Time: 04/23/24  3:32 PM   Specimen: Urine, Random  Result Value Ref Range Status    Specimen Description URINE, RANDOM  Final   Special Requests   Final    NONE Reflexed from [id number] Performed at [facility], [address], [place], [place] [address]    Culture MULTIPLE SPECIES PRESENT, SUGGEST RECOLLECTION (A)  Final   Report Status 04/24/2024 FINAL  Final  Resp panel by RT-PCR (RSV, Flu A&B, Covid) Anterior Nasal Swab     Status: None   Collection Time: 04/23/24  5:03 PM   Specimen: Anterior Nasal Swab  Result Value Ref Range Status   SARS Coronavirus 2 by RT PCR NEGATIVE NEGATIVE Final   Influenza A by PCR NEGATIVE NEGATIVE Final   Influenza B by PCR NEGATIVE NEGATIVE Final    Comment: (NOTE) The Xpert Xpress SARS-CoV-2/FLU/RSV plus assay is intended as an aid in the diagnosis of influenza from Nasopharyngeal swab specimens and should not be used as a sole basis for treatment. Nasal washings and aspirates are unacceptable for Xpert Xpress SARS-CoV-2/FLU/RSV testing.  Fact Sheet for Patients: BloggerCourse.com  Fact Sheet for Healthcare Providers: SeriousBroker.it  This test is not yet approved or cleared by the [place]  FDA and has been authorized for detection and/or diagnosis of SARS-CoV-2 by FDA under an Emergency Use Authorization (EUA). This EUA will remain in effect (meaning this test can be used) for the duration of the COVID-19 declaration under Section 564(b)(1) of the Act, 21 U.S.C. section 360bbb-3(b)(1), unless the authorization is terminated or revoked.     Resp Syncytial Virus by PCR NEGATIVE NEGATIVE Final    Comment: (NOTE) Fact Sheet for Patients: BloggerCourse.com  Fact Sheet for Healthcare Providers: SeriousBroker.it  This test is not yet approved or  cleared by the [place]  FDA and has been authorized for detection and/or diagnosis of SARS-CoV-2 by FDA under an Emergency Use Authorization (EUA). This EUA will  remain in effect (meaning this test can be used) for the duration of the COVID-19 declaration under Section 564(b)(1) of the Act, 21 U.S.C. section 360bbb-3(b)(1), unless the authorization is terminated or revoked.  Performed at [facility], [address] [place], [place] [address]   Blood Culture (routine x 2)     Status: None   Collection Time: 04/23/24  6:25 PM   Specimen: BLOOD LEFT HAND  Result Value Ref Range Status   Specimen Description BLOOD LEFT HAND  Final   Special Requests   Final    BOTTLES DRAWN AEROBIC ONLY Blood Culture results may not be optimal due to an inadequate volume of blood received in culture bottles   Culture   Final    NO GROWTH 5 DAYS Performed at [facility], [address], [place], [place] [address]    Report Status 04/28/2024 FINAL  Final  MRSA Next Gen by PCR, Nasal     Status: None   Collection Time: 04/24/24  7:52 AM   Specimen: Nasal Mucosa; Nasal Swab  Result Value Ref Range Status   MRSA by PCR Next Gen NOT DETECTED NOT DETECTED Final    Comment: (NOTE) The GeneXpert MRSA Assay (FDA approved for NASAL specimens only), is one component of a comprehensive MRSA colonization surveillance program. It is not intended to diagnose MRSA infection nor to guide or monitor treatment for MRSA infections. Test performance is not FDA approved in patients less than 59 years old. Performed at [facility], [address], [place], [place] [address]          Radiology Studies: No results found.         LOS: 7 days   Time spent= 35 mins    Burgess JAYSON Dare, MD [facility]  If 7PM-7AM, please contact night-coverage  04/30/2024, 11:04 AM

## 2024-04-30 NOTE — TOC Transition Note (Signed)
 Transition of Care [facility]) - Discharge Note   Patient Details  Name: Douglas Price MRN: [id number] Date of Birth: 06-15-1951  Transition of [facility]) CM/SW Contact:  Gwenn Julien [place], [place] Phone Number: 04/30/2024, 11:19 AM   Clinical Narrative:  Pt for dc to [facility] today. Spoke to [place] in admissions who confirmed they received auth and are prepared to admit pt to room 405. Pt's sister Niels aware of dc and reports agreeable. RN provided with number for report and [facility] arranged for transport. SW signing off at dc.   Julien Gwenn, MSW, LCSW [phone] (coverage)       Final next level of care: Skilled Nursing Facility Barriers to Discharge: Barriers Resolved   Patient Goals and CMS Choice   CMS Medicare.gov Compare Post Acute Care list provided to:: Patient Choice offered to / list presented to : Patient [place] ownership interest in [facility].provided to:: Patient    Discharge Placement              Patient chooses bed at: [facility] Patient to be transferred to facility by: [facility] Name of family member notified: Carol/sister Patient and family notified of of transfer: 04/30/24  Discharge Plan and Services Additional resources added to the After Visit Summary for                                       Social Drivers of Health (SDOH) Interventions SDOH Screenings   Food Insecurity: No Food Insecurity (02/16/2024)  Housing: Low Risk  (02/16/2024)  Transportation Needs: No Transportation Needs (02/16/2024)  Utilities: Not At Risk (02/16/2024)  Depression (PHQ2-9): Medium Risk (02/11/2023)  Financial Resource Strain: Low Risk  (02/07/2023)  Social Connections: Moderately Isolated (02/16/2024)  Tobacco Use: High Risk (04/23/2024)     Readmission Risk Interventions     No data to display

## 2024-04-30 NOTE — Progress Notes (Signed)
 Report given to [facility] LPN at [facility].   Metta Moats, BSN RN

## 2024-05-05 ENCOUNTER — Ambulatory Visit (INDEPENDENT_AMBULATORY_CARE_PROVIDER_SITE_OTHER): Admitting: Psychiatry

## 2024-05-05 DIAGNOSIS — E1142 Type 2 diabetes mellitus with diabetic polyneuropathy: Secondary | ICD-10-CM

## 2024-05-05 DIAGNOSIS — M48062 Spinal stenosis, lumbar region with neurogenic claudication: Secondary | ICD-10-CM | POA: Diagnosis not present

## 2024-05-05 DIAGNOSIS — F4010 Social phobia, unspecified: Secondary | ICD-10-CM

## 2024-05-05 DIAGNOSIS — G20A1 Parkinson's disease without dyskinesia, without mention of fluctuations: Secondary | ICD-10-CM | POA: Diagnosis not present

## 2024-05-05 DIAGNOSIS — F331 Major depressive disorder, recurrent, moderate: Secondary | ICD-10-CM | POA: Diagnosis not present

## 2024-05-05 NOTE — Progress Notes (Signed)
 Psychotherapy Progress Note [facility] [name], PhD LP  Patient ID: Douglas Price)    MRN: [id number] Therapy format: Individual psychotherapy Date: 05/05/2024      Start: 8:16a     Stop: 9:06a     Time Spent: 50 min Location: Telehealth visit -- I connected with this patient by an approved telecommunication method (audio only), with his informed consent, and verifying identity and patient privacy.  I was located at my office and patient at his nursing home rehab unit.  As needed, we discussed the limitations, risks, and security and privacy concerns associated with telehealth service, including the availability and conditions which currently govern in-person appointments and the possibility that 3rd-party payment may not be fully guaranteed and he may be responsible for charges.  After he indicated understanding, we proceeded with the session.  Also discussed treatment planning, as needed, including ongoing verbal agreement with the plan, the opportunity to ask and answer all questions, his demonstrated understanding of instructions, and his readiness to call the office should symptoms worsen or he feels he is in a crisis state and needs more immediate and tangible assistance.   Session narrative (presenting needs, interim history, self-report of stressors and symptoms, applications of prior therapy, status changes, and interventions made in session) Reached at [facility] rehab, 1st contact since April.  Recounts falling in his bedroom, hit his head above the eye, on the floor 36 hrs, until SRO from school called [employer] for a wellness check, and they found him dehydrated and weakly crying out, unable to reach a drink on his end table or phone.  Went through hospitalization, d/c to [facility] rehab about 30 days, developed sepsis, returned to hospital.   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  [name] has been active advocate, researched Medicaid  eligibility.  Understand he'll have to sell al his assets.  Knows he won't be driving again because of his feet.  Went through a high fever since being at [facility].    Feels guilty that all this happened, not having a Medicalert bracelet or phone handy.  Wonders why he feels guilty; suggested he is guilt-prone in the first place for many years geared to self-blame in adversity, but also it's a reflex trying to assign blame, and reasoned out enough that he has a condition that might require more available communications, and he has been trying to play out his hand without needing anything.  Had an utterance from God recently that God will take care of him, having worried so much about how he'll make ends meet, etc.  Noted that he is not having to worry about broken AC and maintenance needs being in a facility now.  Rehab includes some increased PT but he feels it's relatively little.  Assisted hall walking, with w/c as makeshift walker and rescue.  Trying to sit up for longer periods, having weakened.  Hoyer lift in regular use for in/out of bed.  Notes a pressure sore on his low back, with daily bandage changes that get painful.  Says not all nursing staff comply with the schedule, actually, but he has been able to reach Kava to notify him of slack caregiving, which is not everyone.  Says it's hard to get attention for pain, and alleges nursing staff make up the rules as they go sometimes.  Aware that nurse manager, social worker, bob, and a host (ombudsman) are his go-to people for needs or complaints.  Encouraged  to stay ready to speak up if care is being shortchanged.  Wants to deal with his change of situation.  Concern for the house and forfeiting his possessions while he is in [place] spend down, which he does not understand well.  Oriented to need-based state insurance coverage and conditions.  Squeamish about a high-priced attorney being brought on board (probably asset protection) and the idea  of his house.  Aware he went through r-[id number]-g-m something (rhabdomyolysis).  Educated on how that and sepsis are both fatiguing, energy-draining problems after they stop being potentially fatal, and it may well mean an extended time to recover an uncertain amount in nerve function and muscle strength, given how much Parkinson's had already eroded him.  Re loss of career, at 28, reframed his new job as therapy, healing, and seeing how far he can restore.  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    Misses his collection of hundreds of books at home (the subject of hoarding concern from sibs).  They've been companions a long time living alone, as well as his intellectual tool chest of references.  Offered that he undoubtedly cannot maintain his collection at this point, but he could take up his sibs on the offer of a laptop or tablet as his best, most portable alternative access to knowledge.  Framed his situation as equivalent to a house fire, with the need to carry only what he can carry and is most valuable to him.  One recommendation to get pictures of his house and to consider offering a kind of prayer service for it in which he thanks the place for its service before commending it to its own kind of grave, something of a funeral ritual for his plan/dream of living there longer.  Reminded also that the house had become an albatross, with much too much maintenance to manage, certainly in his condition, and wisest to empower his brother to sell it.    Therapeutic modalities: Cognitive Behavioral Therapy, Solution-Oriented/Positive Psychology, Environmental manager, and Faith-sensitive  Mental Status/Observations:  Appearance:   Not assessed     Behavior:  Appropriate  Motor:  Not assessed  Speech/Language:   Clear and Coherent  Affect:  Not assessed  Mood:  dysthymic  Thought process:  normal   Thought content:    WNL  Sensory/Perceptual disturbances:    WNL  Orientation:  Fully oriented  Attention:  Good    Concentration:  Good  Memory:  grossly intact  Insight:    Fair  Judgment:   Fair  Impulse Control:  Good   Risk Assessment: Danger to Self: No Self-injurious Behavior: No Danger to Others: No Physical Aggression / Violence: No Duty to Warn: No Access to Firearms a concern: No  Assessment of progress:  stabilized after substantial medical and mental status challenges  Diagnosis:   ICD-10-CM   1. Major depressive disorder, recurrent episode, moderate (HCC)  F33.1     2. Social anxiety disorder  F40.10     3. Parkinson's disease without dyskinesia or fluctuating manifestations (HCC)  G20.A1     4. 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 Diabetic polyneuropathy associated with type 2 diabetes mellitus (HCC)  E11.42      Plan:  Parkinson's and other health care -- Remains in facility care for the foreseeable future at what sounds to be a skilled nursing or intensive  assisted living level for the foreseeable future.  Defer to physician of record and neurologist for treatment.  Endorse rehab service and PT, may recommend OT if not already engaged.  Ongoing encouragement to check assumptions with all doctors, don't settle for cynicism or what feelings say is not worth the effort or expense and follow through with other needed procedures as they arise.  Ensure good hydration.  If adding sedating medication of any kind, consider reducing Klonopin  or checking blood pressure regime to compensate for fall risk. Financial concerns and family relations -- Continue to empower B Octaviano to address financial needs and responsibilities.  I would at this point endorse POA if needed based on Bill's limited comprehension and stamina, but worth all attempts to inform Zell about his own affairs before determining.  Recommend consent and carry through house  clearing, reduction in possessions, and home liquidation under atty's advice, per the applicable rules of Medicaid eligibility and in keeping with actual costs and coverage of care.  Practice trust in B and S to have his best interests in mind and to be willing within their means to augment his living and underwrite useful expenses not covered.  Notice and dispute any idealistic thinking that sibs should just see and figure things out and make offers without being asked.  Dispute any unnecessarily cynical thinking about them not caring.  For all needs, practice being clear and direct as possible about requests, including willing to ask how he's coming across, and appropriately grateful for any help provided. Social isolation -- Endorse participating in unit activities, befriending fellow residents, and receiving visitors.   Other recommendations/advice -- As may be noted above.  Continue to utilize previously learned skills ad lib. Medication compliance -- Maintain medication as prescribed and work faithfully with relevant prescriber(s) if any changes are desired or seem indicated. Crisis service -- Aware of call list and work-in appts.  Call the clinic on-call service, 988/hotline, 911, or present to [facility] or ER if any life-threatening psychiatric crisis. Followup -- Return for time as already scheduled.  Next scheduled visit with me 06/12/2024.  Next scheduled in this office 06/12/2024.  [name], PhD [name], PhD LP Clinical Psychologist, [facility] [address] [place], [place] [address] [phone]

## 2024-05-07 DIAGNOSIS — R7881 Bacteremia: Secondary | ICD-10-CM | POA: Diagnosis not present

## 2024-05-07 DIAGNOSIS — F419 Anxiety disorder, unspecified: Secondary | ICD-10-CM | POA: Diagnosis not present

## 2024-05-07 DIAGNOSIS — E1142 Type 2 diabetes mellitus with diabetic polyneuropathy: Secondary | ICD-10-CM | POA: Diagnosis not present

## 2024-05-07 DIAGNOSIS — G4700 Insomnia, unspecified: Secondary | ICD-10-CM | POA: Diagnosis not present

## 2024-05-07 DIAGNOSIS — F319 Bipolar disorder, unspecified: Secondary | ICD-10-CM | POA: Diagnosis not present

## 2024-05-07 DIAGNOSIS — A4189 Other specified sepsis: Secondary | ICD-10-CM | POA: Diagnosis not present

## 2024-05-08 ENCOUNTER — Ambulatory Visit: Admitting: Neurology

## 2024-05-21 ENCOUNTER — Other Ambulatory Visit: Payer: Self-pay

## 2024-05-21 ENCOUNTER — Encounter: Payer: Self-pay | Admitting: Internal Medicine

## 2024-05-21 ENCOUNTER — Ambulatory Visit: Payer: Self-pay | Admitting: Internal Medicine

## 2024-05-21 VITALS — BP 94/60 | HR 88 | Temp 98.0°F

## 2024-05-21 DIAGNOSIS — M869 Osteomyelitis, unspecified: Secondary | ICD-10-CM

## 2024-05-21 DIAGNOSIS — B964 Proteus (mirabilis) (morganii) as the cause of diseases classified elsewhere: Secondary | ICD-10-CM | POA: Diagnosis not present

## 2024-05-21 DIAGNOSIS — L89159 Pressure ulcer of sacral region, unspecified stage: Secondary | ICD-10-CM | POA: Diagnosis not present

## 2024-05-21 DIAGNOSIS — R7881 Bacteremia: Secondary | ICD-10-CM | POA: Diagnosis not present

## 2024-05-21 NOTE — Progress Notes (Signed)
 Patient Active Problem List   Diagnosis Date Noted   Acute urinary retention 02/18/2024   Polypharmacy 02/15/2024   Hypokalemia 02/15/2024   Multiple wounds of skin 02/14/2024   Sepsis due to cellulitis (HCC) 02/14/2024   Transaminitis 02/14/2024   Fall at home, initial encounter 02/14/2024   Gait disturbance 02/14/2024   Depression 02/14/2024   Insomnia 02/14/2024   BPH (benign prostatic hyperplasia) 02/14/2024   GERD (gastroesophageal reflux disease) 02/14/2024   Pain due to onychomycosis of toenails of both feet 01/31/2023   Parkinson's disease (HCC) 11/25/2017   Diabetic polyneuropathy associated with type 2 diabetes mellitus (HCC) 11/25/2017   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   S/P lumbar spinal fusion 03/14/2016   S/P lumbar laminectomy 09/08/2015   Sleep disturbance 03/24/2014    Patient's Medications  New Prescriptions   No medications on file  Previous Medications   ACETAMINOPHEN  (TYLENOL ) 325 MG TABLET    Take 650 mg by mouth every 6 (six) hours as needed.   AMOXICILLIN -CLAVULANATE (AUGMENTIN ) 875-125 MG TABLET    Take 1 tablet by mouth every 12 (twelve) hours.   BISACODYL  (DULCOLAX) 5 MG EC TABLET    Take 2 tablets (10 mg total) by mouth daily as needed for moderate constipation.   BUPROPION  (WELLBUTRIN  XL) 300 MG 24 HR TABLET    TAKE 1 TABLET(300 MG) BY MOUTH DAILY   CARBIDOPA -LEVODOPA  (SINEMET  IR) 25-100 MG TABLET    Take 1 tablet by mouth 3 (three) times daily.   CLONAZEPAM  (KLONOPIN ) 0.5 MG TABLET    Take 1 tablet (0.5 mg total) by mouth 2 (two) times daily as needed for anxiety.   COLLAGENASE  (SANTYL ) 250 UNIT/GM OINTMENT    Apply topically daily.   DOXYCYCLINE  (VIBRA -TABS) 100 MG TABLET    Take 1 tablet (100 mg total) by mouth every 12 (twelve) hours.   DULOXETINE  (CYMBALTA ) 60 MG CAPSULE    Take 120 mg by mouth daily.    DUTASTERIDE  (AVODART ) 0.5 MG CAPSULE    Take 0.5 mg by mouth daily.   FEEDING  SUPPLEMENT (ENSURE PLUS HIGH PROTEIN) LIQD    Take 237 mLs by mouth 2 (two) times daily between meals.   HYDROCORTISONE  CREAM 1 %    Apply topically 2 (two) times daily.   LITHIUM  CARBONATE 150 MG CAPSULE    TAKE 1 CAPSULE(150 MG) BY MOUTH EVERY EVENING   LIVER OIL-ZINC  OXIDE (DESITIN) 40 % OINTMENT    Apply topically 2 (two) times daily.   LORATADINE (CLARITIN) 10 MG TABLET    Take 10 mg by mouth daily as needed for allergies, rhinitis or itching.   METFORMIN  (GLUCOPHAGE ) 500 MG TABLET    Take 1,000 mg by mouth 2 (two) times daily with a meal.   MIRTAZAPINE  (REMERON ) 15 MG TABLET    Take 15 mg by mouth at bedtime.   MULTIPLE VITAMIN (MULTIVITAMIN WITH MINERALS) TABS TABLET    Take 1 tablet by mouth daily.   NEBIVOLOL  (BYSTOLIC ) 2.5 MG TABLET    Take 2.5 mg by mouth daily.   NUTRITION SUPPLEMENT, JUVEN, (JUVEN) PACK    Take 1 packet by mouth 2 (two) times daily between meals.   OMEPRAZOLE (PRILOSEC) 20 MG CAPSULE    Take 20 mg by mouth 2 (two) times daily before a meal.    OXYCODONE  (OXY IR/ROXICODONE ) 5 MG IMMEDIATE RELEASE TABLET    Take 1-2 tablets (5-10 mg total) by mouth every 4 (four) hours  as needed for moderate pain (pain score 4-6).   POLYETHYLENE GLYCOL (MIRALAX  / GLYCOLAX ) 17 G PACKET    Take 17 g by mouth daily.   PREGABALIN  (LYRICA ) 75 MG CAPSULE    Take 1 capsule (75 mg total) by mouth 2 (two) times daily.   TAMSULOSIN  (FLOMAX ) 0.4 MG CAPS CAPSULE    Take 0.4 mg by mouth daily.   TELMISARTAN  (MICARDIS ) 40 MG TABLET    Take 40 mg by mouth daily.   TEMAZEPAM  (RESTORIL ) 15 MG CAPSULE    Take 1 capsule (15 mg total) by mouth at bedtime as needed for sleep.  Modified Medications   No medications on file  Discontinued Medications   No medications on file    Subjective: 73 year old male with past medical history of Parkinson's disease, chronic Foley, GERD, RLS, diabetes type 2 presents for hospital follow-up of Douglas Price's bacteremia in the setting of stage IV decubitus ulcer.   Source of bacteremia was likely sacral ulcer.  Necrotic eschar on ulcer probably could benefit from wound care noted.  General surgery recommended local wound care and pressure displacement otherwise no surgical I&D recommended. - Patient was discharged onDoxycycline and Augmentin  x 6 weeks EOT 9/11 Review of Systems: Review of Systems  All other systems reviewed and are negative.   Past Medical History:  Diagnosis Date   Allergic rhinitis due to pollen    Benign neoplasm of colon    Blisters with epidermal loss due to burn (second degree) of foot    Complication of anesthesia    pt woke up during saphenous vein surgery-had epidural   Constipation due to pain medication    Degeneration of lumbar or lumbosacral intervertebral disc    Diverticulosis of colon (without mention of hemorrhage)    DM (diabetes mellitus) (HCC)    Esophageal reflux    Hypertrophy of prostate with urinary obstruction and other lower urinary tract symptoms (LUTS)    Loss of weight    Neuromuscular disorder (HCC)    peripheral neuropathy   Obesity, unspecified    Pain in limb    Restless legs syndrome (RLS)    Sleep disturbance 03/24/2014   Spinal stenosis, unspecified region other than cervical    Tobacco use disorder    Unspecified disease of pericardium    Unspecified essential hypertension    Unspecified local infection of skin and subcutaneous tissue    Varices of other sites     Social History   Tobacco Use   Smoking status: Some Days    Types: Pipe   Smokeless tobacco: Never   Tobacco comments:    moderate, 1-2bowls a day or more  Vaping Use   Vaping status: Never Used  Substance Use Topics   Alcohol use: No    Alcohol/week: 0.0 standard drinks of alcohol   Drug use: No    Family History  Problem Relation Age of Onset   Heart failure Mother    Lung cancer Father    Neuropathy Brother    Hypertension Paternal Grandfather    Tuberculosis Maternal Uncle    Cancer - Colon Maternal  Uncle     Allergies  Allergen Reactions   Avelox [Moxifloxacin] Other (See Comments)    Angioedema Fatigue   Quinolones Other (See Comments)    Angioedema   Lexapro [Escitalopram] Other (See Comments)    unknown    Health Maintenance  Topic Date Due   Medicare Annual Wellness (AWV)  Never done   FOOT EXAM  Never  done   OPHTHALMOLOGY EXAM  Never done   Diabetic kidney evaluation - Urine ACR  Never done   Hepatitis C Screening  Never done   Zoster Vaccines- Shingrix (1 of 2) 06/25/1972   Colonoscopy  Never done   COVID-19 Vaccine (4 - 2024-25 season) 05/26/2023   Pneumococcal Vaccine: 50+ Years (2 of 2 - PCV) 01/10/2024   INFLUENZA VACCINE  04/24/2024   HEMOGLOBIN A1C  08/16/2024   Diabetic kidney evaluation - eGFR measurement  04/29/2025   DTaP/Tdap/Td (2 - Td or Tdap) 05/21/2025   HPV VACCINES  Aged Out   Meningococcal B Vaccine  Aged Out    Objective:  Vitals:   05/21/24 0955  BP: (!) 93/57  Pulse: 88  Temp: 98 F (36.7 C)  TempSrc: Oral  SpO2: 98%   There is no height or weight on file to calculate BMI.  Physical Exam Constitutional:      General: He is not in acute distress.    Appearance: He is normal weight. He is not toxic-appearing.  HENT:     Head: Normocephalic and atraumatic.     Right Ear: External ear normal.     Left Ear: External ear normal.     Nose: No congestion or rhinorrhea.     Mouth/Throat:     Mouth: Mucous membranes are moist.     Pharynx: Oropharynx is clear.  Eyes:     Extraocular Movements: Extraocular movements intact.     Conjunctiva/sclera: Conjunctivae normal.     Pupils: Pupils are equal, round, and reactive to light.  Cardiovascular:     Rate and Rhythm: Normal rate and regular rhythm.     Heart sounds: No murmur heard.    No friction rub. No gallop.  Pulmonary:     Effort: Pulmonary effort is normal.     Breath sounds: Normal breath sounds.  Abdominal:     General: Abdomen is flat. Bowel sounds are normal.      Palpations: Abdomen is soft.  Musculoskeletal:        General: No swelling.     Cervical back: Normal range of motion and neck supple.  Skin:    General: Skin is warm and dry.  Neurological:     General: No focal deficit present.     Mental Status: He is oriented to person, place, and time.  Psychiatric:        Mood and Affect: Mood normal.    Lab Results Lab Results  Component Value Date   WBC 14.9 (H) 04/29/2024   HGB 10.5 (L) 04/29/2024   HCT 33.0 (L) 04/29/2024   MCV 89.9 04/29/2024   PLT 474 (H) 04/29/2024    Lab Results  Component Value Date   CREATININE 0.74 04/29/2024   BUN 14 04/29/2024   NA 138 04/29/2024   K 3.8 04/29/2024   CL 103 04/29/2024   CO2 28 04/29/2024    Lab Results  Component Value Date   ALT 16 04/23/2024   AST 18 04/23/2024   ALKPHOS 65 04/23/2024   BILITOT 0.7 04/23/2024    No results found for: CHOL, HDL, LDLCALC, LDLDIRECT, TRIG, CHOLHDL No results found for: LABRPR, RPRTITER No results found for: HIV1RNAQUANT, HIV1RNAVL, CD4TABS   Problem List Items Addressed This Visit   None  Results   Assessment/Plan #Douglas mirabilis bacteremia secondary to sacral decubitus ulcer - Blood cultures on 7/31 grew 1 out of 2 Douglas mirabilis - 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 - General Surgery was consulted and recommended local wound care and pressure offloading. Plan - Complete doxycycline  and Augmentin  x 2 weeks weeks EOT 9/11 - Follow-up with Dr. Overton in about a month or so off of antibiotics -Labs today - Continue aggressive local wound care  Douglas Stank, MD [facility] 05/21/2024, 10:09 AM   I have personally spent 69 minutes involved in face-to-face and non-face-to-face activities for this patient on the day of the visit.  Professional time spent includes the following activities: Preparing to see the patient (review of tests), Obtaining and/or reviewing separately obtained history (admission/discharge record), Performing a medically appropriate examination and/or evaluation , Ordering medications/tests/procedures, referring and communicating with other health care professionals, Documenting clinical information in the EMR, Independently interpreting results (not separately reported), Communicating results to the patient/family/caregiver, Counseling and educating the patient/family/caregiver and Care coordination (not separately reported).

## 2024-05-21 NOTE — Patient Instructions (Signed)
 Complete doxy and augmentin  till 9/11 Wound care referral placed We did labs today Follow up in one month

## 2024-05-22 LAB — COMPLETE METABOLIC PANEL WITHOUT GFR
AG Ratio: 1.2 (calc) (ref 1.0–2.5)
ALT: 16 U/L (ref 9–46)
AST: 18 U/L (ref 10–35)
Albumin: 3.9 g/dL (ref 3.6–5.1)
Alkaline phosphatase (APISO): 93 U/L (ref 35–144)
BUN/Creatinine Ratio: 33 (calc) — ABNORMAL HIGH (ref 6–22)
BUN: 26 mg/dL — ABNORMAL HIGH (ref 7–25)
CO2: 29 mmol/L (ref 20–32)
Calcium: 9.5 mg/dL (ref 8.6–10.3)
Chloride: 99 mmol/L (ref 98–110)
Creat: 0.78 mg/dL (ref 0.70–1.28)
Globulin: 3.2 g/dL (ref 1.9–3.7)
Glucose, Bld: 85 mg/dL (ref 65–99)
Potassium: 4.8 mmol/L (ref 3.5–5.3)
Sodium: 138 mmol/L (ref 135–146)
Total Bilirubin: 0.4 mg/dL (ref 0.2–1.2)
Total Protein: 7.1 g/dL (ref 6.1–8.1)

## 2024-05-22 LAB — CBC WITH DIFFERENTIAL/PLATELET
Absolute Lymphocytes: 1842 {cells}/uL (ref 850–3900)
Absolute Monocytes: 721 {cells}/uL (ref 200–950)
Basophils Absolute: 71 {cells}/uL (ref 0–200)
Basophils Relative: 0.8 %
Eosinophils Absolute: 1077 {cells}/uL — ABNORMAL HIGH (ref 15–500)
Eosinophils Relative: 12.1 %
HCT: 41.9 % (ref 38.5–50.0)
Hemoglobin: 12.8 g/dL — ABNORMAL LOW (ref 13.2–17.1)
MCH: 27.6 pg (ref 27.0–33.0)
MCHC: 30.5 g/dL — ABNORMAL LOW (ref 32.0–36.0)
MCV: 90.5 fL (ref 80.0–100.0)
MPV: 11.1 fL (ref 7.5–12.5)
Monocytes Relative: 8.1 %
Neutro Abs: 5189 {cells}/uL (ref 1500–7800)
Neutrophils Relative %: 58.3 %
Platelets: 367 Thousand/uL (ref 140–400)
RBC: 4.63 Million/uL (ref 4.20–5.80)
RDW: 13.9 % (ref 11.0–15.0)
Total Lymphocyte: 20.7 %
WBC: 8.9 Thousand/uL (ref 3.8–10.8)

## 2024-05-22 LAB — C-REACTIVE PROTEIN: CRP: <3 mg/L (ref <8.0)

## 2024-05-22 LAB — SEDIMENTATION RATE: Sed Rate: 25 mm/h — ABNORMAL HIGH (ref 0–20)

## 2024-05-29 NOTE — Progress Notes (Signed)
 Psychotherapy Progress Note [facility] [name], PhD LP  Patient ID: Douglas Price [facility])    MRN: [id number] Therapy format: Individual psychotherapy Date: 06/12/2024      Start: 2:10p     Stop: 2:54p     Time Spent: 44 min Location: Telehealth visit -- I connected with this patient by an approved telecommunication method (audio only), with his informed consent, and verifying identity and patient privacy.  I was located at my office and patient at his new SNF.  As needed, we discussed the limitations, risks, and security and privacy concerns associated with telehealth service, including the availability and conditions which currently govern in-person appointments and the possibility that 3rd-party payment may not be fully guaranteed and he may be responsible for charges.  After he indicated understanding, we proceeded with the session.  Also discussed treatment planning, as needed, including ongoing verbal agreement with the plan, the opportunity to ask and answer all questions, his demonstrated understanding of instructions, and his readiness to call the office should symptoms worsen or he feels he is in a crisis state and needs more immediate and tangible assistance.   Session narrative (presenting needs, interim history, self-report of stressors and symptoms, applications of prior therapy, status changes, and interventions made in session) 5 wks since last spoken, TC 1:1 in his SNF.  Review of EHR shows ED trip in distress on Wednesday for displaced catheter and hematuria with evidence of UTI.    TC placed 2:10pm as scheduled, no answer.  Turns out it's a defunct number.  Used his current land line at new SNF, [facility].  Reports he is on Medicaid (actually, in the spend down phase headed for Medicaid), his house going on the market, and proceeds to cover a loan from siblings to pay out of pocket care until home sells and he can reimburse them.  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  Sibs have offered to store many of his books in a facility until a further decision can be a made.  Will be allowed to bring 2 short bookcases in, already has a selection with him.  Wants to write, but his dominant right hand doesn't function well enough.  Offered that he may be able to have available to acquire a tablet with speech to text function.  Likely issue to take up with siblings and OT.    Well pleased with OT, PT, and SLP services at [facility].  Does feel self-conscious about going on Medicaid.  Supportively confronted whether he would feel the same about a relative or Tx's mother, who is also in a SNF and in spend down phase.  Encouraged to think of Medicaid as the social agreement and insurance policy that it is, not a badge of shame.  Encouraged also to keep clear that it's OK to grieve his losses, just watch out for rehearsing troubles, and try to follow his own ideal of [employer] soldiers -- acknowledging wounds and working the continued mission they can.  In his case that is healing, therapies, and integrating into life at [facility].   Went into some depth on his ED trip, which began with an injury sustained when the special wounds nurse attempted an overdue Foley catheter change.  Turns out she altered the size from urologist's order (20 to 16), attempted to ram it past an obstruction (swollen prostate) and apparently lacerated  his urethra.  Claims she witnessed blood and walked away wordlessly, though it is apparent from the narrative that EMS was called for ED trip.  Bill's understanding she made herself scarce after that.  He has declared, with S's help, that he will only have the catheter changed by a doctor (urologist), and reportedly the special wounds nurse will stick to treating existing wounds like his skin ulcer, not causing new ones.  Claims she also has a brusque manner  generally, snapping at him that she's not hurting him if he yelps in pain and claiming that she unnecessarily mashed her thumb into his hip to provoke pain beyond the skin issue.  Support/validation provided, discussed inadequacy of his self-report about the complaint and educated on proper engagement for cursing care complaints.  Resolved to contact GORMAN Ivanoff, who is acting pt representative, and says he is willing to be interviewed by admin or ombudsman as needed.  TC to [employer]:  Confirms she is the agent at this time, and she has some agenda already to bring to nursing about slow CNA service.  Confirms he is in the spenddown phase for Medicaid, and that she will be bringing over a sampling of his books and seasonal decor.  Confirms medical decision made to only have catheter changes done by urology.  Understands detail of Bill's complaint, which goes beyond what he told her, and she is willing to contact unit director and/or DON as appropriate, has a care team meeting coming up soon.  Therapeutic modalities: Cognitive Behavioral Therapy, Solution-Oriented/Positive Psychology, Ego-Supportive, and Psycho-education/Bibliotherapy  Mental Status/Observations:  Appearance:   Not assessed     Behavior:  Appropriate  Motor:  Not assessed  Speech/Language:   Clear and Coherent  Affect:  Not assessed  Mood:  depressed  Thought process:  normal  Thought content:    Rumination  Sensory/Perceptual disturbances:    WNL exc pain from wounds  Orientation:  grossly intact  Attention:  Good    Concentration:  Fair  Memory:  grossly intact  Insight:    Fair  Judgment:   Good  Impulse Control:  Good   Risk Assessment: Danger to Self: No Self-injurious Behavior: No Danger to Others: No Physical Aggression / Violence: No Duty to Warn: No Access to Firearms a concern: No  Assessment of progress:  stabilized  Diagnosis:   ICD-10-CM   1. Major depressive disorder, recurrent episode, moderate (HCC)   F33.1     2. Parkinson's disease without dyskinesia or fluctuating manifestations (HCC)  G20.A1     3. Spinal stenosis of lumbar region with neurogenic claudication  M48.062     4. Resides in skilled nursing facility  Z78.9      Plan:  Facility adjustment -- Will work through with family what possessions can be integrated into new living space, sale of his home, financial administration, and notification of issues in nursing care. Parkinson's and other health care -- Remains in facility care for the foreseeable future at what sounds to be a skilled nursing or intensive assisted living level for the foreseeable future.  Defer to physician of record and neurologist for treatment.  Endorse rehab service and PT, may recommend OT if not already engaged.  Ongoing encouragement to check assumptions with all doctors, don't settle for cynicism or what feelings say is not worth the effort or expense and follow through with other needed procedures as they arise.  Ensure good hydration.  If adding sedating medication of any kind, consider reducing  Klonopin  or checking blood pressure regime to compensate for fall risk. Financial concerns and family relations -- Continue to empower B Octaviano to address financial needs and responsibilities.  I would at this point endorse POA if needed based on Bill's limited comprehension and stamina, but worth all attempts to inform Zell about his own affairs before determining.  Recommend consent and carry through house clearing, reduction in possessions, and home liquidation under atty's advice, per the applicable rules of Medicaid eligibility and in keeping with actual costs and coverage of care.  Practice trust in B and S to have his best interests in mind and to be willing within their means to augment his living and underwrite useful expenses not covered.  Notice and dispute any idealistic thinking that sibs should just see and figure things out and make offers without being asked.   Dispute any unnecessarily cynical thinking about them not caring.  For all needs, practice being clear and direct as possible about requests, including willing to ask how he's coming across, and appropriately grateful for any help provided. Social isolation -- Endorse participating in unit activities, befriending fellow residents, and receiving visitors.   Other recommendations/advice -- As may be noted above.  Continue to utilize previously learned skills ad lib. Medication compliance -- Maintain medication as prescribed and work faithfully with relevant prescriber(s) if any changes are desired or seem indicated. Crisis service -- Aware of call list and work-in appts.  Call the clinic on-call service, 988/hotline, 911, or present to [facility] or ER if any life-threatening psychiatric crisis. Followup -- Return for time as available, may need sister to be involved in scheduling.  Next scheduled visit with me Visit date not found.  Next scheduled in this office Visit date not found.  [name], PhD [name], PhD LP Clinical Psychologist, [facility] [address], [address] [place], [place] [address] [phone]

## 2024-06-04 ENCOUNTER — Other Ambulatory Visit: Payer: Self-pay | Admitting: Internal Medicine

## 2024-06-04 MED ORDER — CLONAZEPAM 0.5 MG PO TABS: 0.5000 mg | ORAL_TABLET | Freq: Two times a day (BID) | ORAL | 0 refills | Status: DC | PRN

## 2024-06-10 ENCOUNTER — Emergency Department (HOSPITAL_COMMUNITY)
Admission: EM | Admit: 2024-06-10 | Discharge: 2024-06-11 | Disposition: A | Discharge status: Home / Self Care | Source: Intra-hospital | Attending: Student in an Organized Health Care Education/Training Program | Admitting: Student in an Organized Health Care Education/Training Program

## 2024-06-10 ENCOUNTER — Other Ambulatory Visit: Payer: Self-pay

## 2024-06-10 ENCOUNTER — Emergency Department (HOSPITAL_COMMUNITY)

## 2024-06-10 DIAGNOSIS — Y846 Urinary catheterization as the cause of abnormal reaction of the patient, or of later complication, without mention of misadventure at the time of the procedure: Secondary | ICD-10-CM | POA: Diagnosis not present

## 2024-06-10 DIAGNOSIS — G20A1 Parkinson's disease without dyskinesia, without mention of fluctuations: Secondary | ICD-10-CM | POA: Insufficient documentation

## 2024-06-10 DIAGNOSIS — E119 Type 2 diabetes mellitus without complications: Secondary | ICD-10-CM | POA: Insufficient documentation

## 2024-06-10 DIAGNOSIS — Z7984 Long term (current) use of oral hypoglycemic drugs: Secondary | ICD-10-CM | POA: Insufficient documentation

## 2024-06-10 DIAGNOSIS — R319 Hematuria, unspecified: Secondary | ICD-10-CM

## 2024-06-10 DIAGNOSIS — N3001 Acute cystitis with hematuria: Secondary | ICD-10-CM | POA: Insufficient documentation

## 2024-06-10 DIAGNOSIS — F17200 Nicotine dependence, unspecified, uncomplicated: Secondary | ICD-10-CM | POA: Diagnosis not present

## 2024-06-10 DIAGNOSIS — T83028A Displacement of other indwelling urethral catheter, initial encounter: Secondary | ICD-10-CM | POA: Diagnosis present

## 2024-06-10 DIAGNOSIS — T83021A Displacement of indwelling urethral catheter, initial encounter: Secondary | ICD-10-CM

## 2024-06-10 LAB — BASIC METABOLIC PANEL WITH GFR
Anion gap: 11 (ref 5–15)
BUN: 22 mg/dL (ref 8–23)
CO2: 24 mmol/L (ref 22–32)
Calcium: 8.8 mg/dL — ABNORMAL LOW (ref 8.9–10.3)
Chloride: 102 mmol/L (ref 98–111)
Creatinine, Ser: 0.98 mg/dL (ref 0.61–1.24)
GFR, Estimated: >60 mL/min (ref >60)
Glucose, Bld: 120 mg/dL — ABNORMAL HIGH (ref 70–99)
Potassium: 4.6 mmol/L (ref 3.5–5.1)
Sodium: 137 mmol/L (ref 135–145)

## 2024-06-10 LAB — CBC WITH DIFFERENTIAL/PLATELET
Abs Immature Granulocytes: 0.05 K/uL (ref 0.00–0.07)
Basophils Absolute: 0.1 K/uL (ref 0.0–0.1)
Basophils Relative: 0 %
Eosinophils Absolute: 0.7 K/uL — ABNORMAL HIGH (ref 0.0–0.5)
Eosinophils Relative: 4 %
HCT: 41 % (ref 39.0–52.0)
Hemoglobin: 12.8 g/dL — ABNORMAL LOW (ref 13.0–17.0)
Immature Granulocytes: 0 %
Lymphocytes Relative: 4 %
Lymphs Abs: 0.7 K/uL (ref 0.7–4.0)
MCH: 27.5 pg (ref 26.0–34.0)
MCHC: 31.2 g/dL (ref 30.0–36.0)
MCV: 88 fL (ref 80.0–100.0)
Monocytes Absolute: 0.2 K/uL (ref 0.1–1.0)
Monocytes Relative: 1 %
Neutro Abs: 13.8 K/uL — ABNORMAL HIGH (ref 1.7–7.7)
Neutrophils Relative %: 91 %
Platelets: 309 K/uL (ref 150–400)
RBC: 4.66 MIL/uL (ref 4.22–5.81)
RDW: 15.5 % (ref 11.5–15.5)
WBC: 15.5 K/uL — ABNORMAL HIGH (ref 4.0–10.5)
nRBC: 0 % (ref 0.0–0.2)

## 2024-06-10 LAB — URINALYSIS, ROUTINE W REFLEX MICROSCOPIC
Bilirubin Urine: NEGATIVE
Glucose, UA: NEGATIVE mg/dL
Ketones, ur: NEGATIVE mg/dL
Nitrite: NEGATIVE
Protein, ur: 100 mg/dL — AB
RBC / HPF: >50 RBC/hpf (ref 0–5)
Specific Gravity, Urine: 1.015 (ref 1.005–1.030)
pH: 5 (ref 5.0–8.0)

## 2024-06-10 MED ORDER — CEFAZOLIN SODIUM-DEXTROSE 2-4 GM/100ML-% IV SOLN
2.0000 g | Freq: Once | INTRAVENOUS | Status: AC
  Administered 2024-06-10: 2 g via INTRAVENOUS
  Filled 2024-06-10: qty 100

## 2024-06-10 MED ORDER — LACTATED RINGERS IV BOLUS
500.0000 mL | Freq: Once | INTRAVENOUS | Status: AC
  Administered 2024-06-10: 500 mL via INTRAVENOUS

## 2024-06-10 MED ORDER — HYDROMORPHONE HCL 1 MG/ML IJ SOLN
0.5000 mg | Freq: Once | INTRAMUSCULAR | Status: AC
  Administered 2024-06-10: 0.5 mg via INTRAVENOUS
  Filled 2024-06-10: qty 1

## 2024-06-10 MED ORDER — SODIUM CHLORIDE 0.9 % IR SOLN
3000.0000 mL | Status: DC
  Administered 2024-06-10: 3000 mL

## 2024-06-10 MED ORDER — CEFDINIR 300 MG PO CAPS: 600.0000 mg | ORAL_CAPSULE | Freq: Every day | ORAL | 0 refills | Status: AC

## 2024-06-10 MED ORDER — LACTATED RINGERS IV BOLUS: 500.0000 mL | Freq: Once | INTRAVENOUS | Status: DC

## 2024-06-10 MED ORDER — HYDROMORPHONE HCL 1 MG/ML IJ SOLN
1.0000 mg | Freq: Once | INTRAMUSCULAR | Status: AC
  Administered 2024-06-10: 1 mg via INTRAVENOUS
  Filled 2024-06-10: qty 1

## 2024-06-10 MED ORDER — DIPHENHYDRAMINE HCL 25 MG PO CAPS
25.0000 mg | ORAL_CAPSULE | Freq: Once | ORAL | Status: AC
Start: 2024-06-10 — End: 2024-06-10
  Administered 2024-06-10: 25 mg via ORAL
  Filled 2024-06-10: qty 1

## 2024-06-10 NOTE — Discharge Instructions (Signed)
 Urology evaluated your catheter and it is now working appropriately. We have sent an antibiotic to the pharmacy to take for the next 7 days. Monitor for any fevers, persistent high heart rate, weakness, or changes in your urination.  We are treating you for a UTI after catheter issues.  Return to the ED if there is any concern that her UTI is worsening.

## 2024-06-10 NOTE — ED Notes (Signed)
 Called main lab to inquire about urine culture if more urine is needed and was told no additional urine was required.

## 2024-06-10 NOTE — ED Provider Notes (Signed)
 [place] EMERGENCY DEPARTMENT AT [facility] Provider Note   CSN: [id number] Arrival date & time: 06/10/24  1647     Patient presents with: Hematuria   Douglas Price is a 73 y.o. male.   73 year old male with a past medical history of Parkinson's disease, diabetes, chronic urinary retention with Foley and spinal stenosis brought to the emergency department due to hematuria.  Patient had his Foley catheter exchanged this morning.  He reports that it was a traumatic Foley placement and caused him severe pain.  Afterwards, he has had ongoing gross red blood with clots since then.  This exchange was at 9 AM this morning.  He is still draining some urine but believes he may be having some urinary retention due to the clots.  He is not on blood thinners.  He does report pain in his genital region from the placement.        Prior to Admission medications   Medication Sig Start Date End Date Taking? Authorizing Provider  cefdinir  (OMNICEF ) 300 MG capsule Take 2 capsules (600 mg total) by mouth daily for 7 days. 06/10/24 06/17/24 Yes Marissia Blackham, DO  acetaminophen  (TYLENOL ) 325 MG tablet Take 650 mg by mouth every 6 (six) hours as needed.    [provider]  bisacodyl  (DULCOLAX) 5 MG EC tablet Take 2 tablets (10 mg total) by mouth daily as needed for moderate constipation. 04/27/24   Amin, Ankit C, MD  buPROPion  (WELLBUTRIN  XL) 300 MG 24 hr tablet TAKE 1 TABLET(300 MG) BY MOUTH DAILY 02/18/24   Rhys Boyer T, PA-C  carbidopa -levodopa  (SINEMET  IR) 25-100 MG tablet Take 1 tablet by mouth 3 (three) times daily. 02/27/24   Patsy Lenis, MD  clonazePAM  (KLONOPIN ) 0.5 MG tablet Take 1 tablet (0.5 mg total) by mouth 2 (two) times daily as needed for anxiety. 06/04/24   Amin, Saad, MD  collagenase  (SANTYL ) 250 UNIT/GM ointment Apply topically daily. 04/28/24   Amin, Ankit C, MD  DULoxetine  (CYMBALTA ) 60 MG capsule Take 120 mg by mouth daily.     [provider]  dutasteride   (AVODART ) 0.5 MG capsule Take 0.5 mg by mouth daily.    [provider]  feeding supplement (ENSURE PLUS HIGH PROTEIN) LIQD Take 237 mLs by mouth 2 (two) times daily between meals. 04/27/24   Amin, Ankit C, MD  hydrocortisone  cream 1 % Apply topically 2 (two) times daily. 04/27/24   Amin, Ankit C, MD  lithium  carbonate 150 MG capsule TAKE 1 CAPSULE(150 MG) BY MOUTH EVERY EVENING 02/18/24   Rhys Boyer T, PA-C  liver oil-zinc  oxide (DESITIN) 40 % ointment Apply topically 2 (two) times daily. 04/27/24   Amin, Ankit C, MD  loratadine (CLARITIN) 10 MG tablet Take 10 mg by mouth daily as needed for allergies, rhinitis or itching.    [provider]  metFORMIN  (GLUCOPHAGE ) 500 MG tablet Take 1,000 mg by mouth 2 (two) times daily with a meal.    [provider]  mirtazapine  (REMERON ) 15 MG tablet Take 15 mg by mouth at bedtime.    [provider]  Multiple Vitamin (MULTIVITAMIN WITH MINERALS) TABS tablet Take 1 tablet by mouth daily. 04/27/24   Amin, Ankit C, MD  nebivolol  (BYSTOLIC ) 2.5 MG tablet Take 2.5 mg by mouth daily.    [provider]  nutrition supplement, JUVEN, (JUVEN) PACK Take 1 packet by mouth 2 (two) times daily between meals. 04/27/24   Amin, Ankit C, MD  omeprazole (PRILOSEC) 20 MG capsule  Take 20 mg by mouth 2 (two) times daily before a meal.     [provider]  oxyCODONE  (OXY IR/ROXICODONE ) 5 MG immediate release tablet Take 1-2 tablets (5-10 mg total) by mouth every 4 (four) hours as needed for moderate pain (pain score 4-6). 04/27/24   Amin, Ankit C, MD  polyethylene glycol (MIRALAX  / GLYCOLAX ) 17 g packet Take 17 g by mouth daily.    [provider]  pregabalin  (LYRICA ) 75 MG capsule Take 1 capsule (75 mg total) by mouth 2 (two) times daily. 02/27/24   Patsy Lenis, MD  tamsulosin  (FLOMAX ) 0.4 MG CAPS capsule Take 0.4 mg by mouth daily.    [provider]  telmisartan  (MICARDIS ) 40 MG tablet Take 40 mg by mouth daily.     [provider]  temazepam  (RESTORIL ) 15 MG capsule Take 1 capsule (15 mg total) by mouth at bedtime as needed for sleep. 04/27/24   Caleen Burgess BROCKS, MD    Allergies: Avelox [moxifloxacin], Quinolones, and Lexapro [escitalopram]    Review of Systems  All other systems reviewed and are negative.   Updated Vital Signs BP 126/71   Pulse 97   Temp 99.3 F (37.4 C) (Oral)   Resp 18   SpO2 94%   Physical Exam Vitals and nursing note reviewed.  Constitutional:      General: He is in acute distress.  HENT:     Mouth/Throat:     Mouth: Mucous membranes are moist.  Eyes:     Conjunctiva/sclera: Conjunctivae normal.  Cardiovascular:     Rate and Rhythm: Normal rate.  Pulmonary:     Effort: Pulmonary effort is normal.  Abdominal:     Palpations: Abdomen is soft.     Comments: Mild tenderness to palpation in the supra pelvic region  Genitourinary:    Comments: Foley in place with gross red blood at the meatus Skin:    General: Skin is warm.     Capillary Refill: Capillary refill takes less than 2 seconds.  Neurological:     Mental Status: He is alert and oriented to person, place, and time.     (all labs ordered are listed, but only abnormal results are displayed) Labs Reviewed  CBC WITH DIFFERENTIAL/PLATELET - Abnormal; Notable for the following components:      Result Value   WBC 15.5 (*)    Hemoglobin 12.8 (*)    Neutro Abs 13.8 (*)    Eosinophils Absolute 0.7 (*)    All other components within normal limits  URINALYSIS, ROUTINE W REFLEX MICROSCOPIC - Abnormal; Notable for the following components:   Color, Urine AMBER (*)    APPearance CLOUDY (*)    Hgb urine dipstick LARGE (*)    Protein, ur 100 (*)    Leukocytes,Ua MODERATE (*)    Bacteria, UA RARE (*)    All other components within normal limits  BASIC METABOLIC PANEL WITH GFR - Abnormal; Notable for the following components:   Glucose, Bld 120 (*)    Calcium 8.8 (*)    All other components within normal  limits  URINE CULTURE    EKG: None  Radiology: US  Renal Result Date: 06/10/2024 CLINICAL DATA:  hematuria - urology recommended for evaluation EXAM: RENAL / URINARY TRACT ULTRASOUND COMPLETE COMPARISON:  None Available. FINDINGS: Right Kidney: Renal measurements: 10.9 x 6.2 x 4.6 cm = volume: 162 mL. Echogenicity within normal limits. No mass or hydronephrosis visualized. Left Kidney: Renal measurements: 11.3 x 5.6 x 4.7 = volume:  156 mL. Echogenicity within normal limits. No mass or hydronephrosis visualized. Urinary bladder: Appears normal for degree of bladder distention. Other: Prostate is prominent measuring up to 4.6 cm. IMPRESSION: Unremarkable renal ultrasound. Electronically Signed   By: Morgane  Naveau M.D.   On: 06/10/2024 18:56     Procedures   Medications Ordered in the ED  sodium chloride  irrigation 0.9 % 3,000 mL (3,000 mLs Irrigation New Bag/Given 06/10/24 1845)  HYDROmorphone  (DILAUDID ) injection 1 mg (1 mg Intravenous Given 06/10/24 1730)  HYDROmorphone  (DILAUDID ) injection 0.5 mg (0.5 mg Intravenous Given 06/10/24 1838)  ceFAZolin  (ANCEF ) IVPB 2g/100 mL premix (0 g Intravenous Stopped 06/10/24 2052)  lactated ringers  bolus 500 mL (500 mLs Intravenous New Bag/Given 06/10/24 2004)    Clinical Course as of 06/10/24 2107  Wed Jun 10, 2024  1723 Traumatic Foley placement this morning and has had gross blood output since then.  He has had some urinary output through the Foley as well.  Patient has a large amount of gross blood in his Foley bag.  There are blood clots in the Foley catheter. He has gross blood at his meatus on exam -order placed to proceed with three-way continuous bladder irrigation, however consult to urology placed as well to discuss if urethrogram imaging is warranted based on our physical exam [AL]  2103 BMP unremarkable [AL]    Clinical Course User Index [AL] Corinthia No, DO                                 Medical Decision Making 73 year old male  brought to the emergency department due to concerns about traumatic Foley placement. He does have gross red blood at the meatus and significant pain after this Foley placement.  There are clots within the Foley bag and Foley catheter. Urology, Dr. Mitchell was consulted regarding the presentation.  They came down to bedside to exchange the Foley for a three-way 22 [place] and performed manual irrigation without significant clots seen.  His renal ultrasound showed no obvious clot burden but identified the Foley balloon within the urethra.  The catheter is now draining urine blood appropriately. He does have a slight bump in his white blood cell count 15.5.  Heart rate has been in the 90s and he has been afebrile.  We did send a urinalysis with dedicated urine culture that shows evidence of a UTI.  We gave 2 g of Ancef  while he was here in the ED and plan on discharging him with cefdinir  per urology recommendations.  We did extend the treatment.  From 5 to 7 days due to his evidence of a UTI.  Hemoglobin 12.8 and BMP did not show evidence of renal dysfunction. Patient cleared to be discharged at this time.  He will need to return if he has any persistent fevers or persistent tachycardia.  We are treating him for a UTI and treating him after multiple GU manipulations today.  All questions answered and stable for discharge  Amount and/or Complexity of Data Reviewed Labs: ordered. Radiology: ordered.  Risk Prescription drug management.     Final diagnoses:  Displacement of Foley catheter, initial encounter (HCC)  Hematuria, unspecified type  Acute cystitis with hematuria    ED Discharge Orders          Ordered    cefdinir  (OMNICEF ) 300 MG capsule  Daily        06/10/24 1958  Lyrik Buresh, DO 06/10/24 2107

## 2024-06-10 NOTE — ED Notes (Signed)
 Requested CBI bag and fluids to be ordered by Diplomatic Services operational officer.

## 2024-06-10 NOTE — Consult Note (Signed)
 Urology Consult   Physician requesting consult: ED Jolynn Pack  Reason for consult: traumatic Foley placement   History of Present Illness: Douglas Price is a 73 y.o. with hx of depression, Parkinson's disease, DM 2, GERD, RLS, chronic sacral wound, insomnia, chronic urinary retention with Foley who comes to ED in setting of gross hematuria after outpatient catheter exchange.   Patient endorses pain after placement of catheter as well as some blood at the meatus after placement. Feels like his bladder is full.    Past Medical History:  Diagnosis Date   Allergic rhinitis due to pollen    Benign neoplasm of colon    Blisters with epidermal loss due to burn (second degree) of foot    Complication of anesthesia    pt woke up during saphenous vein surgery-had epidural   Constipation due to pain medication    Degeneration of lumbar or lumbosacral intervertebral disc    Diverticulosis of colon (without mention of hemorrhage)    DM (diabetes mellitus) (HCC)    Esophageal reflux    Hypertrophy of prostate with urinary obstruction and other lower urinary tract symptoms (LUTS)    Loss of weight    Neuromuscular disorder (HCC)    peripheral neuropathy   Obesity, unspecified    Pain in limb    Restless legs syndrome (RLS)    Sleep disturbance 03/24/2014   Spinal stenosis, unspecified region other than cervical    Tobacco use disorder    Unspecified disease of pericardium    Unspecified essential hypertension    Unspecified local infection of skin and subcutaneous tissue    Varices of other sites     Past Surgical History:  Procedure Laterality Date   BACK SURGERY     CATARACT EXTRACTION Bilateral    one in july and other in Aug.2023   COLONOSCOPY W/ POLYPECTOMY     CYST EXCISION Left 04/09/2022   Procedure: LEFT INDEX FINGER MUCOID CYST EXCISION;  Surgeon: Murrell Drivers, MD;  Location: [facility];  Service: Orthopedics;  Laterality: Left;  Bier block   CYST EXCISION  Left 05/02/2023   Procedure: LEFT INDEX FINGER MUCOID CYST EXCISION AND DISTAL INTERPHALANGEAL JOINT DEBRIDEMENT;  Surgeon: Murrell Drivers, MD;  Location: [facility];  Service: Orthopedics;  Laterality: Left;   LUMBAR LAMINECTOMY/DECOMPRESSION MICRODISCECTOMY Bilateral 09/08/2015   Procedure: Laminectomy and Foraminotomy - Lumbar two-lumbar three bilateral;  Surgeon: Alm GORMAN Molt, MD;  Location: [facility];  Service: Neurosurgery;  Laterality: Bilateral;   SAPHENOUS VEIN GRAFT RESECTION Right 09/24/1989   TENDON EXPLORATION Left 04/09/2022   Procedure: DEBRIDEMENT DISTAL INTERPHALANGEAL JOINT LEFT INDEX FINGER;  Surgeon: Murrell Drivers, MD;  Location: [facility];  Service: Orthopedics;  Laterality: Left;  Bier block   TONSILLECTOMY     TRIGGER FINGER RELEASE Left 05/02/2023   Procedure: LEFT RING FINGER TRIGGER RELEASE;  Surgeon: Murrell Drivers, MD;  Location: [facility];  Service: Orthopedics;  Laterality: Left;    Current Hospital Medications:  Home Meds:  No current facility-administered medications on file prior to encounter.   Current Outpatient Medications on File Prior to Encounter  Medication Sig Dispense Refill   acetaminophen  (TYLENOL ) 325 MG tablet Take 650 mg by mouth every 6 (six) hours as needed.     bisacodyl  (DULCOLAX) 5 MG EC tablet Take 2 tablets (10 mg total) by mouth daily as needed for moderate constipation.     buPROPion  (WELLBUTRIN  XL) 300 MG 24 hr tablet TAKE 1  TABLET(300 MG) BY MOUTH DAILY 30 tablet 0   carbidopa -levodopa  (SINEMET  IR) 25-100 MG tablet Take 1 tablet by mouth 3 (three) times daily.     clonazePAM  (KLONOPIN ) 0.5 MG tablet Take 1 tablet (0.5 mg total) by mouth 2 (two) times daily as needed for anxiety. 60 tablet 0   collagenase  (SANTYL ) 250 UNIT/GM ointment Apply topically daily.     DULoxetine  (CYMBALTA ) 60 MG capsule Take 120 mg by mouth daily.      dutasteride  (AVODART ) 0.5 MG capsule Take 0.5 mg by mouth daily.      feeding supplement (ENSURE PLUS HIGH PROTEIN) LIQD Take 237 mLs by mouth 2 (two) times daily between meals.     hydrocortisone  cream 1 % Apply topically 2 (two) times daily.     lithium  carbonate 150 MG capsule TAKE 1 CAPSULE(150 MG) BY MOUTH EVERY EVENING 30 capsule 0   liver oil-zinc  oxide (DESITIN) 40 % ointment Apply topically 2 (two) times daily.     loratadine (CLARITIN) 10 MG tablet Take 10 mg by mouth daily as needed for allergies, rhinitis or itching.     metFORMIN  (GLUCOPHAGE ) 500 MG tablet Take 1,000 mg by mouth 2 (two) times daily with a meal.     mirtazapine  (REMERON ) 15 MG tablet Take 15 mg by mouth at bedtime.     Multiple Vitamin (MULTIVITAMIN WITH MINERALS) TABS tablet Take 1 tablet by mouth daily.     nebivolol  (BYSTOLIC ) 2.5 MG tablet Take 2.5 mg by mouth daily.     nutrition supplement, JUVEN, (JUVEN) PACK Take 1 packet by mouth 2 (two) times daily between meals.     omeprazole (PRILOSEC) 20 MG capsule Take 20 mg by mouth 2 (two) times daily before a meal.      oxyCODONE  (OXY IR/ROXICODONE ) 5 MG immediate release tablet Take 1-2 tablets (5-10 mg total) by mouth every 4 (four) hours as needed for moderate pain (pain score 4-6). 30 tablet 0   polyethylene glycol (MIRALAX  / GLYCOLAX ) 17 g packet Take 17 g by mouth daily.     pregabalin  (LYRICA ) 75 MG capsule Take 1 capsule (75 mg total) by mouth 2 (two) times daily. 20 capsule 0   tamsulosin  (FLOMAX ) 0.4 MG CAPS capsule Take 0.4 mg by mouth daily.     telmisartan  (MICARDIS ) 40 MG tablet Take 40 mg by mouth daily.     temazepam  (RESTORIL ) 15 MG capsule Take 1 capsule (15 mg total) by mouth at bedtime as needed for sleep. 7 capsule 0     Scheduled Meds: Continuous Infusions:  sodium chloride  irrigation     PRN Meds:.  Allergies:  Allergies  Allergen Reactions   Avelox [Moxifloxacin] Other (See Comments)    Angioedema Fatigue   Quinolones Other (See Comments)    Angioedema   Lexapro [Escitalopram] Other (See  Comments)    unknown    Family History  Problem Relation Age of Onset   Heart failure Mother    Lung cancer Father    Neuropathy Brother    Hypertension Paternal Grandfather    Tuberculosis Maternal Uncle    Cancer - Colon Maternal Uncle     Social History:  reports that he has been smoking pipe. He has never used smokeless tobacco. He reports that he does not drink alcohol and does not use drugs.  ROS: A complete review of systems was performed.  All systems are negative except for pertinent findings as noted.  Physical Exam:  Vital signs in last 24 hours: Temp:  [  100.3 F (37.9 C)] 100.3 F (37.9 C) (09/17 1659) Pulse Rate:  [106] 106 (09/17 1659) Resp:  [25] 25 (09/17 1659) BP: (146)/(95) 146/95 (09/17 1659) SpO2:  [91 %-96 %] 91 % (09/17 1659) Constitutional:  Alert and oriented, No acute distress Cardiovascular: Regular rate  Respiratory: Normal respiratory effort GI: Abdomen is soft, nontender, nondistended, no abdominal masses GU: Foley catheter in place with clot seen around catheter tip.  Neurologic: Grossly intact, tremors noted in upper extremities  Psychiatric: Normal mood and affect  Laboratory Data:  Recent Labs    06/10/24 1737  WBC 15.5*  HGB 12.8*  HCT 41.0  PLT 309    No results for input(s): NA, K, CL, GLUCOSE, BUN, CALCIUM, CREATININE in the last 72 hours.  Invalid input(s): CO3   Results for orders placed or performed during the hospital encounter of 06/10/24 (from the past 24 hours)  CBC with Differential     Status: Abnormal   Collection Time: 06/10/24  5:37 PM  Result Value Ref Range   WBC 15.5 (H) 4.0 - 10.5 K/uL   RBC 4.66 4.22 - 5.81 MIL/uL   Hemoglobin 12.8 (L) 13.0 - 17.0 g/dL   HCT 58.9 60.9 - 47.9 %   MCV 88.0 80.0 - 100.0 fL   MCH 27.5 26.0 - 34.0 pg   MCHC 31.2 30.0 - 36.0 g/dL   RDW 84.4 88.4 - 84.4 %   Platelets 309 150 - 400 K/uL   nRBC 0.0 0.0 - 0.2 %   Neutrophils Relative % 91 %   Neutro Abs 13.8  (H) 1.7 - 7.7 K/uL   Lymphocytes Relative 4 %   Lymphs Abs 0.7 0.7 - 4.0 K/uL   Monocytes Relative 1 %   Monocytes Absolute 0.2 0.1 - 1.0 K/uL   Eosinophils Relative 4 %   Eosinophils Absolute 0.7 (H) 0.0 - 0.5 K/uL   Basophils Relative 0 %   Basophils Absolute 0.1 0.0 - 0.1 K/uL   Immature Granulocytes 0 %   Abs Immature Granulocytes 0.05 0.00 - 0.07 K/uL   No results found for this or any previous visit (from the past 240 hours).  Renal Function: No results for input(s): CREATININE in the last 168 hours. CrCl cannot be calculated (Unknown ideal weight.).  Radiologic Imaging: No results found.  I independently reviewed the above imaging studies.  Impression:  31M with hx of Parkinson's disease, DM 2, GERD, RLS, chronic sacral wound, insomnia, chronic urinary retention with Foley who presents after traumatic Foley placement.   On evaluation, patient is AF and tachycardic but non-toxic appearing. Labs notable for elevated WBC to 15.5. RBUS obtained that demonstrated no obvious clot burden but Foley balloon was seen in the urethra. No BL hydronephrosis.    At bedside, Foley catheter was exchanged for a 22Fr 3-way and manual irrigation was performed with no clot. Catheter draining orange/yellow urine. See Procedure Note for full details.   Recommendations:  - Obtain BMP to evaluate Cr. If elevated, would reach out to Medicine for evaluation.  - Obtain UA and dedicated urine culture. Follow-up to ensure appropriate coverage given elevated WBC.  - Give Ancef  2g x1 in setting of aggressive manual irrigation. Also recommend discharge with 5 days of Cefdinir  for empiric coverage.  - Continue Foley catheter. No need for CBI at this time.  - Can return to routine exchanges monthly. Recommend 18Fr Coude catheter given enlarged prostate.   Kenzie Thoreson N Maysun Meditz 06/10/2024, 6:08 PM

## 2024-06-10 NOTE — ED Notes (Signed)
 Report given to [facility] [place]. Transport called

## 2024-06-10 NOTE — Procedures (Cosign Needed Addendum)
 Pre-procedure diagnosis: difficult foley catheterization Post-procedure diagnosis: as above  Procedure performed: - Cystoscopic placement of complicated foley  Surgeon: Dr. Lyle Civil  Findings: Clot in urethra, no false passage. Enlarged prostate.    Drains: 22Fr 3-way catheter.   Indications: Patient with traumatic Foley catheter placement at outside facility. Unable to replace without direct visualization.   Procedure:  Gentials were prepped and draped in the routine sterile fashion. Lidocaine  jelly was then injected into the patient's urethra.   A flexible cystoscopy was gently passed into the urethra with clot noted. The urethra was evaluated and no false passages were seen. There was an enlarged prostate that was likely the source of difficult catheterization. The cystoscope passed easily into the bladder. Irrigation was stopped, and a Sensor wire was then passed through the cystoscope into the bladder. Then, a 22Fr catheter was passed over the wire into the bladder with return of orange-colored urine.   At this point, I performed some irrigation. No clot was noted and urine remained fairly clear. Ultimately, the bladder drained over 400ccs of urine.   Disposition:  - Give periop antibiotics x5 days given procedure.  - Continue Foley catheter exchanges monthly.  - Okay for discharge from Urologic perspective.  - Patient instructed to call [facility] if any issues arise (seen previously by Dr. Lovie).   Lyle GEANNIE Civil, MD Resident, PGY4 Department of Urology

## 2024-06-10 NOTE — ED Triage Notes (Signed)
 Pt bibems from [facility]. Foley cath was changed out tday aroun 0900. Staff noticed blood in the bag multiple hours later and then called [facility] a few hours later. Pt has blood on the catheter line and around the tubing. Hx of Parkinson's. No injury pt is aware of, only notes pain after placement of the catheter.

## 2024-06-11 LAB — URINE CULTURE: Culture: NO GROWTH

## 2024-06-12 ENCOUNTER — Telehealth: Payer: Self-pay

## 2024-06-12 ENCOUNTER — Ambulatory Visit: Admitting: Psychiatry

## 2024-06-12 DIAGNOSIS — Z789 Other specified health status: Secondary | ICD-10-CM | POA: Diagnosis not present

## 2024-06-12 DIAGNOSIS — F331 Major depressive disorder, recurrent, moderate: Secondary | ICD-10-CM | POA: Diagnosis not present

## 2024-06-12 DIAGNOSIS — M48062 Spinal stenosis, lumbar region with neurogenic claudication: Secondary | ICD-10-CM | POA: Diagnosis not present

## 2024-06-12 DIAGNOSIS — G20A1 Parkinson's disease without dyskinesia, without mention of fluctuations: Secondary | ICD-10-CM | POA: Diagnosis not present

## 2024-06-12 NOTE — Telephone Encounter (Signed)
 Patient has been reschedule to 10/2 with Dr. Dennise due to transportation. Patient is no longer at [facility] and if lab orders need to be sent before appointment please [employer] of nursing staff at [facility] [phone].

## 2024-06-15 NOTE — Telephone Encounter (Signed)
 Sent message to provider via secure chat if any labs needed prior to visit.  Enis Kleine, LPN

## 2024-06-17 NOTE — Telephone Encounter (Signed)
 Received call from patient's brother, Octaviano. States Zell is now at [facility] and would like any lab orders sent to them prior to patient's 10/2 appointment. He also wanted provider to know that patient was in the ED 9/17 so there are some labs from that encounter.   Second secure chat sent to provider to advise.   Salvador Bigbee, BSN, RN

## 2024-06-17 NOTE — Telephone Encounter (Signed)
 Per Dr. Dennise: yes, cbc, bmp esr and crp closer to visit   Orders faxed to [facility] [phone] per facility receptionist). Requested that labs be drawn 9/29 and results faxed to triage prior to 10/2 appt.  Sharrod Achille, BSN, RN

## 2024-06-18 ENCOUNTER — Ambulatory Visit: Admitting: Internal Medicine

## 2024-06-23 NOTE — Progress Notes (Unsigned)
 Assessment/Plan:   1.  Parkinsonism  -His levodopa  was restarted in the hospital and they initially thought it helpful but now not so sure.  -levodopa  challenge previously did not demonstrate benefit to levodopa  at 250 mg  -we discussed that he certainly may have CBGD.  Discussed they atypical states and how they differ from typical Parkinsons Disease.  He asked several questions and I answered them to best of my ability today.  Discussed skin bx but didn't necessarily recommend since wouldn't change tx.    -discussed prognosis with atypical states  -discussed extensively advanced directives.  He has POA for healthcare and finances but not other advanced directives and encouraged them to get those in place.    -discussed that lithium  can increase tremor in 2/3 of pts exposed but didn't recommend changing that; just noted for informational purposes  -he mentioned the new things for Parkinsons Disease and discussed quickly vyalev/onapgo but those are for idiopathic Parkinsons Disease and I don't see those helping him, esp since vyalev is just levodopa   2.  Depression     -seems to be doing well and [facility]  -discussed family support and he may want to move closer to family but there are implications for his medicaid (he is in the spend-down period)   3.  Probable diabetic neuropathy             -Likely contributes to gait instability  4.  F/u prn   Subjective:   Elsie VEAR Cave was last seen over a year ago, at which point we did a levodopa  challenge test.  We did not find used levodopa .  We discussed extensively doing a skin biopsy for alpha-synuclein, but after repeated discussion, the patient ultimately decided not to do that.  Patient has recently been in the hospital several times.  He was in the hospital May/June 2025 after being found at home on the ground, soiled in urine.  He was severely septic, presenting with fever, tachycardia, leukocytosis and was treated  for aspiration pneumonia.  Patient was presumed to have been down for several days.  While he was in the hospital in May, 2025/June 2025, they placed him on levodopa .  Of note was the fact that he was on lithium  in addition to his other medications for mood.  He was ultimately discharged from the hospital to subacute nursing facility.  He was discharged on carbidopa /levodopa  25/100, 1 tablet 3 times per day.  He was back in the emergency room a few weeks later with a sacral wound, but was not admitted.  About a month after that, on July 31 the patient presented again to the hospital with fever and sepsis, felt due to sacral wound infection versus urinary tract infection.  Ultimately, he was treated for sacral cellulitis/osteomyelitis.  Pt with brother today who supplements hx.  Pt is still on the carbidopa /levodopa  that was restarted back in July.  He is at [facility] now.  He is doing PT/OT/ST.  When at [facility] he was up to walking 350 ft but now he is at longterm care and there is less rehab.  He hasn't walked with PT at [facility] yet but he has only been there for 10 days yet.  He has occ visual distortions of rats in the periphery but he goes to look and they are gone.    Current prescribed movement disorder medications:  Carbidopa /levodopa  25/100, 1 tablet 3 times per day at 7 AM/11 AM/4 PM (not taking)  Wellbutrin  XL, 300 mg daily (restarted in April)  PREVIOUS MEDICATIONS: PREVIOUS MEDICATIONS: amantadine (no help for tremor); ropinirole  - 1 mg bid (on for restless leg); rytary  -some help but too expensive.; carbidopa /levodopa  25/100; Lexapro (made him jittery and reports as allergy); pramipexole    ALLERGIES:   Allergies  Allergen Reactions   Avelox [Moxifloxacin] Other (See Comments)    Angioedema Fatigue   Quinolones Other (See Comments)    Angioedema   Lexapro [Escitalopram] Other (See Comments)    unknown    CURRENT MEDICATIONS:  Current Meds  Medication Sig   acetaminophen   (TYLENOL ) 325 MG tablet Take 650 mg by mouth every 6 (six) hours as needed.   bisacodyl  (DULCOLAX) 5 MG EC tablet Take 2 tablets (10 mg total) by mouth daily as needed for moderate constipation.   buPROPion  (WELLBUTRIN  XL) 300 MG 24 hr tablet TAKE 1 TABLET(300 MG) BY MOUTH DAILY   carbidopa -levodopa  (SINEMET  IR) 25-100 MG tablet Take 1 tablet by mouth 3 (three) times daily.   clonazePAM  (KLONOPIN ) 0.5 MG tablet Take 1 tablet (0.5 mg total) by mouth 2 (two) times daily as needed for anxiety.   collagenase  (SANTYL ) 250 UNIT/GM ointment Apply topically daily.   DULoxetine  (CYMBALTA ) 60 MG capsule Take 120 mg by mouth daily.    dutasteride  (AVODART ) 0.5 MG capsule Take 0.5 mg by mouth daily.   feeding supplement (ENSURE PLUS HIGH PROTEIN) LIQD Take 237 mLs by mouth 2 (two) times daily between meals.   hydrocortisone  cream 1 % Apply topically 2 (two) times daily.   lithium  carbonate 150 MG capsule TAKE 1 CAPSULE(150 MG) BY MOUTH EVERY EVENING   liver oil-zinc  oxide (DESITIN) 40 % ointment Apply topically 2 (two) times daily.   loratadine (CLARITIN) 10 MG tablet Take 10 mg by mouth daily as needed for allergies, rhinitis or itching.   magnesium  hydroxide (MILK OF MAGNESIA) 400 MG/5ML suspension Take 30 mLs by mouth daily as needed for mild constipation.   metFORMIN  (GLUCOPHAGE ) 500 MG tablet Take 1,000 mg by mouth 2 (two) times daily with a meal.   mirtazapine  (REMERON ) 15 MG tablet Take 15 mg by mouth at bedtime.   Multiple Vitamin (MULTIVITAMIN WITH MINERALS) TABS tablet Take 1 tablet by mouth daily.   nebivolol  (BYSTOLIC ) 2.5 MG tablet Take 2.5 mg by mouth daily.   nutrition supplement, JUVEN, (JUVEN) PACK Take 1 packet by mouth 2 (two) times daily between meals.   omeprazole (PRILOSEC) 20 MG capsule Take 20 mg by mouth 2 (two) times daily before a meal.    polyethylene glycol (MIRALAX  / GLYCOLAX ) 17 g packet Take 17 g by mouth daily.   pregabalin  (LYRICA ) 75 MG capsule Take 1 capsule (75 mg total)  by mouth 2 (two) times daily.   SODIUM PHOSPHATES RE Place rectally.   tamsulosin  (FLOMAX ) 0.4 MG CAPS capsule Take 0.4 mg by mouth daily.   telmisartan  (MICARDIS ) 40 MG tablet Take 40 mg by mouth daily.     Objective:   PHYSICAL EXAMINATION:    VITALS:   Vitals:   06/25/24 1144  BP: 93/61  Pulse: 73  Resp: 20  SpO2: 95%  Weight: 186 lb (84.4 kg)   HEENT:  [place]/at.  MMM CV:  RRR Lungs:  CTAB Neck:  no bruits   Neurological examination:  Movement examination: Tone: There is mod to severe increased tone in the RUE and LE.  This is similar to last time Abnormal movements: there is RUE rest tremor that increases with distraction.  This is  mild to mod and similar to last examination Coordination:  There is mild to mod decremation with RAM's, R>L.  He has some orthopedic issues in the right hand that make hand opening and closing impossible (trigger finger) Gait and Station: not tested as currently not even walking with PT  I have reviewed and interpreted the following labs independently    Chemistry      Component Value Date/Time   NA 137 06/10/2024 2017   K 4.6 06/10/2024 2017   CL 102 06/10/2024 2017   CO2 24 06/10/2024 2017   BUN 22 06/10/2024 2017   CREATININE 0.98 06/10/2024 2017   CREATININE 0.78 05/21/2024 1038      Component Value Date/Time   CALCIUM 8.8 (L) 06/10/2024 2017   ALKPHOS 65 04/23/2024 1532   AST 18 05/21/2024 1038   ALT 16 05/21/2024 1038   BILITOT 0.4 05/21/2024 1038       Lab Results  Component Value Date   WBC 15.5 (H) 06/10/2024   HGB 12.8 (L) 06/10/2024   HCT 41.0 06/10/2024   MCV 88.0 06/10/2024   PLT 309 06/10/2024    Lab Results  Component Value Date   TSH 0.712 05/24/2010     Total time spent on today's visit was 65 minutes, including both face-to-face time and nonface-to-face time.  Time included that spent on review of records (prior notes available to me/labs/imaging if pertinent), discussing treatment and goals,  answering patient's questions and coordinating care.  This did not include the wait time for levodopa  to kick in  Cc:  [name], [name], MD

## 2024-06-25 ENCOUNTER — Ambulatory Visit (INDEPENDENT_AMBULATORY_CARE_PROVIDER_SITE_OTHER): Admitting: Internal Medicine

## 2024-06-25 ENCOUNTER — Ambulatory Visit (INDEPENDENT_AMBULATORY_CARE_PROVIDER_SITE_OTHER): Admitting: Neurology

## 2024-06-25 ENCOUNTER — Encounter: Payer: Self-pay | Admitting: Neurology

## 2024-06-25 ENCOUNTER — Other Ambulatory Visit: Payer: Self-pay

## 2024-06-25 VITALS — BP 93/61 | HR 73 | Resp 20 | Wt 186.0 lb

## 2024-06-25 VITALS — BP 106/75 | HR 72 | Temp 98.4°F

## 2024-06-25 DIAGNOSIS — G3185 Corticobasal degeneration: Secondary | ICD-10-CM | POA: Insufficient documentation

## 2024-06-25 DIAGNOSIS — M869 Osteomyelitis, unspecified: Secondary | ICD-10-CM

## 2024-06-25 NOTE — Progress Notes (Signed)
 Patient: Douglas Price  DOB: 1951/03/12 MRN: [id number] PCP: Janey Santos, MD  Patient Active Problem List   Diagnosis Date Noted   Acute urinary retention 02/18/2024   Polypharmacy 02/15/2024   Hypokalemia 02/15/2024   Multiple wounds of skin 02/14/2024   Sepsis due to cellulitis (HCC) 02/14/2024   Transaminitis 02/14/2024   Fall at home, initial encounter 02/14/2024   Gait disturbance 02/14/2024   Depression 02/14/2024   Insomnia 02/14/2024   BPH (benign prostatic hyperplasia) 02/14/2024   GERD (gastroesophageal reflux disease) 02/14/2024   Pain due to onychomycosis of toenails of both feet 01/31/2023   Parkinson's disease (HCC) 11/25/2017   Diabetic polyneuropathy associated with type 2 diabetes mellitus (HCC) 11/25/2017   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   S/P lumbar spinal fusion 03/14/2016   S/P lumbar laminectomy 09/08/2015   Sleep disturbance 03/24/2014     Subjective:  Douglas Price is a 73 y.o. -year-old male with past medical history of Parkinson's disease, chronic Foley, GERD, RLS, diabetes type 2 presents for hospital follow-up of Proteus Mirabella's bacteremia in the setting of stage IV decubitus ulcer.  Source of bacteremia was likely sacral ulcer.  Necrotic eschar on ulcer probably could benefit from wound care noted.  General surgery recommended local wound care and pressure displacement otherwise no surgical I&D recommended. - Patient was discharged onDoxycycline and Augmentin  x 6 weeks EOT 9/11 Today : - No concern for surrounding infection/cellulitis  Wound was open.  Brother present during visit. Review of Systems  All other systems reviewed and are negative.   Past Medical History:  Diagnosis Date   Allergic rhinitis due to pollen    Benign neoplasm of colon    Blisters with epidermal loss due to burn (second degree) of foot    Complication of anesthesia    pt woke up during saphenous vein  surgery-had epidural   Constipation due to pain medication    Degeneration of lumbar or lumbosacral intervertebral disc    Diverticulosis of colon (without mention of hemorrhage)    DM (diabetes mellitus) (HCC)    Esophageal reflux    Hypertrophy of prostate with urinary obstruction and other lower urinary tract symptoms (LUTS)    Loss of weight    Neuromuscular disorder (HCC)    peripheral neuropathy   Obesity, unspecified    Pain in limb    Restless legs syndrome (RLS)    Sleep disturbance 03/24/2014   Spinal stenosis, unspecified region other than cervical    Tobacco use disorder    Unspecified disease of pericardium    Unspecified essential hypertension    Unspecified local infection of skin and subcutaneous tissue    Varices of other sites     Outpatient Medications Prior to Visit  Medication Sig Dispense Refill   acetaminophen  (TYLENOL ) 325 MG tablet Take 650 mg by mouth every 6 (six) hours as needed.     bisacodyl  (DULCOLAX) 5 MG EC tablet Take 2 tablets (10 mg total) by mouth daily as needed for moderate constipation.     buPROPion  (WELLBUTRIN  XL) 300 MG 24 hr tablet TAKE 1 TABLET(300 MG) BY MOUTH DAILY 30 tablet 0   carbidopa -levodopa  (SINEMET  IR) 25-100 MG tablet Take 1 tablet by mouth 3 (three) times daily.     clonazePAM  (KLONOPIN ) 0.5 MG tablet Take 1 tablet (0.5 mg total) by mouth 2 (two) times daily as needed for anxiety. 60 tablet 0   collagenase  (SANTYL ) 250 UNIT/GM ointment Apply topically  daily.     DULoxetine  (CYMBALTA ) 60 MG capsule Take 120 mg by mouth daily.      dutasteride  (AVODART ) 0.5 MG capsule Take 0.5 mg by mouth daily.     feeding supplement (ENSURE PLUS HIGH PROTEIN) LIQD Take 237 mLs by mouth 2 (two) times daily between meals.     hydrocortisone  cream 1 % Apply topically 2 (two) times daily.     lithium  carbonate 150 MG capsule TAKE 1 CAPSULE(150 MG) BY MOUTH EVERY EVENING 30 capsule 0   liver oil-zinc  oxide (DESITIN) 40 % ointment Apply topically 2  (two) times daily.     loratadine (CLARITIN) 10 MG tablet Take 10 mg by mouth daily as needed for allergies, rhinitis or itching.     metFORMIN  (GLUCOPHAGE ) 500 MG tablet Take 1,000 mg by mouth 2 (two) times daily with a meal.     mirtazapine  (REMERON ) 15 MG tablet Take 15 mg by mouth at bedtime.     Multiple Vitamin (MULTIVITAMIN WITH MINERALS) TABS tablet Take 1 tablet by mouth daily.     nebivolol  (BYSTOLIC ) 2.5 MG tablet Take 2.5 mg by mouth daily.     nutrition supplement, JUVEN, (JUVEN) PACK Take 1 packet by mouth 2 (two) times daily between meals.     omeprazole (PRILOSEC) 20 MG capsule Take 20 mg by mouth 2 (two) times daily before a meal.      oxyCODONE  (OXY IR/ROXICODONE ) 5 MG immediate release tablet Take 1-2 tablets (5-10 mg total) by mouth every 4 (four) hours as needed for moderate pain (pain score 4-6). 30 tablet 0   polyethylene glycol (MIRALAX  / GLYCOLAX ) 17 g packet Take 17 g by mouth daily.     pregabalin  (LYRICA ) 75 MG capsule Take 1 capsule (75 mg total) by mouth 2 (two) times daily. 20 capsule 0   tamsulosin  (FLOMAX ) 0.4 MG CAPS capsule Take 0.4 mg by mouth daily.     telmisartan  (MICARDIS ) 40 MG tablet Take 40 mg by mouth daily.     temazepam  (RESTORIL ) 15 MG capsule Take 1 capsule (15 mg total) by mouth at bedtime as needed for sleep. 7 capsule 0   No facility-administered medications prior to visit.     Allergies  Allergen Reactions   Avelox [Moxifloxacin] Other (See Comments)    Angioedema Fatigue   Quinolones Other (See Comments)    Angioedema   Lexapro [Escitalopram] Other (See Comments)    unknown    Social History   Tobacco Use   Smoking status: Some Days    Types: Pipe   Smokeless tobacco: Never   Tobacco comments:    moderate, 1-2bowls a day or more  Vaping Use   Vaping status: Never Used  Substance Use Topics   Alcohol use: No    Alcohol/week: 0.0 standard drinks of alcohol   Drug use: No    Family History  Problem Relation Age of Onset    Heart failure Mother    Lung cancer Father    Neuropathy Brother    Hypertension Paternal Grandfather    Tuberculosis Maternal Uncle    Cancer - Colon Maternal Uncle     Objective:  There were no vitals filed for this visit. There is no height or weight on file to calculate BMI.  Physical Exam Constitutional:      General: He is not in acute distress.    Appearance: He is normal weight. He is not toxic-appearing.  HENT:     Head: Normocephalic and atraumatic.  Right Ear: External ear normal.     Left Ear: External ear normal.     Nose: No congestion or rhinorrhea.     Mouth/Throat:     Mouth: Mucous membranes are moist.     Pharynx: Oropharynx is clear.  Eyes:     Extraocular Movements: Extraocular movements intact.     Conjunctiva/sclera: Conjunctivae normal.     Pupils: Pupils are equal, round, and reactive to light.  Cardiovascular:     Rate and Rhythm: Normal rate and regular rhythm.     Heart sounds: No murmur heard.    No friction rub. No gallop.  Pulmonary:     Effort: Pulmonary effort is normal.     Breath sounds: Normal breath sounds.  Abdominal:     General: Abdomen is flat. Bowel sounds are normal.     Palpations: Abdomen is soft.  Musculoskeletal:        General: No swelling.     Cervical back: Normal range of motion and neck supple.  Skin:    General: Skin is warm and dry.  Neurological:     General: No focal deficit present.     Mental Status: He is oriented to person, place, and time.  Psychiatric:        Mood and Affect: Mood normal.     Lab Results: Lab Results  Component Value Date   WBC 15.5 (H) 06/10/2024   HGB 12.8 (L) 06/10/2024   HCT 41.0 06/10/2024   MCV 88.0 06/10/2024   PLT 309 06/10/2024    Lab Results  Component Value Date   CREATININE 0.98 06/10/2024   BUN 22 06/10/2024   NA 137 06/10/2024   K 4.6 06/10/2024   CL 102 06/10/2024   CO2 24 06/10/2024    Lab Results  Component Value Date   ALT 16 05/21/2024   AST  18 05/21/2024   ALKPHOS 65 04/23/2024   BILITOT 0.4 05/21/2024        Assessment & Plan:  #Proteus mirabilis bacteremia secondary to sacral decubitus ulcer - Blood cultures on 7/31 grew 1 out of 2 Proteus mirabilis - 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 - General Surgery was consulted and recommended local wound care and pressure offloading. - Complete doxycycline  and Augmentin  x 2 weeks weeks EOT 9/11  -Reviewed labs off antibiotics on 9/29: WBC 10.9 K, serum creatinine 0.67, CRP 0.26, ESR 34. No concern for acute infection today.  - Discussed with patient to continue aggressive wound care, if able recommend ambulation to pressure offload for optimal wound healing(pt able to stand at visit with assitance).  If there are concern for underlying infection please call clinic. - Follow with PRN  Loney Stank, MD [facility]   06/25/24  11:27 AM   I have personally spent 56 minutes involved in face-to-face and non-face-to-face activities for this patient on the day of the visit. Professional time spent includes the following activities: Preparing to see the patient (review of tests), Obtaining and/or reviewing separately obtained history (admission/discharge record), Performing a medically appropriate examination and/or evaluation , Ordering medications/tests/procedures, referring and communicating with other health care professionals, Documenting clinical information in the EMR, Independently interpreting results (not separately reported), Communicating results to the patient/family/caregiver, Counseling and educating the patient/family/caregiver and Care coordination (not separately reported).

## 2024-06-25 NOTE — Patient Instructions (Signed)
 I gave you a copy of the [place] MOST form to look at and discuss I gave you some information on CBGD It was good to see you!  Join us  on social media and stay up to date with what is going on in the [place] regarding Parkinson's Disease and Movement Disorders!

## 2024-06-25 NOTE — Patient Instructions (Addendum)
-   Continue aggressive wound care - If able would recommend ambulation to take pressure off wound for optimal wound healing - If concern for overlying infection please call clinic.  Stable labs off of antibiotics. - Follow-up with ID.

## 2024-06-30 ENCOUNTER — Encounter (HOSPITAL_COMMUNITY): Payer: Self-pay

## 2024-06-30 ENCOUNTER — Other Ambulatory Visit: Payer: Self-pay

## 2024-06-30 ENCOUNTER — Emergency Department (HOSPITAL_COMMUNITY)
Admission: EM | Admit: 2024-06-30 | Discharge: 2024-06-30 | Disposition: A | Discharge status: Home / Self Care | Attending: Emergency Medicine | Admitting: Emergency Medicine

## 2024-06-30 ENCOUNTER — Emergency Department (HOSPITAL_COMMUNITY)

## 2024-06-30 DIAGNOSIS — E119 Type 2 diabetes mellitus without complications: Secondary | ICD-10-CM | POA: Diagnosis not present

## 2024-06-30 DIAGNOSIS — S0990XA Unspecified injury of head, initial encounter: Secondary | ICD-10-CM | POA: Diagnosis not present

## 2024-06-30 DIAGNOSIS — I10 Essential (primary) hypertension: Secondary | ICD-10-CM | POA: Insufficient documentation

## 2024-06-30 DIAGNOSIS — S12501A Unspecified nondisplaced fracture of sixth cervical vertebra, initial encounter for closed fracture: Secondary | ICD-10-CM | POA: Insufficient documentation

## 2024-06-30 DIAGNOSIS — S0993XA Unspecified injury of face, initial encounter: Secondary | ICD-10-CM | POA: Diagnosis not present

## 2024-06-30 DIAGNOSIS — F17200 Nicotine dependence, unspecified, uncomplicated: Secondary | ICD-10-CM | POA: Insufficient documentation

## 2024-06-30 DIAGNOSIS — W06XXXA Fall from bed, initial encounter: Secondary | ICD-10-CM | POA: Insufficient documentation

## 2024-06-30 DIAGNOSIS — S199XXA Unspecified injury of neck, initial encounter: Secondary | ICD-10-CM | POA: Diagnosis present

## 2024-06-30 DIAGNOSIS — Z85038 Personal history of other malignant neoplasm of large intestine: Secondary | ICD-10-CM | POA: Insufficient documentation

## 2024-06-30 MED ORDER — OXYCODONE HCL 5 MG PO TABS
5.0000 mg | ORAL_TABLET | Freq: Once | ORAL | Status: AC
  Administered 2024-06-30: 5 mg via ORAL
  Filled 2024-06-30: qty 1

## 2024-06-30 MED ORDER — BUPIVACAINE HCL (PF) 0.25 % IJ SOLN
50.0000 mL | Freq: Once | INTRAMUSCULAR | Status: DC
  Filled 2024-06-30: qty 60

## 2024-06-30 NOTE — ED Provider Notes (Signed)
 [facility] Emergency Department Provider Note MRN:  [id number]  Arrival date & time: 06/30/24     Chief Complaint   Fall   History of Present Illness   Douglas Price is a 73 y.o. year-old male with a history of diabetes presenting to the ED with chief complaint of fall.  Accidentally rolled off the bed.  Struck his face against the nightstand.  Thinks he lost some teeth.  Endorsing headache, tooth and mouth pain, neck pain.  Denies back pain, no chest pain or shortness of breath, no abdominal pain, no injuries to the arms or legs.  Review of Systems  A thorough review of systems was obtained and all systems are negative except as noted in the HPI and PMH.   Patient's Health History    Past Medical History:  Diagnosis Date   Allergic rhinitis due to pollen    Benign neoplasm of colon    Blisters with epidermal loss due to burn (second degree) of foot    Complication of anesthesia    pt woke up during saphenous vein surgery-had epidural   Constipation due to pain medication    Degeneration of lumbar or lumbosacral intervertebral disc    Diverticulosis of colon (without mention of hemorrhage)    DM (diabetes mellitus) (HCC)    Esophageal reflux    Hypertrophy of prostate with urinary obstruction and other lower urinary tract symptoms (LUTS)    Loss of weight    Neuromuscular disorder (HCC)    peripheral neuropathy   Obesity, unspecified    Pain in limb    Restless legs syndrome (RLS)    Sleep disturbance 03/24/2014   Spinal stenosis, unspecified region other than cervical    Tobacco use disorder    Unspecified disease of pericardium    Unspecified essential hypertension    Unspecified local infection of skin and subcutaneous tissue    Varices of other sites     Past Surgical History:  Procedure Laterality Date   BACK SURGERY     CATARACT EXTRACTION Bilateral    one in july and other in Aug.2023   COLONOSCOPY W/ POLYPECTOMY     CYST  EXCISION Left 04/09/2022   Procedure: LEFT INDEX FINGER MUCOID CYST EXCISION;  Surgeon: Murrell Drivers, MD;  Location: [facility];  Service: Orthopedics;  Laterality: Left;  Bier block   CYST EXCISION Left 05/02/2023   Procedure: LEFT INDEX FINGER MUCOID CYST EXCISION AND DISTAL INTERPHALANGEAL JOINT DEBRIDEMENT;  Surgeon: Murrell Drivers, MD;  Location: [facility];  Service: Orthopedics;  Laterality: Left;   LUMBAR LAMINECTOMY/DECOMPRESSION MICRODISCECTOMY Bilateral 09/08/2015   Procedure: Laminectomy and Foraminotomy - Lumbar two-lumbar three bilateral;  Surgeon: Alm GORMAN Molt, MD;  [facility];  Service: Neurosurgery;  Laterality: Bilateral;   SAPHENOUS VEIN GRAFT RESECTION Right 09/24/1989   TENDON EXPLORATION Left 04/09/2022   Procedure: DEBRIDEMENT DISTAL INTERPHALANGEAL JOINT LEFT INDEX FINGER;  Surgeon: Murrell Drivers, MD;  Location: [facility];  Service: Orthopedics;  Laterality: Left;  Bier block   TONSILLECTOMY     TRIGGER FINGER RELEASE Left 05/02/2023   Procedure: LEFT RING FINGER TRIGGER RELEASE;  Surgeon: Murrell Drivers, MD;  Location: [facility];  Service: Orthopedics;  Laterality: Left;    Family History  Problem Relation Age of Onset   Heart failure Mother    Lung cancer Father    Neuropathy Brother    Hypertension Paternal Grandfather    Tuberculosis Maternal Uncle  Cancer - Colon Maternal Uncle     Social History   Socioeconomic History   Marital status: Single    Spouse name: Not on file   Number of children: 0   Years of education: BA   Highest education level: Not on file  Occupational History    Employer: [employer]    Comment: Schools   Occupation: TEACHER    Employer: [employer]   Occupation: Geologist, engineering    Comment: [employer]  Tobacco Use   Smoking status: Some Days    Types: Pipe   Smokeless tobacco: Never   Tobacco comments:    moderate, 1-2bowls a day or  more  Vaping Use   Vaping status: Never Used  Substance and Sexual Activity   Alcohol use: No    Alcohol/week: 0.0 standard drinks of alcohol   Drug use: No   Sexual activity: Not on file  Other Topics Concern   Not on file  Social History Narrative   Grew up in [place], graduated from [employer] in History. Worked for [employer]. He had a homeschool, or substituted.    Likes to read.    Patient lives at home alone. Sister and brother help with household things as needed.       Caffeine Use: 1/2-1 of coffee/tea daily   Right handed   Religion-anglican, Sherlean, grew up [employer]: Low Risk  (02/07/2023)   Overall Financial Resource Strain (CARDIA)    Difficulty of Paying Living Expenses: Not hard at all  Food Insecurity: No Food Insecurity (02/16/2024)   Hunger Vital Sign    Worried About Running Out of Food in the Last Year: Never true    Ran Out of Food in the Last Year: Never true  Transportation Needs: No Transportation Needs (02/16/2024)   PRAPARE - Administrator, Civil Service (Medical): No    Lack of Transportation (Non-Medical): No  Physical Activity: Not on file  Stress: Not on file  Social Connections: Moderately Isolated (02/16/2024)   Social Connection and Isolation Panel    Frequency of Communication with Friends and Family: Three times a week    Frequency of Social Gatherings with Friends and Family: Once a week    Attends Religious Services: More than 4 times per year    Active Member of [employer] or Organizations: No    Attends Banker Meetings: Never    Marital Status: Never married  Intimate Partner Violence: Not At Risk (02/16/2024)   Humiliation, Afraid, Rape, and Kick questionnaire    Fear of Current or Ex-Partner: No    Emotionally Abused: No    Physically Abused: No    Sexually Abused: No     Physical Exam   Vitals:   06/30/24 0244 06/30/24 0252   BP:  129/75  Pulse:  73  Resp:  20  Temp:  97.8 F (36.6 C)  SpO2: 99% 100%    CONSTITUTIONAL: Well-appearing, NAD NEURO/PSYCH:  Alert and oriented x 3, no focal deficits EYES:  eyes equal and reactive ENT/NECK:  no LAD, no JVD CARDIO: Regular rate, well-perfused, normal S1 and S2 PULM:  CTAB no wheezing or rhonchi GI/GU:  non-distended, non-tender MSK/SPINE:  No gross deformities, no edema SKIN:  no rash, atraumatic   *Additional and/or pertinent findings included in MDM below  Diagnostic and Interventional Summary    EKG Interpretation Date/Time:  Ventricular Rate:    PR Interval:    QRS Duration:    QT Interval:    QTC Calculation:   R Axis:      Text Interpretation:         Labs Reviewed - No data to display  CT HEAD WO CONTRAST ( )  Final Result    CT CERVICAL SPINE WO CONTRAST  Final Result    CT MAXILLOFACIAL WO CONTRAST  Final Result      Medications  bupivacaine  (PF) (MARCAINE ) 0.25 % injection 50 mL (has no administration in time range)     Procedures  /  Critical Care Procedures  ED Course and Medical Decision Making  Initial Impression and Ddx Patient appears to have fracture to tooth numbers 910 and 11, possibly class III Ellis fractures.  This will require prompt follow-up with a dentist.  Other differential diagnoses include intracranial bleeding, cervical spinal fracture, facial bone fractures.  Abdomen soft, lungs clear and present, not having any other complaints or signs of trauma.  Past medical/surgical history that increases complexity of ED encounter: Lumbar stenosis  Interpretation of Diagnostics I personally reviewed the CT imaging and my interpretation is as follows: C6 transverse process fracture, no other significant findings.    Patient Reassessment and Ultimate Disposition/Management     Patient doing well on reassessment, not really having much tooth pain, no bleeding from the teeth.  I have a suspicion that these  teeth had a fair amount of decay prior to the trauma.  Without bleeding or pain not much benefit doing dental cement.  Encouraged prompt dental follow-up, spinal expert follow-up.  Patient management required discussion with the following services or consulting groups:  None  Complexity of Problems Addressed Acute illness or injury that poses threat of life of bodily function  Additional Data Reviewed and Analyzed Further history obtained from: Prior labs/imaging results  Additional Factors Impacting ED Encounter Risk Consideration of hospitalization  Ozell HERO. Theadore, MD [facility] [email or web address]  Final Clinical Impressions(s) / ED Diagnoses     ICD-10-CM   1. Closed nondisplaced fracture of sixth cervical vertebra, unspecified fracture morphology, initial encounter (HCC)  S12.501A     2. Dental injury, initial encounter  S09.93XA       ED Discharge Orders     None        Discharge Instructions Discussed with and Provided to Patient:     Discharge Instructions      You were evaluated in the Emergency Department and after careful evaluation, we did not find any emergent condition requiring admission or further testing in the hospital.  Your exam/testing today is overall reassuring.  You have a small break in your neck bone.  Recommend follow-up with the spine experts.  Use the collar for comfort.  Use Tylenol  or Motrin  at home for discomfort.  Also recommend follow-up with your dentist within the next 48 hours.  Please return to the Emergency Department if you experience any worsening of your condition.   Thank you for allowing us  to be a part of your care.       Theadore Ozell HERO, MD 06/30/24 [phone]

## 2024-06-30 NOTE — Discharge Instructions (Addendum)
 You were evaluated in the Emergency Department and after careful evaluation, we did not find any emergent condition requiring admission or further testing in the hospital.  Your exam/testing today is overall reassuring.  You have a small break in your neck bone.  Recommend follow-up with the spine experts.  Use the collar for comfort.  Use Tylenol  or Motrin  at home for discomfort.  Also recommend follow-up with your dentist within the next 48 hours.  Please return to the Emergency Department if you experience any worsening of your condition.   Thank you for allowing us  to be a part of your care.

## 2024-06-30 NOTE — ED Triage Notes (Signed)
 BIBEMS from [facility] for fall out of bed about 1.5hr PTA. Pt complains of head, neck, and mouth pain. 3 teeth knocked out from fall. -LOC, - thinners. Pt states he was dreaming and rolled out of bed. Dry blood noted in pts mouth. Abrasion to L eyebrow

## 2024-07-01 ENCOUNTER — Ambulatory Visit: Admitting: Internal Medicine

## 2024-07-21 ENCOUNTER — Encounter: Payer: Self-pay | Admitting: Internal Medicine

## 2024-07-21 ENCOUNTER — Other Ambulatory Visit: Payer: Self-pay

## 2024-07-21 ENCOUNTER — Ambulatory Visit (INDEPENDENT_AMBULATORY_CARE_PROVIDER_SITE_OTHER): Admitting: Internal Medicine

## 2024-07-21 DIAGNOSIS — M869 Osteomyelitis, unspecified: Secondary | ICD-10-CM | POA: Diagnosis not present

## 2024-07-21 NOTE — Progress Notes (Signed)
 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   Acute urinary retention 02/18/2024   Polypharmacy 02/15/2024   Hypokalemia 02/15/2024   Multiple wounds of skin 02/14/2024   Sepsis due to cellulitis (HCC) 02/14/2024   Transaminitis 02/14/2024   Fall at home, initial encounter 02/14/2024   Gait disturbance 02/14/2024   Depression 02/14/2024   Insomnia 02/14/2024   BPH (benign prostatic hyperplasia) 02/14/2024   GERD (gastroesophageal reflux disease) 02/14/2024   Pain due to onychomycosis of toenails of both feet 01/31/2023   Parkinson's disease (HCC) 11/25/2017   Diabetic polyneuropathy associated with type 2 diabetes mellitus (HCC) 11/25/2017   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   S/P lumbar spinal fusion 03/14/2016   S/P lumbar laminectomy 09/08/2015   Sleep disturbance 03/24/2014     Subjective:  Douglas Price is a Douglas Price is a 73 y.o. -year-old male with past medical history of Parkinson's disease, chronic Foley, GERD, RLS, diabetes type 2 presents for hospital follow-up of Proteus Mirabella's bacteremia in the setting of stage IV decubitus ulcer.  Source of bacteremia was likely sacral ulcer.  Necrotic eschar on ulcer probably could benefit from wound care noted.  General surgery recommended local wound care and pressure displacement otherwise no surgical I&D recommended. - Patient was discharged on Doxycycline  and Augmentin  x 6 weeks EOT 9/11 06/25/24 : - No concern for surrounding infection/cellulitis  Wound was open.  Brother present during visit. Today: pt presents form SNF on iv merrem. He is not sure why he is here at appointment. Statese wound is about the same.  Review of Systems  All other systems reviewed and are negative.   Past Medical History:  Diagnosis Date   Allergic  rhinitis due to pollen    Benign neoplasm of colon    Blisters with epidermal loss due to burn (second degree) of foot    Complication of anesthesia    pt woke up during saphenous vein surgery-had epidural   Constipation due to pain medication    Degeneration of lumbar or lumbosacral intervertebral disc    Diverticulosis of colon (without mention of hemorrhage)    DM (diabetes mellitus) (HCC)    Esophageal reflux    Hypertrophy of prostate with urinary obstruction and other lower urinary tract symptoms (LUTS)    Loss of weight    Neuromuscular disorder (HCC)    peripheral neuropathy   Obesity, unspecified    Pain in limb    Restless legs syndrome (RLS)    Sleep disturbance 03/24/2014   Spinal stenosis, unspecified region other than cervical    Tobacco use disorder    Unspecified disease of pericardium    Unspecified essential hypertension    Unspecified local infection of skin and subcutaneous tissue    Varices of other sites     Outpatient Medications Prior to Visit  Medication Sig Dispense Refill   acetaminophen  (TYLENOL ) 325 MG tablet Take 650 mg by mouth every 6 (six) hours as needed.     bisacodyl  (DULCOLAX) 5 MG EC tablet Take 2 tablets (10 mg total) by mouth daily as needed for moderate constipation.     buPROPion  (WELLBUTRIN  XL) 300 MG 24 hr tablet TAKE 1 TABLET(300 MG) BY MOUTH DAILY 30 tablet 0   carbidopa -levodopa  (SINEMET  IR) 25-100 MG tablet Take 1 tablet by mouth 3 (  three) times daily.     DULoxetine  (CYMBALTA ) 60 MG capsule Take 120 mg by mouth daily.      dutasteride  (AVODART ) 0.5 MG capsule Take 0.5 mg by mouth daily.     clonazePAM  (KLONOPIN ) 0.5 MG tablet Take 1 tablet (0.5 mg total) by mouth 2 (two) times daily as needed for anxiety. 60 tablet 0   collagenase  (SANTYL ) 250 UNIT/GM ointment Apply topically daily.     feeding supplement (ENSURE PLUS HIGH PROTEIN) LIQD Take 237 mLs by mouth 2 (two) times daily between meals.     hydrocortisone  cream 1 % Apply  topically 2 (two) times daily.     lithium  carbonate 150 MG capsule TAKE 1 CAPSULE(150 MG) BY MOUTH EVERY EVENING 30 capsule 0   liver oil-zinc  oxide (DESITIN) 40 % ointment Apply topically 2 (two) times daily.     loratadine (CLARITIN) 10 MG tablet Take 10 mg by mouth daily as needed for allergies, rhinitis or itching.     magnesium  hydroxide (MILK OF MAGNESIA) 400 MG/5ML suspension Take 30 mLs by mouth daily as needed for mild constipation.     metFORMIN  (GLUCOPHAGE ) 500 MG tablet Take 1,000 mg by mouth 2 (two) times daily with a meal.     mirtazapine  (REMERON ) 15 MG tablet Take 15 mg by mouth at bedtime.     Multiple Vitamin (MULTIVITAMIN WITH MINERALS) TABS tablet Take 1 tablet by mouth daily.     nebivolol  (BYSTOLIC ) 2.5 MG tablet Take 2.5 mg by mouth daily.     nutrition supplement, JUVEN, (JUVEN) PACK Take 1 packet by mouth 2 (two) times daily between meals.     omeprazole (PRILOSEC) 20 MG capsule Take 20 mg by mouth 2 (two) times daily before a meal.      oxyCODONE  (OXY IR/ROXICODONE ) 5 MG immediate release tablet Take 1-2 tablets (5-10 mg total) by mouth every 4 (four) hours as needed for moderate pain (pain score 4-6). (Patient not taking: Reported on 06/25/2024) 30 tablet 0   polyethylene glycol (MIRALAX  / GLYCOLAX ) 17 g packet Take 17 g by mouth daily.     pregabalin  (LYRICA ) 75 MG capsule Take 1 capsule (75 mg total) by mouth 2 (two) times daily. 20 capsule 0   SODIUM PHOSPHATES RE Place rectally.     tamsulosin  (FLOMAX ) 0.4 MG CAPS capsule Take 0.4 mg by mouth daily.     telmisartan  (MICARDIS ) 40 MG tablet Take 40 mg by mouth daily.     temazepam  (RESTORIL ) 15 MG capsule Take 1 capsule (15 mg total) by mouth at bedtime as needed for sleep. (Patient not taking: Reported on 06/25/2024) 7 capsule 0   No facility-administered medications prior to visit.     Allergies  Allergen Reactions   Avelox [Moxifloxacin] Other (See Comments)    Angioedema Fatigue   Quinolones Other (See  Comments)    Angioedema   Lexapro [Escitalopram] Other (See Comments)    unknown    Social History   Tobacco Use   Smoking status: Some Days    Types: Pipe   Smokeless tobacco: Never   Tobacco comments:    moderate, 1-2bowls a day or more  Vaping Use   Vaping status: Never Used  Substance Use Topics   Alcohol use: No    Alcohol/week: 0.0 standard drinks of alcohol   Drug use: No    Family History  Problem Relation Age of Onset   Heart failure Mother    Lung cancer Father    Neuropathy Brother  Hypertension Paternal Grandfather    Tuberculosis Maternal Uncle    Cancer - Colon Maternal Uncle     Objective:   Vitals:   07/21/24 1557  BP: (!) 89/64  Pulse: 97  Temp: 97.6 F (36.4 C)  TempSrc: Temporal  SpO2: 96%   There is no height or weight on file to calculate BMI.  Physical Exam Constitutional:      General: He is not in acute distress.    Appearance: He is normal weight. He is not toxic-appearing.  HENT:     Head: Normocephalic and atraumatic.     Right Ear: External ear normal.     Left Ear: External ear normal.     Nose: No congestion or rhinorrhea.     Mouth/Throat:     Mouth: Mucous membranes are moist.     Pharynx: Oropharynx is clear.  Eyes:     Extraocular Movements: Extraocular movements intact.     Conjunctiva/sclera: Conjunctivae normal.     Pupils: Pupils are equal, round, and reactive to light.  Cardiovascular:     Rate and Rhythm: Normal rate and regular rhythm.     Heart sounds: No murmur heard.    No friction rub. No gallop.  Pulmonary:     Effort: Pulmonary effort is normal.     Breath sounds: Normal breath sounds.  Abdominal:     General: Abdomen is flat. Bowel sounds are normal.     Palpations: Abdomen is soft.  Musculoskeletal:     Cervical back: Normal range of motion and neck supple.  Skin:    General: Skin is warm and dry.  Neurological:     General: No focal deficit present.     Mental Status: He is oriented to  person, place, and time.  Psychiatric:        Mood and Affect: Mood normal.     Lab Results: Lab Results  Component Value Date   WBC 15.5 (H) 06/10/2024   HGB 12.8 (L) 06/10/2024   HCT 41.0 06/10/2024   MCV 88.0 06/10/2024   PLT 309 06/10/2024    Lab Results  Component Value Date   CREATININE 0.98 06/10/2024   BUN 22 06/10/2024   NA 137 06/10/2024   K 4.6 06/10/2024   CL 102 06/10/2024   CO2 24 06/10/2024    Lab Results  Component Value Date   ALT 16 05/21/2024   AST 18 05/21/2024   ALKPHOS 65 04/23/2024   BILITOT 0.4 05/21/2024     Assessment & Plan:  #Proteus mirabilis bacteremia secondary to sacral decubitus ulcer - Blood cultures on 7/31 grew 1 out of 2 Proteus mirabilis - 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 - General Surgery was consulted and recommended local wound care and pressure offloading. - Complete doxycycline  and Augmentin  x 2 weeks weeks EOT 9/11  -Reviewed labs off antibiotics on 9/29: WBC 10.9 K, serum creatinine 0.67, CRP 0.26, ESR 34. No concern for acute infection at last visit. -facility requested appt, pt on merrem, no further information sent Plan: -please send information for reason for visit. We called and left messgae for SNF. -Defer management of IV abx to ordering provider -pt states wound is about the same.    Loney Stank, MD [facility] Addendum: pt was delayed 20 minutes to appt. Seen. Unfortunaly no paperwork indicating reason for visit. We did  see.  = pt did not know indicaiton for visit.   07/21/24  4:01 PM  I have personally spent 37 minutes involved in face-to-face and non-face-to-face activities for this patient on the day of the visit. Professional time spent includes the following activities: Preparing to see the patient (review of tests),  Obtaining and/or reviewing separately obtained history (admission/discharge record), Performing a medically appropriate examination and/or evaluation , Ordering medications/tests/procedures, referring and communicating with other health care professionals, Documenting clinical information in the EMR, Independently interpreting results (not separately reported), Communicating results to the patient/family/caregiver, Counseling and educating the patient/family/caregiver and Care coordination (not separately reported).

## 2024-07-21 NOTE — Patient Instructions (Addendum)
 Pt is not aware the reason for visit. Please send indication for visit. Left voicemail at snf for further information. Defer abx management to ordering provider F/U as needed

## 2024-07-23 ENCOUNTER — Other Ambulatory Visit: Payer: Self-pay

## 2024-07-23 ENCOUNTER — Inpatient Hospital Stay (HOSPITAL_COMMUNITY)
Admission: EM | Admit: 2024-07-23 | Discharge: 2024-07-29 | DRG: 314 | Disposition: A | Discharge status: Skilled Nursing Facility | Source: Skilled Nursing Facility | Attending: Internal Medicine | Admitting: Internal Medicine

## 2024-07-23 ENCOUNTER — Ambulatory Visit: Admitting: Internal Medicine

## 2024-07-23 VITALS — BP 84/54 | HR 96 | Temp 97.9°F

## 2024-07-23 DIAGNOSIS — G4700 Insomnia, unspecified: Secondary | ICD-10-CM | POA: Diagnosis present

## 2024-07-23 DIAGNOSIS — R338 Other retention of urine: Secondary | ICD-10-CM | POA: Diagnosis present

## 2024-07-23 DIAGNOSIS — D509 Iron deficiency anemia, unspecified: Secondary | ICD-10-CM | POA: Diagnosis present

## 2024-07-23 DIAGNOSIS — Z66 Do not resuscitate: Secondary | ICD-10-CM | POA: Diagnosis present

## 2024-07-23 DIAGNOSIS — A419 Sepsis, unspecified organism: Principal | ICD-10-CM | POA: Diagnosis present

## 2024-07-23 DIAGNOSIS — R6521 Severe sepsis with septic shock: Secondary | ICD-10-CM | POA: Diagnosis present

## 2024-07-23 DIAGNOSIS — E119 Type 2 diabetes mellitus without complications: Secondary | ICD-10-CM

## 2024-07-23 DIAGNOSIS — G20A1 Parkinson's disease without dyskinesia, without mention of fluctuations: Secondary | ICD-10-CM | POA: Diagnosis present

## 2024-07-23 DIAGNOSIS — T83511A Infection and inflammatory reaction due to indwelling urethral catheter, initial encounter: Secondary | ICD-10-CM | POA: Diagnosis present

## 2024-07-23 DIAGNOSIS — Z8249 Family history of ischemic heart disease and other diseases of the circulatory system: Secondary | ICD-10-CM

## 2024-07-23 DIAGNOSIS — I9589 Other hypotension: Secondary | ICD-10-CM | POA: Diagnosis present

## 2024-07-23 DIAGNOSIS — Z801 Family history of malignant neoplasm of trachea, bronchus and lung: Secondary | ICD-10-CM

## 2024-07-23 DIAGNOSIS — I959 Hypotension, unspecified: Secondary | ICD-10-CM | POA: Diagnosis not present

## 2024-07-23 DIAGNOSIS — T80211A Bloodstream infection due to central venous catheter, initial encounter: Principal | ICD-10-CM | POA: Diagnosis present

## 2024-07-23 DIAGNOSIS — Y846 Urinary catheterization as the cause of abnormal reaction of the patient, or of later complication, without mention of misadventure at the time of the procedure: Secondary | ICD-10-CM | POA: Diagnosis present

## 2024-07-23 DIAGNOSIS — E1142 Type 2 diabetes mellitus with diabetic polyneuropathy: Secondary | ICD-10-CM | POA: Diagnosis present

## 2024-07-23 DIAGNOSIS — M869 Osteomyelitis, unspecified: Secondary | ICD-10-CM

## 2024-07-23 DIAGNOSIS — E1169 Type 2 diabetes mellitus with other specified complication: Secondary | ICD-10-CM | POA: Diagnosis present

## 2024-07-23 DIAGNOSIS — Y848 Other medical procedures as the cause of abnormal reaction of the patient, or of later complication, without mention of misadventure at the time of the procedure: Secondary | ICD-10-CM | POA: Diagnosis present

## 2024-07-23 DIAGNOSIS — I10 Essential (primary) hypertension: Secondary | ICD-10-CM | POA: Diagnosis present

## 2024-07-23 DIAGNOSIS — N400 Enlarged prostate without lower urinary tract symptoms: Secondary | ICD-10-CM | POA: Diagnosis present

## 2024-07-23 DIAGNOSIS — Z87891 Personal history of nicotine dependence: Secondary | ICD-10-CM

## 2024-07-23 DIAGNOSIS — E559 Vitamin D deficiency, unspecified: Secondary | ICD-10-CM | POA: Diagnosis present

## 2024-07-23 DIAGNOSIS — S31000A Unspecified open wound of lower back and pelvis without penetration into retroperitoneum, initial encounter: Secondary | ICD-10-CM | POA: Insufficient documentation

## 2024-07-23 DIAGNOSIS — G2581 Restless legs syndrome: Secondary | ICD-10-CM | POA: Diagnosis present

## 2024-07-23 DIAGNOSIS — N401 Enlarged prostate with lower urinary tract symptoms: Secondary | ICD-10-CM | POA: Diagnosis present

## 2024-07-23 DIAGNOSIS — M4628 Osteomyelitis of vertebra, sacral and sacrococcygeal region: Secondary | ICD-10-CM | POA: Diagnosis present

## 2024-07-23 DIAGNOSIS — Z79899 Other long term (current) drug therapy: Secondary | ICD-10-CM

## 2024-07-23 DIAGNOSIS — L89154 Pressure ulcer of sacral region, stage 4: Secondary | ICD-10-CM | POA: Diagnosis present

## 2024-07-23 DIAGNOSIS — Z7984 Long term (current) use of oral hypoglycemic drugs: Secondary | ICD-10-CM

## 2024-07-23 DIAGNOSIS — F32A Depression, unspecified: Secondary | ICD-10-CM | POA: Diagnosis present

## 2024-07-23 DIAGNOSIS — D649 Anemia, unspecified: Secondary | ICD-10-CM | POA: Insufficient documentation

## 2024-07-23 DIAGNOSIS — K219 Gastro-esophageal reflux disease without esophagitis: Secondary | ICD-10-CM | POA: Diagnosis present

## 2024-07-23 DIAGNOSIS — L89313 Pressure ulcer of right buttock, stage 3: Secondary | ICD-10-CM | POA: Diagnosis present

## 2024-07-23 DIAGNOSIS — F419 Anxiety disorder, unspecified: Secondary | ICD-10-CM | POA: Diagnosis present

## 2024-07-23 DIAGNOSIS — R339 Retention of urine, unspecified: Secondary | ICD-10-CM | POA: Diagnosis present

## 2024-07-23 LAB — COMPREHENSIVE METABOLIC PANEL WITH GFR
ALT: 22 U/L (ref 0–44)
AST: 15 U/L (ref 15–41)
Albumin: 2.3 g/dL — ABNORMAL LOW (ref 3.5–5.0)
Alkaline Phosphatase: 71 U/L (ref 38–126)
Anion gap: 11 (ref 5–15)
BUN: 16 mg/dL (ref 8–23)
CO2: 27 mmol/L (ref 22–32)
Calcium: 7.9 mg/dL — ABNORMAL LOW (ref 8.9–10.3)
Chloride: 101 mmol/L (ref 98–111)
Creatinine, Ser: 1.09 mg/dL (ref 0.61–1.24)
GFR, Estimated: >60 mL/min (ref >60)
Glucose, Bld: 108 mg/dL — ABNORMAL HIGH (ref 70–99)
Potassium: 4.1 mmol/L (ref 3.5–5.1)
Sodium: 139 mmol/L (ref 135–145)
Total Bilirubin: 0.4 mg/dL (ref 0.0–1.2)
Total Protein: 5.5 g/dL — ABNORMAL LOW (ref 6.5–8.1)

## 2024-07-23 LAB — CBC WITH DIFFERENTIAL/PLATELET
Abs Immature Granulocytes: 0.17 K/uL — ABNORMAL HIGH (ref 0.00–0.07)
Basophils Absolute: 0.1 K/uL (ref 0.0–0.1)
Basophils Relative: 1 %
Eosinophils Absolute: 0.7 K/uL — ABNORMAL HIGH (ref 0.0–0.5)
Eosinophils Relative: 4 %
HCT: 33 % — ABNORMAL LOW (ref 39.0–52.0)
Hemoglobin: 10.2 g/dL — ABNORMAL LOW (ref 13.0–17.0)
Immature Granulocytes: 1 %
Lymphocytes Relative: 9 %
Lymphs Abs: 1.6 K/uL (ref 0.7–4.0)
MCH: 27.3 pg (ref 26.0–34.0)
MCHC: 30.9 g/dL (ref 30.0–36.0)
MCV: 88.2 fL (ref 80.0–100.0)
Monocytes Absolute: 1.3 K/uL — ABNORMAL HIGH (ref 0.1–1.0)
Monocytes Relative: 7 %
Neutro Abs: 14.8 K/uL — ABNORMAL HIGH (ref 1.7–7.7)
Neutrophils Relative %: 78 %
Platelets: 440 K/uL — ABNORMAL HIGH (ref 150–400)
RBC: 3.74 MIL/uL — ABNORMAL LOW (ref 4.22–5.81)
RDW: 15.5 % (ref 11.5–15.5)
WBC: 18.6 K/uL — ABNORMAL HIGH (ref 4.0–10.5)
nRBC: 0 % (ref 0.0–0.2)

## 2024-07-23 MED ORDER — SODIUM CHLORIDE 0.9 % IV BOLUS
1000.0000 mL | Freq: Once | INTRAVENOUS | Status: AC
  Administered 2024-07-23: 1000 mL via INTRAVENOUS

## 2024-07-23 NOTE — Progress Notes (Signed)
 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   Acute urinary retention 02/18/2024   Polypharmacy 02/15/2024   Hypokalemia 02/15/2024   Multiple wounds of skin 02/14/2024   Sepsis due to cellulitis (HCC) 02/14/2024   Transaminitis 02/14/2024   Fall at home, initial encounter 02/14/2024   Gait disturbance 02/14/2024   Depression 02/14/2024   Insomnia 02/14/2024   BPH (benign prostatic hyperplasia) 02/14/2024   GERD (gastroesophageal reflux disease) 02/14/2024   Pain due to onychomycosis of toenails of both feet 01/31/2023   Parkinson's disease (HCC) 11/25/2017   Diabetic polyneuropathy associated with type 2 diabetes mellitus (HCC) 11/25/2017   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   S/P lumbar spinal fusion 03/14/2016   S/P lumbar laminectomy 09/08/2015   Sleep disturbance 03/24/2014     Subjective:  Douglas Price is a 73 y.o. @GENDER @ with  Pt states he is moving around less.  Review of Systems  All other systems reviewed and are negative.   Past Medical History:  Diagnosis Date   Allergic rhinitis due to pollen    Benign neoplasm of colon    Blisters with epidermal loss due to burn (second degree) of foot    Complication of anesthesia    pt woke up during saphenous vein surgery-had epidural   Constipation due to pain medication    Degeneration of lumbar or lumbosacral intervertebral disc    Diverticulosis of colon (without mention of hemorrhage)    DM (diabetes mellitus) (HCC)    Esophageal reflux    Hypertrophy of prostate with urinary obstruction and other lower urinary tract symptoms (LUTS)    Loss of weight    Neuromuscular disorder (HCC)    peripheral neuropathy   Obesity, unspecified    Pain in limb    Restless legs  syndrome (RLS)    Sleep disturbance 03/24/2014   Spinal stenosis, unspecified region other than cervical    Tobacco use disorder    Unspecified disease of pericardium    Unspecified essential hypertension    Unspecified local infection of skin and subcutaneous tissue    Varices of other sites     Outpatient Medications Prior to Visit  Medication Sig Dispense Refill   acetaminophen  (TYLENOL ) 325 MG tablet Take 650 mg by mouth every 6 (six) hours as needed.     bisacodyl  (DULCOLAX) 5 MG EC tablet Take 2 tablets (10 mg total) by mouth daily as needed for moderate constipation.     buPROPion  (WELLBUTRIN  XL) 300 MG 24 hr tablet TAKE 1 TABLET(300 MG) BY MOUTH DAILY 30 tablet 0   carbidopa -levodopa  (SINEMET  IR) 25-100 MG tablet Take 1 tablet by mouth 3 (three) times daily.     clonazePAM  (KLONOPIN ) 0.5 MG tablet Take 1 tablet (0.5 mg total) by mouth 2 (two) times daily as needed for anxiety. 60 tablet 0   collagenase  (SANTYL ) 250 UNIT/GM ointment Apply topically daily.     DULoxetine  (CYMBALTA ) 60 MG capsule Take 120 mg by mouth daily.      dutasteride  (AVODART ) 0.5 MG capsule Take 0.5 mg by mouth daily.     feeding supplement (ENSURE PLUS HIGH PROTEIN) LIQD Take 237 mLs by mouth 2 (two) times daily between meals.     hydrocortisone  cream 1 % Apply topically 2 (  two) times daily.     lithium  carbonate 150 MG capsule TAKE 1 CAPSULE(150 MG) BY MOUTH EVERY EVENING 30 capsule 0   liver oil-zinc  oxide (DESITIN) 40 % ointment Apply topically 2 (two) times daily.     loratadine (CLARITIN) 10 MG tablet Take 10 mg by mouth daily as needed for allergies, rhinitis or itching.     magnesium  hydroxide (MILK OF MAGNESIA) 400 MG/5ML suspension Take 30 mLs by mouth daily as needed for mild constipation.     meropenem (MERREM) IVPB Inject 1 g into the vein every 8 (eight) hours.     metFORMIN  (GLUCOPHAGE ) 500 MG tablet Take 1,000 mg by mouth 2 (two) times daily with a meal.     mirtazapine  (REMERON ) 15 MG tablet  Take 15 mg by mouth at bedtime.     Multiple Vitamin (MULTIVITAMIN WITH MINERALS) TABS tablet Take 1 tablet by mouth daily.     nebivolol  (BYSTOLIC ) 2.5 MG tablet Take 2.5 mg by mouth daily.     nutrition supplement, JUVEN, (JUVEN) PACK Take 1 packet by mouth 2 (two) times daily between meals.     omeprazole (PRILOSEC) 20 MG capsule Take 20 mg by mouth 2 (two) times daily before a meal.      oxyCODONE  (OXY IR/ROXICODONE ) 5 MG immediate release tablet Take 1-2 tablets (5-10 mg total) by mouth every 4 (four) hours as needed for moderate pain (pain score 4-6). 30 tablet 0   polyethylene glycol (MIRALAX  / GLYCOLAX ) 17 g packet Take 17 g by mouth daily.     pregabalin  (LYRICA ) 75 MG capsule Take 1 capsule (75 mg total) by mouth 2 (two) times daily. 20 capsule 0   SODIUM PHOSPHATES RE Place rectally.     tamsulosin  (FLOMAX ) 0.4 MG CAPS capsule Take 0.4 mg by mouth daily.     telmisartan  (MICARDIS ) 40 MG tablet Take 40 mg by mouth daily.     temazepam  (RESTORIL ) 15 MG capsule Take 1 capsule (15 mg total) by mouth at bedtime as needed for sleep. (Patient not taking: Reported on 06/25/2024) 7 capsule 0   No facility-administered medications prior to visit.     Allergies  Allergen Reactions   Avelox [Moxifloxacin] Other (See Comments)    Angioedema Fatigue   Quinolones Other (See Comments)    Angioedema   Lexapro [Escitalopram] Other (See Comments)    unknown    Social History   Tobacco Use   Smoking status: Some Days    Types: Pipe   Smokeless tobacco: Never   Tobacco comments:    moderate, 1-2bowls a day or more  Vaping Use   Vaping status: Never Used  Substance Use Topics   Alcohol use: No    Alcohol/week: 0.0 standard drinks of alcohol   Drug use: No    Family History  Problem Relation Age of Onset   Heart failure Mother    Lung cancer Father    Neuropathy Brother    Hypertension Paternal Grandfather    Tuberculosis Maternal Uncle    Cancer - Colon Maternal Uncle      Objective:   Vitals:   07/23/24 1544  BP: (!) 85/52  Pulse: 96  Temp: 97.9 F (36.6 C)  TempSrc: Oral  SpO2: 95%   There is no height or weight on file to calculate BMI.  Physical Exam Constitutional:      General: He is not in acute distress.    Appearance: He is normal weight. He is not toxic-appearing.  HENT:  Head: Normocephalic and atraumatic.     Right Ear: External ear normal.     Left Ear: External ear normal.     Nose: No congestion or rhinorrhea.     Mouth/Throat:     Mouth: Mucous membranes are moist.     Pharynx: Oropharynx is clear.  Eyes:     Extraocular Movements: Extraocular movements intact.     Conjunctiva/sclera: Conjunctivae normal.     Pupils: Pupils are equal, round, and reactive to light.  Cardiovascular:     Rate and Rhythm: Normal rate and regular rhythm.     Heart sounds: No murmur heard.    No friction rub. No gallop.  Pulmonary:     Effort: Pulmonary effort is normal.     Breath sounds: Normal breath sounds.  Abdominal:     General: Abdomen is flat. Bowel sounds are normal.     Palpations: Abdomen is soft.  Musculoskeletal:     Cervical back: Normal range of motion and neck supple.  Skin:    General: Skin is warm and dry.  Neurological:     General: No focal deficit present.     Mental Status: He is oriented to person, place, and time.  Psychiatric:        Mood and Affect: Mood normal.     Lab Results: Lab Results  Component Value Date   WBC 15.5 (H) 06/10/2024   HGB 12.8 (L) 06/10/2024   HCT 41.0 06/10/2024   MCV 88.0 06/10/2024   PLT 309 06/10/2024    Lab Results  Component Value Date   CREATININE 0.98 06/10/2024   BUN 22 06/10/2024   NA 137 06/10/2024   K 4.6 06/10/2024   CL 102 06/10/2024   CO2 24 06/10/2024    Lab Results  Component Value Date   ALT 16 05/21/2024   AST 18 05/21/2024   ALKPHOS 65 04/23/2024   BILITOT 0.4 05/21/2024     Assessment & Plan:  #sacral decubitus ulcer  with  osteo #Hypotention -Hx of Proteus mirabilis bacteremia secondary to sacral decubitus ulcer. Blood cultures on 7/31 grew 1 out of 2 Proteus mirabilis. 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  Completed doxycycline  and Augmentin  x 2 weeks weeks EOT 9/11. Doing well off of abx and recc f/u as needed. Reviewed SNF notes: Pt on merrem sine 10/22. States he gets daily wound care. [facility] n[facility]  recent past.  Labs for 10/24 stable wbc 14.8, scr 0.62, esr 36, crp 3.37 Revied nots form ocar njihi, pt on keflex  500 tid for wound treatment Reviewed 10/28 wound care note: Noted stage IV pressure injury to sacrum small amount of serous exudate noted.  Erythema, increased exudate and wound tenderness noted, ABT therapy in place. -Hypotensive(80/50s), he is declining going to the ED after counseling to do so, he states his baseline lightheadedness is slightly worse. He states he would like to go to ED once returns to SNF. Defer IV antibiotic management to ordering provider. I will order MRI, if no abscess then treat with short coarse of abx(2 weeks) and continue aggressive wound care. Please continue merrem till mri results Please send wound care pictures as we do not have a hoyer lift. He was not stable enough to stand on his own Of note keflex  is a qid medication, consider cefadroxil  1gm bid -F/u with ID in 3 weeks   Loney Stank, MD [facility]   07/23/24  3:50 PM I have personally spent 42 minutes involved in face-to-face and non-face-to-face activities for this patient on the day of the visit. Professional time spent includes the following activities: Preparing to see the patient (review of tests), Obtaining and/or reviewing  separately obtained history (admission/discharge record), Performing a medically appropriate examination and/or evaluation , Ordering medications/tests/procedures, referring and communicating with other health care professionals, Documenting clinical information in the EMR, Independently interpreting results (not separately reported), Communicating results to the patient/family/caregiver, Counseling and educating the patient/family/caregiver and Care coordination (not separately reported).

## 2024-07-23 NOTE — ED Provider Notes (Incomplete)
 [place] EMERGENCY DEPARTMENT AT [facility] Provider Note   CSN: [id number] Arrival date & time: 07/23/24  2223     Patient presents with: Hypotension   MAR Douglas Price is a 73 y.o. male with a history of a sacral wound infection x 3 months, who is being followed by infectious disease, presents to the ED with hypotension that began tonight.  The nursing facility that patient currently resides at noticed that he was hypotensive and complaining of dizziness and sent him to the emergency department via EMS for evaluation.  The patient is currently alert and oriented, he states that the symptoms started this evening and he still currently feels weak and dizzy. The patient reports no syncope, near syncope, shortness of breath, chest pain, fevers, or chills.  The patient states that he does have pain that he describes as a deep ache in his left hip.  No recent travel. No sick contacts.   {Add pertinent medical, surgical, social history, OB history to HPI:32947} HPI     Prior to Admission medications   Medication Sig Start Date End Date Taking? Authorizing Provider  acetaminophen  (TYLENOL ) 325 MG tablet Take 650 mg by mouth every 6 (six) hours as needed.    [provider]  bisacodyl  (DULCOLAX) 5 MG EC tablet Take 2 tablets (10 mg total) by mouth daily as needed for moderate constipation. 04/27/24   Amin, Ankit C, MD  buPROPion  (WELLBUTRIN  XL) 300 MG 24 hr tablet TAKE 1 TABLET(300 MG) BY MOUTH DAILY 02/18/24   Rhys Boyer T, PA-C  carbidopa -levodopa  (SINEMET  IR) 25-100 MG tablet Take 1 tablet by mouth 3 (three) times daily. 02/27/24   Patsy Lenis, MD  clonazePAM  (KLONOPIN ) 0.5 MG tablet Take 1 tablet (0.5 mg total) by mouth 2 (two) times daily as needed for anxiety. 06/04/24   Amin, Saad, MD  collagenase  (SANTYL ) 250 UNIT/GM ointment Apply topically daily. 04/28/24   Amin, Ankit C, MD  DULoxetine  (CYMBALTA ) 60 MG capsule Take 120 mg by mouth daily.     [provider]   dutasteride  (AVODART ) 0.5 MG capsule Take 0.5 mg by mouth daily.    [provider]  feeding supplement (ENSURE PLUS HIGH PROTEIN) LIQD Take 237 mLs by mouth 2 (two) times daily between meals. 04/27/24   Amin, Ankit C, MD  hydrocortisone  cream 1 % Apply topically 2 (two) times daily. 04/27/24   Amin, Ankit C, MD  lithium  carbonate 150 MG capsule TAKE 1 CAPSULE(150 MG) BY MOUTH EVERY EVENING 02/18/24   Rhys Boyer T, PA-C  liver oil-zinc  oxide (DESITIN) 40 % ointment Apply topically 2 (two) times daily. 04/27/24   Amin, Ankit C, MD  loratadine (CLARITIN) 10 MG tablet Take 10 mg by mouth daily as needed for allergies, rhinitis or itching.    [provider]  magnesium  hydroxide (MILK OF MAGNESIA) 400 MG/5ML suspension Take 30 mLs by mouth daily as needed for mild constipation.    [provider]  meropenem (MERREM) IVPB Inject 1 g into the vein every 8 (eight) hours.    [provider]  metFORMIN  (GLUCOPHAGE ) 500 MG tablet Take 1,000 mg by mouth 2 (two) times daily with a meal.    [provider]  mirtazapine  (REMERON ) 15 MG tablet Take 15 mg by mouth at bedtime.    [provider]  Multiple Vitamin (MULTIVITAMIN WITH MINERALS) TABS tablet Take 1 tablet by mouth daily. 04/27/24   Amin, Ankit C, MD  nebivolol  (BYSTOLIC ) 2.5 MG tablet Take 2.5  mg by mouth daily.    [provider]  nutrition supplement, JUVEN, (JUVEN) PACK Take 1 packet by mouth 2 (two) times daily between meals. 04/27/24   Amin, Ankit C, MD  omeprazole (PRILOSEC) 20 MG capsule Take 20 mg by mouth 2 (two) times daily before a meal.     [provider]  oxyCODONE  (OXY IR/ROXICODONE ) 5 MG immediate release tablet Take 1-2 tablets (5-10 mg total) by mouth every 4 (four) hours as needed for moderate pain (pain score 4-6). 04/27/24   Amin, Ankit C, MD  polyethylene glycol (MIRALAX  / GLYCOLAX ) 17 g packet Take 17 g by mouth daily.    [provider]  pregabalin  (LYRICA ) 75  MG capsule Take 1 capsule (75 mg total) by mouth 2 (two) times daily. 02/27/24   Patsy Lenis, MD  SODIUM PHOSPHATES RE Place rectally.    [provider]  tamsulosin  (FLOMAX ) 0.4 MG CAPS capsule Take 0.4 mg by mouth daily.    [provider]  telmisartan  (MICARDIS ) 40 MG tablet Take 40 mg by mouth daily.    [provider]  temazepam  (RESTORIL ) 15 MG capsule Take 1 capsule (15 mg total) by mouth at bedtime as needed for sleep. Patient not taking: Reported on 06/25/2024 04/27/24   Caleen Burgess BROCKS, MD    Allergies: Avelox [moxifloxacin], Quinolones, and Lexapro [escitalopram]    Review of Systems  Neurological:  Positive for weakness.    Updated Vital Signs BP (!) 94/59   Pulse 71   Temp 97.9 F (36.6 C)   Resp 14   SpO2 99%   Physical Exam Vitals and nursing note reviewed.  Constitutional:      General: He is not in acute distress.    Appearance: Normal appearance.  HENT:     Head: Normocephalic and atraumatic.  Eyes:     Extraocular Movements: Extraocular movements intact.     Conjunctiva/sclera: Conjunctivae normal.     Pupils: Pupils are equal, round, and reactive to light.  Cardiovascular:     Rate and Rhythm: Normal rate and regular rhythm.     Pulses: Normal pulses.  Pulmonary:     Effort: Pulmonary effort is normal. No respiratory distress.  Abdominal:     General: Abdomen is flat.     Palpations: Abdomen is soft.     Tenderness: There is no abdominal tenderness.  Musculoskeletal:        General: Normal range of motion.     Cervical back: Normal range of motion.  Skin:    General: Skin is warm and dry.     Capillary Refill: Capillary refill takes less than 2 seconds.  Neurological:     General: No focal deficit present.     Mental Status: He is alert. Mental status is at baseline.  Psychiatric:        Mood and Affect: Mood normal.     (all labs ordered are listed, but only abnormal results are displayed) Labs Reviewed   COMPREHENSIVE METABOLIC PANEL WITH GFR  CBC WITH DIFFERENTIAL/PLATELET  URINALYSIS, ROUTINE W REFLEX MICROSCOPIC  C-REACTIVE PROTEIN    EKG: None  Radiology: No results found.  {Document cardiac monitor, telemetry assessment procedure when appropriate:32947} Procedures   Medications Ordered in the ED  sodium chloride  0.9 % bolus 1,000 mL (has no administration in time range)      {Click here for ABCD2, HEART and other calculators REFRESH Note before signing:1}  Medical Decision Making Amount and/or Complexity of Data Reviewed Labs: ordered.   ***  {Document critical care time when appropriate  Document review of labs and clinical decision tools ie CHADS2VASC2, etc  Document your independent review of radiology images and any outside records  Document your discussion with family members, caretakers and with consultants  Document social determinants of health affecting pt's care  Document your decision making why or why not admission, treatments were needed:32947:::1}   Final diagnoses:  None    ED Discharge Orders     None

## 2024-07-23 NOTE — Patient Instructions (Addendum)
 Defer IV antibiotic management to ordering provider. I will order MRI, if no abscess then treat with short coarse of abx(2 weeks) and continue aggressive wound care. Please continue merrem till mri results Please send wound care pictures as we do not have a hoyer lift. He was not stable enough to stand on his own Of note keflex  is a qid medication, consider cefadroxil 1gm bid We obtained blood cx and labs today Of note pt's blood pressure low(85/50s) he is declining going to the ED after counseling to do so, he states his baseline lightheadedness is slightly worse. If low bp continues would recommend ed eval.  F/u with ID in 3 weeks

## 2024-07-23 NOTE — ED Provider Notes (Signed)
 [place] EMERGENCY DEPARTMENT AT [facility] Provider Note   CSN: [id number] Arrival date & time: 07/23/24  2223     Patient presents with: Hypotension   Douglas Price is a 73 y.o. male with a history of a sacral wound infection, who is being followed by infectious disease, presents to the ED with hypotension that began tonight.  The nursing facility that patient currently resides in noticed that he was hypotensive and complaining of dizziness and sent him to the emergency department via EMS for evaluation.  The patient is currently alert and oriented, he states that the symptoms started this evening and he still currently feels weak and dizzy. The patient reports no syncope, near syncope, shortness of breath, chest pain, fevers, or chills.  The patient states that he does have pain that he describes as a deep ache in his lower back/left hip where he has the infection, he also states this pain has been ongoing.    HPI     Prior to Admission medications   Medication Sig Start Date End Date Taking? Authorizing Provider  acetaminophen  (TYLENOL ) 325 MG tablet Take 650 mg by mouth every 6 (six) hours as needed.   Yes [provider]  bisacodyl  (DULCOLAX) 10 MG suppository Place 10 mg rectally as needed for moderate constipation. Insert one suppository rectally every 24 hours as needed for constipation.   Yes [provider]  buPROPion  (WELLBUTRIN  XL) 300 MG 24 hr tablet TAKE 1 TABLET(300 MG) BY MOUTH DAILY 02/18/24  Yes Hurst, Teresa T, PA-C  carbidopa -levodopa  (SINEMET  IR) 25-100 MG tablet Take 1 tablet by mouth 3 (three) times daily. Patient taking differently: Take 1 tablet by mouth 3 (three) times daily. Give one tablet by mouth three time a day at 1000, 1400, and 2200. 02/27/24  Yes Patsy Lenis, MD  clonazePAM  (KLONOPIN ) 0.5 MG tablet Take 1 tablet (0.5 mg total) by mouth 2 (two) times daily as needed for anxiety. Patient taking differently: Take 0.5 mg by  mouth 3 (three) times daily as needed for anxiety. Give one tablet (0.5mg ) by mouth every 8 hours as needed for tremors related to Parkinson's 06/04/24  Yes Caleen Dirks, MD  diphenhydrAMINE  (BENADRYL ) 25 MG tablet Take 25 mg by mouth daily as needed for itching. 07/22/24 08/04/24 Yes [provider]  DULoxetine  (CYMBALTA ) 60 MG capsule Take 120 mg by mouth at bedtime.   Yes [provider]  dutasteride  (AVODART ) 0.5 MG capsule Take 0.5 mg by mouth daily.   Yes [provider]  heparin  lock flush 100 UNIT/ML SOLN injection Inject 300 Units into the vein every 8 (eight) hours. Use 3ml intravenously every 8 hours to maintain patency.   Yes [provider]  lithium  carbonate 150 MG capsule TAKE 1 CAPSULE(150 MG) BY MOUTH EVERY EVENING Patient taking differently: Take 150 mg by mouth in the morning. 02/18/24  Yes Hurst, Teresa T, PA-C  loratadine (CLARITIN) 10 MG tablet Take 10 mg by mouth daily as needed for allergies, rhinitis or itching.   Yes [provider]  magnesium  hydroxide (MILK OF MAGNESIA) 400 MG/5ML suspension Take 30 mLs by mouth daily as needed for mild constipation.   Yes [provider]  meropenem (MERREM) IVPB Inject 1 g into the vein every 8 (eight) hours. Use 1 gram intravenously every 8 hours for sacral wound infection. Infuse over .   Yes [provider]  metFORMIN  (GLUCOPHAGE ) 500 MG tablet Take 1,000 mg by mouth 2 (two) times daily with  a meal.   Yes [provider]  mirtazapine  (REMERON ) 15 MG tablet Take 15 mg by mouth at bedtime.   Yes [provider]  Multiple Vitamins-Minerals (DECUBI-VITE) CAPS Take 1 capsule by mouth in the morning.   Yes [provider]  nebivolol  (BYSTOLIC ) 2.5 MG tablet Take 2.5 mg by mouth daily.   Yes [provider]  nutrition supplement, JUVEN, (JUVEN) PACK Take 1 packet by mouth 2 (two) times daily between meals. Patient taking differently: Take 1  packet by mouth 2 (two) times daily between meals. Give one packet by mouth two times a day between meals for protein supplement. Give at 1000 and 1800. 04/27/24  Yes Amin, Ankit C, MD  omeprazole (PRILOSEC) 20 MG capsule Take 20 mg by mouth 2 (two) times daily before a meal.    Yes [provider]  oxyCODONE -acetaminophen  (PERCOCET/ROXICET) 5-325 MG tablet Take 1 tablet by mouth every 8 (eight) hours. Give one tablet by mouth every 8 hours for pain.   Yes [provider]  Pollen Extracts (PROSTAT PO) Take 30 mLs by mouth 2 (two) times daily between meals. Give 30ml by mouth twice a day at 1000 and 1800 for nutrition support and wound healing.   Yes [provider]  polyethylene glycol (MIRALAX  / GLYCOLAX ) 17 g packet Take 17 g by mouth daily.   Yes [provider]  pregabalin  (LYRICA ) 75 MG capsule Take 1 capsule (75 mg total) by mouth 2 (two) times daily. 02/27/24  Yes Patsy Lenis, MD  sodium chloride  flush (NS) 0.9 % SOLN Inject 10 mLs into the vein every 8 (eight) hours. Use 10ml intravenously every 8 hours to maintain line patency. Inject at 0000, 0800, and 1600.   Yes [provider]  SODIUM PHOSPHATES RE Place 1 Bottle rectally as needed (constipation). Insert one sodium phosphate  enema rectally every 24 hours as needed if no relief from suppository.   Yes [provider]  tamsulosin  (FLOMAX ) 0.4 MG CAPS capsule Take 0.4 mg by mouth at bedtime.   Yes [provider]  telmisartan  (MICARDIS ) 40 MG tablet Take 40 mg by mouth daily.   Yes [provider]    Allergies: Avelox [moxifloxacin], Quinolones, and Lexapro [escitalopram]    Review of Systems  Neurological:  Positive for weakness.    Updated Vital Signs BP (!) 141/74   Pulse 93   Temp 99.3 F (37.4 C) (Oral)   Resp 16   SpO2 99%   Physical Exam Vitals and nursing note reviewed.  Constitutional:      General: He is not in acute distress.    Appearance: He is  ill-appearing.  HENT:     Head: Normocephalic and atraumatic.  Eyes:     General: Vision grossly intact. Gaze aligned appropriately.     Extraocular Movements: Extraocular movements intact.     Conjunctiva/sclera: Conjunctivae normal.     Pupils: Pupils are equal, round, and reactive to light.  Cardiovascular:     Rate and Rhythm: Normal rate and regular rhythm.     Pulses: Normal pulses.  Pulmonary:     Effort: Pulmonary effort is normal. No respiratory distress.     Breath sounds: Normal breath sounds.     Comments: Patient has no difficulty speaking complete sentences. Abdominal:     General: Abdomen is flat.     Palpations: Abdomen is soft.     Tenderness: There is no abdominal tenderness.  Musculoskeletal:        General:  Normal range of motion.     Cervical back: Normal range of motion.     Right lower leg: No edema.     Left lower leg: No edema.  Skin:    General: Skin is warm and dry.     Capillary Refill: Capillary refill takes less than 2 seconds.  Neurological:     General: No focal deficit present.     Mental Status: He is alert. Mental status is at baseline.  Psychiatric:        Mood and Affect: Mood normal.        Behavior: Behavior is cooperative.     (all labs ordered are listed, but only abnormal results are displayed) Labs Reviewed  COMPREHENSIVE METABOLIC PANEL WITH GFR - Abnormal; Notable for the following components:      Result Value   Glucose, Bld 108 (*)    Calcium 7.9 (*)    Total Protein 5.5 (*)    Albumin  2.3 (*)    All other components within normal limits  CBC WITH DIFFERENTIAL/PLATELET - Abnormal; Notable for the following components:   WBC 18.6 (*)    RBC 3.74 (*)    Hemoglobin 10.2 (*)    HCT 33.0 (*)    Platelets 440 (*)    Neutro Abs 14.8 (*)    Monocytes Absolute 1.3 (*)    Eosinophils Absolute 0.7 (*)    Abs Immature Granulocytes 0.17 (*)    All other components within normal limits  URINALYSIS, ROUTINE W REFLEX MICROSCOPIC -  Abnormal; Notable for the following components:   Color, Urine AMBER (*)    APPearance CLOUDY (*)    Hgb urine dipstick MODERATE (*)    Ketones, ur 5 (*)    Protein, ur 100 (*)    Nitrite POSITIVE (*)    Leukocytes,Ua LARGE (*)    Bacteria, UA RARE (*)    All other components within normal limits  C-REACTIVE PROTEIN - Abnormal; Notable for the following components:   CRP 1.6 (*)    All other components within normal limits  SEDIMENTATION RATE - Abnormal; Notable for the following components:   Sed Rate 23 (*)    All other components within normal limits  I-STAT CG4 LACTIC ACID, ED - Abnormal; Notable for the following components:   Lactic Acid, Venous 2.3 (*)    All other components within normal limits  URINE CULTURE  CULTURE, BLOOD (ROUTINE X 2)  CULTURE, BLOOD (ROUTINE X 2)  CBC  BASIC METABOLIC PANEL WITH GFR  HEPATIC FUNCTION PANEL  CBC WITH DIFFERENTIAL/PLATELET  LACTIC ACID, PLASMA  LACTIC ACID, PLASMA  LITHIUM  LEVEL  HEMOGLOBIN A1C  I-STAT CG4 LACTIC ACID, ED    EKG: EKG Interpretation Date/Time:  Friday July 24 2024 00:14:16 EDT Ventricular Rate:  93 PR Interval:  219 QRS Duration:  98 QT Interval:  362 QTC Calculation: 451 R Axis:   -38  Text Interpretation: Sinus rhythm Prolonged PR interval Left axis deviation Low voltage, precordial leads When compared with ECG of 03/19/2024, No significant change was found Confirmed by Raford Lenis ([id number]) on 07/24/2024 12:17:12 AM  Radiology: No results found.   Procedures   Medications Ordered in the ED  meropenem (MERREM) 1 g in sodium chloride  0.9 % 100 mL IVPB (has no administration in time range)  linezolid (ZYVOX) IVPB 600 mg (0 mg Intravenous Stopped 07/24/24 0414)  heparin  injection 5,000 Units (has no administration in time range)  acetaminophen  (TYLENOL ) tablet 650 mg (has no administration in  time range)    Or  acetaminophen  (TYLENOL ) suppository 650 mg (has no administration in time range)   insulin  aspart (novoLOG ) injection 0-9 Units (has no administration in time range)  lactated ringers  infusion (has no administration in time range)  buPROPion  (WELLBUTRIN  XL) 24 hr tablet 300 mg (has no administration in time range)  DULoxetine  (CYMBALTA ) DR capsule 120 mg (has no administration in time range)  lithium  carbonate capsule 150 mg (has no administration in time range)  mirtazapine  (REMERON ) tablet 15 mg (has no administration in time range)  pantoprazole  (PROTONIX ) EC tablet 40 mg (has no administration in time range)  dutasteride  (AVODART ) capsule 0.5 mg (has no administration in time range)  tamsulosin  (FLOMAX ) capsule 0.4 mg (has no administration in time range)  carbidopa -levodopa  (SINEMET  IR) 25-100 MG per tablet immediate release 1 tablet (has no administration in time range)  clonazePAM  (KLONOPIN ) tablet 0.5 mg (has no administration in time range)  pregabalin  (LYRICA ) capsule 75 mg (has no administration in time range)  sodium hypochlorite (DAKIN'S 1/4 STRENGTH) topical solution (has no administration in time range)  collagenase  (SANTYL ) ointment (has no administration in time range)  liver oil-zinc  oxide (DESITIN) 40 % ointment (has no administration in time range)  sodium chloride  0.9 % bolus 1,000 mL (0 mLs Intravenous Stopped 07/24/24 0004)  sodium chloride  0.9 % bolus 1,000 mL (0 mLs Intravenous Stopped 07/24/24 0646)    Clinical Course as of 07/24/24 0654  Fri Jul 24, 2024  0103 Given HR now >90 w/o trending leukocytosis, lactic and blood cultures added for sepsis evaluation. Otherwise hemodynamically stable. Chronically ill appearing, alert.  Urinalysis appears c/w infection; however, with foley catheter. Suspicious for chronic colonization; however, could be considered acute source of infection if MRI results appear stable compared to prior. [KH]  [phone] Given sepsis concerns with patient actively receiving Meropenem 1g TID, pharmacy recommends addition of Zyvox  for broader coverage. [place], pharmacist, to add additional abx orders. [KH]  [phone] RN called MRI to inquire about imaging. MRI techs having trouble getting noncontrast images to cross over. Patient allegedly declined administration of IV contrast for MRI; therefore contrasted images were not completed. [KH]  [phone] Hospitalist requesting exchange of foley catheter; patient reporting that existing catheter has been in place for more than 1 month. Order placed for catheter exchange. [KH]    Clinical Course User Index [KH] Keith Sor, PA-C                                 Medical Decision Making Amount and/or Complexity of Data Reviewed Labs: ordered.   Patient presents to the ED for concern of hypotension, this involves an extensive number of treatment options, and is a complaint that carries with it a high risk of complications and morbidity.    The differential diagnosis includes: Sepsis Other nonacute infectious etiology Hypotension  Co morbidities that complicate the patient evaluation: Ongoing infection followed by infectious disease  Additional history obtained: Additional history obtained from  Outside Medical Records and Past Admission  External records from outside source obtained and reviewed including medical history, surgical history, allergies, medications.  Previous visits with infectious disease reviewed during this visit. The patient was a reliable historian, providing a clear, detailed, and consistent account of the presenting symptoms and relevant medical history. The information was obtained directly from the patient and statements were documented in the patient's own words when possible. No discrepancies were noted between  the history provided and available collateral sources.     Lab Tests: I ordered, and personally interpreted labs.   The pertinent results include:  CMP no acute findings CBC -leukocytosis, hemoglobin 10.2 CRP -1.6 Lactic acid -2.3 Urinalysis -  positive Sed rate - 23  Imaging Studies ordered: I ordered imaging studies including: MRI pelvis without contrast I independently visualized and interpreted imaging which showed: Pending read  Cardiac Monitoring: The patient was maintained on a cardiac monitor.  I personally viewed and interpreted the cardiac monitored which showed an underlying rhythm of: Sinus rhythm  Medicines ordered and prescription drug management: I ordered medications: sodium chloride  0.9% bolus 1000 mL x 2 for rehydration Meropenem for infection I have reviewed the patients home medicines and have made adjustments as needed  Problem List / ED Course: Problem List: Hypotension Emergency Department Course: The patient presented with hypotension. Initial assessment included history, physical exam, and review of prior medical records. ECG was ordered and reviewed and negative for acute ischemia.  Laboratory studies were completed - CMP, CBC showed leukocytosis and neutrophilia, CRP elevated at 1.6, lactic acid elevated 2.3, sedimentation rate 23, urinalysis positive.  Patient was alert and oriented for the entirety of visit and responded well with initial fluid bolus, originally heart rate stayed in the 70s however during visit he was having heart rate sustained in the 90s and above meeting SIRS criteria.  Given the patient's current history sacral wound being followed by infectious disease, physical exam findings, vital trends, laboratory findings, and pending MRI interpretation, plan to admit patient for sepsis. Dispostion: After consideration of the diagnostic results and the patients response to treatment, I feel that the patent would benefit from admittance for further evaluation and care. Clinical Assessment:    Working diagnosis: Sepsis Disposition Plan: Admit to internal medicine for further management due to sepsis.      Final diagnoses:  Sepsis, due to unspecified organism, unspecified whether acute  organ dysfunction present [facility])    ED Discharge Orders     None          Willma Duwaine CROME, [place] 07/24/24 9345    Raford Lenis, MD 07/24/24 [phone]

## 2024-07-23 NOTE — ED Triage Notes (Signed)
 BIB [facility] from [facility] for hypotension.  Fatigued. Dizziness. 94/50 at PCP office. 100/50 after 1L PICC line for wound. A&O x 4. Parkinson's.

## 2024-07-24 ENCOUNTER — Emergency Department (HOSPITAL_COMMUNITY)

## 2024-07-24 ENCOUNTER — Encounter (HOSPITAL_COMMUNITY): Payer: Self-pay | Admitting: Internal Medicine

## 2024-07-24 DIAGNOSIS — Z872 Personal history of diseases of the skin and subcutaneous tissue: Secondary | ICD-10-CM

## 2024-07-24 DIAGNOSIS — I959 Hypotension, unspecified: Secondary | ICD-10-CM

## 2024-07-24 DIAGNOSIS — L89313 Pressure ulcer of right buttock, stage 3: Secondary | ICD-10-CM | POA: Diagnosis present

## 2024-07-24 DIAGNOSIS — D649 Anemia, unspecified: Secondary | ICD-10-CM | POA: Insufficient documentation

## 2024-07-24 DIAGNOSIS — F32A Depression, unspecified: Secondary | ICD-10-CM | POA: Diagnosis present

## 2024-07-24 DIAGNOSIS — A419 Sepsis, unspecified organism: Secondary | ICD-10-CM | POA: Diagnosis present

## 2024-07-24 DIAGNOSIS — Z8739 Personal history of other diseases of the musculoskeletal system and connective tissue: Secondary | ICD-10-CM

## 2024-07-24 DIAGNOSIS — E1169 Type 2 diabetes mellitus with other specified complication: Secondary | ICD-10-CM | POA: Diagnosis present

## 2024-07-24 DIAGNOSIS — F419 Anxiety disorder, unspecified: Secondary | ICD-10-CM | POA: Diagnosis present

## 2024-07-24 DIAGNOSIS — N401 Enlarged prostate with lower urinary tract symptoms: Secondary | ICD-10-CM

## 2024-07-24 DIAGNOSIS — I950 Idiopathic hypotension: Secondary | ICD-10-CM | POA: Diagnosis not present

## 2024-07-24 DIAGNOSIS — D509 Iron deficiency anemia, unspecified: Secondary | ICD-10-CM | POA: Diagnosis present

## 2024-07-24 DIAGNOSIS — L89154 Pressure ulcer of sacral region, stage 4: Secondary | ICD-10-CM

## 2024-07-24 DIAGNOSIS — E861 Hypovolemia: Secondary | ICD-10-CM | POA: Diagnosis not present

## 2024-07-24 DIAGNOSIS — E1142 Type 2 diabetes mellitus with diabetic polyneuropathy: Secondary | ICD-10-CM | POA: Diagnosis present

## 2024-07-24 DIAGNOSIS — E11628 Type 2 diabetes mellitus with other skin complications: Secondary | ICD-10-CM

## 2024-07-24 DIAGNOSIS — I10 Essential (primary) hypertension: Secondary | ICD-10-CM | POA: Insufficient documentation

## 2024-07-24 DIAGNOSIS — K219 Gastro-esophageal reflux disease without esophagitis: Secondary | ICD-10-CM | POA: Diagnosis present

## 2024-07-24 DIAGNOSIS — I9589 Other hypotension: Secondary | ICD-10-CM | POA: Diagnosis not present

## 2024-07-24 DIAGNOSIS — Z66 Do not resuscitate: Secondary | ICD-10-CM | POA: Diagnosis present

## 2024-07-24 DIAGNOSIS — G2581 Restless legs syndrome: Secondary | ICD-10-CM | POA: Diagnosis present

## 2024-07-24 DIAGNOSIS — M4628 Osteomyelitis of vertebra, sacral and sacrococcygeal region: Secondary | ICD-10-CM | POA: Diagnosis present

## 2024-07-24 DIAGNOSIS — N400 Enlarged prostate without lower urinary tract symptoms: Secondary | ICD-10-CM

## 2024-07-24 DIAGNOSIS — Y848 Other medical procedures as the cause of abnormal reaction of the patient, or of later complication, without mention of misadventure at the time of the procedure: Secondary | ICD-10-CM | POA: Diagnosis present

## 2024-07-24 DIAGNOSIS — R6521 Severe sepsis with septic shock: Secondary | ICD-10-CM | POA: Diagnosis present

## 2024-07-24 DIAGNOSIS — Y846 Urinary catheterization as the cause of abnormal reaction of the patient, or of later complication, without mention of misadventure at the time of the procedure: Secondary | ICD-10-CM | POA: Diagnosis present

## 2024-07-24 DIAGNOSIS — L89159 Pressure ulcer of sacral region, unspecified stage: Secondary | ICD-10-CM | POA: Diagnosis not present

## 2024-07-24 DIAGNOSIS — T83511A Infection and inflammatory reaction due to indwelling urethral catheter, initial encounter: Secondary | ICD-10-CM | POA: Diagnosis present

## 2024-07-24 DIAGNOSIS — T80211A Bloodstream infection due to central venous catheter, initial encounter: Secondary | ICD-10-CM | POA: Diagnosis present

## 2024-07-24 DIAGNOSIS — E119 Type 2 diabetes mellitus without complications: Secondary | ICD-10-CM | POA: Diagnosis not present

## 2024-07-24 DIAGNOSIS — G20A1 Parkinson's disease without dyskinesia, without mention of fluctuations: Secondary | ICD-10-CM | POA: Diagnosis present

## 2024-07-24 DIAGNOSIS — Z87891 Personal history of nicotine dependence: Secondary | ICD-10-CM | POA: Diagnosis not present

## 2024-07-24 DIAGNOSIS — G4700 Insomnia, unspecified: Secondary | ICD-10-CM | POA: Diagnosis present

## 2024-07-24 DIAGNOSIS — Z79899 Other long term (current) drug therapy: Secondary | ICD-10-CM | POA: Diagnosis not present

## 2024-07-24 DIAGNOSIS — Z7984 Long term (current) use of oral hypoglycemic drugs: Secondary | ICD-10-CM | POA: Diagnosis not present

## 2024-07-24 DIAGNOSIS — R339 Retention of urine, unspecified: Secondary | ICD-10-CM | POA: Diagnosis present

## 2024-07-24 DIAGNOSIS — G20C Parkinsonism, unspecified: Secondary | ICD-10-CM | POA: Diagnosis not present

## 2024-07-24 DIAGNOSIS — Z960 Presence of urogenital implants: Secondary | ICD-10-CM

## 2024-07-24 DIAGNOSIS — S31000A Unspecified open wound of lower back and pelvis without penetration into retroperitoneum, initial encounter: Secondary | ICD-10-CM

## 2024-07-24 DIAGNOSIS — Z8249 Family history of ischemic heart disease and other diseases of the circulatory system: Secondary | ICD-10-CM | POA: Diagnosis not present

## 2024-07-24 DIAGNOSIS — Z801 Family history of malignant neoplasm of trachea, bronchus and lung: Secondary | ICD-10-CM | POA: Diagnosis not present

## 2024-07-24 LAB — BASIC METABOLIC PANEL WITH GFR
Anion gap: 12 (ref 5–15)
BUN: 15 mg/dL (ref 8–23)
CO2: 26 mmol/L (ref 22–32)
Calcium: 8 mg/dL — ABNORMAL LOW (ref 8.9–10.3)
Chloride: 100 mmol/L (ref 98–111)
Creatinine, Ser: 0.86 mg/dL (ref 0.61–1.24)
GFR, Estimated: >60 mL/min (ref >60)
Glucose, Bld: 103 mg/dL — ABNORMAL HIGH (ref 70–99)
Potassium: 3.8 mmol/L (ref 3.5–5.1)
Sodium: 138 mmol/L (ref 135–145)

## 2024-07-24 LAB — LACTIC ACID, PLASMA
Lactic Acid, Venous: 1.9 mmol/L (ref 0.5–1.9)
Lactic Acid, Venous: 1.8 mmol/L (ref 0.5–1.9)

## 2024-07-24 LAB — I-STAT CG4 LACTIC ACID, ED: Lactic Acid, Venous: 2.3 mmol/L (ref 0.5–1.9)

## 2024-07-24 LAB — CBC WITH DIFFERENTIAL/PLATELET
Abs Immature Granulocytes: 0.07 K/uL (ref 0.00–0.07)
Basophils Absolute: 0.1 K/uL (ref 0.0–0.1)
Basophils Relative: 0 %
Eosinophils Absolute: 0.8 K/uL — ABNORMAL HIGH (ref 0.0–0.5)
Eosinophils Relative: 5 %
HCT: 31.8 % — ABNORMAL LOW (ref 39.0–52.0)
Hemoglobin: 9.7 g/dL — ABNORMAL LOW (ref 13.0–17.0)
Immature Granulocytes: 0 %
Lymphocytes Relative: 13 %
Lymphs Abs: 2 K/uL (ref 0.7–4.0)
MCH: 26.7 pg (ref 26.0–34.0)
MCHC: 30.5 g/dL (ref 30.0–36.0)
MCV: 87.6 fL (ref 80.0–100.0)
Monocytes Absolute: 1.2 K/uL — ABNORMAL HIGH (ref 0.1–1.0)
Monocytes Relative: 8 %
Neutro Abs: 11.6 K/uL — ABNORMAL HIGH (ref 1.7–7.7)
Neutrophils Relative %: 74 %
Platelets: 437 K/uL — ABNORMAL HIGH (ref 150–400)
RBC: 3.63 MIL/uL — ABNORMAL LOW (ref 4.22–5.81)
RDW: 15.8 % — ABNORMAL HIGH (ref 11.5–15.5)
WBC: 15.7 K/uL — ABNORMAL HIGH (ref 4.0–10.5)
nRBC: 0 % (ref 0.0–0.2)

## 2024-07-24 LAB — HEPATIC FUNCTION PANEL
ALT: 22 U/L (ref 0–44)
AST: 14 U/L — ABNORMAL LOW (ref 15–41)
Albumin: 2.2 g/dL — ABNORMAL LOW (ref 3.5–5.0)
Alkaline Phosphatase: 69 U/L (ref 38–126)
Bilirubin, Direct: 0.1 mg/dL (ref 0.0–0.2)
Indirect Bilirubin: 0.4 mg/dL (ref 0.3–0.9)
Total Bilirubin: 0.5 mg/dL (ref 0.0–1.2)
Total Protein: 5.3 g/dL — ABNORMAL LOW (ref 6.5–8.1)

## 2024-07-24 LAB — URINALYSIS, ROUTINE W REFLEX MICROSCOPIC
Bilirubin Urine: NEGATIVE
Glucose, UA: NEGATIVE mg/dL
Ketones, ur: 5 mg/dL — AB
Nitrite: POSITIVE — AB
Protein, ur: 100 mg/dL — AB
RBC / HPF: >50 RBC/hpf (ref 0–5)
Specific Gravity, Urine: 1.025 (ref 1.005–1.030)
WBC, UA: >50 WBC/hpf (ref 0–5)
pH: 6 (ref 5.0–8.0)

## 2024-07-24 LAB — GLUCOSE, CAPILLARY
Glucose-Capillary: 89 mg/dL (ref 70–99)
Glucose-Capillary: 107 mg/dL — ABNORMAL HIGH (ref 70–99)
Glucose-Capillary: 81 mg/dL (ref 70–99)

## 2024-07-24 LAB — SEDIMENTATION RATE: Sed Rate: 23 mm/h — ABNORMAL HIGH (ref 0–16)

## 2024-07-24 LAB — HEMOGLOBIN A1C
Hgb A1c MFr Bld: 5.6 % (ref 4.8–5.6)
Mean Plasma Glucose: 114.02 mg/dL

## 2024-07-24 LAB — LITHIUM LEVEL: Lithium Lvl: <0.1 mmol/L — ABNORMAL LOW (ref 0.60–1.20)

## 2024-07-24 LAB — CBG MONITORING, ED: Glucose-Capillary: 203 mg/dL — ABNORMAL HIGH (ref 70–99)

## 2024-07-24 LAB — C-REACTIVE PROTEIN: CRP: 1.6 mg/dL — ABNORMAL HIGH (ref <1.0)

## 2024-07-24 MED ORDER — HEPARIN SODIUM (PORCINE) 5000 UNIT/ML IJ SOLN
5000.0000 [IU] | Freq: Three times a day (TID) | INTRAMUSCULAR | Status: DC
  Administered 2024-07-24 – 2024-07-25 (×4): 5000 [IU] via SUBCUTANEOUS
  Filled 2024-07-24 (×4): qty 1

## 2024-07-24 MED ORDER — BUPROPION HCL ER (XL) 300 MG PO TB24
300.0000 mg | ORAL_TABLET | Freq: Every day | ORAL | Status: DC
  Administered 2024-07-24 – 2024-07-29 (×6): 300 mg via ORAL
  Filled 2024-07-24 (×2): qty 2
  Filled 2024-07-24: qty 1
  Filled 2024-07-24 (×2): qty 2
  Filled 2024-07-24: qty 1

## 2024-07-24 MED ORDER — SODIUM CHLORIDE 0.9 % IV BOLUS
1000.0000 mL | Freq: Once | INTRAVENOUS | Status: AC
  Administered 2024-07-24: 1000 mL via INTRAVENOUS

## 2024-07-24 MED ORDER — CHLORHEXIDINE GLUCONATE CLOTH 2 % EX PADS
6.0000 | MEDICATED_PAD | Freq: Every day | CUTANEOUS | Status: DC
  Administered 2024-07-24 – 2024-07-29 (×6): 6 via TOPICAL

## 2024-07-24 MED ORDER — PANTOPRAZOLE SODIUM 40 MG PO TBEC
40.0000 mg | DELAYED_RELEASE_TABLET | Freq: Every day | ORAL | Status: DC
  Administered 2024-07-24 – 2024-07-29 (×6): 40 mg via ORAL
  Filled 2024-07-24 (×6): qty 1

## 2024-07-24 MED ORDER — TAMSULOSIN HCL 0.4 MG PO CAPS
0.4000 mg | ORAL_CAPSULE | Freq: Every day | ORAL | Status: DC
  Administered 2024-07-24 – 2024-07-28 (×5): 0.4 mg via ORAL
  Filled 2024-07-24 (×5): qty 1

## 2024-07-24 MED ORDER — CLONAZEPAM 0.5 MG PO TABS
0.5000 mg | ORAL_TABLET | Freq: Two times a day (BID) | ORAL | Status: DC | PRN
  Administered 2024-07-24: 0.5 mg via ORAL
  Filled 2024-07-24: qty 1

## 2024-07-24 MED ORDER — SODIUM CHLORIDE 0.9% FLUSH: 10.0000 mL | INTRAVENOUS | Status: DC | PRN

## 2024-07-24 MED ORDER — INSULIN ASPART 100 UNIT/ML IJ SOLN
0.0000 [IU] | Freq: Three times a day (TID) | INTRAMUSCULAR | Status: DC
  Administered 2024-07-24: 3 [IU] via SUBCUTANEOUS
  Administered 2024-07-26: 1 [IU] via SUBCUTANEOUS
  Administered 2024-07-27: 2 [IU] via SUBCUTANEOUS
  Administered 2024-07-28: 1 [IU] via SUBCUTANEOUS
  Administered 2024-07-28: 2 [IU] via SUBCUTANEOUS
  Administered 2024-07-28: 1 [IU] via SUBCUTANEOUS
  Filled 2024-07-24: qty 1
  Filled 2024-07-24: qty 2
  Filled 2024-07-24: qty 1

## 2024-07-24 MED ORDER — SODIUM CHLORIDE 0.9 % IV SOLN
1.0000 g | Freq: Three times a day (TID) | INTRAVENOUS | Status: DC
  Administered 2024-07-24 – 2024-07-26 (×7): 1 g via INTRAVENOUS
  Filled 2024-07-24 (×9): qty 20

## 2024-07-24 MED ORDER — MIRTAZAPINE 15 MG PO TABS
15.0000 mg | ORAL_TABLET | Freq: Every day | ORAL | Status: DC
  Administered 2024-07-24 – 2024-07-28 (×5): 15 mg via ORAL
  Filled 2024-07-24 (×5): qty 1

## 2024-07-24 MED ORDER — LITHIUM CARBONATE 150 MG PO CAPS
150.0000 mg | ORAL_CAPSULE | Freq: Every day | ORAL | Status: DC
  Administered 2024-07-24 – 2024-07-29 (×6): 150 mg via ORAL
  Filled 2024-07-24 (×6): qty 1

## 2024-07-24 MED ORDER — COLLAGENASE 250 UNIT/GM EX OINT
TOPICAL_OINTMENT | Freq: Every day | CUTANEOUS | Status: DC
  Filled 2024-07-24 (×2): qty 30

## 2024-07-24 MED ORDER — LACTATED RINGERS IV SOLN: INTRAVENOUS | Status: AC

## 2024-07-24 MED ORDER — DUTASTERIDE 0.5 MG PO CAPS
0.5000 mg | ORAL_CAPSULE | Freq: Every day | ORAL | Status: DC
  Administered 2024-07-24 – 2024-07-29 (×6): 0.5 mg via ORAL
  Filled 2024-07-24 (×6): qty 1

## 2024-07-24 MED ORDER — PREGABALIN 75 MG PO CAPS
75.0000 mg | ORAL_CAPSULE | Freq: Two times a day (BID) | ORAL | Status: DC
  Administered 2024-07-24 – 2024-07-29 (×11): 75 mg via ORAL
  Filled 2024-07-24 (×10): qty 1
  Filled 2024-07-24: qty 3

## 2024-07-24 MED ORDER — ACETAMINOPHEN 325 MG PO TABS
650.0000 mg | ORAL_TABLET | Freq: Four times a day (QID) | ORAL | Status: DC | PRN
  Administered 2024-07-26 – 2024-07-28 (×3): 650 mg via ORAL
  Filled 2024-07-24 (×3): qty 2

## 2024-07-24 MED ORDER — SODIUM CHLORIDE 0.9% FLUSH
10.0000 mL | Freq: Two times a day (BID) | INTRAVENOUS | Status: DC
  Administered 2024-07-24 – 2024-07-29 (×11): 10 mL

## 2024-07-24 MED ORDER — DULOXETINE HCL 60 MG PO CPEP
120.0000 mg | ORAL_CAPSULE | Freq: Every day | ORAL | Status: DC
  Administered 2024-07-24 – 2024-07-28 (×5): 120 mg via ORAL
  Filled 2024-07-24 (×5): qty 2

## 2024-07-24 MED ORDER — DAKINS (1/4 STRENGTH) 0.125 % EX SOLN
Freq: Every day | CUTANEOUS | Status: AC
  Administered 2024-07-25: 1
  Filled 2024-07-24 (×2): qty 473

## 2024-07-24 MED ORDER — CARBIDOPA-LEVODOPA 25-100 MG PO TABS
1.0000 | ORAL_TABLET | Freq: Three times a day (TID) | ORAL | Status: DC
Start: 2024-07-24 — End: 2024-07-29
  Administered 2024-07-24 – 2024-07-29 (×16): 1 via ORAL
  Filled 2024-07-24 (×17): qty 1

## 2024-07-24 MED ORDER — ZINC OXIDE 40 % EX OINT
TOPICAL_OINTMENT | Freq: Two times a day (BID) | CUTANEOUS | Status: DC
  Filled 2024-07-24 (×3): qty 57

## 2024-07-24 MED ORDER — ACETAMINOPHEN 650 MG RE SUPP: 650.0000 mg | Freq: Four times a day (QID) | RECTAL | Status: DC | PRN

## 2024-07-24 MED ORDER — LINEZOLID 600 MG/300ML IV SOLN
600.0000 mg | Freq: Two times a day (BID) | INTRAVENOUS | Status: DC
Start: 2024-07-24 — End: 2024-08-02
  Administered 2024-07-24 – 2024-07-27 (×8): 600 mg via INTRAVENOUS
  Filled 2024-07-24 (×8): qty 300

## 2024-07-24 NOTE — ED Notes (Signed)
 Pt arrived with urinary cath in place. Changed out cath at this time with sterile technique. New 22 french cath placed without difficulty urine return placed in balloon. Secured to right leg.

## 2024-07-24 NOTE — Consult Note (Addendum)
 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  Using a Q tip applicator insert Dakins moistened gauze into wound bed daily x 3 days, cover with dry gauze/ABD pad and tape.   Will order Desitin for surrounding skin 2 times daily and prn soiling. Would attempt to keep silicone foam off due to moisture.   Patient should be placed on a low air loss mattress for pressure redistribution and moisture management.  After 3 days of Dakins will resume Santyl  07/27/2024 for continued debridement.   POC discussed with bedside nurse. WOC team will not follow. Re-consult if further needs arise.   Thank you,    Powell Bar MSN, RN-BC, [employer]

## 2024-07-24 NOTE — Progress Notes (Signed)
 Douglas Price is a 73 y.o. male with history of Parkinson's disease, diabetes mellitus type 2, hypertension, depression, chronic indwelling Foley catheter was referred to ER from infectious disease office after patient was found to be hypotensive and diaphoretic.  Patient was recently admitted to the hospital in August 2025 for infected decubitus ulcer with Proteus mirabilis bacteremia was on oral antibiotics.  During the stay at skilled nursing facility patient was placed on meropenem and Zyvox on 07/15/2024 and was following up with infectious disease yesterday when patient became hypotensive at office.  Was referred to the ER.  Denied any chest pain shortness of breath nausea vomiting or diarrhea.  Patient was admitted under hospitalist service just few hours ago.  I have reviewed H&P, vitals as well as labs thoroughly.  I saw patient in the ED.  Patient kept his eyes closed but he was fully oriented and was able to answer all the questions.  He denied having any complaints.  The reason for sending him to the ED by ID was concern for possible sepsis.  Patient afebrile with leukocytosis which does not appear to be much different from what his white cells were about a month ago.  Blood cultures have been drawn.  MRI pelvis was done which once again showed sacral decubitus ulcer with osteomyelitis which is known to us .  Patient has been resumed on Merrem and Zyvox.  I have personally consulted ID.  On my examination, patient had clear lungs to auscultation, abdomen was soft and nontender.  Neuro intact.  However I did notice that patient was having left lower extremity erythema which was warmer to touch but nontender.  No open sores.  Please see picture below.  I wonder if cellulitis is likely the source of possible sepsis as patient did present with tachycardia and hypotension.  Hypotension however has improved after patient has received 2 L of IV fluid bolus.  He is continuing on gentle IV hydration for now.   Further management per ID. I have spent a total of 45 minutes reviewing the chart, examining the patient, coordinating with consultants.

## 2024-07-24 NOTE — Consult Note (Signed)
 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 Dennise is here this weekend and will check in on the patient.  Principal Problem:   Hypotension Active Problems:   Parkinson's disease (HCC)   Depression   BPH (benign prostatic hyperplasia)   GERD (gastroesophageal reflux disease)   Acute urinary retention   Sacral wound   Diabetes mellitus type 2 in nonobese (HCC)   Anemia   Essential hypertension   Scheduled Meds:  buPROPion   300 mg Oral Daily   carbidopa -levodopa   1 tablet Oral TID WC   Chlorhexidine  Gluconate Cloth  6 each Topical Daily   [START ON 07/27/2024] collagenase    Topical Daily   DULoxetine   120 mg Oral QHS   dutasteride   0.5 mg Oral Daily   heparin   5,000 Units Subcutaneous Q8H   insulin  aspart  0-9 Units Subcutaneous TID WC   lithium  carbonate  150 mg Oral Daily   liver oil-zinc  oxide   Topical BID   mirtazapine   15 mg Oral QHS   pantoprazole   40 mg Oral Daily   pregabalin   75 mg Oral BID   sodium chloride  flush  10-40 mL Intracatheter Q12H   sodium hypochlorite   Irrigation Daily   tamsulosin   0.4 mg Oral QHS   Continuous Infusions:  lactated ringers  125 mL/hr at 07/24/24 0703   linezolid (ZYVOX) IV 600 mg (07/24/24 1054)   meropenem (MERREM) IV Stopped (07/24/24 0742)   PRN Meds:.acetaminophen  **OR** acetaminophen , clonazePAM , sodium chloride  flush  HPI: Douglas Price is a 73 y.o. male past medical history significant for Parkinson's disease diabetes mellitus depression chronic indwelling Foley catheter who had a Proteus mirabilis bacteremia diagnosed in August with source having been believed to  be has stage IV decubitus ulcer sacrococcygeal osteomyelitis seen on imaging in August.  He was given IV therapy and then discharged on oral doxycycline  and Augmentin  with plans to complete 6 weeks on September 11.   For unknown reasons patient was started on meropenem in SNF and I believe zyvox though we are to investigate this.  When he presented to clinic yesterday he was found to be hypotensive and diaphoretic.  When asked about yesterday's events he could not recall the visit with my partner Dr. Dennise.  He has had other episodes of hypotension over the years mentioning that he can drop into the 60s  att times.   In the ER urine analysis was performed which showed greater than 50 white blood cells and some rare bacteria.  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  Cultures had been taken which are incubating.  Seen pictures taken of his decubitus ulcer it does not appear overtly purulent.  An MRI of the area is pending that Dr. Dennise has ordered and has now been read  I performed earlier this morning continues to show a large decubitus ulcer along the right posterior sacrococcygeal margin with low-level marrow edema in the sacral and coccyx as well as low-level edema in the posterior  paraspinal musculature without a drainable abscess.  These findings do NOT appear to be giving us  much new data that would compel me to give him a protracted course of antibiotics.    For now we will continue with his current antibiotics of meropenem and Zyvox.  It is quite curious that he would become septic while on this broad of an antibiotic regimen.  He does have some erythema his lower extremities but I do not believe this is cellulitis and makes no sense in the context of the current antibiotics.  His PICC line could be infected though overtly it does not  appear to be so.    I personally spent a total of 81 minutes in the care of the patient today including preparing to see the patient, getting/reviewing separately obtained history, performing a medically appropriate exam/evaluation, counseling and educating, referring and communicating with other health care professionals, documenting clinical information in the EHR, independently interpreting results, communicating results, and coordinating care.   Evaluation of the patient requires complex antimicrobial therapy evaluation, counseling , isolation needs to reduce disease transmission and risk assessment and mitigation.     Review of Systems: Review of Systems  Unable to perform ROS: Acuity of condition    Past Medical History:  Diagnosis Date   Allergic rhinitis due to pollen    Benign neoplasm of colon    Blisters with epidermal loss due to burn (second degree) of foot    Complication of anesthesia    pt woke up during saphenous vein surgery-had epidural   Constipation due to pain medication    Degeneration of lumbar or lumbosacral intervertebral disc    Diverticulosis of colon (without mention of hemorrhage)    DM (diabetes mellitus) (HCC)    Esophageal reflux    Hypertrophy of prostate with urinary obstruction and other lower urinary tract symptoms (LUTS)    Loss of weight    Neuromuscular disorder (HCC)    peripheral neuropathy   Obesity, unspecified    Pain in limb    Restless legs syndrome (RLS)    Sleep disturbance 03/24/2014   Spinal stenosis, unspecified region other than cervical    Tobacco use disorder    Unspecified disease of pericardium    Unspecified essential hypertension    Unspecified local infection of skin and subcutaneous tissue    Varices of other sites     Social History   Tobacco Use   Smoking status: Some Days    Types: Pipe   Smokeless tobacco: Never   Tobacco comments:    moderate, 1-2bowls a day or more  Vaping Use   Vaping status:  Never Used  Substance Use Topics   Alcohol use: No    Alcohol/week: 0.0 standard drinks of alcohol   Drug use: No    Family History  Problem Relation Age of Onset   Heart failure Mother    Lung cancer Father    Neuropathy Brother    Hypertension Paternal Grandfather    Tuberculosis Maternal Uncle    Cancer - Colon Maternal Uncle    Allergies  Allergen Reactions   Avelox [Moxifloxacin] Other (See Comments)    Angioedema Fatigue   Quinolones Other (See Comments)    Angioedema   Lexapro [Escitalopram] Other (See Comments)    unknown    OBJECTIVE: Blood pressure (!) 147/80, pulse 72, temperature 98.4 F (36.9 C), temperature source Oral, resp. rate 14, SpO2 99%.  Physical Exam Constitutional:      Appearance: He is well-developed.  HENT:     Head: Normocephalic and atraumatic.     Comments: He has some ecchymosis around the left eye where he had fallen Eyes:     Conjunctiva/sclera: Conjunctivae normal.  Cardiovascular:     Rate and Rhythm: Normal rate and regular rhythm.  Pulmonary:     Effort: Pulmonary effort is normal. No respiratory distress.     Breath sounds: No wheezing.  Abdominal:     General: There is no distension.     Palpations: Abdomen is soft.  Musculoskeletal:     Cervical back: Normal range of motion and neck supple.  Skin:    General: Skin is warm and dry.     Coloration: Skin is not pale.     Findings: No erythema or rash.  Neurological:     Mental Status: He is alert and oriented to person, place, and time.     Comments: tremors  Psychiatric:        Mood and Affect: Mood normal.        Behavior: Behavior normal.        Thought Content: Thought content normal.        Judgment: Judgment normal.     Legs        PICC line is clean  Decubitus ulcer:    Lab Results Lab Results  Component Value Date   WBC 15.7 (H) 07/24/2024   HGB 9.7 (L) 07/24/2024   HCT 31.8 (L) 07/24/2024   MCV 87.6 07/24/2024   PLT 437 (H) 07/24/2024     Lab Results  Component Value Date   CREATININE 0.86 07/24/2024   BUN 15 07/24/2024   NA 138 07/24/2024   K 3.8 07/24/2024   CL 100 07/24/2024   CO2 26 07/24/2024    Lab Results  Component Value Date   ALT 22 07/24/2024   AST 14 (L) 07/24/2024   ALKPHOS 69 07/24/2024   BILITOT 0.5 07/24/2024     Microbiology: Recent Results (from the past 240 hours)  Culture, blood (single) w Reflex to ID Panel     Status: None (Preliminary result)   Collection Time: 07/23/24  4:32 AM   Specimen: Vein; Blood  Result Value Ref Range Status   MICRO NUMBER: [id number]  Preliminary   SPECIMEN QUALITY: Adequate  Preliminary   Source BLOOD 2  Preliminary   STATUS: PRELIMINARY  Preliminary   Result:   Preliminary    No growth to date. Culture is continuously monitored for a total of 120 hours incubation. A change in status will result in a phone report followed by an updated printed culture report.   COMMENT: Aerobic and anaerobic bottle received.  Preliminary  Culture, blood (single) w Reflex to ID Panel     Status: None (Preliminary result)   Collection Time: 07/23/24  4:23 PM   Specimen: Vein; Blood  Result Value Ref Range Status   MICRO NUMBER: [id number]  Preliminary   SPECIMEN QUALITY: Adequate  Preliminary   Source BLOOD 1  Preliminary   STATUS: PRELIMINARY  Preliminary   Result:   Preliminary    No growth to date. Culture is continuously monitored for a total of 120 hours incubation. A change in status will result in a phone report followed by an updated printed culture report.   COMMENT: Aerobic and anaerobic bottle received.  Preliminary  Blood culture (routine x 2)     Status: None (Preliminary result)   Collection Time: 07/24/24  2:09 AM   Specimen: BLOOD LEFT ARM  Result Value Ref Range Status   Specimen Description BLOOD LEFT ARM  Final   Special Requests   Final    BOTTLES DRAWN AEROBIC AND ANAEROBIC Blood Culture adequate volume   Culture   Final    NO GROWTH < 12  HOURS Performed at [facility], [address] [place], [place] [address]    Report Status PENDING  Incomplete  Blood culture (routine x 2)     Status: None (Preliminary result)   Collection Time: 07/24/24  2:10 AM   Specimen: BLOOD LEFT ARM  Result Value Ref Range Status   Specimen Description BLOOD LEFT ARM  Final   Special Requests   Final    BOTTLES DRAWN AEROBIC AND ANAEROBIC Blood Culture adequate volume   Culture   Final    NO GROWTH < 12 HOURS Performed at [facility], [address], [place], [place] [address]    Report Status PENDING  Incomplete    Jomarie Fleeta Rothman, MD [facility] [phone] pager  07/24/2024, 11:39 AM

## 2024-07-24 NOTE — H&P (Addendum)
 History and Physical    Douglas Price FMW:[id number] DOB: 06/18/1951 DOA: 07/23/2024  Patient coming from: Skilled nursing facility.  Chief Complaint: Hypotension.  HPI: Douglas Price is a 73 y.o. male with history of Parkinson's disease, diabetes mellitus type 2, hypertension, depression, chronic indwelling Foley catheter was referred to ER from infectious disease office after patient was found to be hypotensive and diaphoretic.  Patient was recently admitted to the hospital in August 2025 for infected decubitus ulcer with Proteus mirabilis bacteremia was on oral antibiotics.  During the stay at skilled nursing facility patient was placed on meropenem and Zyvox and was following up with infectious disease when patient became hypotensive at office.  Was referred to the ER.  Denies any chest pain shortness of breath nausea vomiting or diarrhea.  ED Course: In the ER patient was initially hypotensive with systolic in the 80s improved with fluids.  Temperature was 99 F.  Labs showed WBC of 18.6 hemoglobin 10.2 platelets 440 lactic acid 2.3 creatinine 1.  Patient was started on empiric antibiotics after cultures obtained.  UA is concerning for UTI.  Patient's Foley catheter also is being replaced.  MRI of the pelvis has been ordered which is pending.  Patient admitted for infected decubitus ulcer with concern for sepsis.  Review of Systems: As per HPI, rest all negative.   Past Medical History:  Diagnosis Date   Allergic rhinitis due to pollen    Benign neoplasm of colon    Blisters with epidermal loss due to burn (second degree) of foot    Complication of anesthesia    pt woke up during saphenous vein surgery-had epidural   Constipation due to pain medication    Degeneration of lumbar or lumbosacral intervertebral disc    Diverticulosis of colon (without mention of hemorrhage)    DM (diabetes mellitus) (HCC)    Esophageal reflux    Hypertrophy of prostate with urinary obstruction and  other lower urinary tract symptoms (LUTS)    Loss of weight    Neuromuscular disorder (HCC)    peripheral neuropathy   Obesity, unspecified    Pain in limb    Restless legs syndrome (RLS)    Sleep disturbance 03/24/2014   Spinal stenosis, unspecified region other than cervical    Tobacco use disorder    Unspecified disease of pericardium    Unspecified essential hypertension    Unspecified local infection of skin and subcutaneous tissue    Varices of other sites     Past Surgical History:  Procedure Laterality Date   BACK SURGERY     CATARACT EXTRACTION Bilateral    one in july and other in Aug.2023   COLONOSCOPY W/ POLYPECTOMY     CYST EXCISION Left 04/09/2022   Procedure: LEFT INDEX FINGER MUCOID CYST EXCISION;  Surgeon: Murrell Drivers, MD;  Location: [facility];  Service: Orthopedics;  Laterality: Left;  Bier block   CYST EXCISION Left 05/02/2023   Procedure: LEFT INDEX FINGER MUCOID CYST EXCISION AND DISTAL INTERPHALANGEAL JOINT DEBRIDEMENT;  Surgeon: Murrell Drivers, MD;  Location: [facility];  Service: Orthopedics;  Laterality: Left;   LUMBAR LAMINECTOMY/DECOMPRESSION MICRODISCECTOMY Bilateral 09/08/2015   Procedure: Laminectomy and Foraminotomy - Lumbar two-lumbar three bilateral;  Surgeon: Alm GORMAN Molt, MD;  Location: [facility];  Service: Neurosurgery;  Laterality: Bilateral;   SAPHENOUS VEIN GRAFT RESECTION Right 09/24/1989   TENDON EXPLORATION Left 04/09/2022   Procedure: DEBRIDEMENT DISTAL INTERPHALANGEAL JOINT LEFT INDEX FINGER;  Surgeon: Murrell Drivers, MD;  Location: [facility];  Service: Orthopedics;  Laterality: Left;  Bier block   TONSILLECTOMY     TRIGGER FINGER RELEASE Left 05/02/2023   Procedure: LEFT RING FINGER TRIGGER RELEASE;  Surgeon: Murrell Drivers, MD;  Location: [facility];  Service: Orthopedics;  Laterality: Left;     reports that he has been smoking pipe. He has never used smokeless tobacco. He  reports that he does not drink alcohol and does not use drugs.  Allergies  Allergen Reactions   Avelox [Moxifloxacin] Other (See Comments)    Angioedema Fatigue   Quinolones Other (See Comments)    Angioedema   Lexapro [Escitalopram] Other (See Comments)    unknown    Family History  Problem Relation Age of Onset   Heart failure Mother    Lung cancer Father    Neuropathy Brother    Hypertension Paternal Grandfather    Tuberculosis Maternal Uncle    Cancer - Colon Maternal Uncle     Prior to Admission medications   Medication Sig Start Date End Date Taking? Authorizing Provider  bisacodyl  (DULCOLAX) 5 MG EC tablet Take 2 tablets (10 mg total) by mouth daily as needed for moderate constipation. 04/27/24  Yes Amin, Ankit C, MD  DULoxetine  (CYMBALTA ) 60 MG capsule Take 120 mg by mouth at bedtime.   Yes [provider]  dutasteride  (AVODART ) 0.5 MG capsule Take 0.5 mg by mouth daily.   Yes [provider]  heparin  lock flush 100 UNIT/ML SOLN injection Inject 300 Units into the vein every 8 (eight) hours. Use 3ml intravenously every 8 hours to maintain patency.   Yes [provider]  linezolid (ZYVOX) 600 MG tablet Take 600 mg by mouth 2 (two) times daily.   Yes [provider]  lithium  carbonate 150 MG capsule TAKE 1 CAPSULE(150 MG) BY MOUTH EVERY EVENING Patient taking differently: Take 150 mg by mouth in the morning. 02/18/24  Yes Hurst, Teresa T, PA-C  metFORMIN  (GLUCOPHAGE ) 500 MG tablet Take 1,000 mg by mouth 2 (two) times daily with a meal.   Yes [provider]  mirtazapine  (REMERON ) 15 MG tablet Take 15 mg by mouth at bedtime.   Yes [provider]  Multiple Vitamins-Minerals (DECUBI-VITE) CAPS Take 1 capsule by mouth in the morning.   Yes [provider]  nebivolol  (BYSTOLIC ) 2.5 MG tablet Take 2.5 mg by mouth daily.   Yes [provider]  nutrition supplement, JUVEN, (JUVEN) PACK Take 1 packet by mouth 2  (two) times daily between meals. Patient taking differently: Take 1 packet by mouth 2 (two) times daily between meals. Give one packet by mouth two times a day between meals for protein supplement. Give at 1000 and 1800. 04/27/24  Yes Amin, Ankit C, MD  omeprazole (PRILOSEC) 20 MG capsule Take 20 mg by mouth 2 (two) times daily before a meal.    Yes [provider]  Pollen Extracts (PROSTAT PO) Take 30 mLs by mouth 2 (two) times daily between meals. Give 30ml by mouth twice a day at 1000 and 1800 for nutrition support and wound healing.   Yes [provider]  polyethylene glycol (MIRALAX  / GLYCOLAX ) 17 g packet Take 17 g by mouth daily.   Yes [provider]  pregabalin  (LYRICA ) 75 MG capsule Take 1 capsule (75 mg total) by mouth 2 (two) times daily. 02/27/24  Yes Patsy Lenis, MD  tamsulosin  (FLOMAX ) 0.4 MG CAPS capsule Take 0.4 mg by mouth at bedtime.  Yes [provider]  telmisartan  (MICARDIS ) 40 MG tablet Take 40 mg by mouth daily.   Yes [provider]  acetaminophen  (TYLENOL ) 325 MG tablet Take 650 mg by mouth every 6 (six) hours as needed.    [provider]  buPROPion  (WELLBUTRIN  XL) 300 MG 24 hr tablet TAKE 1 TABLET(300 MG) BY MOUTH DAILY 02/18/24   Rhys Boyer T, PA-C  carbidopa -levodopa  (SINEMET  IR) 25-100 MG tablet Take 1 tablet by mouth 3 (three) times daily. Patient taking differently: Take 1 tablet by mouth 3 (three) times daily. Give one tablet by mouth three time a day at 1000, 1400, and 2200. 02/27/24   Patsy Lenis, MD  clonazePAM  (KLONOPIN ) 0.5 MG tablet Take 1 tablet (0.5 mg total) by mouth 2 (two) times daily as needed for anxiety. 06/04/24   Amin, Saad, MD  collagenase  (SANTYL ) 250 UNIT/GM ointment Apply topically daily. 04/28/24   Amin, Ankit C, MD  feeding supplement (ENSURE PLUS HIGH PROTEIN) LIQD Take 237 mLs by mouth 2 (two) times daily between meals. 04/27/24   Amin, Ankit C, MD  hydrocortisone  cream 1 % Apply topically 2  (two) times daily. 04/27/24   Amin, Ankit C, MD  liver oil-zinc  oxide (DESITIN) 40 % ointment Apply topically 2 (two) times daily. 04/27/24   Amin, Ankit C, MD  loratadine (CLARITIN) 10 MG tablet Take 10 mg by mouth daily as needed for allergies, rhinitis or itching.    [provider]  magnesium  hydroxide (MILK OF MAGNESIA) 400 MG/5ML suspension Take 30 mLs by mouth daily as needed for mild constipation.    [provider]  meropenem (MERREM) IVPB Inject 1 g into the vein every 8 (eight) hours.    [provider]  oxyCODONE  (OXY IR/ROXICODONE ) 5 MG immediate release tablet Take 1-2 tablets (5-10 mg total) by mouth every 4 (four) hours as needed for moderate pain (pain score 4-6). 04/27/24   Caleen Burgess BROCKS, MD  SODIUM PHOSPHATES RE Place rectally.    [provider]  temazepam  (RESTORIL ) 15 MG capsule Take 1 capsule (15 mg total) by mouth at bedtime as needed for sleep. Patient not taking: Reported on 06/25/2024 04/27/24   Caleen Burgess BROCKS, MD    Physical Exam: Constitutional: Moderately built and nourished. Vitals:   07/24/24 0245 07/24/24 0300 07/24/24 0315 07/24/24 0417  BP: 132/82 132/73 119/66   Pulse: 99 100 98   Resp: 14 14 14    Temp:    99.3 F (37.4 C)  TempSrc:    Oral  SpO2: 97% 97% 96%    Eyes: Anicteric no pallor. ENMT: No discharge from the ears eyes nose or mouth. Neck: No mass felt.  No neck rigidity. Respiratory: No rhonchi or crepitations. Cardiovascular: S1-S2 heard. Abdomen: Soft nontender bowel sound present. Musculoskeletal: No edema. Skin: Stage IV sacral decubitus. Neurologic: Alert awake oriented time place and person.  Moving all extremities. Psychiatric: Appears normal.  Normal affect.   Labs on Admission: I have personally reviewed following labs and imaging studies  CBC: Recent Labs  Lab 07/23/24 1623 07/23/24 2250  WBC 16.9* 18.6*  NEUTROABS 12,996* 14.8*  HGB 11.2* 10.2*  HCT 35.5* 33.0*  MCV 84.9 88.2  PLT 486* 440*    Basic Metabolic Panel: Recent Labs  Lab 07/23/24 1623 07/23/24 2250  NA 139 139  K 4.3 4.1  CL 100 101  CO2 28 27  GLUCOSE 82 108*  BUN 15 16  CREATININE 0.65* 1.09  CALCIUM 8.6 7.9*   GFR: CrCl  cannot be calculated (Unknown ideal weight.). Liver Function Tests: Recent Labs  Lab 07/23/24 1623 07/23/24 2250  AST 12 15  ALT 16 22  ALKPHOS  --  71  BILITOT 0.3 0.4  PROT 5.8* 5.5*  ALBUMIN   --  2.3*   No results for input(s): LIPASE, AMYLASE in the last 168 hours. No results for input(s): AMMONIA in the last 168 hours. Coagulation Profile: No results for input(s): INR, PROTIME in the last 168 hours. Cardiac Enzymes: No results for input(s): CKTOTAL, CKMB, CKMBINDEX, TROPONINI in the last 168 hours. BNP (last 3 results) No results for input(s): PROBNP in the last 8760 hours. HbA1C: No results for input(s): HGBA1C in the last 72 hours. CBG: No results for input(s): GLUCAP in the last 168 hours. Lipid Profile: No results for input(s): CHOL, HDL, LDLCALC, TRIG, CHOLHDL, LDLDIRECT in the last 72 hours. Thyroid Function Tests: No results for input(s): TSH, T4TOTAL, FREET4, T3FREE, THYROIDAB in the last 72 hours. Anemia Panel: No results for input(s): VITAMINB12, FOLATE, FERRITIN, TIBC, IRON, RETICCTPCT in the last 72 hours. Urine analysis:    Component Value Date/Time   COLORURINE AMBER (A) 07/24/2024 0018   APPEARANCEUR CLOUDY (A) 07/24/2024 0018   LABSPEC 1.025 07/24/2024 0018   PHURINE 6.0 07/24/2024 0018   GLUCOSEU NEGATIVE 07/24/2024 0018   HGBUR MODERATE (A) 07/24/2024 0018   BILIRUBINUR NEGATIVE 07/24/2024 0018   KETONESUR 5 (A) 07/24/2024 0018   PROTEINUR 100 (A) 07/24/2024 0018   UROBILINOGEN 0.2 06/03/2013 1559   NITRITE POSITIVE (A) 07/24/2024 0018   LEUKOCYTESUR LARGE (A) 07/24/2024 0018   Sepsis Labs: @LABRCNTIP (procalcitonin:4,lacticidven:4) )No results found for this or any previous  visit (from the past 240 hours).   Radiological Exams on Admission: No results found.  EKG: Independently reviewed.  Normal sinus rhythm.  Assessment/Plan Principal Problem:   Hypotension Active Problems:   Acute urinary retention   Parkinson's disease (HCC)   Depression   GERD (gastroesophageal reflux disease)   Sacral wound    Hypotension concerning for sepsis.  Patient recently admitted in August, two months ago for Proteus mirabilis bacteremia likely source sacral decubitus.  At presentation patient was hypotensive with leukocytosis again concerning for possible sepsis.  In the ER patient was started on meropenem and Zyvox.  Follow cultures.  Continue hydration.  MRI of the pelvis is pending.  Will get wound team consult. History of hypertension takes Nebivolol  and telmisartan  which we will hold for now until blood pressure is stable. Diabetes mellitus type 2 takes metformin  we will keep patient on sliding scale coverage.  Last hemoglobin A1c was 5.6 about 5 months ago. Parkinsonism on carbidopa .  Follows with Dr. Bryn neurologist. History of depression on lithium , Cymbalta , Wellbutrin  Remeron .  Takes Klonopin  for anxiety.  Check lithium  levels. BPH on dutasteride  and tamsulosin . Chronic indwelling Foley catheter with abnormal UA.  Follow cultures.  Foley catheter is getting replaced. Anemia appears to be chronic follow CBC. GERD on PPI.  Since patient has concerning features for possible developing sepsis with recent bacteremia will need close monitoring further workup and more than 2 midnight stay.   DVT prophylaxis: Heparin . Code Status: DNR. Family Communication: Discussed with patient. Disposition Plan: Progressive care. Consults called: Wound team. Admission status: Inpatient.

## 2024-07-24 NOTE — ED Notes (Signed)
 Pt back from MRI and hooked back up to the monitor.

## 2024-07-25 DIAGNOSIS — R339 Retention of urine, unspecified: Secondary | ICD-10-CM

## 2024-07-25 DIAGNOSIS — E861 Hypovolemia: Secondary | ICD-10-CM

## 2024-07-25 DIAGNOSIS — K219 Gastro-esophageal reflux disease without esophagitis: Secondary | ICD-10-CM

## 2024-07-25 DIAGNOSIS — N401 Enlarged prostate with lower urinary tract symptoms: Secondary | ICD-10-CM

## 2024-07-25 DIAGNOSIS — D649 Anemia, unspecified: Secondary | ICD-10-CM

## 2024-07-25 DIAGNOSIS — L89159 Pressure ulcer of sacral region, unspecified stage: Secondary | ICD-10-CM

## 2024-07-25 DIAGNOSIS — I10 Essential (primary) hypertension: Secondary | ICD-10-CM

## 2024-07-25 DIAGNOSIS — G20C Parkinsonism, unspecified: Secondary | ICD-10-CM

## 2024-07-25 DIAGNOSIS — F32A Depression, unspecified: Secondary | ICD-10-CM

## 2024-07-25 DIAGNOSIS — E119 Type 2 diabetes mellitus without complications: Secondary | ICD-10-CM

## 2024-07-25 LAB — CBC WITH DIFFERENTIAL/PLATELET
Abs Immature Granulocytes: 0.04 K/uL (ref 0.00–0.07)
Basophils Absolute: 0.1 K/uL (ref 0.0–0.1)
Basophils Relative: 1 %
Eosinophils Absolute: 1 K/uL — ABNORMAL HIGH (ref 0.0–0.5)
Eosinophils Relative: 9 %
HCT: 32.7 % — ABNORMAL LOW (ref 39.0–52.0)
Hemoglobin: 10.2 g/dL — ABNORMAL LOW (ref 13.0–17.0)
Immature Granulocytes: 0 %
Lymphocytes Relative: 20 %
Lymphs Abs: 2.2 K/uL (ref 0.7–4.0)
MCH: 26.8 pg (ref 26.0–34.0)
MCHC: 31.2 g/dL (ref 30.0–36.0)
MCV: 85.8 fL (ref 80.0–100.0)
Monocytes Absolute: 0.9 K/uL (ref 0.1–1.0)
Monocytes Relative: 8 %
Neutro Abs: 6.8 K/uL (ref 1.7–7.7)
Neutrophils Relative %: 62 %
Platelets: 407 K/uL — ABNORMAL HIGH (ref 150–400)
RBC: 3.81 MIL/uL — ABNORMAL LOW (ref 4.22–5.81)
RDW: 15.5 % (ref 11.5–15.5)
WBC: 11.1 K/uL — ABNORMAL HIGH (ref 4.0–10.5)
nRBC: 0 % (ref 0.0–0.2)

## 2024-07-25 LAB — COMPREHENSIVE METABOLIC PANEL WITH GFR
ALT: 8 U/L (ref 0–44)
AST: 11 U/L — ABNORMAL LOW (ref 15–41)
Albumin: 2.2 g/dL — ABNORMAL LOW (ref 3.5–5.0)
Alkaline Phosphatase: 67 U/L (ref 38–126)
Anion gap: 9 (ref 5–15)
BUN: 9 mg/dL (ref 8–23)
CO2: 29 mmol/L (ref 22–32)
Calcium: 8.2 mg/dL — ABNORMAL LOW (ref 8.9–10.3)
Chloride: 102 mmol/L (ref 98–111)
Creatinine, Ser: 0.76 mg/dL (ref 0.61–1.24)
GFR, Estimated: >60 mL/min (ref >60)
Glucose, Bld: 89 mg/dL (ref 70–99)
Potassium: 4 mmol/L (ref 3.5–5.1)
Sodium: 140 mmol/L (ref 135–145)
Total Bilirubin: 0.5 mg/dL (ref 0.0–1.2)
Total Protein: 5.5 g/dL — ABNORMAL LOW (ref 6.5–8.1)

## 2024-07-25 LAB — GLUCOSE, CAPILLARY
Glucose-Capillary: 83 mg/dL (ref 70–99)
Glucose-Capillary: 105 mg/dL — ABNORMAL HIGH (ref 70–99)
Glucose-Capillary: 93 mg/dL (ref 70–99)
Glucose-Capillary: 108 mg/dL — ABNORMAL HIGH (ref 70–99)

## 2024-07-25 LAB — IRON AND TIBC
Iron: 14 ug/dL — ABNORMAL LOW (ref 45–182)
Saturation Ratios: 6 % — ABNORMAL LOW (ref 17.9–39.5)
TIBC: 230 ug/dL — ABNORMAL LOW (ref 250–450)
UIBC: 216 ug/dL

## 2024-07-25 LAB — SEDIMENTATION RATE: Sed Rate: 25 mm/h — ABNORMAL HIGH (ref 0–16)

## 2024-07-25 LAB — C-REACTIVE PROTEIN: CRP: 1.5 mg/dL — ABNORMAL HIGH (ref <1.0)

## 2024-07-25 LAB — PHOSPHORUS: Phosphorus: 3.1 mg/dL (ref 2.5–4.6)

## 2024-07-25 LAB — URINE CULTURE: Culture: 20000 — AB

## 2024-07-25 LAB — FOLATE: Folate: 11 ng/mL (ref >5.9)

## 2024-07-25 LAB — VITAMIN D 25 HYDROXY (VIT D DEFICIENCY, FRACTURES): Vit D, 25-Hydroxy: 23.11 ng/mL — ABNORMAL LOW (ref 30–100)

## 2024-07-25 LAB — VITAMIN B12: Vitamin B-12: 217 pg/mL (ref 180–914)

## 2024-07-25 LAB — PROCALCITONIN: Procalcitonin: <0.1 ng/mL

## 2024-07-25 LAB — MAGNESIUM: Magnesium: 1.7 mg/dL (ref 1.7–2.4)

## 2024-07-25 MED ORDER — VITAMIN C 500 MG PO TABS
500.0000 mg | ORAL_TABLET | Freq: Every day | ORAL | Status: DC
  Administered 2024-07-25 – 2024-07-29 (×5): 500 mg via ORAL
  Filled 2024-07-25 (×5): qty 1

## 2024-07-25 MED ORDER — POLYSACCHARIDE IRON COMPLEX 150 MG PO CAPS
150.0000 mg | ORAL_CAPSULE | Freq: Every day | ORAL | Status: DC
  Administered 2024-07-25 – 2024-07-29 (×5): 150 mg via ORAL
  Filled 2024-07-25 (×5): qty 1

## 2024-07-25 MED ORDER — DIPHENHYDRAMINE HCL 25 MG PO CAPS
25.0000 mg | ORAL_CAPSULE | Freq: Four times a day (QID) | ORAL | Status: DC | PRN
Start: 2024-07-25 — End: 2024-07-29
  Administered 2024-07-25 – 2024-07-29 (×11): 25 mg via ORAL
  Filled 2024-07-25 (×11): qty 1

## 2024-07-25 MED ORDER — GLUCERNA SHAKE PO LIQD
237.0000 mL | Freq: Three times a day (TID) | ORAL | Status: DC
  Administered 2024-07-25 – 2024-07-29 (×13): 237 mL via ORAL

## 2024-07-25 MED ORDER — CYANOCOBALAMIN 1000 MCG/ML IJ SOLN
1000.0000 ug | Freq: Every day | INTRAMUSCULAR | Status: DC
  Administered 2024-07-25 – 2024-07-28 (×4): 1000 ug via INTRAMUSCULAR
  Filled 2024-07-25 (×5): qty 1

## 2024-07-25 MED ORDER — CLOTRIMAZOLE 1 % EX CREA
TOPICAL_CREAM | Freq: Two times a day (BID) | CUTANEOUS | Status: DC
  Filled 2024-07-25 (×4): qty 15

## 2024-07-25 MED ORDER — NYSTATIN 100000 UNIT/GM EX POWD
Freq: Two times a day (BID) | CUTANEOUS | Status: DC
  Filled 2024-07-25 (×3): qty 15

## 2024-07-25 MED ORDER — VITAMIN D (ERGOCALCIFEROL) 1.25 MG (50000 UNIT) PO CAPS
50000.0000 [IU] | ORAL_CAPSULE | ORAL | Status: DC
  Administered 2024-07-26: 50000 [IU] via ORAL
  Filled 2024-07-25: qty 1

## 2024-07-25 MED ORDER — VITAMIN B-12 1000 MCG PO TABS: 1000.0000 ug | ORAL_TABLET | Freq: Every day | ORAL | Status: DC

## 2024-07-25 MED ORDER — ENOXAPARIN SODIUM 40 MG/0.4ML IJ SOSY
40.0000 mg | PREFILLED_SYRINGE | Freq: Every evening | INTRAMUSCULAR | Status: DC
  Administered 2024-07-25 – 2024-07-28 (×4): 40 mg via SUBCUTANEOUS
  Filled 2024-07-25 (×4): qty 0.4

## 2024-07-25 NOTE — Progress Notes (Signed)
 [facility] Progress Note  Patient: Douglas Price    FMW:[id number]  DOA: 07/23/2024     Date of Service: the patient was seen and examined on 07/25/2024  Chief Complaint  Patient presents with   Hypotension   Brief hospital course: LACHLAN MCKIM is a 73 y.o. male with history of Parkinson's disease, diabetes mellitus type 2, hypertension, depression, chronic indwelling Foley catheter was referred to ER from infectious disease office after patient was found to be hypotensive and diaphoretic.  Patient was recently admitted to the hospital in August 2025 for infected decubitus ulcer with Proteus mirabilis bacteremia was on oral antibiotics.  During the stay at skilled nursing facility patient was placed on meropenem and Zyvox and was following up with infectious disease when patient became hypotensive at office.  Was referred to the ER.  Denies any chest pain shortness of breath nausea vomiting or diarrhea.   ED Course: In the ER patient was initially hypotensive with systolic in the 80s improved with fluids.  Temperature was 99 F.  Labs showed WBC of 18.6 hemoglobin 10.2 platelets 440 lactic acid 2.3 creatinine 1.  Patient was started on empiric antibiotics after cultures obtained.  UA is concerning for UTI.  Patient's Foley catheter also is being replaced.  MRI of the pelvis has been ordered which is pending.  Patient admitted for infected decubitus ulcer with concern for sepsis.   Assessment and Plan:  Hypotension concerning for sepsis.  Patient recently admitted in August, two months ago for Proteus mirabilis bacteremia likely source sacral decubitus.  At presentation patient was hypotensive with leukocytosis again concerning for possible sepsis.  In the ER patient was started on meropenem and Zyvox.  Follow cultures.  Continue hydration.  MRI of the pelvis is pending.  Will get wound team consult.  History of hypertension takes Nebivolol  and telmisartan  which we will hold for now  until blood pressure is stable.  Diabetes mellitus type 2 takes metformin  we will keep patient on sliding scale coverage.  Last hemoglobin A1c was 5.6 about 5 months ago.  Parkinsonism on carbidopa .  Follows with Dr. Bryn neurologist.  History of depression on lithium , Cymbalta , Wellbutrin  Remeron .  Takes Klonopin  for anxiety.  Check lithium  levels.  BPH on dutasteride  and tamsulosin .  Chronic indwelling Foley catheter with abnormal UA.  Follow cultures.  Foley catheter is getting replaced.  Anemia appears to be chronic follow CBC.  GERD on PPI.  Vitamin D deficiency: started vitamin D 50,000 units p.o. weekly, follow with PCP to repeat vitamin D level after 3 to 6 months.  Vitamin B12 level 217, goal >400: Started vitamin B12 1000 mcg IM injection daily during hospital stay, followed by oral supplement.  Follow-up PCP to repeat vitamin B12 level after 3 to 6 months.  Iron deficiency, TSAT 6%, started oral iron supplement with vitamin C. Follow-up with PCP to repeat iron profile after 3 to 6 months.  Right axillary fungal infection, started clotrimazole and nystatin powder   Body mass index is 24.99 kg/m.  Interventions:  Wound 07/24/24 1135 Pressure Injury Coccyx Bilateral Stage 4 - Full thickness tissue loss with exposed bone, tendon or muscle. (Active)     Wound 07/24/24 1157 Pressure Injury Buttocks Right Stage 3 -  Full thickness tissue loss. Subcutaneous fat may be visible but bone, tendon or muscle are NOT exposed. (Active)     Diet: Heart healthy carb modified diet DVT Prophylaxis: Subcutaneous Lovenox    Advance goals of care discussion: DNR limited  Family Communication: family was not present at bedside, at the time of interview.  The pt provided permission to discuss medical plan with the family. Opportunity was given to ask question and all questions were answered satisfactorily.   Disposition:  Pt is from SNF, admitted with low BP, possible sepsis, decub  ulcer, still on IV Abx, which precludes a safe discharge. Discharge to SNF, when stable and cleared by ID, may need few days to improve.  Subjective: No significant overnight events. Patient was complaining of itching in the right axillary area, denied any other complaints.  Physical Exam: General: NAD, lying comfortably Appear in no distress, affect appropriate Eyes: PERRLA ENT: Oral Mucosa Clear, moist  Neck: no JVD,  Cardiovascular: S1 and S2 Present, no Murmur,  Respiratory: good respiratory effort, Bilateral Air entry equal and Decreased, no Crackles, no wheezes Abdomen: Bowel Sound present, Soft and no tenderness,  Skin: Decub ulcer, with osteomyelitis Extremities: 1-2+ pedal edema, no calf tenderness Neurologic: Ambulatory dysfunction due to Parkinson disease Gait not checked due to patient safety concerns  Vitals:   07/25/24 0400 07/25/24 0500 07/25/24 0750 07/25/24 1341  BP: 135/82   114/87  Pulse: 76   86  Resp: 20  18 18   Temp: (!) 97.4 F (36.3 C) 97.8 F (36.6 C) 98.3 F (36.8 C) 97.9 F (36.6 C)  TempSrc: Oral Oral Oral Oral  SpO2: 93%   95%  Weight:      Height:        Intake/Output Summary (Last 24 hours) at 07/25/2024 1349 Last data filed at 07/25/2024 0800 Gross per 24 hour  Intake 4507.96 ml  Output 6400 ml  Net -1892.04 ml   Filed Weights   07/24/24 1135  Weight: 90.7 kg    Data Reviewed: I have personally reviewed and interpreted daily labs, tele strips, imagings as discussed above. I reviewed all nursing notes, pharmacy notes, vitals, pertinent old records I have discussed plan of care as described above with RN and patient/family.  CBC: Recent Labs  Lab 07/23/24 1623 07/23/24 2250 07/24/24 0702 07/25/24 0229  WBC 16.9* 18.6* 15.7* 11.1*  NEUTROABS 12,996* 14.8* 11.6* 6.8  HGB 11.2* 10.2* 9.7* 10.2*  HCT 35.5* 33.0* 31.8* 32.7*  MCV 84.9 88.2 87.6 85.8  PLT 486* 440* 437* 407*   Basic Metabolic Panel: Recent Labs  Lab  07/23/24 1623 07/23/24 2250 07/24/24 0702 07/25/24 0229 07/25/24 0900  NA 139 139 138 140  --   K 4.3 4.1 3.8 4.0  --   CL 100 101 100 102  --   CO2 28 27 26 29   --   GLUCOSE 82 108* 103* 89  --   BUN 15 16 15 9   --   CREATININE 0.65* 1.09 0.86 0.76  --   CALCIUM 8.6 7.9* 8.0* 8.2*  --   MG  --   --   --   --  1.7  PHOS  --   --   --   --  3.1    Studies: No results found.  Scheduled Meds:  ascorbic acid  500 mg Oral Daily   buPROPion   300 mg Oral Daily   carbidopa -levodopa   1 tablet Oral TID WC   Chlorhexidine  Gluconate Cloth  6 each Topical Daily   clotrimazole   Topical BID   [START ON 07/27/2024] collagenase    Topical Daily   DULoxetine   120 mg Oral QHS   dutasteride   0.5 mg Oral Daily   feeding supplement (GLUCERNA SHAKE)  237  mL Oral TID BM   heparin   5,000 Units Subcutaneous Q8H   insulin  aspart  0-9 Units Subcutaneous TID WC   lithium  carbonate  150 mg Oral Daily   liver oil-zinc  oxide   Topical BID   mirtazapine   15 mg Oral QHS   nystatin   Topical BID   pantoprazole   40 mg Oral Daily   pregabalin   75 mg Oral BID   sodium chloride  flush  10-40 mL Intracatheter Q12H   sodium hypochlorite   Irrigation Daily   tamsulosin   0.4 mg Oral QHS   Continuous Infusions:  linezolid (ZYVOX) IV 600 mg (07/25/24 0930)   meropenem (MERREM) IV 1 g (07/25/24 0541)   PRN Meds: acetaminophen  **OR** acetaminophen , clonazePAM , sodium chloride  flush  Time spent: 55 minutes  Author: ELVAN SOR. MD [facility] 07/25/2024 1:49 PM  To reach On-call, see care teams to locate the attending and reach out to them via [email or web address]. If 7PM-7AM, please contact night-coverage If you still have difficulty reaching the attending provider, please page the [facility] (Director on Call) for [facility] on amion for assistance.

## 2024-07-25 NOTE — Evaluation (Signed)
 Physical Therapy Evaluation Patient Details Name: Douglas Price MRN: [id number] DOB: [birth year] Today's Date: 07/25/2024  History of Present Illness  Pt is a 73 y/o male admitted 10/30 from infection ds office after being found hypotensive and diaphoretic.  In the [facility] ED pt admitted to floor with concern for sepsis of a decubitus ulcer undergoing treatment.  PMHx:  DDD, DM, peripheral neuropathy, RLS, spinal stenosis, s/p lumbar lami/microdiscectomy.  Clinical Impression  Pt admitted with/for hypotension and diaphoresis.  He is self limiting, wanting to do as little as he can.   Pt agreed to mobility then balked when asked to move.  He was limited to rolling, which pt needed for repositioning to the L.   Pt currently limited functionally due to the problems listed. ( See problems list.)   Pt will benefit from PT to maximize function and safety in order to get ready for next venue listed below.         If plan is discharge home, recommend the following: Two people to help with walking and/or transfers;Assistance with cooking/housework;Two people to help with bathing/dressing/bathroom;Assist for transportation;Other (comment) (skilled therapy trial for participation and eventually LT SNF)   Can travel by private vehicle        Equipment Recommendations None recommended by PT  Recommendations for Other Services       Functional Status Assessment Patient has had a recent decline in their functional status and demonstrates the ability to make significant improvements in function in a reasonable and predictable amount of time.     Precautions / Restrictions Precautions Precautions: Other (comment) (skin breakdown) Recall of Precautions/Restrictions: Impaired      Mobility  Bed Mobility Overal bed mobility: Needs Assistance Bed Mobility: Rolling Rolling: Mod assist (right or left)         General bed mobility comments: Pt would not attempt to move toward the EOB to attempt  sitting, due to pain at/around sacrum.  But agreed with max encouragement to let me assist rolling to reposition, as he had been laying on the R side for more than 3 hours.    Transfers                   General transfer comment: NT    Ambulation/Gait               General Gait Details: NT  Stairs            Wheelchair Mobility     Tilt Bed    Modified Rankin (Stroke Patients Only)       Balance                                             Pertinent Vitals/Pain Pain Assessment Pain Assessment: Faces Faces Pain Scale: Hurts even more Pain Location: decubitus Pain Descriptors / Indicators: Burning, Sore Pain Intervention(s): Monitored during session, Heat applied, Repositioned    Home Living Family/patient expects to be discharged to:: Skilled nursing facility                   Additional Comments: pt has been receiving PT/OT services    Prior Function Prior Level of Function : Needs assist             Mobility Comments: Pt reports he requires +2 assist from facility staff with mobility. He is usually hoyer lifted  to manual w/c. Pt states he can propel himself using B feet and LUE. His R hand is unable to grasp the rim to assist in pushing. Pt reports he has been working on transfers in therapy. ADLs Comments: Pt reports he requires +2 assist from facility staff with ADLs and relies on staff for all IADLs. He is able to feed himself.  Mostly found in past notes     Extremity/Trunk Assessment   Upper Extremity Assessment Upper Extremity Assessment: Defer to OT evaluation    Lower Extremity Assessment Lower Extremity Assessment: Generalized weakness;Overall [facility] for tasks assessed    Cervical / Trunk Assessment Cervical / Trunk Assessment: Kyphotic  Communication   Communication Communication: No apparent difficulties    Cognition Arousal: Alert Behavior During Therapy: Flat affect, WFL for tasks  assessed/performed   PT - Cognitive impairments: Difficult to assess Difficult to assess due to:  (poor historian and not wanting to participate)                       Following commands: Impaired Following commands impaired: Only follows one step commands consistently, Follows one step commands with increased time     Cueing Cueing Techniques: Verbal cues     General Comments General comments (skin integrity, edema, etc.): VSS    Exercises     Assessment/Plan    PT Assessment Patient needs continued PT services  PT Problem List Decreased activity tolerance;Decreased mobility;Pain;Decreased strength;Decreased range of motion;Decreased balance       PT Treatment Interventions Functional mobility training;Therapeutic activities;Neuromuscular re-education;Patient/family education;Balance training;Therapeutic exercise    PT Goals (Current goals can be found in the Care Plan section)  Acute Rehab PT Goals Patient Stated Goal: Did not participate Time For Goal Achievement: 08/01/24 Potential to Achieve Goals: Fair    Frequency Min 2X/week     Co-evaluation               AM-PAC PT 6 Clicks Mobility  Outcome Measure Help needed turning from your back to your side while in a flat bed without using bedrails?: A Lot Help needed moving from lying on your back to sitting on the side of a flat bed without using bedrails?: Total Help needed moving to and from a bed to a chair (including a wheelchair)?: Total Help needed standing up from a chair using your arms (e.g., wheelchair or bedside chair)?: Total Help needed to walk in hospital room?: Total Help needed climbing 3-5 steps with a railing? : Total 6 Click Score: 7    End of Session   Activity Tolerance: Patient tolerated treatment well;Other (comment) (limited by pt due to pain and suspect due to anxiety about getting to EOB) Patient left: in bed;with call bell/phone within reach;with family/visitor  present;with nursing/sitter in room Nurse Communication: Mobility status PT Visit Diagnosis: Pain;Other abnormalities of gait and mobility (R26.89) Pain - part of body:  (sacral)    Time: 8674-8647 PT Time Calculation (min) (ACUTE ONLY): 27 min   Charges:   PT Evaluation $PT Eval Moderate Complexity: 1 Mod PT Treatments $Therapeutic Activity: 8-22 mins PT General Charges $$ ACUTE PT VISIT: 1 Visit         07/25/2024  India HERO., PT Acute Rehabilitation Services [phone]  (office)07/25/2024  India HERO., PT Acute Rehabilitation Services [phone]  (office)  Douglas Price 07/25/2024, 3:26 PM

## 2024-07-25 NOTE — Progress Notes (Signed)
 Physical Therapy Evaluation Patient Details Name: EIN RIJO MRN: [id number] DOB: September 04, 1951 Today's Date: 07/25/2024  History of Present Illness  Pt is a 73 y/o male admitted 10/30 from infection ds office after being found hypotensive and diaphoretic.  In the [facility] ED pt admitted to floor with concern for sepsis of a decubitus ulcer undergoing treatment.  PMHx:  DDD, DM, peripheral neuropathy, RLS, spinal stenosis, s/p lumbar lami/microdiscectomy.  Clinical Impression  Pt admitted with/for hypotension and diaphoresis, likely due to sepsis as well as parkinsisons.  Pt currently limited functionally due to the problems listed. ( See problems list.)   Pt will benefit from PT to maximize function and safety in order to get ready for next venue listed below.         If plan is discharge home, recommend the following: Two people to help with walking and/or transfers;Assistance with cooking/housework;Two people to help with bathing/dressing/bathroom;Assist for transportation;Other (comment) (skilled therapy trial for participation and eventually LT SNF)   Can travel by private vehicle        Equipment Recommendations None recommended by PT  Recommendations for Other Services       Functional Status Assessment Patient has had a recent decline in their functional status and demonstrates the ability to make significant improvements in function in a reasonable and predictable amount of time.     Precautions / Restrictions Precautions Precautions: Other (comment) (skin breakdown) Recall of Precautions/Restrictions: Impaired      Mobility  Bed Mobility Overal bed mobility: Needs Assistance Bed Mobility: Rolling Rolling: Mod assist (right or left)         General bed mobility comments: Pt would not attempt to move toward the EOB to attempt sitting, due to pain at/around sacrum.  But agreed with max encouragement to let me assist rolling to reposition, as he had been laying on the R  side for more than 3 hours.    Transfers                   General transfer comment: NT    Ambulation/Gait               General Gait Details: NT  Stairs            Wheelchair Mobility     Tilt Bed    Modified Rankin (Stroke Patients Only)       Balance                                             Pertinent Vitals/Pain Pain Assessment Pain Assessment: Faces Faces Pain Scale: Hurts even more Pain Location: decubitus Pain Descriptors / Indicators: Burning, Sore Pain Intervention(s): Monitored during session, Heat applied, Repositioned    Home Living Family/patient expects to be discharged to:: Skilled nursing facility                   Additional Comments: pt has been receiving PT/OT services    Prior Function Prior Level of Function : Needs assist             Mobility Comments: Pt reports he requires +2 assist from facility staff with mobility. He is usually hoyer lifted to manual w/c. Pt states he can propel himself using B feet and LUE. His R hand is unable to grasp the rim to assist in pushing. Pt reports he has been  working on transfers in therapy. ADLs Comments: Pt reports he requires +2 assist from facility staff with ADLs and relies on staff for all IADLs. He is able to feed himself.  Mostly found in past notes     Extremity/Trunk Assessment   Upper Extremity Assessment Upper Extremity Assessment: Defer to OT evaluation    Lower Extremity Assessment Lower Extremity Assessment: Generalized weakness;Overall [facility] for tasks assessed    Cervical / Trunk Assessment Cervical / Trunk Assessment: Kyphotic  Communication   Communication Communication: No apparent difficulties    Cognition Arousal: Alert Behavior During Therapy: Flat affect, WFL for tasks assessed/performed   PT - Cognitive impairments: Difficult to assess Difficult to assess due to:  (poor historian and not wanting to participate)                        Following commands: Impaired Following commands impaired: Only follows one step commands consistently, Follows one step commands with increased time     Cueing Cueing Techniques: Verbal cues     General Comments General comments (skin integrity, edema, etc.): VSS    Exercises     Assessment/Plan    PT Assessment Patient needs continued PT services  PT Problem List Decreased activity tolerance;Decreased mobility;Pain;Decreased strength;Decreased range of motion;Decreased balance       PT Treatment Interventions Functional mobility training;Therapeutic activities;Neuromuscular re-education;Patient/family education;Balance training;Therapeutic exercise    PT Goals (Current goals can be found in the Care Plan section)  Acute Rehab PT Goals Patient Stated Goal: Did not participate Time For Goal Achievement: 08/01/24 Potential to Achieve Goals: Fair    Frequency Min 2X/week     Co-evaluation               AM-PAC PT 6 Clicks Mobility  Outcome Measure Help needed turning from your back to your side while in a flat bed without using bedrails?: A Lot Help needed moving from lying on your back to sitting on the side of a flat bed without using bedrails?: Total Help needed moving to and from a bed to a chair (including a wheelchair)?: Total Help needed standing up from a chair using your arms (e.g., wheelchair or bedside chair)?: Total Help needed to walk in hospital room?: Total Help needed climbing 3-5 steps with a railing? : Total 6 Click Score: 7    End of Session   Activity Tolerance: Patient tolerated treatment well;Other (comment) (limited by pt due to pain and suspect due to anxiety about getting to EOB) Patient left: in bed;with call bell/phone within reach;with family/visitor present;with nursing/sitter in room Nurse Communication: Mobility status PT Visit Diagnosis: Pain;Other abnormalities of gait and mobility (R26.89) Pain - part of  body:  (sacral)    Time: 8674-8647 PT Time Calculation (min) (ACUTE ONLY): 27 min   Charges:   PT Evaluation $PT Eval Moderate Complexity: 1 Mod PT Treatments $Therapeutic Activity: 8-22 mins PT General Charges $$ ACUTE PT VISIT: 1 Visit         07/25/2024  India HERO., PT Acute Rehabilitation Services [phone]  (office)  Vinie GAILS Nylen Creque 07/25/2024, 3:38 PM

## 2024-07-25 NOTE — Progress Notes (Signed)
 [facility] night cross cover note:   Per pt's request, I have added prn benadryl  for itching related to his existing right axillary rash.     Eva Pore, DO Hospitalist

## 2024-07-26 DIAGNOSIS — E861 Hypovolemia: Secondary | ICD-10-CM | POA: Diagnosis not present

## 2024-07-26 LAB — MAGNESIUM: Magnesium: 1.7 mg/dL (ref 1.7–2.4)

## 2024-07-26 LAB — BASIC METABOLIC PANEL WITH GFR
Anion gap: 11 (ref 5–15)
BUN: 9 mg/dL (ref 8–23)
CO2: 26 mmol/L (ref 22–32)
Calcium: 8.2 mg/dL — ABNORMAL LOW (ref 8.9–10.3)
Chloride: 102 mmol/L (ref 98–111)
Creatinine, Ser: 0.69 mg/dL (ref 0.61–1.24)
GFR, Estimated: >60 mL/min (ref >60)
Glucose, Bld: 92 mg/dL (ref 70–99)
Potassium: 4 mmol/L (ref 3.5–5.1)
Sodium: 139 mmol/L (ref 135–145)

## 2024-07-26 LAB — CBC
HCT: 35.4 % — ABNORMAL LOW (ref 39.0–52.0)
Hemoglobin: 11 g/dL — ABNORMAL LOW (ref 13.0–17.0)
MCH: 26.8 pg (ref 26.0–34.0)
MCHC: 31.1 g/dL (ref 30.0–36.0)
MCV: 86.3 fL (ref 80.0–100.0)
Platelets: 404 K/uL — ABNORMAL HIGH (ref 150–400)
RBC: 4.1 MIL/uL — ABNORMAL LOW (ref 4.22–5.81)
RDW: 15 % (ref 11.5–15.5)
WBC: 13.9 K/uL — ABNORMAL HIGH (ref 4.0–10.5)
nRBC: 0 % (ref 0.0–0.2)

## 2024-07-26 LAB — GLUCOSE, CAPILLARY
Glucose-Capillary: 99 mg/dL (ref 70–99)
Glucose-Capillary: 121 mg/dL — ABNORMAL HIGH (ref 70–99)
Glucose-Capillary: 109 mg/dL — ABNORMAL HIGH (ref 70–99)
Glucose-Capillary: 159 mg/dL — ABNORMAL HIGH (ref 70–99)

## 2024-07-26 LAB — PHOSPHORUS: Phosphorus: 3.2 mg/dL (ref 2.5–4.6)

## 2024-07-26 MED ORDER — BISACODYL 5 MG PO TBEC: 10.0000 mg | DELAYED_RELEASE_TABLET | Freq: Once | ORAL | Status: DC

## 2024-07-26 MED ORDER — MAGNESIUM OXIDE -MG SUPPLEMENT 400 (240 MG) MG PO TABS
400.0000 mg | ORAL_TABLET | Freq: Two times a day (BID) | ORAL | Status: AC
  Administered 2024-07-26 – 2024-07-28 (×6): 400 mg via ORAL
  Filled 2024-07-26 (×6): qty 1

## 2024-07-26 MED ORDER — BISACODYL 10 MG RE SUPP: 10.0000 mg | Freq: Every day | RECTAL | Status: DC | PRN

## 2024-07-26 MED ORDER — POLYETHYLENE GLYCOL 3350 17 G PO PACK
17.0000 g | PACK | Freq: Two times a day (BID) | ORAL | Status: DC
  Administered 2024-07-27 – 2024-07-28 (×4): 17 g via ORAL
  Filled 2024-07-26 (×6): qty 1

## 2024-07-26 MED ORDER — BISACODYL 5 MG PO TBEC
10.0000 mg | DELAYED_RELEASE_TABLET | Freq: Every day | ORAL | Status: DC
  Administered 2024-07-27 – 2024-07-28 (×2): 10 mg via ORAL
  Filled 2024-07-26 (×3): qty 2

## 2024-07-26 MED ORDER — DIPHENHYDRAMINE-ZINC ACETATE 2-0.1 % EX CREA
TOPICAL_CREAM | Freq: Three times a day (TID) | CUTANEOUS | Status: DC | PRN
  Administered 2024-07-27: 1 via TOPICAL
  Filled 2024-07-26 (×2): qty 28

## 2024-07-26 MED ORDER — SODIUM CHLORIDE 0.9 % IV SOLN
2.0000 g | INTRAVENOUS | Status: DC
  Administered 2024-07-26: 2 g via INTRAVENOUS
  Filled 2024-07-26: qty 20

## 2024-07-26 MED ORDER — OXYCODONE HCL 5 MG PO TABS
5.0000 mg | ORAL_TABLET | Freq: Four times a day (QID) | ORAL | Status: DC | PRN
  Administered 2024-07-26 – 2024-07-28 (×4): 5 mg via ORAL
  Filled 2024-07-26 (×5): qty 1

## 2024-07-26 NOTE — Progress Notes (Signed)
 [facility] Progress Note  Patient: DOVER HEAD    FMW:[id number]  DOA: 07/23/2024     Date of Service: the patient was seen and examined on 07/26/2024  Chief Complaint  Patient presents with   Hypotension   Brief hospital course: ANTHEM FRAZER is a 73 y.o. male with history of Parkinson's disease, diabetes mellitus type 2, hypertension, depression, chronic indwelling Foley catheter was referred to ER from infectious disease office after patient was found to be hypotensive and diaphoretic.  Patient was recently admitted to the hospital in August 2025 for infected decubitus ulcer with Proteus mirabilis bacteremia was on oral antibiotics.  During the stay at skilled nursing facility patient was placed on meropenem and Zyvox and was following up with infectious disease when patient became hypotensive at office.  Was referred to the ER.  Denies any chest pain shortness of breath nausea vomiting or diarrhea.   ED Course: In the ER patient was initially hypotensive with systolic in the 80s improved with fluids.  Temperature was 99 F.  Labs showed WBC of 18.6 hemoglobin 10.2 platelets 440 lactic acid 2.3 creatinine 1.  Patient was started on empiric antibiotics after cultures obtained.  UA is concerning for UTI.  Patient's Foley catheter also is being replaced.  MRI of the pelvis has been ordered which is pending.  Patient admitted for infected decubitus ulcer with concern for sepsis.   Assessment and Plan:  Hypotension concerning for sepsis.  Patient recently admitted in August, two months ago for Proteus mirabilis bacteremia likely source sacral decubitus.  At presentation patient was hypotensive with leukocytosis again concerning for possible sepsis.  In the ER patient was started on meropenem and Zyvox.  S/p IVF Follow cultures.  MRI PELVIS: Large decubitus ulcer along the right posterior sacrococcygeal margin with low-level marrow edema in S4S5 and coccyx compatible with early  osteomyelitis; no overt bony destruction. 2. Low-level edema in the lower posterior paraspinal musculature without drainable abscess. 3. Mild presacral edema. Consulted wound care PICC line insertion site looks infected, PICC line was removed on 11/1, as per ID needs line holiday 72 hours.  Follow repeat blood cultures ID following, recommended ceftriaxone  for 2 weeks and Zyvox for 10 days.  History of hypertension takes Nebivolol  and telmisartan  which we will hold for now until blood pressure is stable.  Diabetes mellitus type 2 takes metformin  we will keep patient on sliding scale coverage.  Last hemoglobin A1c was 5.6 about 5 months ago.  Parkinsonism on carbidopa .  Follows with Dr. Bryn neurologist.  History of depression on lithium , Cymbalta , Wellbutrin  Remeron .  Takes Klonopin  for anxiety.  Check lithium  levels.  BPH on dutasteride  and tamsulosin .  Chronic indwelling Foley catheter with abnormal UA.  Follow cultures.  Foley catheter is getting replaced.   GERD on PPI.  Vitamin D deficiency: started vitamin D 50,000 units p.o. weekly, follow with PCP to repeat vitamin D level after 3 to 6 months.  Vitamin B12 level 217, goal >400: Started vitamin B12 1000 mcg IM injection daily during hospital stay, followed by oral supplement.  Follow-up PCP to repeat vitamin B12 level after 3 to 6 months.  Anemia due to iron deficiency and low B12 level.  Monitor CBC.   Iron deficiency, TsatT 6%, started oral iron supplement with vit C. Follow-up with PCP to repeat iron profile after 3 to 6 months.  Right axillary fungal infection, started clotrimazole and nystatin powder. Benadryl  as needed for itching   Body mass index is 24.66  kg/m.  Interventions:  Wound 07/24/24 1135 Pressure Injury Coccyx Bilateral Stage 4 - Full thickness tissue loss with exposed bone, tendon or muscle. (Active)     Wound 07/24/24 1157 Pressure Injury Buttocks Right Stage 3 -  Full thickness tissue loss.  Subcutaneous fat may be visible but bone, tendon or muscle are NOT exposed. (Active)     Diet: Heart healthy carb modified diet DVT Prophylaxis: Subcutaneous Lovenox    Advance goals of care discussion: DNR limited  Family Communication: family was not present at bedside, at the time of interview.  The pt provided permission to discuss medical plan with the family. Opportunity was given to ask question and all questions were answered satisfactorily.   Disposition:  Pt is from SNF, admitted with low BP, possible sepsis, decub ulcer, still on IV Abx, which precludes a safe discharge. Discharge to SNF, when stable and cleared by ID, may need few days to improve.  Subjective: No significant overnight events.  As per patient itching in the right axilla is getting better, requested Benadryl  for as needed. No any other complaints.   Physical Exam: General: NAD, lying comfortably Appear in no distress, affect appropriate Eyes: PERRLA ENT: Oral Mucosa Clear, moist  Neck: no JVD,  Cardiovascular: S1 and S2 Present, no Murmur,  Respiratory: good respiratory effort, Bilateral Air entry equal and Decreased, no Crackles, no wheezes Abdomen: Bowel Sound present, Soft and no tenderness,  Skin: Decub ulcer, with osteomyelitis Extremities: 1-2+ pedal edema, no calf tenderness Neurologic: Ambulatory dysfunction due to Parkinson disease Gait not checked due to patient safety concerns  Vitals:   07/25/24 2316 07/26/24 0540 07/26/24 0715 07/26/24 1100  BP: (!) 135/90  (!) 146/79 119/75  Pulse: 91 86 90 91  Resp: 20 20 20 20   Temp: 98.1 F (36.7 C) 98.2 F (36.8 C) 98.2 F (36.8 C) 98.2 F (36.8 C)  TempSrc: Oral Oral Oral Oral  SpO2: 96% 97% 96% 97%  Weight:  89.5 kg    Height:        Intake/Output Summary (Last 24 hours) at 07/26/2024 1534 Last data filed at 07/26/2024 1300 Gross per 24 hour  Intake 600 ml  Output 3350 ml  Net -2750 ml   Filed Weights   07/24/24 1135 07/26/24 0540   Weight: 90.7 kg 89.5 kg    Data Reviewed: I have personally reviewed and interpreted daily labs, tele strips, imagings as discussed above. I reviewed all nursing notes, pharmacy notes, vitals, pertinent old records I have discussed plan of care as described above with RN and patient/family.  CBC: Recent Labs  Lab 07/23/24 1623 07/23/24 2250 07/24/24 0702 07/25/24 0229 07/26/24 0807  WBC 16.9* 18.6* 15.7* 11.1* 13.9*  NEUTROABS 12,996* 14.8* 11.6* 6.8  --   HGB 11.2* 10.2* 9.7* 10.2* 11.0*  HCT 35.5* 33.0* 31.8* 32.7* 35.4*  MCV 84.9 88.2 87.6 85.8 86.3  PLT 486* 440* 437* 407* 404*   Basic Metabolic Panel: Recent Labs  Lab 07/23/24 1623 07/23/24 2250 07/24/24 0702 07/25/24 0229 07/25/24 0900 07/26/24 0807  NA 139 139 138 140  --  139  K 4.3 4.1 3.8 4.0  --  4.0  CL 100 101 100 102  --  102  CO2 28 27 26 29   --  26  GLUCOSE 82 108* 103* 89  --  92  BUN 15 16 15 9   --  9  CREATININE 0.65* 1.09 0.86 0.76  --  0.69  CALCIUM 8.6 7.9* 8.0* 8.2*  --  8.2*  MG  --   --   --   --  1.7 1.7  PHOS  --   --   --   --  3.1 3.2    Studies: No results found.  Scheduled Meds:  ascorbic acid  500 mg Oral Daily   bisacodyl   10 mg Oral QHS   bisacodyl   10 mg Oral Once   buPROPion   300 mg Oral Daily   carbidopa -levodopa   1 tablet Oral TID WC   Chlorhexidine  Gluconate Cloth  6 each Topical Daily   clotrimazole   Topical BID   [START ON 07/27/2024] collagenase    Topical Daily   cyanocobalamin  1,000 mcg Intramuscular Q1200   Followed by   NOREEN ON 08/01/2024] vitamin B-12  1,000 mcg Oral Daily   DULoxetine   120 mg Oral QHS   dutasteride   0.5 mg Oral Daily   enoxaparin  (LOVENOX ) injection  40 mg Subcutaneous QPM   feeding supplement (GLUCERNA SHAKE)  237 mL Oral TID BM   insulin  aspart  0-9 Units Subcutaneous TID WC   iron polysaccharides  150 mg Oral Daily   lithium  carbonate  150 mg Oral Daily   liver oil-zinc  oxide   Topical BID   magnesium  oxide  400 mg Oral BID    mirtazapine   15 mg Oral QHS   nystatin   Topical BID   pantoprazole   40 mg Oral Daily   polyethylene glycol  17 g Oral BID   pregabalin   75 mg Oral BID   sodium chloride  flush  10-40 mL Intracatheter Q12H   sodium hypochlorite   Irrigation Daily   tamsulosin   0.4 mg Oral QHS   Vitamin D (Ergocalciferol)  50,000 Units Oral Q7 days   Continuous Infusions:  cefTRIAXone  (ROCEPHIN )  IV 2 g (07/26/24 1427)   linezolid (ZYVOX) IV 600 mg (07/26/24 0846)   PRN Meds: acetaminophen  **OR** acetaminophen , bisacodyl , clonazePAM , diphenhydrAMINE , diphenhydrAMINE -zinc  acetate, sodium chloride  flush  Time spent: 40 minutes  Author: ELVAN SOR. MD [facility] 07/26/2024 3:34 PM  To reach On-call, see care teams to locate the attending and reach out to them via [email or web address]. If 7PM-7AM, please contact night-coverage If you still have difficulty reaching the attending provider, please page the [facility] (Director on Call) for [facility] on amion for assistance.

## 2024-07-26 NOTE — Progress Notes (Signed)
 OT Screen Note  Patient Details Name: Douglas Price MRN: [id number] DOB: 1951/02/13   Cancelled Treatment:    Reason Eval/Treat Not Completed: OT screened, no needs identified, will sign off Recommendation to RN and MD regarding need for pressure relief of air mattress for sacral wound.  [name] 07/26/2024, 2:33 PM

## 2024-07-26 NOTE — Progress Notes (Signed)
 [facility]  Date of Admission:  07/23/2024    Principal Problem:   Hypotension Active Problems:   Parkinson's disease (HCC)   Depression   BPH (benign prostatic hyperplasia)   GERD (gastroesophageal reflux disease)   Acute urinary retention   Sacral wound   Diabetes mellitus type 2 in nonobese (HCC)   Anemia   Essential hypertension          Assessment: 73 year old male with past medical history of Parkinson's disease, diabetes mellitus, depression, chronic indwelling Foley history of Proteus mirabilis bacteremia intermediate to imipenem presents after seeing in ID clinic for sacral wound for which she is on meropenem since 10/22 found to be hypotensive. #Sacral wound #Hypotension - MR relatively unchanged from August.  Inspect PICC line.  Given temperature 99.  His white cell count did go down from admission unclear if this is secondary to fluid repletion.  Patient does state that he had been drinking more fluid. Recommendations: -pull picc. During dressing change nursing noted redness at insertion site and the skin where the old dressing was was red and irritated. As mRI is relatively unchanged form previous MR in August +/- myositis. Will repeat blood cx with line holiday(72hours). I think at this point treat as line infection with ceftriaxone  x 2 weeks.ok to continue linezolid x 10 days for SSTI. -follow blood Cx  Microbiology:   Antibiotics: Linezolid 10/30- merrem Cultures: Blood 10/31 ng Urine 10/31 yeat Other   SUBJECTIVE: Resitng in bed Interval:  afebrile overnight  Review of Systems: ROS   Scheduled Meds:  ascorbic acid  500 mg Oral Daily   buPROPion   300 mg Oral Daily   carbidopa -levodopa   1 tablet Oral TID WC   Chlorhexidine  Gluconate Cloth  6 each Topical Daily   clotrimazole   Topical BID   [START ON 07/27/2024] collagenase    Topical Daily   cyanocobalamin  1,000 mcg Intramuscular Q1200   Followed by    NOREEN ON 08/01/2024] vitamin B-12  1,000 mcg Oral Daily   DULoxetine   120 mg Oral QHS   dutasteride   0.5 mg Oral Daily   enoxaparin  (LOVENOX ) injection  40 mg Subcutaneous QPM   feeding supplement (GLUCERNA SHAKE)  237 mL Oral TID BM   insulin  aspart  0-9 Units Subcutaneous TID WC   iron polysaccharides  150 mg Oral Daily   lithium  carbonate  150 mg Oral Daily   liver oil-zinc  oxide   Topical BID   mirtazapine   15 mg Oral QHS   nystatin   Topical BID   pantoprazole   40 mg Oral Daily   pregabalin   75 mg Oral BID   sodium chloride  flush  10-40 mL Intracatheter Q12H   sodium hypochlorite   Irrigation Daily   tamsulosin   0.4 mg Oral QHS   Vitamin D (Ergocalciferol)  50,000 Units Oral Q7 days   Continuous Infusions:  linezolid (ZYVOX) IV 600 mg (07/25/24 2310)   meropenem (MERREM) IV 1 g (07/26/24 0554)   PRN Meds:.acetaminophen  **OR** acetaminophen , clonazePAM , diphenhydrAMINE , sodium chloride  flush Allergies  Allergen Reactions   Avelox [Moxifloxacin] Other (See Comments)    Angioedema Fatigue   Quinolones Other (See Comments)    Angioedema   Lexapro [Escitalopram] Other (See Comments)    unknown    OBJECTIVE: Vitals:   07/25/24 1601 07/25/24 1943 07/25/24 2316 07/26/24 0540  BP: 137/86 (!) 157/90 (!) 135/90   Pulse: 96 95 91 86  Resp: 18 20 20  20  Temp: 98.2 F (36.8 C) 99 F (37.2 C) 98.1 F (36.7 C) 98.2 F (36.8 C)  TempSrc: Oral Oral Oral Oral  SpO2: 96% 95% 96% 97%  Weight:    89.5 kg  Height:       Body mass index is 24.66 kg/m.  Physical Exam    Lab Results Lab Results  Component Value Date   WBC 11.1 (H) 07/25/2024   HGB 10.2 (L) 07/25/2024   HCT 32.7 (L) 07/25/2024   MCV 85.8 07/25/2024   PLT 407 (H) 07/25/2024    Lab Results  Component Value Date   CREATININE 0.76 07/25/2024   BUN 9 07/25/2024   NA 140 07/25/2024   K 4.0 07/25/2024   CL 102 07/25/2024   CO2 29 07/25/2024    Lab Results  Component Value Date   ALT 8 07/25/2024   AST  11 (L) 07/25/2024   ALKPHOS 67 07/25/2024   BILITOT 0.5 07/25/2024        Loney Stank, MD [facility] 07/26/2024, 6:53 AM   Evaluation of this patient requires complex antimicrobial therapy evaluation and counseling + isolation needs for disease transmission risk assessment and mitigation

## 2024-07-26 NOTE — Plan of Care (Signed)
  Problem: Education: Goal: Ability to describe self-care measures that may prevent or decrease complications (Diabetes Survival Skills Education) will improve Outcome: Progressing   Problem: Coping: Goal: Ability to adjust to condition or change in health will improve Outcome: Progressing

## 2024-07-27 DIAGNOSIS — I9589 Other hypotension: Secondary | ICD-10-CM

## 2024-07-27 LAB — BASIC METABOLIC PANEL WITH GFR
Anion gap: 10 (ref 5–15)
BUN: 14 mg/dL (ref 8–23)
CO2: 27 mmol/L (ref 22–32)
Calcium: 8.2 mg/dL — ABNORMAL LOW (ref 8.9–10.3)
Chloride: 102 mmol/L (ref 98–111)
Creatinine, Ser: 0.79 mg/dL (ref 0.61–1.24)
GFR, Estimated: >60 mL/min (ref >60)
Glucose, Bld: 96 mg/dL (ref 70–99)
Potassium: 4.1 mmol/L (ref 3.5–5.1)
Sodium: 139 mmol/L (ref 135–145)

## 2024-07-27 LAB — CBC
HCT: 34 % — ABNORMAL LOW (ref 39.0–52.0)
Hemoglobin: 10.7 g/dL — ABNORMAL LOW (ref 13.0–17.0)
MCH: 26.9 pg (ref 26.0–34.0)
MCHC: 31.5 g/dL (ref 30.0–36.0)
MCV: 85.4 fL (ref 80.0–100.0)
Platelets: 408 K/uL — ABNORMAL HIGH (ref 150–400)
RBC: 3.98 MIL/uL — ABNORMAL LOW (ref 4.22–5.81)
RDW: 15.2 % (ref 11.5–15.5)
WBC: 13.2 K/uL — ABNORMAL HIGH (ref 4.0–10.5)
nRBC: 0 % (ref 0.0–0.2)

## 2024-07-27 LAB — GLUCOSE, CAPILLARY
Glucose-Capillary: 99 mg/dL (ref 70–99)
Glucose-Capillary: 182 mg/dL — ABNORMAL HIGH (ref 70–99)
Glucose-Capillary: 101 mg/dL — ABNORMAL HIGH (ref 70–99)
Glucose-Capillary: 160 mg/dL — ABNORMAL HIGH (ref 70–99)

## 2024-07-27 LAB — MAGNESIUM: Magnesium: 1.8 mg/dL (ref 1.7–2.4)

## 2024-07-27 LAB — PHOSPHORUS: Phosphorus: 3.3 mg/dL (ref 2.5–4.6)

## 2024-07-27 MED ORDER — SODIUM CHLORIDE 0.9 % IV SOLN: 2.0000 g | INTRAVENOUS | Status: DC

## 2024-07-27 MED ORDER — DAPTOMYCIN-SODIUM CHLORIDE 700-0.9 MG/100ML-% IV SOLN
8.0000 mg/kg | Freq: Every day | INTRAVENOUS | Status: DC
  Administered 2024-07-27 – 2024-07-28 (×2): 700 mg via INTRAVENOUS
  Filled 2024-07-27 (×3): qty 100

## 2024-07-27 MED ORDER — SODIUM CHLORIDE 0.9 % IV SOLN
2.0000 g | INTRAVENOUS | Status: DC
  Administered 2024-07-27 – 2024-07-28 (×2): 2 g via INTRAVENOUS
  Filled 2024-07-27 (×2): qty 20

## 2024-07-27 NOTE — Hospital Course (Addendum)
 Brief Narrative:  73 y.o. male with history of Parkinson's disease, diabetes mellitus type 2, hypertension, depression, chronic indwelling Foley catheter was referred to ER from infectious disease office after patient was found to be hypotensive and diaphoretic.  Patient was recently admitted to the hospital in August 2025 for infected decubitus ulcer with Proteus mirabilis bacteremia was on oral antibiotics.  During the stay at skilled nursing facility patient was placed on meropenem and Zyvox and was following up with infectious disease when patient became hypotensive at office.  Was referred to the ER.  Foley was replaced in the ER.  Repeat MRI largely unchanged, ongoing local wound care.  With recommendations from ID, PICC line removed on 11/2, repeat cultures have been sent.   Assessment & Plan:  Septic shock/hypotension secondary to PICC line infection - Concerns of possible underlying PICC line infection/worsening sacral wound.  MRI pelvis shows large decubitus ulcer with possible early osteomyelitis.  Largely unchanged from August.  Seen by ID, recommending line holiday -Repeat blood cultures 11/2-sent -Plans for 2 weeks of Rocephin  and 10 days of Zyvox.  Will likely need midline    History of hypertension  takes Nebivolol  and telmisartan  which we will hold for now until blood pressure is stable.   Diabetes mellitus type 2  takes metformin  we will keep patient on sliding scale coverage.  Last hemoglobin A1c was 5.6 about 5 months ago.   Parkinsonism  on carbidopa .  Follows with Dr. Bryn neurologist.   History of depression  on lithium , Cymbalta , Wellbutrin  Remeron .  Takes Klonopin  for anxiety.  Check lithium  levels.   BPH  on dutasteride  and tamsulosin .   Chronic indwelling Foley catheter  with abnormal UA.  Follow cultures.  Foley catheter replaced.  Insomnia - Continue home medications     GERD  on PPI.   Vitamin D deficiency started vitamin D 50,000 units p.o.  weekly, follow with PCP    Vitamin B12 level 217, goal >400  Started vitamin B12 1000 mcg IM injection daily during hospital stay, followed by oral supplement.  Follow-up PCP   Anemia due to iron deficiency and low B12 level.  Monitor CBC.  Follow-up with PCP to repeat iron profile after 3 to 6 months.   Right axillary fungal infection started clotrimazole and nystatin powder. Benadryl  as needed for itching     Body mass index is 24.66 kg/m.    DVT prophylaxis: enoxaparin  (LOVENOX ) injection 40 mg Start: 07/25/24 1800    Code Status: Limited: Do not attempt resuscitation (DNR) -DNR-LIMITED -Do Not Intubate/DNI  Family Communication:   Status is: Inpatient Remains inpatient appropriate because: Hopefully discharge in next 24-48 hours   PT Follow up Recs: Other (Comment)07/25/2024 1435  Subjective: Seen at bedside no complaints   Examination:  General exam: Appears calm and comfortable  Respiratory system: Clear to auscultation. Respiratory effort normal. Cardiovascular system: S1 & S2 heard, RRR. No JVD, murmurs, rubs, gallops or clicks. No pedal edema. Gastrointestinal system: Abdomen is nondistended, soft and nontender. No organomegaly or masses felt. Normal bowel sounds heard. Central nervous system: Alert and oriented. No focal neurological deficits. Extremities: Symmetric 5 x 5 power. Skin: No rashes, lesions or ulcers Psychiatry: Judgement and insight appear normal. Mood & affect appropriate.

## 2024-07-27 NOTE — Progress Notes (Signed)
 PROGRESS NOTE    Douglas Price  FMW:[id number] DOB: Aug 02, 1951 DOA: 07/23/2024 PCP: Janey Santos, MD    Brief Narrative:  73 y.o. male with history of Parkinson's disease, diabetes mellitus type 2, hypertension, depression, chronic indwelling Foley catheter was referred to ER from infectious disease office after patient was found to be hypotensive and diaphoretic.  Patient was recently admitted to the hospital in August 2025 for infected decubitus ulcer with Proteus mirabilis bacteremia was on oral antibiotics.  During the stay at skilled nursing facility patient was placed on meropenem and Zyvox and was following up with infectious disease when patient became hypotensive at office.  Was referred to the ER.  Foley was replaced in the ER.  Repeat MRI largely unchanged, ongoing local wound care.  With recommendations from ID, PICC line removed on 11/2, repeat cultures have been sent.   Assessment & Plan:  Septic shock/hypotension secondary to PICC line infection - Concerns of possible underlying PICC line infection/worsening sacral wound.  MRI pelvis shows large decubitus ulcer with possible early osteomyelitis.  Largely unchanged from August.  Seen by ID, recommending line holiday -Repeat blood cultures 11/2-sent -Plans for 2 weeks of Rocephin  and 10 days of Zyvox.  Will likely need midline    History of hypertension  takes Nebivolol  and telmisartan  which we will hold for now until blood pressure is stable.   Diabetes mellitus type 2  takes metformin  we will keep patient on sliding scale coverage.  Last hemoglobin A1c was 5.6 about 5 months ago.   Parkinsonism  on carbidopa .  Follows with Dr. Bryn neurologist.   History of depression  on lithium , Cymbalta , Wellbutrin  Remeron .  Takes Klonopin  for anxiety.  Check lithium  levels.   BPH  on dutasteride  and tamsulosin .   Chronic indwelling Foley catheter  with abnormal UA.  Follow cultures.  Foley catheter  replaced.  Insomnia - Continue home medications     GERD  on PPI.   Vitamin D deficiency started vitamin D 50,000 units p.o. weekly, follow with PCP    Vitamin B12 level 217, goal >400  Started vitamin B12 1000 mcg IM injection daily during hospital stay, followed by oral supplement.  Follow-up PCP   Anemia due to iron deficiency and low B12 level.  Monitor CBC.  Follow-up with PCP to repeat iron profile after 3 to 6 months.   Right axillary fungal infection started clotrimazole and nystatin powder. Benadryl  as needed for itching     Body mass index is 24.66 kg/m.    DVT prophylaxis: enoxaparin  (LOVENOX ) injection 40 mg Start: 07/25/24 1800    Code Status: Limited: Do not attempt resuscitation (DNR) -DNR-LIMITED -Do Not Intubate/DNI  Family Communication:   Status is: Inpatient Remains inpatient appropriate because: Hopefully discharge in next 24-48 hours   PT Follow up Recs: Other (Comment)07/25/2024 1435  Subjective: Seen at bedside no complaints   Examination:  General exam: Appears calm and comfortable  Respiratory system: Clear to auscultation. Respiratory effort normal. Cardiovascular system: S1 & S2 heard, RRR. No JVD, murmurs, rubs, gallops or clicks. No pedal edema. Gastrointestinal system: Abdomen is nondistended, soft and nontender. No organomegaly or masses felt. Normal bowel sounds heard. Central nervous system: Alert and oriented. No focal neurological deficits. Extremities: Symmetric 5 x 5 power. Skin: No rashes, lesions or ulcers Psychiatry: Judgement and insight appear normal. Mood & affect appropriate.            Wound 07/24/24 1135 Pressure Injury Coccyx Bilateral Stage 4 - Full thickness  tissue loss with exposed bone, tendon or muscle. (Active)     Wound 07/24/24 1157 Pressure Injury Buttocks Right Stage 3 -  Full thickness tissue loss. Subcutaneous fat may be visible but bone, tendon or muscle are NOT exposed. (Active)     Diet  Orders (From admission, onward)     Start     Ordered   07/24/24 0550  Diet heart healthy/carb modified Room service appropriate? Yes; Fluid consistency: Thin  Diet effective now       Question Answer Comment  Diet-HS Snack? Nothing   Room service appropriate? Yes   Fluid consistency: Thin      07/24/24 0549            Objective: Vitals:   07/26/24 2352 07/27/24 0415 07/27/24 0732 07/27/24 0845  BP: 117/73 131/86  113/73  Pulse: 97 86    Resp: 20 20  15   Temp: 98.9 F (37.2 C) 98.4 F (36.9 C)  98.1 F (36.7 C)  TempSrc: Oral Oral Oral Oral  SpO2: 95% 96%  (!) 83%  Weight:  88.3 kg    Height:        Intake/Output Summary (Last 24 hours) at 07/27/2024 1209 Last data filed at 07/27/2024 9147 Gross per 24 hour  Intake 1779.22 ml  Output 1500 ml  Net 279.22 ml   Filed Weights   07/24/24 1135 07/26/24 0540 07/27/24 0415  Weight: 90.7 kg 89.5 kg 88.3 kg    Scheduled Meds:  ascorbic acid  500 mg Oral Daily   bisacodyl   10 mg Oral QHS   bisacodyl   10 mg Oral Once   buPROPion   300 mg Oral Daily   carbidopa -levodopa   1 tablet Oral TID WC   Chlorhexidine  Gluconate Cloth  6 each Topical Daily   clotrimazole   Topical BID   collagenase    Topical Daily   cyanocobalamin  1,000 mcg Intramuscular Q1200   Followed by   NOREEN ON 08/01/2024] vitamin B-12  1,000 mcg Oral Daily   DULoxetine   120 mg Oral QHS   dutasteride   0.5 mg Oral Daily   enoxaparin  (LOVENOX ) injection  40 mg Subcutaneous QPM   feeding supplement (GLUCERNA SHAKE)  237 mL Oral TID BM   insulin  aspart  0-9 Units Subcutaneous TID WC   iron polysaccharides  150 mg Oral Daily   lithium  carbonate  150 mg Oral Daily   liver oil-zinc  oxide   Topical BID   magnesium  oxide  400 mg Oral BID   mirtazapine   15 mg Oral QHS   nystatin   Topical BID   pantoprazole   40 mg Oral Daily   polyethylene glycol  17 g Oral BID   pregabalin   75 mg Oral BID   sodium chloride  flush  10-40 mL Intracatheter Q12H   tamsulosin    0.4 mg Oral QHS   Vitamin D (Ergocalciferol)  50,000 Units Oral Q7 days   Continuous Infusions:  cefTRIAXone  (ROCEPHIN )  IV     DAPTOmycin      Nutritional status     Body mass index is 24.33 kg/m.  Data Reviewed:   CBC: Recent Labs  Lab 07/23/24 1623 07/23/24 2250 07/24/24 0702 07/25/24 0229 07/26/24 0807 07/27/24 0253  WBC 16.9* 18.6* 15.7* 11.1* 13.9* 13.2*  NEUTROABS 12,996* 14.8* 11.6* 6.8  --   --   HGB 11.2* 10.2* 9.7* 10.2* 11.0* 10.7*  HCT 35.5* 33.0* 31.8* 32.7* 35.4* 34.0*  MCV 84.9 88.2 87.6 85.8 86.3 85.4  PLT 486* 440* 437* 407* 404*  408*   Basic Metabolic Panel: Recent Labs  Lab 07/23/24 2250 07/24/24 0702 07/25/24 0229 07/25/24 0900 07/26/24 0807 07/27/24 0253  NA 139 138 140  --  139 139  K 4.1 3.8 4.0  --  4.0 4.1  CL 101 100 102  --  102 102  CO2 27 26 29   --  26 27  GLUCOSE 108* 103* 89  --  92 96  BUN 16 15 9   --  9 14  CREATININE 1.09 0.86 0.76  --  0.69 0.79  CALCIUM 7.9* 8.0* 8.2*  --  8.2* 8.2*  MG  --   --   --  1.7 1.7 1.8  PHOS  --   --   --  3.1 3.2 3.3   GFR: Estimated Creatinine Clearance: 101.2 mL/min (by C-G formula based on SCr of 0.79 mg/dL). Liver Function Tests: Recent Labs  Lab 07/23/24 1623 07/23/24 2250 07/24/24 0702 07/25/24 0229  AST 12 15 14* 11*  ALT 16 22 22 8   ALKPHOS  --  71 69 67  BILITOT 0.3 0.4 0.5 0.5  PROT 5.8* 5.5* 5.3* 5.5*  ALBUMIN   --  2.3* 2.2* 2.2*   No results for input(s): LIPASE, AMYLASE in the last 168 hours. No results for input(s): AMMONIA in the last 168 hours. Coagulation Profile: No results for input(s): INR, PROTIME in the last 168 hours. Cardiac Enzymes: No results for input(s): CKTOTAL, CKMB, CKMBINDEX, TROPONINI in the last 168 hours. BNP (last 3 results) No results for input(s): PROBNP in the last 8760 hours. HbA1C: No results for input(s): HGBA1C in the last 72 hours. CBG: Recent Labs  Lab 07/26/24 0611 07/26/24 1103 07/26/24 1525  07/26/24 2100 07/27/24 0607  GLUCAP 99 121* 109* 159* 99   Lipid Profile: No results for input(s): CHOL, HDL, LDLCALC, TRIG, CHOLHDL, LDLDIRECT in the last 72 hours. Thyroid Function Tests: No results for input(s): TSH, T4TOTAL, FREET4, T3FREE, THYROIDAB in the last 72 hours. Anemia Panel: Recent Labs    07/25/24 0900  VITAMINB12 217  FOLATE 11.0  TIBC 230*  IRON 14*   Sepsis Labs: Recent Labs  Lab 07/24/24 0216 07/24/24 0702 07/24/24 1110 07/25/24 0900  PROCALCITON  --   --   --  <0.10  LATICACIDVEN 2.3* 1.9 1.8  --     Recent Results (from the past 240 hours)  Culture, blood (single) w Reflex to ID Panel     Status: None (Preliminary result)   Collection Time: 07/23/24  4:32 AM   Specimen: Vein; Blood  Result Value Ref Range Status   MICRO NUMBER: [id number]  Preliminary   SPECIMEN QUALITY: Adequate  Preliminary   Source BLOOD 2  Preliminary   STATUS: PRELIMINARY  Preliminary   Result:   Preliminary    No growth to date. Culture is continuously monitored for a total of 120 hours incubation. A change in status will result in a phone report followed by an updated printed culture report.   COMMENT: Aerobic and anaerobic bottle received.  Preliminary  Culture, blood (single) w Reflex to ID Panel     Status: None (Preliminary result)   Collection Time: 07/23/24  4:23 PM   Specimen: Vein; Blood  Result Value Ref Range Status   MICRO NUMBER: [id number]  Preliminary   SPECIMEN QUALITY: Adequate  Preliminary   Source BLOOD 1  Preliminary   STATUS: PRELIMINARY  Preliminary   Result:   Preliminary    No growth to date. Culture is continuously monitored for a  total of 120 hours incubation. A change in status will result in a phone report followed by an updated printed culture report.   COMMENT: Aerobic and anaerobic bottle received.  Preliminary  Urine Culture     Status: Abnormal   Collection Time: 07/24/24 12:18 AM   Specimen: Urine, Catheterized   Result Value Ref Range Status   Specimen Description URINE, CATHETERIZED  Final   Special Requests   Final    NONE Performed at [facility], [address], [place], [place] [address]    Culture 20,000 COLONIES/mL YEAST (A)  Final   Report Status 07/25/2024 FINAL  Final  Blood culture (routine x 2)     Status: None (Preliminary result)   Collection Time: 07/24/24  2:09 AM   Specimen: BLOOD LEFT ARM  Result Value Ref Range Status   Specimen Description BLOOD LEFT ARM  Final   Special Requests   Final    BOTTLES DRAWN AEROBIC AND ANAEROBIC Blood Culture adequate volume   Culture   Final    NO GROWTH 3 DAYS Performed at [facility], [address], [place], [place] [address]    Report Status PENDING  Incomplete  Blood culture (routine x 2)     Status: None (Preliminary result)   Collection Time: 07/24/24  2:10 AM   Specimen: BLOOD LEFT ARM  Result Value Ref Range Status   Specimen Description BLOOD LEFT ARM  Final   Special Requests   Final    BOTTLES DRAWN AEROBIC AND ANAEROBIC Blood Culture adequate volume   Culture   Final    NO GROWTH 3 DAYS Performed at [facility], [address], [place], [place] [address]    Report Status PENDING  Incomplete  Culture, blood (Routine X 2) w Reflex to ID Panel     Status: None (Preliminary result)   Collection Time: 07/26/24  8:07 AM   Specimen: BLOOD LEFT ARM  Result Value Ref Range Status   Specimen Description BLOOD LEFT ARM  Final   Special Requests   Final    BOTTLES DRAWN AEROBIC AND ANAEROBIC Blood Culture results may not be optimal due to an inadequate volume of blood received in culture bottles   Culture   Final    NO GROWTH < 24 HOURS Performed at [facility], [address] [place], [place] [address]    Report Status PENDING  Incomplete  Culture, blood (Routine X 2) w Reflex to ID Panel     Status: None (Preliminary result)   Collection Time: 07/26/24  8:08 AM   Specimen: BLOOD RIGHT HAND   Result Value Ref Range Status   Specimen Description BLOOD RIGHT HAND  Final   Special Requests   Final    BOTTLES DRAWN AEROBIC AND ANAEROBIC Blood Culture results may not be optimal due to an inadequate volume of blood received in culture bottles   Culture   Final    NO GROWTH < 24 HOURS Performed at [facility], [address], [place], [place] [address]    Report Status PENDING  Incomplete         Radiology Studies: No results found.         LOS: 3 days   Time spent= 35 mins    Burgess JAYSON Dare, MD [facility]  If 7PM-7AM, please contact night-coverage  07/27/2024, 12:09 PM

## 2024-07-27 NOTE — Plan of Care (Signed)
  Problem: Education: Goal: Ability to describe self-care measures that may prevent or decrease complications (Diabetes Survival Skills Education) will improve Outcome: Progressing   Problem: Coping: Goal: Ability to adjust to condition or change in health will improve Outcome: Progressing

## 2024-07-27 NOTE — TOC Initial Note (Signed)
 Transition of Care [facility]) - Initial/Assessment Note    Patient Details  Name: Douglas Price MRN: [id number] Date of Birth: 1951/02/28  Transition of Care [facility]) CM/SW Contact:    Luise JAYSON Pan, LCSWA Phone Number: 07/27/2024, 11:35 AM  Clinical Narrative:    Patient from [facility] snf. Per facility patient can return when medically ready.   CSW will continue to follow.        Expected Discharge Plan: Long Term Nursing Home Barriers to Discharge: Continued Medical Work up   Patient Goals and CMS Choice Patient states their goals for this hospitalization and ongoing recovery are:: To return to ltc snf   Choice offered to / list presented to : NA      Expected Discharge Plan and Services In-house Referral: Clinical Social Work   Post Acute Care Choice: Skilled Nursing Facility Living arrangements for the past 2 months: Skilled Nursing Facility                                      Prior Living Arrangements/Services Living arrangements for the past 2 months: Skilled Nursing Facility Lives with:: Facility Resident Patient language and need for interpreter reviewed:: Yes Do you feel safe going back to the place where you live?: Yes      Need for Family Participation in Patient Care: No (Comment) Care giver support system in place?: No (comment) Current home services: DME (walker/ cane/ rollator) Criminal Activity/Legal Involvement Pertinent to Current Situation/Hospitalization: No - Comment as needed  Activities of Daily Living      Permission Sought/Granted Permission sought to share information with : Facility Industrial/product Designer granted to share information with : No (from a facility)     Permission granted to share info w AGENCY: [facility]        Emotional Assessment Appearance:: Appears stated age Attitude/Demeanor/Rapport: Engaged Affect (typically observed): Stable Orientation: : Oriented to Situation, Oriented to   Time, Oriented to Place, Oriented to Self Alcohol / Substance Use: Not Applicable Psych Involvement: No (comment)  Admission diagnosis:  Hypotension [I95.9] Sepsis, due to unspecified organism, unspecified whether acute organ dysfunction present [facility]) [A41.9] Patient Active Problem List   Diagnosis Date Noted   Hypotension 07/24/2024   Sacral wound 07/24/2024   Diabetes mellitus type 2 in nonobese (HCC) 07/24/2024   Anemia 07/24/2024   Essential hypertension 07/24/2024   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   Acute urinary retention 02/18/2024   Polypharmacy 02/15/2024   Hypokalemia 02/15/2024   Multiple wounds of skin 02/14/2024   Sepsis due to cellulitis (HCC) 02/14/2024   Transaminitis 02/14/2024   Fall at home, initial encounter 02/14/2024   Gait disturbance 02/14/2024   Depression 02/14/2024   Insomnia 02/14/2024   BPH (benign prostatic hyperplasia) 02/14/2024   GERD (gastroesophageal reflux disease) 02/14/2024   Pain due to onychomycosis of toenails of both feet 01/31/2023   Parkinson's disease (HCC) 11/25/2017   Diabetic polyneuropathy associated with type 2 diabetes mellitus (HCC) 11/25/2017   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   S/P lumbar spinal fusion 03/14/2016   S/P lumbar laminectomy 09/08/2015   Sleep disturbance 03/24/2014   PCP:  Janey Santos, MD Pharmacy:   [id number] [place], [place] - [address] AT [facility] [address] [place] [address] Phone: [phone] Fax: [phone]  [facility] [address] [place] [address] Phone: [phone] Fax: [phone]  [facility] - [place], [place] - [address] [place] [address] Phone: [phone] Fax: [phone]  [facility] [place], [place] - [place] [address] [place] [address] Phone: [phone] Fax: [phone]     Social Drivers of Health (SDOH) Social History: SDOH Screenings   Food Insecurity: No Food Insecurity (07/25/2024)  Housing: Low Risk  (07/25/2024)  Transportation Needs: No Transportation Needs (07/25/2024)  Utilities: Not At Risk (07/25/2024)  Depression (PHQ2-9): Medium Risk (02/11/2023)  Financial Resource Strain: Low Risk  (02/07/2023)  Social Connections: Moderately Isolated (07/25/2024)  Tobacco Use: High Risk (07/24/2024)   SDOH Interventions:     Readmission Risk Interventions     No data to display

## 2024-07-27 NOTE — Progress Notes (Signed)
 Brief ID note -   Waiting for blood cultures to clear after removal of PICC line  Then will plan midline tomorrow 11/4 to finish out treatment for line infection.   Of note we will not use linezolid d/t multiple drug interactions and concern for Serotonin syndrome risk. Daptomycin + Ceftriaxone  will be the plan  Baseline CK in AM    Corean Fireman, MSN, NP-C [facility]  [email or web address] Pager: [phone] Office: [phone] [facility] Main Line: [phone] *Secure Chat Communication Welcome  Total Encounter Time: no charge  D/W Dr. Caleen

## 2024-07-27 NOTE — Progress Notes (Signed)
 Physical Therapy Treatment Patient Details Name: Douglas Price MRN: [id number] DOB: [birth year] Today's Date: 07/27/2024   History of Present Illness Pt is a 73 y/o male admitted 10/30 from infection ds office after being found hypotensive and diaphoretic.  In the [facility] ED pt admitted to floor with concern for sepsis of a decubitus ulcer undergoing treatment.  PMHx:  DDD, DM, peripheral neuropathy, RLS, spinal stenosis, s/p lumbar lami/microdiscectomy.    PT Comments  Pt tolerated more activity today although sacral pain remains an issue. Did sit EOB. Pt is long term care resident at facility and he reports he has been working on standing and ambulation with therapy. Recommend return to facility at prior level of care.     If plan is discharge home, recommend the following: Two people to help with walking and/or transfers;Assistance with cooking/housework;Two people to help with bathing/dressing/bathroom;Assist for transportation   Can travel by private vehicle        Equipment Recommendations  None recommended by PT    Recommendations for Other Services       Precautions / Restrictions Precautions Precautions: Other (comment) (skin breakdown) Recall of Precautions/Restrictions: Impaired     Mobility  Bed Mobility Overal bed mobility: Needs Assistance Bed Mobility: Rolling, Supine to Sit, Sit to Supine Rolling: Mod assist   Supine to sit: +2 for physical assistance, Mod assist Sit to supine: +2 for physical assistance, Mod assist   General bed mobility comments: Assist to bring legs off of bed, elevate trunk into sitting, and bring hips to EOB. Assist to lower trunk and bring legs back up into bed.    Transfers                   General transfer comment: NT    Ambulation/Gait               General Gait Details: NT   Stairs             Wheelchair Mobility     Tilt Bed    Modified Rankin (Stroke Patients Only)       Balance Overall  balance assessment: Needs assistance Sitting-balance support: Single extremity supported, No upper extremity supported, Feet supported Sitting balance-Leahy Scale: Poor Sitting balance - Comments: Initially mod assist LUE support for static sitting. Progress to no UE with intermittent CGA.                                    Communication Communication Communication: No apparent difficulties  Cognition Arousal: Alert Behavior During Therapy: Flat affect, WFL for tasks assessed/performed   PT - Cognitive impairments: No family/caregiver present to determine baseline                         Following commands: Impaired Following commands impaired: Only follows one step commands consistently, Follows one step commands with increased time    Cueing Cueing Techniques: Verbal cues  Exercises      General Comments        Pertinent Vitals/Pain Pain Assessment Pain Assessment: Faces Faces Pain Scale: Hurts even more Pain Location: decubitus Pain Descriptors / Indicators: Burning, Sore Pain Intervention(s): Monitored during session, Repositioned, Other (comment) (Transferred pt to bed with air mattress)    Home Living  Prior Function            PT Goals (current goals can now be found in the care plan section) Progress towards PT goals: Progressing toward goals    Frequency    Min 1X/week      PT Plan      Co-evaluation              AM-PAC PT 6 Clicks Mobility   Outcome Measure  Help needed turning from your back to your side while in a flat bed without using bedrails?: A Lot Help needed moving from lying on your back to sitting on the side of a flat bed without using bedrails?: Total Help needed moving to and from a bed to a chair (including a wheelchair)?: Total Help needed standing up from a chair using your arms (e.g., wheelchair or bedside chair)?: Total Help needed to walk in hospital room?:  Total Help needed climbing 3-5 steps with a railing? : Total 6 Click Score: 7    End of Session   Activity Tolerance: Patient limited by pain Patient left: in bed;with call bell/phone within reach;with nursing/sitter in room Nurse Communication: Mobility status PT Visit Diagnosis: Pain;Other abnormalities of gait and mobility (R26.89) Pain - part of body:  (sacral)     Time: 8866-8840 PT Time Calculation (min) (ACUTE ONLY): 26 min  Charges:    $Therapeutic Activity: 23-37 mins PT General Charges $$ ACUTE PT VISIT: 1 Visit                     [facility] PT Acute Rehabilitation Services Office [phone]    Rodgers ORN [facility] 07/27/2024, 1:20 PM

## 2024-07-27 NOTE — Plan of Care (Signed)

## 2024-07-28 ENCOUNTER — Other Ambulatory Visit: Payer: Self-pay

## 2024-07-28 DIAGNOSIS — I950 Idiopathic hypotension: Secondary | ICD-10-CM | POA: Diagnosis not present

## 2024-07-28 LAB — CBC WITH DIFFERENTIAL/PLATELET
Absolute Lymphocytes: 1893 {cells}/uL (ref 850–3900)
Absolute Monocytes: 1082 {cells}/uL — ABNORMAL HIGH (ref 200–950)
Basophils Absolute: 118 {cells}/uL (ref 0–200)
Basophils Relative: 0.7 %
Eosinophils Absolute: 811 {cells}/uL — ABNORMAL HIGH (ref 15–500)
Eosinophils Relative: 4.8 %
HCT: 35.5 % — ABNORMAL LOW (ref 38.5–50.0)
Hemoglobin: 11.2 g/dL — ABNORMAL LOW (ref 13.2–17.1)
MCH: 26.8 pg — ABNORMAL LOW (ref 27.0–33.0)
MCHC: 31.5 g/dL — ABNORMAL LOW (ref 32.0–36.0)
MCV: 84.9 fL (ref 80.0–100.0)
MPV: 9.9 fL (ref 7.5–12.5)
Monocytes Relative: 6.4 %
Neutro Abs: 12996 {cells}/uL — ABNORMAL HIGH (ref 1500–7800)
Neutrophils Relative %: 76.9 %
Platelets: 486 Thousand/uL — ABNORMAL HIGH (ref 140–400)
RBC: 4.18 Million/uL — ABNORMAL LOW (ref 4.20–5.80)
RDW: 14.6 % (ref 11.0–15.0)
Total Lymphocyte: 11.2 %
WBC: 16.9 Thousand/uL — ABNORMAL HIGH (ref 3.8–10.8)

## 2024-07-28 LAB — CBC
HCT: 34.1 % — ABNORMAL LOW (ref 39.0–52.0)
Hemoglobin: 10.6 g/dL — ABNORMAL LOW (ref 13.0–17.0)
MCH: 26.8 pg (ref 26.0–34.0)
MCHC: 31.1 g/dL (ref 30.0–36.0)
MCV: 86.1 fL (ref 80.0–100.0)
Platelets: 372 K/uL (ref 150–400)
RBC: 3.96 MIL/uL — ABNORMAL LOW (ref 4.22–5.81)
RDW: 15.3 % (ref 11.5–15.5)
WBC: 13.6 K/uL — ABNORMAL HIGH (ref 4.0–10.5)
nRBC: 0 % (ref 0.0–0.2)

## 2024-07-28 LAB — CULTURE, BLOOD (SINGLE)
MICRO NUMBER:: 17170340
MICRO NUMBER:: 17170341
Result:: NO GROWTH
Result:: NO GROWTH
SPECIMEN QUALITY:: ADEQUATE
SPECIMEN QUALITY:: ADEQUATE

## 2024-07-28 LAB — COMPLETE METABOLIC PANEL WITHOUT GFR
AG Ratio: 1.3 (calc) (ref 1.0–2.5)
ALT: 16 U/L (ref 9–46)
AST: 12 U/L (ref 10–35)
Albumin: 3.3 g/dL — ABNORMAL LOW (ref 3.6–5.1)
Alkaline phosphatase (APISO): 87 U/L (ref 35–144)
BUN/Creatinine Ratio: 23 (calc) — ABNORMAL HIGH (ref 6–22)
BUN: 15 mg/dL (ref 7–25)
CO2: 28 mmol/L (ref 20–32)
Calcium: 8.6 mg/dL (ref 8.6–10.3)
Chloride: 100 mmol/L (ref 98–110)
Creat: 0.65 mg/dL — ABNORMAL LOW (ref 0.70–1.28)
Globulin: 2.5 g/dL (ref 1.9–3.7)
Glucose, Bld: 82 mg/dL (ref 65–99)
Potassium: 4.3 mmol/L (ref 3.5–5.3)
Sodium: 139 mmol/L (ref 135–146)
Total Bilirubin: 0.3 mg/dL (ref 0.2–1.2)
Total Protein: 5.8 g/dL — ABNORMAL LOW (ref 6.1–8.1)

## 2024-07-28 LAB — BASIC METABOLIC PANEL WITH GFR
Anion gap: 11 (ref 5–15)
BUN: 15 mg/dL (ref 8–23)
CO2: 25 mmol/L (ref 22–32)
Calcium: 8.2 mg/dL — ABNORMAL LOW (ref 8.9–10.3)
Chloride: 100 mmol/L (ref 98–111)
Creatinine, Ser: 0.72 mg/dL (ref 0.61–1.24)
GFR, Estimated: >60 mL/min (ref >60)
Glucose, Bld: 100 mg/dL — ABNORMAL HIGH (ref 70–99)
Potassium: 4.2 mmol/L (ref 3.5–5.1)
Sodium: 136 mmol/L (ref 135–145)

## 2024-07-28 LAB — GLUCOSE, CAPILLARY
Glucose-Capillary: 122 mg/dL — ABNORMAL HIGH (ref 70–99)
Glucose-Capillary: 126 mg/dL — ABNORMAL HIGH (ref 70–99)
Glucose-Capillary: 156 mg/dL — ABNORMAL HIGH (ref 70–99)
Glucose-Capillary: 114 mg/dL — ABNORMAL HIGH (ref 70–99)

## 2024-07-28 LAB — CK: Total CK: 70 U/L (ref 49–397)

## 2024-07-28 LAB — PHOSPHORUS: Phosphorus: 3.6 mg/dL (ref 2.5–4.6)

## 2024-07-28 LAB — MAGNESIUM: Magnesium: 1.9 mg/dL (ref 1.7–2.4)

## 2024-07-28 MED ORDER — OXYCODONE HCL 5 MG PO TABS
5.0000 mg | ORAL_TABLET | ORAL | Status: DC | PRN
  Administered 2024-07-28 – 2024-07-29 (×3): 5 mg via ORAL
  Filled 2024-07-28 (×2): qty 1

## 2024-07-28 MED ORDER — SODIUM CHLORIDE 0.9% FLUSH: 10.0000 mL | INTRAVENOUS | Status: DC | PRN

## 2024-07-28 MED ORDER — SODIUM CHLORIDE 0.9% FLUSH
10.0000 mL | Freq: Two times a day (BID) | INTRAVENOUS | Status: DC
  Administered 2024-07-28: 10 mL

## 2024-07-28 MED ORDER — DAPTOMYCIN IV (FOR PTA / DISCHARGE USE ONLY): 700.0000 mg | INTRAVENOUS | 0 refills | Status: AC

## 2024-07-28 MED ORDER — CEFTRIAXONE IV (FOR PTA / DISCHARGE USE ONLY): 2.0000 g | INTRAVENOUS | 0 refills | Status: AC

## 2024-07-28 NOTE — Progress Notes (Signed)
 PROGRESS NOTE    Douglas Price  FMW:[id number] DOB: March 13, 1951 DOA: 07/23/2024 PCP: Janey Santos, MD    Brief Narrative:  73 y.o. male with history of Parkinson's disease, diabetes mellitus type 2, hypertension, depression, chronic indwelling Foley catheter was referred to ER from infectious disease office after patient was found to be hypotensive and diaphoretic.  Patient was recently admitted to the hospital in August 2025 for infected decubitus ulcer with Proteus mirabilis bacteremia was on oral antibiotics.  During the stay at skilled nursing facility patient was placed on meropenem and Zyvox and was following up with infectious disease when patient became hypotensive at office.  Was referred to the ER.  Foley was replaced in the ER.  Repeat MRI largely unchanged, ongoing local wound care.  With recommendations from ID, PICC line removed on 11/2, repeat cultures h have remained negative.  Plans for PICC line placement followed by daptomycin and Rocephin  until 08/03/2024.   Assessment & Plan:  Septic shock/hypotension secondary to PICC line infection - Concerns of possible underlying PICC line infection/worsening sacral wound.  MRI pelvis shows large decubitus ulcer with possible early osteomyelitis.  Largely unchanged from August.  Seen by ID, recommending line holiday -Repeat blood cultures 11/2-negative - Plan for daptomycin and Rocephin  until 08/03/2024 -PICC line placement ordered    History of hypertension  takes Nebivolol  and telmisartan  which we will hold for now until blood pressure is stable.   Diabetes mellitus type 2  takes metformin  we will keep patient on sliding scale coverage.  Last hemoglobin A1c was 5.6 about 5 months ago.   Parkinsonism  on carbidopa .  Follows with Dr. Bryn neurologist.   History of depression  on lithium , Cymbalta , Wellbutrin  Remeron .  Takes Klonopin  for anxiety.  Check lithium  levels.   BPH  on dutasteride  and tamsulosin .   Chronic  indwelling Foley catheter  with abnormal UA.  Follow cultures.  Foley catheter replaced.  Insomnia - Continue home medications     GERD  on PPI.   Vitamin D deficiency started vitamin D 50,000 units p.o. weekly, follow with PCP    Vitamin B12 level 217, goal >400  Started vitamin B12 1000 mcg IM injection daily during hospital stay, followed by oral supplement.  Follow-up PCP   Anemia due to iron deficiency and low B12 level.  Monitor CBC.  Follow-up with PCP to repeat iron profile after 3 to 6 months.   Right axillary fungal infection started clotrimazole and nystatin powder. Benadryl  as needed for itching     Body mass index is 24.66 kg/m.    DVT prophylaxis: enoxaparin  (LOVENOX ) injection 40 mg Start: 07/25/24 1800    Code Status: Limited: Do not attempt resuscitation (DNR) -DNR-LIMITED -Do Not Intubate/DNI  Family Communication:   Status is: Inpatient Remains inpatient appropriate because: Hopefully discharge in next 24-48 hours   PT Follow up Recs: Other (Comment)07/25/2024 1435  Subjective: Seen at bedside no complaints Family at bedside  Examination:  General exam: Appears calm and comfortable  Respiratory system: Clear to auscultation. Respiratory effort normal. Cardiovascular system: S1 & S2 heard, RRR. No JVD, murmurs, rubs, gallops or clicks. No pedal edema. Gastrointestinal system: Abdomen is nondistended, soft and nontender. No organomegaly or masses felt. Normal bowel sounds heard. Central nervous system: Alert and oriented. No focal neurological deficits. Extremities: Symmetric 5 x 5 power. Skin: No rashes, lesions or ulcers Psychiatry: Judgement and insight appear normal. Mood & affect appropriate.  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 (Active)     Wound 07/24/24 1157 Pressure Injury Buttocks Right Stage 3 -  Full thickness tissue loss.  Subcutaneous fat may be visible but bone, tendon or muscle are NOT exposed. (Active)     Diet Orders (From admission, onward)     Start     Ordered   07/24/24 0550  Diet heart healthy/carb modified Room service appropriate? Yes; Fluid consistency: Thin  Diet effective now       Question Answer Comment  Diet-HS Snack? Nothing   Room service appropriate? Yes   Fluid consistency: Thin      07/24/24 0549            Objective: Vitals:   07/27/24 2010 07/27/24 2351 07/28/24 0645 07/28/24 0745  BP: 119/64 118/69 135/74 117/88  Pulse: (!) 101 92 71 100  Resp: 16 17 19 18   Temp: 99.5 F (37.5 C) 99.1 F (37.3 C) 98.1 F (36.7 C) 98.1 F (36.7 C)  TempSrc: Oral Oral Oral Oral  SpO2: 96% 95% 98% 98%  Weight:      Height:        Intake/Output Summary (Last 24 hours) at 07/28/2024 1216 Last data filed at 07/28/2024 0645 Gross per 24 hour  Intake 196.65 ml  Output 1250 ml  Net -1053.35 ml   Filed Weights   07/24/24 1135 07/26/24 0540 07/27/24 0415  Weight: 90.7 kg 89.5 kg 88.3 kg    Scheduled Meds:  ascorbic acid  500 mg Oral Daily   bisacodyl   10 mg Oral QHS   bisacodyl   10 mg Oral Once   buPROPion   300 mg Oral Daily   carbidopa -levodopa   1 tablet Oral TID WC   Chlorhexidine  Gluconate Cloth  6 each Topical Daily   clotrimazole   Topical BID   collagenase    Topical Daily   cyanocobalamin  1,000 mcg Intramuscular Q1200   Followed by   NOREEN ON 08/01/2024] vitamin B-12  1,000 mcg Oral Daily   DULoxetine   120 mg Oral QHS   dutasteride   0.5 mg Oral Daily   enoxaparin  (LOVENOX ) injection  40 mg Subcutaneous QPM   feeding supplement (GLUCERNA SHAKE)  237 mL Oral TID BM   insulin  aspart  0-9 Units Subcutaneous TID WC   iron polysaccharides  150 mg Oral Daily   lithium  carbonate  150 mg Oral Daily   liver oil-zinc  oxide   Topical BID   magnesium  oxide  400 mg Oral BID   mirtazapine   15 mg Oral QHS   nystatin   Topical BID   pantoprazole   40 mg Oral Daily    polyethylene glycol  17 g Oral BID   pregabalin   75 mg Oral BID   sodium chloride  flush  10-40 mL Intracatheter Q12H   tamsulosin   0.4 mg Oral QHS   Vitamin D (Ergocalciferol)  50,000 Units Oral Q7 days   Continuous Infusions:  cefTRIAXone  (ROCEPHIN )  IV Stopped (07/27/24 1639)   DAPTOmycin Stopped (07/28/24 0027)    Nutritional status     Body mass index is 24.33 kg/m.  Data Reviewed:   CBC: Recent Labs  Lab 07/23/24 1623 07/23/24 2250 07/24/24 0702 07/25/24 0229 07/26/24 0807 07/27/24 0253 07/28/24 0448  WBC 16.9* 18.6* 15.7* 11.1* 13.9* 13.2* 13.6*  NEUTROABS 12,996* 14.8* 11.6* 6.8  --   --   --   HGB 11.2* 10.2* 9.7* 10.2* 11.0* 10.7* 10.6*  HCT 35.5* 33.0* 31.8* 32.7* 35.4* 34.0* 34.1*  MCV 84.9 88.2 87.6 85.8 86.3 85.4 86.1  PLT 486* 440* 437* 407* 404* 408* 372   Basic Metabolic Panel: Recent Labs  Lab 07/24/24 0702 07/25/24 0229 07/25/24 0900 07/26/24 0807 07/27/24 0253 07/28/24 0448  NA 138 140  --  139 139 136  K 3.8 4.0  --  4.0 4.1 4.2  CL 100 102  --  102 102 100  CO2 26 29  --  26 27 25   GLUCOSE 103* 89  --  92 96 100*  BUN 15 9  --  9 14 15   CREATININE 0.86 0.76  --  0.69 0.79 0.72  CALCIUM 8.0* 8.2*  --  8.2* 8.2* 8.2*  MG  --   --  1.7 1.7 1.8 1.9  PHOS  --   --  3.1 3.2 3.3 3.6   GFR: Estimated Creatinine Clearance: 101.2 mL/min (by C-G formula based on SCr of 0.72 mg/dL). Liver Function Tests: Recent Labs  Lab 07/23/24 1623 07/23/24 2250 07/24/24 0702 07/25/24 0229  AST 12 15 14* 11*  ALT 16 22 22 8   ALKPHOS  --  71 69 67  BILITOT 0.3 0.4 0.5 0.5  PROT 5.8* 5.5* 5.3* 5.5*  ALBUMIN   --  2.3* 2.2* 2.2*   No results for input(s): LIPASE, AMYLASE in the last 168 hours. No results for input(s): AMMONIA in the last 168 hours. Coagulation Profile: No results for input(s): INR, PROTIME in the last 168 hours. Cardiac Enzymes: Recent Labs  Lab 07/28/24 0448  CKTOTAL 70   BNP (last 3 results) No results for  input(s): PROBNP in the last 8760 hours. HbA1C: No results for input(s): HGBA1C in the last 72 hours. CBG: Recent Labs  Lab 07/27/24 1407 07/27/24 1722 07/27/24 2051 07/28/24 0744 07/28/24 1158  GLUCAP 182* 101* 160* 122* 126*   Lipid Profile: No results for input(s): CHOL, HDL, LDLCALC, TRIG, CHOLHDL, LDLDIRECT in the last 72 hours. Thyroid Function Tests: No results for input(s): TSH, T4TOTAL, FREET4, T3FREE, THYROIDAB in the last 72 hours. Anemia Panel: No results for input(s): VITAMINB12, FOLATE, FERRITIN, TIBC, IRON, RETICCTPCT in the last 72 hours. Sepsis Labs: Recent Labs  Lab 07/24/24 0216 07/24/24 0702 07/24/24 1110 07/25/24 0900  PROCALCITON  --   --   --  <0.10  LATICACIDVEN 2.3* 1.9 1.8  --     Recent Results (from the past 240 hours)  Culture, blood (single) w Reflex to ID Panel     Status: None (Preliminary result)   Collection Time: 07/23/24  4:32 AM   Specimen: Vein; Blood  Result Value Ref Range Status   MICRO NUMBER: [id number]  Preliminary   SPECIMEN QUALITY: Adequate  Preliminary   Source BLOOD 2  Preliminary   STATUS: PRELIMINARY  Preliminary   Result:   Preliminary    No growth to date. Culture is continuously monitored for a total of 120 hours incubation. A change in status will result in a phone report followed by an updated printed culture report.   COMMENT: Aerobic and anaerobic bottle received.  Preliminary  Culture, blood (single) w Reflex to ID Panel     Status: None (Preliminary result)   Collection Time: 07/23/24  4:23 PM   Specimen: Vein; Blood  Result Value Ref Range Status   MICRO NUMBER: [id number]  Preliminary   SPECIMEN QUALITY: Adequate  Preliminary   Source BLOOD 1  Preliminary   STATUS: PRELIMINARY  Preliminary   Result:   Preliminary  No growth to date. Culture is continuously monitored for a total of 120 hours incubation. A change in status will result in a phone report followed by an  updated printed culture report.   COMMENT: Aerobic and anaerobic bottle received.  Preliminary  Urine Culture     Status: Abnormal   Collection Time: 07/24/24 12:18 AM   Specimen: Urine, Catheterized  Result Value Ref Range Status   Specimen Description URINE, CATHETERIZED  Final   Special Requests   Final    NONE Performed at [facility], [address] [place], [place] [address]    Culture 20,000 COLONIES/mL YEAST (A)  Final   Report Status 07/25/2024 FINAL  Final  Blood culture (routine x 2)     Status: None (Preliminary result)   Collection Time: 07/24/24  2:09 AM   Specimen: BLOOD LEFT ARM  Result Value Ref Range Status   Specimen Description BLOOD LEFT ARM  Final   Special Requests   Final    BOTTLES DRAWN AEROBIC AND ANAEROBIC Blood Culture adequate volume   Culture   Final    NO GROWTH 4 DAYS Performed at [facility], [address] [place], [place] [address]    Report Status PENDING  Incomplete  Blood culture (routine x 2)     Status: None (Preliminary result)   Collection Time: 07/24/24  2:10 AM   Specimen: BLOOD LEFT ARM  Result Value Ref Range Status   Specimen Description BLOOD LEFT ARM  Final   Special Requests   Final    BOTTLES DRAWN AEROBIC AND ANAEROBIC Blood Culture adequate volume   Culture   Final    NO GROWTH 4 DAYS Performed at [facility], [address] [place], [place] [address]    Report Status PENDING  Incomplete  Culture, blood (Routine X 2) w Reflex to ID Panel     Status: None (Preliminary result)   Collection Time: 07/26/24  8:07 AM   Specimen: BLOOD LEFT ARM  Result Value Ref Range Status   Specimen Description BLOOD LEFT ARM  Final   Special Requests   Final    BOTTLES DRAWN AEROBIC AND ANAEROBIC Blood Culture results may not be optimal due to an inadequate volume of blood received in culture bottles   Culture   Final    NO GROWTH 2 DAYS Performed at [facility], [address] [place], [place] [address]     Report Status PENDING  Incomplete  Culture, blood (Routine X 2) w Reflex to ID Panel     Status: None (Preliminary result)   Collection Time: 07/26/24  8:08 AM   Specimen: BLOOD RIGHT HAND  Result Value Ref Range Status   Specimen Description BLOOD RIGHT HAND  Final   Special Requests   Final    BOTTLES DRAWN AEROBIC AND ANAEROBIC Blood Culture results may not be optimal due to an inadequate volume of blood received in culture bottles   Culture   Final    NO GROWTH 2 DAYS Performed at [facility], [address] [place], [place] [address]    Report Status PENDING  Incomplete         Radiology Studies: US  EKG SITE RITE Result Date: 07/28/2024 If Site Rite image not attached, placement could not be confirmed due to current cardiac rhythm.          LOS: 4 days   Time spent= 35 mins    Ena Demary C Terisha Losasso,  MD [facility]  If 7PM-7AM, please contact night-coverage  07/28/2024, 12:16 PM

## 2024-07-28 NOTE — Progress Notes (Signed)
 PHARMACY CONSULT NOTE FOR:  OUTPATIENT  PARENTERAL ANTIBIOTIC THERAPY (OPAT)  Indication: Possible line-related infection  Regimen: Daptomycin 700 mg IV every 24 hours + Ceftriaxone  2 gm IV every 24 hours  End date: 08/03/24   IV antibiotic discharge orders are pended. To discharging provider:  please sign these orders via discharge navigator,  Select New Orders & click on the button choice - Manage This Unsigned Work.     Thank you for allowing pharmacy to be a part of this patient's care.  Damien Quiet, PharmD, BCPS, BCIDP Infectious Diseases Clinical Pharmacist Phone: [phone] 07/28/2024, 10:34 AM

## 2024-07-28 NOTE — Progress Notes (Signed)
 [facility]  Date of Admission:  07/23/2024      Total days of antibiotics 4           ASSESSMENT: Douglas Price is a 73 y.o. male readmitted with:   Hypotension -  ?PICC line Infection -  Noted to have some redness at the insertion site and skin where old dressing was irritated and red. MRI unchanged from sacral wound component and no other explanation.  - Will plan 10 day course of treatment from PICC line removal with daptomycin and ceftriaxone  via midline.  Sacral Wound - MRI reads stable from August.  Treated. Likely will have recurrent wound infections going forward.   Vascular Access -  -SNF will accept and remove midline at end of therapy  -Home health/SNF orders to maintain midline line care and education for patient described below   Discharge Planning / Coordination of Care -  -Outpatient antibiotics set -Discussed with Holley Herring, ID pharmacy and primary   Medication Monitoring -  -Safety labs ordered and detailed below to be followed in OPAT clinic   PLAN: Midline OK to be placed  IV ABX plan as outlined below  FU with Dr. Dennise arranged  ID will sign off - please call back with any questions/concerns or if we can be of further assistance.     OPAT ORDERS:  Diagnosis: Concern over line infection   Culture Result: none  Allergies  Allergen Reactions   Avelox [Moxifloxacin] Other (See Comments)    Angioedema Fatigue   Quinolones Other (See Comments)    Angioedema   Lexapro [Escitalopram] Other (See Comments)    unknown     Discharge antibiotics to be given via PICC line:  Daptomycin  + Ceftriaxone  2 gm IV    Duration: 10 days total   End Date: 08/03/24   MIDLINE Care Per Protocol with Biopatch Use: Home health RN for IV administration and teaching, line care and labs.    Labs weekly while on IV antibiotics: _x_ CBC with differential __ BMP **TWICE WEEKLY ON VANCOMYCIN   __ CMP __  CRP __ ESR __ Vancomycin  trough TWICE WEEKLY _x_ CK  _x_ Please pull MIDLINE at completion of IV antibiotics __ Please leave PIC in place until doctor has seen patient or been notified  Fax weekly labs to [phone]  Clinic Follow Up Appt: 11/20 @ 10:00 am with Dr. Dennise    Principal Problem:   Hypotension Active Problems:   Parkinson's disease (HCC)   Depression   BPH (benign prostatic hyperplasia)   GERD (gastroesophageal reflux disease)   Acute urinary retention   Sacral wound   Diabetes mellitus type 2 in nonobese (HCC)   Anemia   Essential hypertension    ascorbic acid  500 mg Oral Daily   bisacodyl   10 mg Oral QHS   bisacodyl   10 mg Oral Once   buPROPion   300 mg Oral Daily   carbidopa -levodopa   1 tablet Oral TID WC   Chlorhexidine  Gluconate Cloth  6 each Topical Daily   clotrimazole   Topical BID   collagenase    Topical Daily   cyanocobalamin  1,000 mcg Intramuscular Q1200   Followed by   NOREEN ON 08/01/2024] vitamin B-12  1,000 mcg Oral Daily   DULoxetine   120 mg Oral QHS   dutasteride   0.5 mg Oral Daily   enoxaparin  (LOVENOX ) injection  40 mg Subcutaneous QPM   feeding supplement (GLUCERNA SHAKE)  237 mL Oral TID BM   insulin  aspart  0-9 Units Subcutaneous TID WC   iron polysaccharides  150 mg Oral Daily   lithium  carbonate  150 mg Oral Daily   liver oil-zinc  oxide   Topical BID   magnesium  oxide  400 mg Oral BID   mirtazapine   15 mg Oral QHS   nystatin   Topical BID   pantoprazole   40 mg Oral Daily   polyethylene glycol  17 g Oral BID   pregabalin   75 mg Oral BID   sodium chloride  flush  10-40 mL Intracatheter Q12H   tamsulosin   0.4 mg Oral QHS   Vitamin D (Ergocalciferol)  50,000 Units Oral Q7 days    SUBJECTIVE: Sister and brother at the bedside.  Time spent discussing wound care and supportive items needed to achieve this.    Review of Systems: Review of Systems  Constitutional:  Negative for chills and fever.  HENT:  Negative for  tinnitus.   Eyes:  Negative for blurred vision and photophobia.  Respiratory:  Negative for cough and sputum production.   Cardiovascular:  Negative for chest pain.  Gastrointestinal:  Negative for diarrhea, nausea and vomiting.  Genitourinary:  Negative for dysuria.  Musculoskeletal:        Hip pain/discomfort   Skin:  Negative for rash.  Neurological:  Negative for headaches.    Allergies  Allergen Reactions   Avelox [Moxifloxacin] Other (See Comments)    Angioedema Fatigue   Quinolones Other (See Comments)    Angioedema   Lexapro [Escitalopram] Other (See Comments)    unknown    OBJECTIVE: Vitals:   07/27/24 2010 07/27/24 2351 07/28/24 0645 07/28/24 0745  BP: 119/64 118/69 135/74 117/88  Pulse: (!) 101 92 71 100  Resp: 16 17 19 18   Temp: 99.5 F (37.5 C) 99.1 F (37.3 C) 98.1 F (36.7 C) 98.1 F (36.7 C)  TempSrc: Oral Oral Oral Oral  SpO2: 96% 95% 98% 98%  Weight:      Height:       Body mass index is 24.33 kg/m.  Physical Exam Cardiovascular:     Rate and Rhythm: Normal rate and regular rhythm.  Pulmonary:     Effort: Pulmonary effort is normal.     Comments: No shortness of breath detected in conversation.  Skin:    General: Skin is warm and dry.  Neurological:     Mental Status: He is oriented to person, place, and time.  Psychiatric:        Mood and Affect: Mood normal.        Behavior: Behavior normal.        Thought Content: Thought content normal.        Judgment: Judgment normal.     Lab Results Lab Results  Component Value Date   WBC 13.6 (H) 07/28/2024   HGB 10.6 (L) 07/28/2024   HCT 34.1 (L) 07/28/2024   MCV 86.1 07/28/2024   PLT 372 07/28/2024    Lab Results  Component Value Date   CREATININE 0.72 07/28/2024   BUN 15 07/28/2024   NA 136 07/28/2024   K 4.2 07/28/2024   CL 100 07/28/2024   CO2 25 07/28/2024    Lab Results  Component Value Date   ALT 8 07/25/2024   AST 11 (L) 07/25/2024   ALKPHOS 67 07/25/2024   BILITOT  0.5 07/25/2024     Microbiology: Recent Results (from the past 240 hours)  Culture, blood (single) w Reflex to ID Panel  Status: None (Preliminary result)   Collection Time: 07/23/24  4:32 AM   Specimen: Vein; Blood  Result Value Ref Range Status   MICRO NUMBER: [id number]  Preliminary   SPECIMEN QUALITY: Adequate  Preliminary   Source BLOOD 2  Preliminary   STATUS: PRELIMINARY  Preliminary   Result:   Preliminary    No growth to date. Culture is continuously monitored for a total of 120 hours incubation. A change in status will result in a phone report followed by an updated printed culture report.   COMMENT: Aerobic and anaerobic bottle received.  Preliminary  Culture, blood (single) w Reflex to ID Panel     Status: None (Preliminary result)   Collection Time: 07/23/24  4:23 PM   Specimen: Vein; Blood  Result Value Ref Range Status   MICRO NUMBER: [id number]  Preliminary   SPECIMEN QUALITY: Adequate  Preliminary   Source BLOOD 1  Preliminary   STATUS: PRELIMINARY  Preliminary   Result:   Preliminary    No growth to date. Culture is continuously monitored for a total of 120 hours incubation. A change in status will result in a phone report followed by an updated printed culture report.   COMMENT: Aerobic and anaerobic bottle received.  Preliminary  Urine Culture     Status: Abnormal   Collection Time: 07/24/24 12:18 AM   Specimen: Urine, Catheterized  Result Value Ref Range Status   Specimen Description URINE, CATHETERIZED  Final   Special Requests   Final    NONE Performed at [facility], [address] [place], [place] [address]    Culture 20,000 COLONIES/mL YEAST (A)  Final   Report Status 07/25/2024 FINAL  Final  Blood culture (routine x 2)     Status: None (Preliminary result)   Collection Time: 07/24/24  2:09 AM   Specimen: BLOOD LEFT ARM  Result Value Ref Range Status   Specimen Description BLOOD LEFT ARM  Final   Special Requests   Final    BOTTLES DRAWN  AEROBIC AND ANAEROBIC Blood Culture adequate volume   Culture   Final    NO GROWTH 4 DAYS Performed at [facility], [address] [place], [place] [address]    Report Status PENDING  Incomplete  Blood culture (routine x 2)     Status: None (Preliminary result)   Collection Time: 07/24/24  2:10 AM   Specimen: BLOOD LEFT ARM  Result Value Ref Range Status   Specimen Description BLOOD LEFT ARM  Final   Special Requests   Final    BOTTLES DRAWN AEROBIC AND ANAEROBIC Blood Culture adequate volume   Culture   Final    NO GROWTH 4 DAYS Performed at [facility], [address], [place], [place] [address]    Report Status PENDING  Incomplete  Culture, blood (Routine X 2) w Reflex to ID Panel     Status: None (Preliminary result)   Collection Time: 07/26/24  8:07 AM   Specimen: BLOOD LEFT ARM  Result Value Ref Range Status   Specimen Description BLOOD LEFT ARM  Final   Special Requests   Final    BOTTLES DRAWN AEROBIC AND ANAEROBIC Blood Culture results may not be optimal due to an inadequate volume of blood received in culture bottles   Culture   Final    NO GROWTH 2 DAYS Performed at [facility], [address] [place], [place] [address]    Report Status PENDING  Incomplete  Culture, blood (Routine X 2) w Reflex to ID Panel     Status: None (Preliminary result)   Collection Time: 07/26/24  8:08 AM   Specimen: BLOOD RIGHT HAND  Result Value Ref Range Status   Specimen Description BLOOD RIGHT HAND  Final   Special Requests   Final    BOTTLES DRAWN AEROBIC AND ANAEROBIC Blood Culture results may not be optimal due to an inadequate volume of blood received in culture bottles   Culture   Final    NO GROWTH 2 DAYS Performed at [facility], [address], [place], [place] [address]    Report Status PENDING  Incomplete    Corean Fireman, MSN, NP-C [facility]  [email or web address] Pager:  [phone] Office: [phone] [facility] Main Line: [phone] *Secure Chat Communication Welcome  Total Encounter Time: 30 m

## 2024-07-28 NOTE — Plan of Care (Signed)

## 2024-07-28 NOTE — [place] FL2 (Signed)
 [place]  MEDICAID FL2 LEVEL OF CARE FORM     IDENTIFICATION  Patient Name: Douglas Price Birthdate: [birth year] Sex: male Admission Date (Current Location): 07/23/2024  [facility] and [place] Number:  Producer, Television/film/video and Address:  [facility], [address], [place], [place] [address]      Provider Number: [id number]  Attending Physician Name and Address:  Caleen Burgess BROCKS, MD  Relative Name and Phone Number:  Trampus Mcquerry; Brother; [phone]    Current Level of Care: Hospital Recommended Level of Care: Skilled Nursing Facility Prior Approval Number:    Date Approved/Denied:   PASRR Number: [id number] F valid 05/29/24-08/27/24  Discharge Plan: SNF    Current Diagnoses: Patient Active Problem List   Diagnosis Date Noted   Hypotension 07/24/2024   Sacral wound 07/24/2024   Diabetes mellitus type 2 in nonobese (HCC) 07/24/2024   Anemia 07/24/2024   Essential hypertension 07/24/2024   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   Acute urinary retention 02/18/2024   Polypharmacy 02/15/2024   Hypokalemia 02/15/2024   Multiple wounds of skin 02/14/2024   Sepsis due to cellulitis (HCC) 02/14/2024   Transaminitis 02/14/2024   Fall at home, initial encounter 02/14/2024   Gait disturbance 02/14/2024   Depression 02/14/2024   Insomnia 02/14/2024   BPH (benign prostatic hyperplasia) 02/14/2024   GERD (gastroesophageal reflux disease) 02/14/2024   Pain due to onychomycosis of toenails of both feet 01/31/2023   Parkinson's disease (HCC) 11/25/2017   Diabetic polyneuropathy associated with type 2 diabetes mellitus (HCC) 11/25/2017   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   S/P lumbar spinal fusion 03/14/2016   S/P lumbar laminectomy 09/08/2015   Sleep disturbance 03/24/2014    Orientation RESPIRATION BLADDER Height & Weight     Self, Time, Situation, Place  Normal (Room Air) Indwelling catheter, Incontinent Weight:  194 lb 10.7 oz (88.3 kg) Height:  6' 3 (190.5 cm)  BEHAVIORAL SYMPTOMS/MOOD NEUROLOGICAL BOWEL NUTRITION STATUS      Incontinent Diet (Please see discharge summary)  AMBULATORY STATUS COMMUNICATION OF NEEDS Skin   Extensive Assist Verbally PU Stage and Appropriate Care (Pressure Injury Coccyx Bilateral Stage 4 - Full thickness tissue loss with exposed bone, tendon or muscle and Pressure Injury Buttocks Right Stage 3 - Full thickness tissue loss. Subcutaneous fat may be visible but bone, tendon or muscle are NOT exposed.)                       Personal Care Assistance Level of Assistance  Bathing, Dressing, Feeding Bathing Assistance: Maximum assistance Feeding assistance: Maximum assistance Dressing Assistance: Maximum assistance     Functional Limitations Info  Sight Sight Info: Impaired (R and L)        SPECIAL CARE FACTORS FREQUENCY  PT (By licensed PT), OT (By licensed OT)     PT Frequency: 5x OT Frequency: 5x            Contractures Contractures Info: Not present    Additional Factors Info  Code Status, Allergies, Insulin  Sliding Scale Code Status Info: DNR-LIMITED -Do Not Intubate/DNI Allergies Info: Avelox (moxifloxacin); Quinolones; Lexapro (escitalopram)   Insulin  Sliding Scale Info: Please see discharge summary       Current Medications (07/28/2024):  This is the current hospital active medication list Current Facility-Administered Medications  Medication Dose Route Frequency Provider Last Rate Last Admin   acetaminophen  (TYLENOL ) tablet 650 mg  650 mg Oral Q6H PRN Franky Redia SAILOR, MD   650  mg at 07/26/24 2019   Or   acetaminophen  (TYLENOL ) suppository 650 mg  650 mg Rectal Q6H PRN Kakrakandy, Arshad N, MD       ascorbic acid (VITAMIN C) tablet 500 mg  500 mg Oral Daily Von Bellis, MD   500 mg at 07/28/24 0839   bisacodyl  (DULCOLAX) EC tablet 10 mg  10 mg Oral QHS Von Bellis, MD   10 mg at 07/27/24 2105   bisacodyl  (DULCOLAX) EC tablet 10  mg  10 mg Oral Once Von Bellis, MD       bisacodyl  (DULCOLAX) suppository 10 mg  10 mg Rectal Daily PRN Von Bellis, MD       buPROPion  (WELLBUTRIN  XL) 24 hr tablet 300 mg  300 mg Oral Daily Kakrakandy, Arshad N, MD   300 mg at 07/28/24 9160   carbidopa -levodopa  (SINEMET  IR) 25-100 MG per tablet immediate release 1 tablet  1 tablet Oral TID WC Franky Redia SAILOR, MD   1 tablet at 07/28/24 0840   cefTRIAXone  (ROCEPHIN ) 2 g in sodium chloride  0.9 % 100 mL IVPB  2 g Intravenous Q24H Pham, Minh Q, RPH-CPP   Stopped at 07/27/24 1639   Chlorhexidine  Gluconate Cloth 2 % PADS 6 each  6 each Topical Daily Pahwani, Ravi, MD   6 each at 07/28/24 9057   clonazePAM  (KLONOPIN ) tablet 0.5 mg  0.5 mg Oral BID PRN Kakrakandy, Arshad N, MD   0.5 mg at 07/24/24 1346   clotrimazole (LOTRIMIN) 1 % cream   Topical BID Von Bellis, MD   Given at 07/27/24 2105   collagenase  (SANTYL ) ointment   Topical Daily Vernon Ranks, MD   Given at 07/28/24 0941   cyanocobalamin (VITAMIN B12) injection 1,000 mcg  1,000 mcg Intramuscular Q1200 Von Bellis, MD   1,000 mcg at 07/27/24 1204   Followed by   NOREEN ON 08/01/2024] cyanocobalamin (VITAMIN B12) tablet 1,000 mcg  1,000 mcg Oral Daily Von Bellis, MD       DAPTOmycin (CUBICIN) IVPB 700 mg/142mL premix  8 mg/kg Intravenous Q1400 Luiz Channel, MD   Stopped at 07/28/24 0027   diphenhydrAMINE  (BENADRYL ) capsule 25 mg  25 mg Oral Q6H PRN Howerter, Justin B, DO   25 mg at 07/28/24 9160   diphenhydrAMINE -zinc  acetate (BENADRYL ) 2-0.1 % cream   Topical TID PRN Von Bellis, MD   1 Application at 07/27/24 1604   DULoxetine  (CYMBALTA ) DR capsule 120 mg  120 mg Oral QHS Franky Redia SAILOR, MD   120 mg at 07/27/24 2104   dutasteride  (AVODART ) capsule 0.5 mg  0.5 mg Oral Daily Kakrakandy, Arshad N, MD   0.5 mg at 07/28/24 9160   enoxaparin  (LOVENOX ) injection 40 mg  40 mg Subcutaneous QPM Von Bellis, MD   40 mg at 07/27/24 2105   feeding supplement (GLUCERNA SHAKE)  (GLUCERNA SHAKE) liquid 237 mL  237 mL Oral TID BM Von Bellis, MD   237 mL at 07/28/24 0848   insulin  aspart (novoLOG ) injection 0-9 Units  0-9 Units Subcutaneous TID WC Franky Redia SAILOR, MD   1 Units at 07/28/24 0840   iron polysaccharides (NIFEREX) capsule 150 mg  150 mg Oral Daily Von Bellis, MD   150 mg at 07/28/24 0840   lithium  carbonate capsule 150 mg  150 mg Oral Daily Franky Redia SAILOR, MD   150 mg at 07/28/24 0840   liver oil-zinc  oxide (DESITIN) 40 % ointment   Topical BID Vernon Ranks, MD   Given at 07/28/24 0941   magnesium   oxide (MAG-OX) tablet 400 mg  400 mg Oral BID Von Bellis, MD   400 mg at 07/28/24 0840   mirtazapine  (REMERON ) tablet 15 mg  15 mg Oral QHS Kakrakandy, Arshad N, MD   15 mg at 07/27/24 2104   nystatin (MYCOSTATIN/NYSTOP) topical powder   Topical BID Von Bellis, MD   Given at 07/27/24 2105   oxyCODONE  (Oxy IR/ROXICODONE ) immediate release tablet 5 mg  5 mg Oral Q6H PRN Von Bellis, MD   5 mg at 07/28/24 0837   pantoprazole  (PROTONIX ) EC tablet 40 mg  40 mg Oral Daily Kakrakandy, Arshad N, MD   40 mg at 07/28/24 0840   polyethylene glycol (MIRALAX  / GLYCOLAX ) packet 17 g  17 g Oral BID Von Bellis, MD   17 g at 07/28/24 0846   pregabalin  (LYRICA ) capsule 75 mg  75 mg Oral BID Franky Redia SAILOR, MD   75 mg at 07/28/24 0840   sodium chloride  flush (NS) 0.9 % injection 10-40 mL  10-40 mL Intracatheter Q12H Pahwani, Fredia, MD   10 mL at 07/27/24 2106   sodium chloride  flush (NS) 0.9 % injection 10-40 mL  10-40 mL Intracatheter PRN Vernon Fredia, MD       tamsulosin  (FLOMAX ) capsule 0.4 mg  0.4 mg Oral QHS Franky Redia SAILOR, MD   0.4 mg at 07/27/24 2106   Vitamin D (Ergocalciferol) (DRISDOL) 1.25 MG (50000 UNIT) capsule 50,000 Units  50,000 Units Oral Q7 days Von Bellis, MD   50,000 Units at 07/26/24 9160     Discharge Medications: Please see discharge summary for a list of discharge medications.  Relevant Imaging Results:  Relevant  Lab Results:   Additional Information SSN [id number]  Lauraine FORBES Saa, LCSWA

## 2024-07-28 NOTE — Progress Notes (Signed)
 Peripherally Inserted Central Catheter Placement  The IV Nurse has discussed with the patient and/or persons authorized to consent for the patient, the purpose of this procedure and the potential benefits and risks involved with this procedure.  The benefits include less needle sticks, lab draws from the catheter, and the patient may be discharged home with the catheter. Risks include, but not limited to, infection, bleeding, blood clot (thrombus formation), and puncture of an artery; nerve damage and irregular heartbeat and possibility to perform a PICC exchange if needed/ordered by physician.  Alternatives to this procedure were also discussed.  Bard Power PICC patient education guide, fact sheet on infection prevention and patient information card has been provided to patient /or left at bedside.    PICC Placement Documentation  PICC Single Lumen 07/28/24 Left Brachial 43 cm 0 cm (Active)  Indication for Insertion or Continuance of Line Home intravenous therapies (PICC only) 07/28/24 1600  Exposed Catheter (cm) 0 cm 07/28/24 1600  Site Assessment Clean, Dry, Intact 07/28/24 1600  Line Status Flushed;Saline locked;Blood return noted 07/28/24 1600  Dressing Type Transparent;Securing device 07/28/24 1600  Dressing Status Antimicrobial disc/dressing in place;Clean, Dry, Intact 07/28/24 1600  Line Care Connections checked and tightened 07/28/24 1600  Line Adjustment (NICU/IV Team Only) No 07/28/24 1600  Dressing Intervention New dressing;Adhesive placed at insertion site (IV team only) 07/28/24 1600  Dressing Change Due 08/04/24 07/28/24 1600       Douglas Price 07/28/2024, 4:45 PM

## 2024-07-28 NOTE — TOC Progression Note (Signed)
 Transition of Care [facility]) - Progression Note    Patient Details  Name: Douglas Price MRN: [id number] Date of Birth: June 20, 1951  Transition of Care [facility]) CM/SW Contact  Lauraine FORBES Saa, LCSWA Phone Number: 07/28/2024, 12:30 PM  Clinical Narrative:     12:30 PM CSW sent patient's FL2 to [facility] SNF LTC. MD and ID informed CSW and bedside RN that patient anticipated to discharge tomorrow with PICC for logner term IV ABX. CSW made SNF aware. CSW will continue to follow.  Expected Discharge Plan: Long Term Nursing Home Barriers to Discharge: Continued Medical Work up               Expected Discharge Plan and Services In-house Referral: Clinical Social Work   Post Acute Care Choice: Skilled Nursing Facility Living arrangements for the past 2 months: Skilled Nursing Facility                                       Social Drivers of Health (SDOH) Interventions SDOH Screenings   Food Insecurity: No Food Insecurity (07/25/2024)  Housing: Low Risk  (07/25/2024)  Transportation Needs: No Transportation Needs (07/25/2024)  Utilities: Not At Risk (07/25/2024)  Depression (PHQ2-9): Medium Risk (02/11/2023)  Financial Resource Strain: Low Risk  (02/07/2023)  Social Connections: Moderately Isolated (07/25/2024)  Tobacco Use: High Risk (07/24/2024)    Readmission Risk Interventions     No data to display

## 2024-07-29 DIAGNOSIS — I9589 Other hypotension: Secondary | ICD-10-CM | POA: Diagnosis not present

## 2024-07-29 LAB — CULTURE, BLOOD (ROUTINE X 2)
Culture: NO GROWTH
Culture: NO GROWTH
Special Requests: ADEQUATE
Special Requests: ADEQUATE

## 2024-07-29 LAB — BASIC METABOLIC PANEL WITH GFR
Anion gap: 13 (ref 5–15)
BUN: 16 mg/dL (ref 8–23)
CO2: 27 mmol/L (ref 22–32)
Calcium: 8.5 mg/dL — ABNORMAL LOW (ref 8.9–10.3)
Chloride: 99 mmol/L (ref 98–111)
Creatinine, Ser: 0.76 mg/dL (ref 0.61–1.24)
GFR, Estimated: >60 mL/min (ref >60)
Glucose, Bld: 93 mg/dL (ref 70–99)
Potassium: 3.9 mmol/L (ref 3.5–5.1)
Sodium: 139 mmol/L (ref 135–145)

## 2024-07-29 LAB — CBC
HCT: 34.8 % — ABNORMAL LOW (ref 39.0–52.0)
Hemoglobin: 10.6 g/dL — ABNORMAL LOW (ref 13.0–17.0)
MCH: 26.5 pg (ref 26.0–34.0)
MCHC: 30.5 g/dL (ref 30.0–36.0)
MCV: 87 fL (ref 80.0–100.0)
Platelets: 388 K/uL (ref 150–400)
RBC: 4 MIL/uL — ABNORMAL LOW (ref 4.22–5.81)
RDW: 15.4 % (ref 11.5–15.5)
WBC: 13.7 K/uL — ABNORMAL HIGH (ref 4.0–10.5)
nRBC: 0 % (ref 0.0–0.2)

## 2024-07-29 LAB — GLUCOSE, CAPILLARY: Glucose-Capillary: 88 mg/dL (ref 70–99)

## 2024-07-29 MED ORDER — CLONAZEPAM 0.5 MG PO TABS: 0.5000 mg | ORAL_TABLET | Freq: Two times a day (BID) | ORAL | 0 refills | Status: AC | PRN

## 2024-07-29 MED ORDER — INSULIN ASPART 100 UNIT/ML IJ SOLN: 0.0000 [IU] | Freq: Three times a day (TID) | INTRAMUSCULAR | Status: AC

## 2024-07-29 MED ORDER — OXYCODONE HCL 5 MG PO TABS: 5.0000 mg | ORAL_TABLET | ORAL | 0 refills | Status: AC | PRN

## 2024-07-29 MED ORDER — POLYSACCHARIDE IRON COMPLEX 150 MG PO CAPS: 150.0000 mg | ORAL_CAPSULE | Freq: Every day | ORAL | Status: AC

## 2024-07-29 MED ORDER — NYSTATIN 100000 UNIT/GM EX POWD: Freq: Two times a day (BID) | CUTANEOUS | Status: AC

## 2024-07-29 MED ORDER — ZINC OXIDE 40 % EX OINT: TOPICAL_OINTMENT | Freq: Two times a day (BID) | CUTANEOUS | Status: AC

## 2024-07-29 MED ORDER — ASCORBIC ACID 500 MG PO TABS: 500.0000 mg | ORAL_TABLET | Freq: Every day | ORAL | Status: AC

## 2024-07-29 MED ORDER — CYANOCOBALAMIN 1000 MCG PO TABS: 1000.0000 ug | ORAL_TABLET | Freq: Every day | ORAL | Status: AC

## 2024-07-29 MED ORDER — BISACODYL 5 MG PO TBEC: 10.0000 mg | DELAYED_RELEASE_TABLET | Freq: Every day | ORAL | Status: AC

## 2024-07-29 MED ORDER — COLLAGENASE 250 UNIT/GM EX OINT: TOPICAL_OINTMENT | Freq: Every day | CUTANEOUS | Status: AC

## 2024-07-29 MED ORDER — VITAMIN D (ERGOCALCIFEROL) 1.25 MG (50000 UNIT) PO CAPS: 50000.0000 [IU] | ORAL_CAPSULE | ORAL | Status: AC

## 2024-07-29 MED ORDER — CLOTRIMAZOLE 1 % EX CREA: TOPICAL_CREAM | Freq: Two times a day (BID) | CUTANEOUS | Status: AC

## 2024-07-29 MED ORDER — DIPHENHYDRAMINE-ZINC ACETATE 2-0.1 % EX CREA: TOPICAL_CREAM | Freq: Three times a day (TID) | CUTANEOUS | Status: AC | PRN

## 2024-07-29 NOTE — TOC Transition Note (Signed)
 Transition of Care [facility]) - Discharge Note   Patient Details  Name: Douglas Price MRN: [id number] Date of Birth: 1951-06-19  [facility]) CM/SW Contact:  Douglas Price Saa, LCSWA Phone Number: 07/29/2024, 10:15 AM   Clinical Narrative:     [facility] Anticipated DC date: 07/29/2024 Family notified: Octaviano Cave; Brother; [phone] Transport by: [facility]   Per MD patient ready for DC to [facility] SN[facility] LTC. RN to call report prior to discharge [phone]). RN, patient, patient's family, and facility notified of DC. Discharge Summary and FL2 sent to facility. DC packet on chart. Ambulance transport requested for patient at 10:12.  CSW will sign off for now as social work intervention is no longer needed. Please consult us  again if new needs arise.  Final next level of care: Skilled Nursing Facility Barriers to Discharge: Barriers Resolved   Patient Goals and CMS Choice Patient states their goals for this hospitalization and ongoing recovery are:: To return to ltc snf   Choice offered to / list presented to : NA      Discharge Placement              Patient chooses bed at: [facility] Patient to be transferred to facility by: [facility] Name of family member notified: Octaviano Cave; Brother; [phone] Patient and family notified of of transfer: 07/29/24  Discharge Plan and Services Additional resources added to the After Visit Summary for   In-house Referral: Clinical Social Work   Post Acute Care Choice: Skilled Nursing Facility                               Social Drivers of Health (SDOH) Interventions SDOH Screenings   Food Insecurity: No Food Insecurity (07/25/2024)  Housing: Low Risk  (07/25/2024)  Transportation Needs: No Transportation Needs (07/25/2024)  Utilities: Not At Risk (07/25/2024)  Depression (PHQ2-9): Medium Risk (02/11/2023)  Financial Resource Strain: Low Risk  (02/07/2023)  Social  Connections: Moderately Isolated (07/25/2024)  Tobacco Use: High Risk (07/24/2024)     Readmission Risk Interventions     No data to display

## 2024-07-29 NOTE — Discharge Summary (Signed)
 Physician Discharge Summary  DOROTEO NICKOLSON FMW:[id number] DOB: 1951-07-03 DOA: 07/23/2024  PCP: [name], [name], MD  Admit date: 07/23/2024 Discharge date: 07/29/2024  Admitted From: SNF Disposition:  SNF  Recommendations for Outpatient Follow-up:  Follow up with PCP in 1-2 weeks Please obtain BMP/CBC in one week your next doctors visit.  Remove PICC after Abx course has been completed Wound care and other recs as mentioned below.   Discharge Condition: Stable CODE STATUS: DNR Diet recommendation: Diabetic  Brief/Interim Summary: Brief Narrative:  73 y.o. male with history of Parkinson's disease, diabetes mellitus type 2, hypertension, depression, chronic indwelling Foley catheter was referred to ER from infectious disease office after patient was found to be hypotensive and diaphoretic.  Patient was recently admitted to the hospital in August 2025 for infected decubitus ulcer with Proteus mirabilis bacteremia was on oral antibiotics.  During the stay at skilled nursing facility patient was placed on meropenem and Zyvox and was following up with infectious disease when patient became hypotensive at office.  Was referred to the ER.  Foley was replaced in the ER.  Repeat MRI largely unchanged, ongoing local wound care.  With recommendations from ID, PICC line removed on 11/2, repeat cultures h have remained negative.  PICC line was eventually placed on 11/4 with plans for daptomycin and Rocephin  until 08/03/2024.   Other recs: Patient would benefit low-air-loss mattress for pressure distribution and moisture management Okay to use p.o. and topical Benadryl  for itching.  Avoid over drowsiness Needs to continue working with PT/OT daily as tolerated.  Ambulate is much as possible Pain medication/oxycodone /hydrocodone  to be administered at least 30 minutes prior to his wound care/dressing changes Needs to have daily bowel movement.  Adjust bowel regimen as necessary Continue wound care  team to follow: Clean with Vashe and daily Santyl .    Assessment & Plan:  Septic shock/hypotension secondary to PICC line infection - Concerns of possible underlying PICC line infection/worsening sacral wound.  MRI pelvis shows large decubitus ulcer with possible early osteomyelitis.  Largely unchanged from August.  Seen by ID, recommending line holiday -Repeat blood cultures 11/2-negative - Plan for daptomycin and Rocephin  until 08/03/2024 - Left upper extremity PICC line placed 11/4    History of hypertension  takes Nebivolol  and telmisartan  which we will hold for now until blood pressure is stable.   Diabetes mellitus type 2  Continue metformin  and sliding scale   Parkinsonism  on carbidopa .  Follows with Dr. Bryn neurologist.   History of depression  on lithium , Cymbalta , Wellbutrin  Remeron .  Takes Klonopin  for anxiety.    BPH  on dutasteride  and tamsulosin .   Chronic indwelling Foley catheter  with abnormal UA.  Foley was replaced during this hospitalization  Insomnia - Continue home medications     GERD  on PPI.   Vitamin D deficiency started vitamin D 50,000 units p.o. weekly, follow with PCP    Vitamin B12 level 217, goal >400 B12 supplements   Anemia due to iron deficiency and low B12 level.  Monitor CBC.  Follow-up with PCP to repeat iron profile after 3 to 6 months.   Right axillary fungal infection started clotrimazole and nystatin powder. Benadryl  as needed for itching     Body mass index is 24.66 kg/m.    DVT prophylaxis: enoxaparin  (LOVENOX ) injection 40 mg Start: 07/25/24 1800    Code Status: Limited: Do not attempt resuscitation (DNR) -DNR-LIMITED -Do Not Intubate/DNI  Family Communication:   Status is: Inpatient Remains inpatient appropriate because: Discharge  today  PT Follow up Recs: Other (Comment)07/25/2024 1435  Subjective: Feel well no complaints.  Examination:  General exam: Appears calm and comfortable  Respiratory system:  Clear to auscultation. Respiratory effort normal. Cardiovascular system: S1 & S2 heard, RRR. No JVD, murmurs, rubs, gallops or clicks. No pedal edema. Gastrointestinal system: Abdomen is nondistended, soft and nontender. No organomegaly or masses felt. Normal bowel sounds heard. Central nervous system: Alert and oriented. No focal neurological deficits. Extremities: Symmetric 5 x 5 power. Skin: No rashes, lesions or ulcers Psychiatry: Judgement and insight appear normal. Mood & affect appropriate.    Discharge Diagnoses:  Principal Problem:   Hypotension Active Problems:   Acute urinary retention   Parkinson's disease (HCC)   Depression   BPH (benign prostatic hyperplasia)   GERD (gastroesophageal reflux disease)   Sacral wound   Diabetes mellitus type 2 in nonobese (HCC)   Anemia   Essential hypertension      Discharge Exam: Vitals:   07/29/24 0617 07/29/24 0831  BP: (!) 143/73 138/86  Pulse: 81 80  Resp: 18 15  Temp: 98.1 F (36.7 C) 98.2 F (36.8 C)  SpO2: 95% 98%   Vitals:   07/28/24 1732 07/28/24 2008 07/29/24 0617 07/29/24 0831  BP: 130/74 (!) 140/87 (!) 143/73 138/86  Pulse: 94 98 81 80  Resp: 18 17 18 15   Temp: 98.8 F (37.1 C) 98.1 F (36.7 C) 98.1 F (36.7 C) 98.2 F (36.8 C)  TempSrc: Oral Oral Oral Oral  SpO2: 96% 95% 95% 98%  Weight:      Height:          Discharge Instructions  Discharge Instructions     Home infusion instructions   Complete by: As directed    Instructions: Flushing of vascular access device: 0.9% NaCl pre/post medication administration and prn patency; Heparin  100 u/ml, 5ml for implanted ports and Heparin  10u/ml, 5ml for all other central venous catheters.      Allergies as of 07/29/2024       Reactions   Avelox [moxifloxacin] Other (See Comments)   Angioedema Fatigue   Quinolones Other (See Comments)   Angioedema   Lexapro [escitalopram] Other (See Comments)   unknown        Medication List     STOP  taking these medications    meropenem IVPB Commonly known as: MERREM   oxyCODONE -acetaminophen  5-325 MG tablet Commonly known as: PERCOCET/ROXICET       TAKE these medications    acetaminophen  325 MG tablet Commonly known as: TYLENOL  Take 650 mg by mouth every 6 (six) hours as needed.   ascorbic acid 500 MG tablet Commonly known as: VITAMIN C Take 1 tablet (500 mg total) by mouth daily.   bisacodyl  10 MG suppository Commonly known as: DULCOLAX Place 10 mg rectally as needed for moderate constipation. Insert one suppository rectally every 24 hours as needed for constipation. What changed: Another medication with the same name was added. Make sure you understand how and when to take each.   bisacodyl  5 MG EC tablet Commonly known as: DULCOLAX Take 2 tablets (10 mg total) by mouth at bedtime. What changed: You were already taking a medication with the same name, and this prescription was added. Make sure you understand how and when to take each.   buPROPion  300 MG 24 hr tablet Commonly known as: WELLBUTRIN  XL TAKE 1 TABLET(300 MG) BY MOUTH DAILY   carbidopa -levodopa  25-100 MG tablet Commonly known as: SINEMET  IR Take 1 tablet by mouth  3 (three) times daily. What changed: additional instructions   cefTRIAXone  IVPB Commonly known as: ROCEPHIN  Inject 2 g into the vein daily for 6 days. Indication:  Possible line-related infection  First Dose: Yes Last Day of Therapy: 08/03/24 Labs - Once weekly:  CBC/D and BMP, Labs - Once weekly: ESR and CRP   clonazePAM  0.5 MG tablet Commonly known as: KlonoPIN  Take 1 tablet (0.5 mg total) by mouth 2 (two) times daily as needed for up to 7 days for anxiety. What changed:  when to take this additional instructions   clotrimazole 1 % cream Commonly known as: LOTRIMIN Apply topically 2 (two) times daily for 15 days.   collagenase  250 UNIT/GM ointment Commonly known as: SANTYL  Apply topically daily.   cyanocobalamin 1000 MCG  tablet Take 1 tablet (1,000 mcg total) by mouth daily. Start taking on: August 01, 2024   daptomycin IVPB Commonly known as: CUBICIN Inject 700 mg into the vein daily for 6 days. Indication:  Possible line-related infection  First Dose: Yes Last Day of Therapy:  08/03/24 Labs - Once weekly:  CBC/D, BMP, and CPK Labs - Once weekly: ESR and CRP   Decubi-Vite Caps Take 1 capsule by mouth in the morning.   diphenhydrAMINE  25 MG tablet Commonly known as: BENADRYL  Take 25 mg by mouth daily as needed for itching.   diphenhydrAMINE -zinc  acetate cream Commonly known as: BENADRYL  Apply topically 3 (three) times daily as needed for itching.   DULoxetine  60 MG capsule Commonly known as: CYMBALTA  Take 120 mg by mouth at bedtime.   dutasteride  0.5 MG capsule Commonly known as: AVODART  Take 0.5 mg by mouth daily.   heparin  lock flush 100 UNIT/ML Soln injection Inject 300 Units into the vein every 8 (eight) hours. Use 3ml intravenously every 8 hours to maintain patency.   insulin  aspart 100 UNIT/ML injection Commonly known as: novoLOG  Inject 0-9 Units into the skin 3 (three) times daily with meals.   iron polysaccharides 150 MG capsule Commonly known as: NIFEREX Take 1 capsule (150 mg total) by mouth daily.   lithium  carbonate 150 MG capsule TAKE 1 CAPSULE(150 MG) BY MOUTH EVERY EVENING What changed: See the new instructions.   liver oil-zinc  oxide 40 % ointment Commonly known as: DESITIN Apply topically 2 (two) times daily.   loratadine 10 MG tablet Commonly known as: CLARITIN Take 10 mg by mouth daily as needed for allergies, rhinitis or itching.   magnesium  hydroxide 400 MG/5ML suspension Commonly known as: MILK OF MAGNESIA Take 30 mLs by mouth daily as needed for mild constipation.   metFORMIN  500 MG tablet Commonly known as: GLUCOPHAGE  Take 1,000 mg by mouth 2 (two) times daily with a meal.   mirtazapine  15 MG tablet Commonly known as: REMERON  Take 15 mg by mouth  at bedtime.   nebivolol  2.5 MG tablet Commonly known as: BYSTOLIC  Take 2.5 mg by mouth daily.   nutrition supplement (JUVEN) Pack Take 1 packet by mouth 2 (two) times daily between meals. What changed: additional instructions   nystatin powder Commonly known as: MYCOSTATIN/NYSTOP Apply topically 2 (two) times daily.   omeprazole 20 MG capsule Commonly known as: PRILOSEC Take 20 mg by mouth 2 (two) times daily before a meal.   oxyCODONE  5 MG immediate release tablet Commonly known as: Oxy IR/ROXICODONE  Take 1 tablet (5 mg total) by mouth every 4 (four) hours as needed for moderate pain (pain score 4-6) or severe pain (pain score 7-10).   polyethylene glycol 17 g packet Commonly known  as: MIRALAX  / GLYCOLAX  Take 17 g by mouth daily.   pregabalin  75 MG capsule Commonly known as: LYRICA  Take 1 capsule (75 mg total) by mouth 2 (two) times daily.   PROSTAT PO Take 30 mLs by mouth 2 (two) times daily between meals. Give 30ml by mouth twice a day at 1000 and 1800 for nutrition support and wound healing.   sodium chloride  flush 0.9 % Soln Commonly known as: NS Inject 10 mLs into the vein every 8 (eight) hours. Use 10ml intravenously every 8 hours to maintain line patency. Inject at 0000, 0800, and 1600.   SODIUM PHOSPHATES RE Place 1 Bottle rectally as needed (constipation). Insert one sodium phosphate  enema rectally every 24 hours as needed if no relief from suppository.   tamsulosin  0.4 MG Caps capsule Commonly known as: FLOMAX  Take 0.4 mg by mouth at bedtime.   telmisartan  40 MG tablet Commonly known as: MICARDIS  Take 40 mg by mouth daily.   Vitamin D (Ergocalciferol) 1.25 MG (50000 UNIT) Caps capsule Commonly known as: DRISDOL Take 1 capsule (50,000 Units total) by mouth every 7 (seven) days. Start taking on: August 02, 2024               Home Infusion Instuctions  (From admission, onward)           Start     Ordered   07/28/24 0000  Home infusion  instructions       Question:  Instructions  Answer:  Flushing of vascular access device: 0.9% NaCl pre/post medication administration and prn patency; Heparin  100 u/ml, 5ml for implanted ports and Heparin  10u/ml, 5ml for all other central venous catheters.   07/28/24 1037            Follow-up Information     [name], [name], MD Follow up in 1 week(s).   Specialty: Internal Medicine Contact information: [address] [place] [address] [phone]                Allergies  Allergen Reactions   Avelox [Moxifloxacin] Other (See Comments)    Angioedema Fatigue   Quinolones Other (See Comments)    Angioedema   Lexapro [Escitalopram] Other (See Comments)    unknown    You were cared for by a hospitalist during your hospital stay. If you have any questions about your discharge medications or the care you received while you were in the hospital after you are discharged, you can call the unit and asked to speak with the hospitalist on call if the hospitalist that took care of you is not available. Once you are discharged, your primary care physician will handle any further medical issues. Please note that no refills for any discharge medications will be authorized once you are discharged, as it is imperative that you return to your primary care physician (or establish a relationship with a primary care physician if you do not have one) for your aftercare needs so that they can reassess your need for medications and monitor your lab values.  You were cared for by a hospitalist during your hospital stay. If you have any questions about your discharge medications or the care you received while you were in the hospital after you are discharged, you can call the unit and asked to speak with the hospitalist on call if the hospitalist that took care of you is not available. Once you are discharged, your primary care physician will handle any further medical issues. Please note that NO  REFILLS for  any discharge medications will be authorized once you are discharged, as it is imperative that you return to your primary care physician (or establish a relationship with a primary care physician if you do not have one) for your aftercare needs so that they can reassess your need for medications and monitor your lab values.  Please request your Prim.MD to go over all Hospital Tests and Procedure/Radiological results at the follow up, please get all Hospital records sent to your Prim MD by signing hospital release before you go home.  Get CBC, CMP, 2 view Chest X ray checked  by Primary MD during your next visit or SNF MD in 5-7 days ( we routinely change or add medications that can affect your baseline labs and fluid status, therefore we recommend that you get the mentioned basic workup next visit with your PCP, your PCP may decide not to get them or add new tests based on their clinical decision)  On your next visit with your primary care physician please Get Medicines reviewed and adjusted.  If you experience worsening of your admission symptoms, develop shortness of breath, life threatening emergency, suicidal or homicidal thoughts you must seek medical attention immediately by calling 911 or calling your MD immediately  if symptoms less severe.  You Must read complete instructions/literature along with all the possible adverse reactions/side effects for all the Medicines you take and that have been prescribed to you. Take any new Medicines after you have completely understood and accpet all the possible adverse reactions/side effects.   Do not drive, operate heavy machinery, perform activities at heights, swimming or participation in water  activities or provide baby sitting services if your were admitted for syncope or siezures until you have seen by Primary MD or a Neurologist and advised to do so again.  Do not drive when taking Pain medications.   Procedures/Studies: US  EKG SITE  RITE Result Date: 07/28/2024 If Site Rite image not attached, placement could not be confirmed due to current cardiac rhythm.  MR PELVIS WO CONTRAST Result Date: 07/24/2024 EXAM: MRI OF THE PELVIS WITHOUT INTRAVENOUS CONTRAST 07/24/2024 01:41:44 AM TECHNIQUE: Multiplanar magnetic resonance images of the pelvis without intravenous contrast. COMPARISON: None available. CLINICAL HISTORY: Sacrum/coccygeal osteomyelitis. FINDINGS: LIMITATIONS/ARTIFACTS: Motion artifact is present, reducing diagnostic sensitivity and specificity. SOFT TISSUES: Large decubitus ulcer along the right posterior sacrococcygeal margin extending to the periosteum. Low level edema in the lower posterior paraspinal musculature without drainable abscess. JOINTS: Anterior spurring of the sacroiliac joints without sacroiliac joint effusion. LIMITED INTRAPELVIC CONTENTS: Foley catheter noted in the urinary bladder. BONES: Rod and pedicle screw fixation at L2, L3, L4, and L5. 10 mm fused anterolisthesis at L4-L5. Loss of disc height at L5-S1. Low level edema in the S4 and S5 segments of the sacrum and coccyx compatible with early osteomyelitis. No overt bony destructive findings. STOMACH AND BOWEL: Moderately prominent rectal stool ball, cannot exclude fecal impaction. IMPRESSION: 1. Large decubitus ulcer along the right posterior sacrococcygeal margin with low-level marrow edema in S4S5 and coccyx compatible with early osteomyelitis; no overt bony destruction. 2. Low-level edema in the lower posterior paraspinal musculature without drainable abscess. 3. Mild presacral edema. 4. Motion artifact reduces diagnostic sensitivity and specificity. Electronically signed by: Ryan Salvage MD 07/24/2024 08:43 AM EDT RP Workstation: [id number]   CT HEAD WO CONTRAST ( ) Result Date: 06/30/2024 EXAM: CT HEAD, FACIAL BONES AND CERVICAL SPINE WITHOUT CONTRAST 06/30/2024 03:25:55 AM TECHNIQUE: CT of the head, facial bones and cervical spine was  performed without  the administration of intravenous contrast. Multiplanar reformatted images are provided for review. Automated exposure control, iterative reconstruction, and/or weight based adjustment of the mA/kV was utilized to reduce the radiation dose to as low as reasonably achievable. COMPARISON: None available. CLINICAL HISTORY: Head trauma, moderate-severe, following a fall from bed approximately 1.5 hours prior to presentation. The patient reports head, neck, and mouth pain, with 3 teeth knocked out. No loss of consciousness or use of blood thinners. Dry blood is noted in the patient's mouth, and an abrasion to the left eyebrow is present. FINDINGS: CT HEAD BRAIN AND VENTRICLES: Generalized volume loss. Chronic ischemic white matter changes. No acute intracranial hemorrhage, mass effect, midline shift, extra-axial fluid collection, acute infarct, or hydrocephalus. SKULL AND SCALP: Left periorbital scalp hematoma. No acute skull fracture. CT FACIAL BONES FACIAL BONES: No acute facial fracture, mandibular dislocation, or suspicious bone lesion. ORBITS: No acute traumatic injury. SINUSES AND MASTOIDS: No acute abnormality. SOFT TISSUES: No acute abnormality. CT CERVICAL SPINE BONES AND ALIGNMENT: Minimally displaced fracture of the left transverse process of C6. The fracture does not cross the transverse foramen. No traumatic malalignment. DEGENERATIVE CHANGES: No significant degenerative changes. SOFT TISSUES: No prevertebral soft tissue swelling. IMPRESSION: 1. No acute intracranial abnormality. 2. Minimally displaced fracture of the left C6 transverse process, not involving the transverse foramen. 3. Left periorbital scalp hematoma. No facial fracture. Electronically signed by: Franky Stanford MD 06/30/2024 03:34 AM EDT RP Workstation: [id number]   CT CERVICAL SPINE WO CONTRAST Result Date: 06/30/2024 EXAM: CT HEAD, FACIAL BONES AND CERVICAL SPINE WITHOUT CONTRAST 06/30/2024 03:25:55 AM TECHNIQUE: CT of  the head, facial bones and cervical spine was performed without the administration of intravenous contrast. Multiplanar reformatted images are provided for review. Automated exposure control, iterative reconstruction, and/or weight based adjustment of the mA/kV was utilized to reduce the radiation dose to as low as reasonably achievable. COMPARISON: None available. CLINICAL HISTORY: Head trauma, moderate-severe, following a fall from bed approximately 1.5 hours prior to presentation. The patient reports head, neck, and mouth pain, with 3 teeth knocked out. No loss of consciousness or use of blood thinners. Dry blood is noted in the patient's mouth, and an abrasion to the left eyebrow is present. FINDINGS: CT HEAD BRAIN AND VENTRICLES: Generalized volume loss. Chronic ischemic white matter changes. No acute intracranial hemorrhage, mass effect, midline shift, extra-axial fluid collection, acute infarct, or hydrocephalus. SKULL AND SCALP: Left periorbital scalp hematoma. No acute skull fracture. CT FACIAL BONES FACIAL BONES: No acute facial fracture, mandibular dislocation, or suspicious bone lesion. ORBITS: No acute traumatic injury. SINUSES AND MASTOIDS: No acute abnormality. SOFT TISSUES: No acute abnormality. CT CERVICAL SPINE BONES AND ALIGNMENT: Minimally displaced fracture of the left transverse process of C6. The fracture does not cross the transverse foramen. No traumatic malalignment. DEGENERATIVE CHANGES: No significant degenerative changes. SOFT TISSUES: No prevertebral soft tissue swelling. IMPRESSION: 1. No acute intracranial abnormality. 2. Minimally displaced fracture of the left C6 transverse process, not involving the transverse foramen. 3. Left periorbital scalp hematoma. No facial fracture. Electronically signed by: Franky Stanford MD 06/30/2024 03:34 AM EDT RP Workstation: [id number]   CT MAXILLOFACIAL WO CONTRAST Result Date: 06/30/2024 EXAM: CT HEAD, FACIAL BONES AND CERVICAL SPINE WITHOUT  CONTRAST 06/30/2024 03:25:55 AM TECHNIQUE: CT of the head, facial bones and cervical spine was performed without the administration of intravenous contrast. Multiplanar reformatted images are provided for review. Automated exposure control, iterative reconstruction, and/or weight based adjustment of the mA/kV was utilized to reduce the radiation dose  to as low as reasonably achievable. COMPARISON: None available. CLINICAL HISTORY: Head trauma, moderate-severe, following a fall from bed approximately 1.5 hours prior to presentation. The patient reports head, neck, and mouth pain, with 3 teeth knocked out. No loss of consciousness or use of blood thinners. Dry blood is noted in the patient's mouth, and an abrasion to the left eyebrow is present. FINDINGS: CT HEAD BRAIN AND VENTRICLES: Generalized volume loss. Chronic ischemic white matter changes. No acute intracranial hemorrhage, mass effect, midline shift, extra-axial fluid collection, acute infarct, or hydrocephalus. SKULL AND SCALP: Left periorbital scalp hematoma. No acute skull fracture. CT FACIAL BONES FACIAL BONES: No acute facial fracture, mandibular dislocation, or suspicious bone lesion. ORBITS: No acute traumatic injury. SINUSES AND MASTOIDS: No acute abnormality. SOFT TISSUES: No acute abnormality. CT CERVICAL SPINE BONES AND ALIGNMENT: Minimally displaced fracture of the left transverse process of C6. The fracture does not cross the transverse foramen. No traumatic malalignment. DEGENERATIVE CHANGES: No significant degenerative changes. SOFT TISSUES: No prevertebral soft tissue swelling. IMPRESSION: 1. No acute intracranial abnormality. 2. Minimally displaced fracture of the left C6 transverse process, not involving the transverse foramen. 3. Left periorbital scalp hematoma. No facial fracture. Electronically signed by: Franky Stanford MD 06/30/2024 03:34 AM EDT RP Workstation: [id number]     The results of significant diagnostics from this  hospitalization (including imaging, microbiology, ancillary and laboratory) are listed below for reference.     Microbiology: Recent Results (from the past 240 hours)  Culture, blood (single) w Reflex to ID Panel     Status: None   Collection Time: 07/23/24  4:32 AM   Specimen: Vein; Blood  Result Value Ref Range Status   MICRO NUMBER: [id number]  Final   SPECIMEN QUALITY: Adequate  Final   Source BLOOD 2  Final   STATUS: FINAL  Final   Result: No growth after 5 days  Final   COMMENT: Aerobic and anaerobic bottle received.  Final  Culture, blood (single) w Reflex to ID Panel     Status: None   Collection Time: 07/23/24  4:23 PM   Specimen: Vein; Blood  Result Value Ref Range Status   MICRO NUMBER: [id number]  Final   SPECIMEN QUALITY: Adequate  Final   Source BLOOD 1  Final   STATUS: FINAL  Final   Result: No growth after 5 days  Final   COMMENT: Aerobic and anaerobic bottle received.  Final  Urine Culture     Status: Abnormal   Collection Time: 07/24/24 12:18 AM   Specimen: Urine, Catheterized  Result Value Ref Range Status   Specimen Description URINE, CATHETERIZED  Final   Special Requests   Final    NONE Performed at [facility], [address], [place], [place] [address]    Culture 20,000 COLONIES/mL YEAST (A)  Final   Report Status 07/25/2024 FINAL  Final  Blood culture (routine x 2)     Status: None   Collection Time: 07/24/24  2:09 AM   Specimen: BLOOD LEFT ARM  Result Value Ref Range Status   Specimen Description BLOOD LEFT ARM  Final   Special Requests   Final    BOTTLES DRAWN AEROBIC AND ANAEROBIC Blood Culture adequate volume   Culture   Final    NO GROWTH 5 DAYS Performed at [facility], [address] [place], [place] [address]    Report Status 07/29/2024 FINAL  Final  Blood culture (routine x 2)     Status: None  Collection Time: 07/24/24  2:10 AM   Specimen: BLOOD LEFT ARM  Result Value Ref Range Status   Specimen Description BLOOD LEFT  ARM  Final   Special Requests   Final    BOTTLES DRAWN AEROBIC AND ANAEROBIC Blood Culture adequate volume   Culture   Final    NO GROWTH 5 DAYS Performed at [facility], [address], [place], [place] [address]    Report Status 07/29/2024 FINAL  Final  Culture, blood (Routine X 2) w Reflex to ID Panel     Status: None (Preliminary result)   Collection Time: 07/26/24  8:07 AM   Specimen: BLOOD LEFT ARM  Result Value Ref Range Status   Specimen Description BLOOD LEFT ARM  Final   Special Requests   Final    BOTTLES DRAWN AEROBIC AND ANAEROBIC Blood Culture results may not be optimal due to an inadequate volume of blood received in culture bottles   Culture   Final    NO GROWTH 3 DAYS Performed at [facility], [address], [place], [place] [address]    Report Status PENDING  Incomplete  Culture, blood (Routine X 2) w Reflex to ID Panel     Status: None (Preliminary result)   Collection Time: 07/26/24  8:08 AM   Specimen: BLOOD RIGHT HAND  Result Value Ref Range Status   Specimen Description BLOOD RIGHT HAND  Final   Special Requests   Final    BOTTLES DRAWN AEROBIC AND ANAEROBIC Blood Culture results may not be optimal due to an inadequate volume of blood received in culture bottles   Culture   Final    NO GROWTH 3 DAYS Performed at [facility], [address], [place], [place] [address]    Report Status PENDING  Incomplete     Labs: BNP (last 3 results) No results for input(s): BNP in the last 8760 hours. Basic Metabolic Panel: Recent Labs  Lab 07/25/24 0229 07/25/24 0900 07/26/24 0807 07/27/24 0253 07/28/24 0448 07/29/24 0246  NA 140  --  139 139 136 139  K 4.0  --  4.0 4.1 4.2 3.9  CL 102  --  102 102 100 99  CO2 29  --  26 27 25 27   GLUCOSE 89  --  92 96 100* 93  BUN 9  --  9 14 15 16   CREATININE 0.76  --  0.69 0.79 0.72 0.76  CALCIUM 8.2*  --  8.2* 8.2* 8.2* 8.5*  MG  --  1.7 1.7 1.8 1.9  --   PHOS  --  3.1 3.2 3.3 3.6  --     Liver Function Tests: Recent Labs  Lab 07/23/24 1623 07/23/24 2250 07/24/24 0702 07/25/24 0229  AST 12 15 14* 11*  ALT 16 22 22 8   ALKPHOS  --  71 69 67  BILITOT 0.3 0.4 0.5 0.5  PROT 5.8* 5.5* 5.3* 5.5*  ALBUMIN   --  2.3* 2.2* 2.2*   No results for input(s): LIPASE, AMYLASE in the last 168 hours. No results for input(s): AMMONIA in the last 168 hours. CBC: Recent Labs  Lab 07/23/24 1623 07/23/24 2250 07/24/24 0702 07/25/24 0229 07/26/24 0807 07/27/24 0253 07/28/24 0448 07/29/24 0246  WBC 16.9* 18.6* 15.7* 11.1* 13.9* 13.2* 13.6* 13.7*  NEUTROABS 12,996* 14.8* 11.6* 6.8  --   --   --   --   HGB 11.2* 10.2* 9.7* 10.2* 11.0* 10.7* 10.6* 10.6*  HCT 35.5* 33.0* 31.8* 32.7*  35.4* 34.0* 34.1* 34.8*  MCV 84.9 88.2 87.6 85.8 86.3 85.4 86.1 87.0  PLT 486* 440* 437* 407* 404* 408* 372 388   Cardiac Enzymes: Recent Labs  Lab 07/28/24 0448  CKTOTAL 70   BNP: Invalid input(s): POCBNP CBG: Recent Labs  Lab 07/28/24 0744 07/28/24 1158 07/28/24 1734 07/28/24 2219 07/29/24 0840  GLUCAP 122* 126* 156* 114* 88   D-Dimer No results for input(s): DDIMER in the last 72 hours. Hgb A1c No results for input(s): HGBA1C in the last 72 hours. Lipid Profile No results for input(s): CHOL, HDL, LDLCALC, TRIG, CHOLHDL, LDLDIRECT in the last 72 hours. Thyroid function studies No results for input(s): TSH, T4TOTAL, T3FREE, THYROIDAB in the last 72 hours.  Invalid input(s): FREET3 Anemia work up No results for input(s): VITAMINB12, FOLATE, FERRITIN, TIBC, IRON, RETICCTPCT in the last 72 hours. Urinalysis    Component Value Date/Time   COLORURINE AMBER (A) 07/24/2024 0018   APPEARANCEUR CLOUDY (A) 07/24/2024 0018   LABSPEC 1.025 07/24/2024 0018   PHURINE 6.0 07/24/2024 0018   GLUCOSEU NEGATIVE 07/24/2024 0018   HGBUR MODERATE (A) 07/24/2024 0018   BILIRUBINUR NEGATIVE 07/24/2024 0018   KETONESUR 5 (A) 07/24/2024 0018   PROTEINUR  100 (A) 07/24/2024 0018   UROBILINOGEN 0.2 06/03/2013 1559   NITRITE POSITIVE (A) 07/24/2024 0018   LEUKOCYTESUR LARGE (A) 07/24/2024 0018   Sepsis Labs Recent Labs  Lab 07/26/24 0807 07/27/24 0253 07/28/24 0448 07/29/24 0246  WBC 13.9* 13.2* 13.6* 13.7*   Microbiology Recent Results (from the past 240 hours)  Culture, blood (single) w Reflex to ID Panel     Status: None   Collection Time: 07/23/24  4:32 AM   Specimen: Vein; Blood  Result Value Ref Range Status   MICRO NUMBER: [id number]  Final   SPECIMEN QUALITY: Adequate  Final   Source BLOOD 2  Final   STATUS: FINAL  Final   Result: No growth after 5 days  Final   COMMENT: Aerobic and anaerobic bottle received.  Final  Culture, blood (single) w Reflex to ID Panel     Status: None   Collection Time: 07/23/24  4:23 PM   Specimen: Vein; Blood  Result Value Ref Range Status   MICRO NUMBER: [id number]  Final   SPECIMEN QUALITY: Adequate  Final   Source BLOOD 1  Final   STATUS: FINAL  Final   Result: No growth after 5 days  Final   COMMENT: Aerobic and anaerobic bottle received.  Final  Urine Culture     Status: Abnormal   Collection Time: 07/24/24 12:18 AM   Specimen: Urine, Catheterized  Result Value Ref Range Status   Specimen Description URINE, CATHETERIZED  Final   Special Requests   Final    NONE Performed at [facility], [address] [place], [place] [address]    Culture 20,000 COLONIES/mL YEAST (A)  Final   Report Status 07/25/2024 FINAL  Final  Blood culture (routine x 2)     Status: None   Collection Time: 07/24/24  2:09 AM   Specimen: BLOOD LEFT ARM  Result Value Ref Range Status   Specimen Description BLOOD LEFT ARM  Final   Special Requests   Final    BOTTLES DRAWN AEROBIC AND ANAEROBIC Blood Culture adequate volume   Culture   Final    NO GROWTH 5 DAYS Performed at [facility], [address] [place], [place] [address]    Report Status 07/29/2024 FINAL  Final  Blood  culture (routine  x 2)     Status: None   Collection Time: 07/24/24  2:10 AM   Specimen: BLOOD LEFT ARM  Result Value Ref Range Status   Specimen Description BLOOD LEFT ARM  Final   Special Requests   Final    BOTTLES DRAWN AEROBIC AND ANAEROBIC Blood Culture adequate volume   Culture   Final    NO GROWTH 5 DAYS Performed at [facility], [address], [place], [place] [address]    Report Status 07/29/2024 FINAL  Final  Culture, blood (Routine X 2) w Reflex to ID Panel     Status: None (Preliminary result)   Collection Time: 07/26/24  8:07 AM   Specimen: BLOOD LEFT ARM  Result Value Ref Range Status   Specimen Description BLOOD LEFT ARM  Final   Special Requests   Final    BOTTLES DRAWN AEROBIC AND ANAEROBIC Blood Culture results may not be optimal due to an inadequate volume of blood received in culture bottles   Culture   Final    NO GROWTH 3 DAYS Performed at [facility], [address], [place], [place] [address]    Report Status PENDING  Incomplete  Culture, blood (Routine X 2) w Reflex to ID Panel     Status: None (Preliminary result)   Collection Time: 07/26/24  8:08 AM   Specimen: BLOOD RIGHT HAND  Result Value Ref Range Status   Specimen Description BLOOD RIGHT HAND  Final   Special Requests   Final    BOTTLES DRAWN AEROBIC AND ANAEROBIC Blood Culture results may not be optimal due to an inadequate volume of blood received in culture bottles   Culture   Final    NO GROWTH 3 DAYS Performed at [facility], [address] [place], [place] [address]    Report Status PENDING  Incomplete     Time coordinating discharge:  I have spent 35 minutes face to face with the patient and on the ward discussing the patients care, assessment, plan and disposition with other care givers. >50% of the time was devoted counseling the patient about the risks and benefits of treatment/Discharge disposition and coordinating care.   SIGNED:   Burgess JAYSON Dare, MD  [facility] 07/29/2024, 9:18 AM   If 7PM-7AM, please contact night-coverage

## 2024-07-31 ENCOUNTER — Ambulatory Visit (INDEPENDENT_AMBULATORY_CARE_PROVIDER_SITE_OTHER): Admitting: Psychiatry

## 2024-07-31 DIAGNOSIS — F4010 Social phobia, unspecified: Secondary | ICD-10-CM

## 2024-07-31 DIAGNOSIS — F411 Generalized anxiety disorder: Secondary | ICD-10-CM

## 2024-07-31 DIAGNOSIS — Z789 Other specified health status: Secondary | ICD-10-CM

## 2024-07-31 DIAGNOSIS — F331 Major depressive disorder, recurrent, moderate: Secondary | ICD-10-CM

## 2024-07-31 LAB — CULTURE, BLOOD (ROUTINE X 2)
Culture: NO GROWTH
Culture: NO GROWTH

## 2024-07-31 NOTE — Progress Notes (Signed)
 Psychotherapy Progress Note [facility] [name], PhD LP  Patient ID: Douglas Price [facility])    MRN: [id number] Therapy format: Individual psychotherapy Date: 07/31/2024      Start: 9:10a     Stop: 9:55a     Time Spent: 45 min Location: Telehealth visit -- I connected with this patient by an approved telecommunication method (audio only), with his informed consent, and verifying identity and patient privacy.  I was located at my office and patient at his nursing home.  As needed, we discussed the limitations, risks, and security and privacy concerns associated with telehealth service, including the availability and conditions which currently govern in-person appointments and the possibility that 3rd-party payment may not be fully guaranteed and he may be responsible for charges.  After he indicated understanding, we proceeded with the session.  Also discussed treatment planning, as needed, including ongoing verbal agreement with the plan, the opportunity to ask and answer all questions, his demonstrated understanding of instructions, and his readiness to call the office should symptoms worsen or he feels he is in a crisis state and needs more immediate and tangible assistance.  RE Medicare in-person requirement -- In-person service is medically contraindicated for the foreseeable future, as patient is both confined to bed and wheelchair at his SNF, and at ongoing risk for falls and infection complications.  He is informed of the administrative requirement, and we agree that, as a matter of safety, service should still be conducted remotely.  He is further informed that coverage procedures have not been fully worked out and disseminated to providers, and his claim for this service may be denied, delayed, and appealed before it is settled.  Session narrative (presenting needs, interim history, self-report of stressors and symptoms, applications of prior therapy, status  changes, and interventions made in session) Call to given number reached [employer], either an designer, television/film set or nurses' station ([place]), required several attempts to transfer, eventually disconnected.  On callback, given direct room number -- [phone], which succeeded.  EHR shows 10/7 ED trip with dx 6th C vertebra fracture, then 10/30 ED trip for renewed sepsis.  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  On contact, he sounds energetic.  Remains [employer], rehab placement with expectation of remaining there.  Generally pleased with his accommodations, private room until he completes Medicaid spenddown.  Once Medicaid knows he will be required to semiprivate.  Wants to tell of last week's experience -- Thursday, in room, was trying to get help out of wheel chair (max 3 hrs, due to his ongoing wound care), couldn't get anyone's attention for 6 hours, so decided to call 911 on his own, which of course prompted plenty of action.  Had hypotension, discovered infected again, on antibiotics, was in hospital until mid-week this week.  Says same problem with understaffing now, after finding that there were reportedly no CNAs overnight the night he needed help.    House sold, car has sold, books in storage, and some selection now in his room.  Says he is feeling alienated, homeless, identity stripped away.  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  Is focused on wound healing.  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  Success with one student, RJ, who attained straight A work, but otherwise too stressful with the stumbling and falling he was doing, so sees his downfall as a form of rescue.  Affirmed that he has had to be open to the  net surprise after next surprise.  Recalls wondering if he would be able to make another year working before a voluntary retirement.  Validated spoiled plans for retirement living, as well as   Explored activities -- says nobody announces, just post games developer.  Alyse Hurl is his main outlet outside of daily calls with B Octaviano.  Feels Niels has coopted him somehow, now, says she's always been jealous of Zell, maybe he was mother's favorite, recalling a couple pained conversation a couple years ago.  Supportively confronted the assertion that she is still jealous, and angry, e.g., about planning to use [name]'s money unwisely, and he says he can accept that she has not been malevolent, just hasty in deciding to accept an offer on the house without him.  Offered that if he wants, he can broach the issue how he would have felt more dignity and peace about it had she informed him first and please be willing in the future.  Otherwise, hopefully forgiveness on grounds that none of them have a guidebook to go by for living into these new roles of SNF patient and patient's agent.    Therapeutic modalities: Cognitive Behavioral Therapy, Solution-Oriented/Positive Psychology, and Ego-Supportive  Mental Status/Observations:  Appearance:   Not assessed     Behavior:  Appropriate  Motor:  Not assessed  Speech/Language:   Clear and Coherent  Affect:  Not assessed  Mood:  dysthymic  Thought process:  normal  Thought content:    Some grudge-holding and projecting, largely normal  Sensory/Perceptual disturbances:    WNL  Orientation:  Fully oriented  Attention:  Good    Concentration:  Good  Memory:  WNL  Insight:    Fair  Judgment:   Fair  Impulse Control:  Good   Risk Assessment: Danger to Self: No Self-injurious Behavior: No Danger to Others: No Physical Aggression / Violence: No Duty to Warn: No Access to Firearms a concern: No  Assessment of progress:  progressing  Diagnosis:   ICD-10-CM   1.  Major depressive disorder, recurrent episode, moderate (HCC)  F33.1     2. Generalized anxiety disorder  F41.1     3. Social anxiety disorder  F40.10     4. Resides in skilled nursing facility  Z78.9      Plan:  Facility adjustment -- Will work through with family what possessions can be integrated into new living space, sale of his home, financial administration, and notification of issues in nursing care. Parkinson's and other health care -- Remains in facility care for the foreseeable future, at what sounds to be a skilled nursing or intensive assisted living level.  Defer to physicians of record, neurologist, and nursing service for treatment.  Endorse any justifiable rehab discipline, if not already engaged for auxiliary benefits to mood and activity.  Ongoing encouragement to check assumptions with all doctors, don't settle for cynicism or what down feelings may say is not worth the effort or expense, and follow through with other needed procedures as they arise.  Ensure good hydration.  If adding sedating medication of any kind, consider reducing Klonopin  or checking blood pressure regime to compensate for increased fall risk.  If questions arise about adequate aide or nursing service, Pt and siblings are  aware of their contacts with SNF. Financial concerns and family relations -- Continue to empower B Octaviano and GORMAN Ivanoff to act on his behalf with financial needs and responsibilities.  I would continue to endorse POA if needed, based on Bill's comprehension and stamina, but still worth all attempts to inform him about his own affairs before making final calls on any major decisions.  Continuing encouragement to family to uphold dignity by informing him, and for Pt to uphold rapport by not allowing himself to imagine too much about motives if surprised.  It remains clear that he needs this level of care, that it is only sustainable with Medicaid backing, and that in turn requires him to spend down and  relinquish possessions.  Practice trust in B and S to have his best interests in mind and to be willing within their means to augment his living and underwrite useful expenses not covered.  Notice and dispute any idealistic thinking that sibs should just see and figure things out and make offers without being asked.  Dispute any unnecessarily cynical thinking about them not caring.  For all needs, practice being clear and direct as possible about requests, including willing to ask how he's coming across, and appropriately grateful for any help provided. Social isolation -- Endorse participating in unit activities, befriending fellow residents, and receiving visitors wherever tolerable. Other recommendations/advice -- As may be noted above.  Continue to utilize previously learned skills ad lib. Medication compliance -- Maintain medication as prescribed and work faithfully with relevant prescriber(s) if any changes are desired or seem indicated. Crisis service -- Aware of call list and work-in appts.  Call the clinic on-call service, 988/hotline, 911, or present to [facility] or ER if any life-threatening psychiatric crisis. Followup -- Return for time as already scheduled.  Next scheduled visit with me 09/02/2024.  Next scheduled in this office 09/02/2024.  [name], PhD [name], PhD LP Clinical Psychologist, [facility] [address] [place], [place] [address] [phone]

## 2024-08-04 ENCOUNTER — Inpatient Hospital Stay (HOSPITAL_COMMUNITY)

## 2024-08-13 ENCOUNTER — Encounter: Payer: Self-pay | Admitting: Internal Medicine

## 2024-08-13 ENCOUNTER — Other Ambulatory Visit: Payer: Self-pay

## 2024-08-13 ENCOUNTER — Ambulatory Visit (INDEPENDENT_AMBULATORY_CARE_PROVIDER_SITE_OTHER): Admitting: Internal Medicine

## 2024-08-13 VITALS — BP 106/71 | HR 77 | Temp 97.8°F | Resp 16

## 2024-08-13 DIAGNOSIS — M869 Osteomyelitis, unspecified: Secondary | ICD-10-CM | POA: Diagnosis not present

## 2024-08-13 MED ORDER — DOXYCYCLINE HYCLATE 100 MG PO TABS: 100.0000 mg | ORAL_TABLET | Freq: Two times a day (BID) | ORAL | 0 refills | Status: AC

## 2024-08-13 MED ORDER — AMOXICILLIN-POT CLAVULANATE 875-125 MG PO TABS: 1.0000 | ORAL_TABLET | Freq: Two times a day (BID) | ORAL | 0 refills | Status: AC

## 2024-08-13 MED ORDER — AMOXICILLIN-POT CLAVULANATE 875-125 MG PO TABS: 1.0000 | ORAL_TABLET | Freq: Two times a day (BID) | ORAL | 0 refills | Status: DC

## 2024-08-13 MED ORDER — DOXYCYCLINE HYCLATE 100 MG PO TABS: 100.0000 mg | ORAL_TABLET | Freq: Two times a day (BID) | ORAL | 0 refills | Status: DC

## 2024-08-13 NOTE — Progress Notes (Signed)
 Patient: Douglas Price  DOB: May 25, 1951 MRN: [id number] PCP: Janey Santos, MD    Chief Complaint  Patient presents with   Follow-up     Patient Active Problem List   Diagnosis Date Noted   Hypotension 07/24/2024   Sacral wound 07/24/2024   Diabetes mellitus type 2 in nonobese (HCC) 07/24/2024   Anemia 07/24/2024   Essential hypertension 07/24/2024   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   Acute urinary retention 02/18/2024   Polypharmacy 02/15/2024   Hypokalemia 02/15/2024   Multiple wounds of skin 02/14/2024   Sepsis due to cellulitis (HCC) 02/14/2024   Transaminitis 02/14/2024   Fall at home, initial encounter 02/14/2024   Gait disturbance 02/14/2024   Depression 02/14/2024   Insomnia 02/14/2024   BPH (benign prostatic hyperplasia) 02/14/2024   GERD (gastroesophageal reflux disease) 02/14/2024   Pain due to onychomycosis of toenails of both feet 01/31/2023   Parkinson's disease (HCC) 11/25/2017   Diabetic polyneuropathy associated with type 2 diabetes mellitus (HCC) 11/25/2017   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   S/P lumbar spinal fusion 03/14/2016   S/P lumbar laminectomy 09/08/2015   Sleep disturbance 03/24/2014     Subjective:  Douglas Price is a 73 y.o. 73 y.o. -year-old male with past medical history of Parkinson's disease, chronic Foley, GERD, RLS, diabetes type 2 presents for hospital follow-up of Proteus Mirabella's bacteremia in the setting of stage IV decubitus ulcer.  Source of bacteremia was likely sacral ulcer.  Necrotic eschar on ulcer probably could benefit from wound care noted.  General surgery recommended local wound care and pressure displacement otherwise no surgical I&D recommended. - Patient was discharged on Doxycycline  and Augmentin  x 6 weeks EOT 9/11 06/25/24 : - No concern for surrounding infection/cellulitis  Wound was open.  Brother present during visit. 07/21/24: pt presents form SNF on  iv //merrem . He is not sure why he is here at appointment. Statese wound is about the same.  10/20 here for sacra wound. Today: dong well. He still feels unsteady standing up. Review of Systems  All other systems reviewed and are negative.   Past Medical History:  Diagnosis Date   Allergic rhinitis due to pollen    Benign neoplasm of colon    Blisters with epidermal loss due to burn (second degree) of foot    Complication of anesthesia    pt woke up during saphenous vein surgery-had epidural   Constipation due to pain medication    Degeneration of lumbar or lumbosacral intervertebral disc    Diverticulosis of colon (without mention of hemorrhage)    DM (diabetes mellitus) (HCC)    Esophageal reflux    Hypertrophy of prostate with urinary obstruction and other lower urinary tract symptoms (LUTS)    Loss of weight    Neuromuscular disorder (HCC)    peripheral neuropathy   Obesity, unspecified    Pain in limb    Restless legs syndrome (RLS)    Sleep disturbance 03/24/2014   Spinal stenosis, unspecified region other than cervical    Tobacco use disorder    Unspecified disease of pericardium    Unspecified essential hypertension    Unspecified local infection of skin and subcutaneous tissue    Varices of other sites     Outpatient Medications Prior to Visit  Medication Sig Dispense Refill   acetaminophen  (TYLENOL ) 325 MG tablet Take 650 mg by mouth every 6 (six) hours as needed.     ascorbic acid  (VITAMIN C ) 500 MG  tablet Take 1 tablet (500 mg total) by mouth daily.     bisacodyl  (DULCOLAX) 10 MG suppository Place 10 mg rectally as needed for moderate constipation. Insert one suppository rectally every 24 hours as needed for constipation.     bisacodyl  (DULCOLAX) 5 MG EC tablet Take 2 tablets (10 mg total) by mouth at bedtime.     buPROPion  (WELLBUTRIN  XL) 300 MG 24 hr tablet TAKE 1 TABLET(300 MG) BY MOUTH DAILY 30 tablet 0   carbidopa -levodopa  (SINEMET  IR) 25-100 MG tablet Take  1 tablet by mouth 3 (three) times daily. (Patient taking differently: Take 1 tablet by mouth 3 (three) times daily. Give one tablet by mouth three time a day at 1000, 1400, and 2200.)     clindamycin (CLEOCIN) 300 MG capsule Take 300 mg by mouth every 6 (six) hours.     clonazePAM  (KLONOPIN ) 0.5 MG tablet Take 1 tablet (0.5 mg total) by mouth 2 (two) times daily as needed for up to 7 days for anxiety. 14 tablet 0   clotrimazole  (LOTRIMIN ) 1 % cream Apply topically 2 (two) times daily for 15 days.     collagenase  (SANTYL ) 250 UNIT/GM ointment Apply topically daily.     cyanocobalamin  1000 MCG tablet Take 1 tablet (1,000 mcg total) by mouth daily.     diphenhydrAMINE -zinc  acetate (BENADRYL ) cream Apply topically 3 (three) times daily as needed for itching.     DULoxetine  (CYMBALTA ) 60 MG capsule Take 120 mg by mouth at bedtime.     dutasteride  (AVODART ) 0.5 MG capsule Take 0.5 mg by mouth daily.     heparin  lock flush 100 UNIT/ML SOLN injection Inject 300 Units into the vein every 8 (eight) hours. Use 3ml intravenously every 8 hours to maintain patency.     insulin  aspart (NOVOLOG ) 100 UNIT/ML injection Inject 0-9 Units into the skin 3 (three) times daily with meals.     iron  polysaccharides (NIFEREX) 150 MG capsule Take 1 capsule (150 mg total) by mouth daily.     lithium  carbonate 150 MG capsule TAKE 1 CAPSULE(150 MG) BY MOUTH EVERY EVENING (Patient taking differently: Take 150 mg by mouth in the morning.) 30 capsule 0   liver oil-zinc  oxide (DESITIN) 40 % ointment Apply topically 2 (two) times daily.     loratadine (CLARITIN) 10 MG tablet Take 10 mg by mouth daily as needed for allergies, rhinitis or itching.     magnesium  hydroxide (MILK OF MAGNESIA) 400 MG/5ML suspension Take 30 mLs by mouth daily as needed for mild constipation.     metFORMIN  (GLUCOPHAGE ) 500 MG tablet Take 1,000 mg by mouth 2 (two) times daily with a meal.     mirtazapine  (REMERON ) 15 MG tablet Take 15 mg by mouth at bedtime.      Multiple Vitamins-Minerals (DECUBI-VITE) CAPS Take 1 capsule by mouth in the morning.     nebivolol  (BYSTOLIC ) 2.5 MG tablet Take 2.5 mg by mouth daily.     nutrition supplement, JUVEN, (JUVEN) PACK Take 1 packet by mouth 2 (two) times daily between meals. (Patient taking differently: Take 1 packet by mouth 2 (two) times daily between meals. Give one packet by mouth two times a day between meals for protein supplement. Give at 1000 and 1800.)     nystatin  (MYCOSTATIN /NYSTOP ) powder Apply topically 2 (two) times daily.     omeprazole (PRILOSEC) 20 MG capsule Take 20 mg by mouth 2 (two) times daily before a meal.      oxyCODONE  (OXY IR/ROXICODONE ) 5 MG immediate release  tablet Take 1 tablet (5 mg total) by mouth every 4 (four) hours as needed for moderate pain (pain score 4-6) or severe pain (pain score 7-10). 30 tablet 0   Pollen Extracts (PROSTAT PO) Take 30 mLs by mouth 2 (two) times daily between meals. Give 30ml by mouth twice a day at 1000 and 1800 for nutrition support and wound healing.     polyethylene glycol (MIRALAX  / GLYCOLAX ) 17 g packet Take 17 g by mouth daily.     pregabalin  (LYRICA ) 75 MG capsule Take 1 capsule (75 mg total) by mouth 2 (two) times daily. 20 capsule 0   sodium chloride  flush (NS) 0.9 % SOLN Inject 10 mLs into the vein every 8 (eight) hours. Use 10ml intravenously every 8 hours to maintain line patency. Inject at 0000, 0800, and 1600.     SODIUM PHOSPHATES RE Place 1 Bottle rectally as needed (constipation). Insert one sodium phosphate  enema rectally every 24 hours as needed if no relief from suppository.     tamsulosin  (FLOMAX ) 0.4 MG CAPS capsule Take 0.4 mg by mouth at bedtime.     telmisartan  (MICARDIS ) 40 MG tablet Take 40 mg by mouth daily.     Vitamin D , Ergocalciferol , (DRISDOL ) 1.25 MG (50000 UNIT) CAPS capsule Take 1 capsule (50,000 Units total) by mouth every 7 (seven) days.     No facility-administered medications prior to visit.     Allergies  Allergen  Reactions   Avelox [Moxifloxacin] Other (See Comments)    Angioedema Fatigue   Quinolones Other (See Comments)    Angioedema   Lexapro [Escitalopram] Other (See Comments)    unknown    Social History   Tobacco Use   Smoking status: Some Days    Types: Pipe   Smokeless tobacco: Never   Tobacco comments:    moderate, 1-2bowls a day or more  Vaping Use   Vaping status: Never Used  Substance Use Topics   Alcohol use: No    Alcohol/week: 0.0 standard drinks of alcohol   Drug use: No    Family History  Problem Relation Age of Onset   Heart failure Mother    Lung cancer Father    Neuropathy Brother    Hypertension Paternal Grandfather    Tuberculosis Maternal Uncle    Cancer - Colon Maternal Uncle     Objective:  There were no vitals filed for this visit. There is no height or weight on file to calculate BMI.  Physical Exam Constitutional:      General: He is not in acute distress.    Appearance: He is normal weight. He is not toxic-appearing.  HENT:     Head: Normocephalic and atraumatic.     Right Ear: External ear normal.     Left Ear: External ear normal.     Nose: No congestion or rhinorrhea.     Mouth/Throat:     Mouth: Mucous membranes are moist.     Pharynx: Oropharynx is clear.  Eyes:     Extraocular Movements: Extraocular movements intact.     Conjunctiva/sclera: Conjunctivae normal.     Pupils: Pupils are equal, round, and reactive to light.  Cardiovascular:     Rate and Rhythm: Normal rate and regular rhythm.     Heart sounds: No murmur heard.    No friction rub. No gallop.  Pulmonary:     Effort: Pulmonary effort is normal.     Breath sounds: Normal breath sounds.  Abdominal:     General: Abdomen  is flat. Bowel sounds are normal.     Palpations: Abdomen is soft.  Musculoskeletal:     Cervical back: Normal range of motion and neck supple.  Skin:    General: Skin is warm and dry.  Neurological:     General: No focal deficit present.      Mental Status: He is oriented to person, place, and time.  Psychiatric:        Mood and Affect: Mood normal.     Lab Results: Lab Results  Component Value Date   WBC 13.7 (H) 07/29/2024   HGB 10.6 (L) 07/29/2024   HCT 34.8 (L) 07/29/2024   MCV 87.0 07/29/2024   PLT 388 07/29/2024    Lab Results  Component Value Date   CREATININE 0.76 07/29/2024   BUN 16 07/29/2024   NA 139 07/29/2024   K 3.9 07/29/2024   CL 99 07/29/2024   CO2 27 07/29/2024    Lab Results  Component Value Date   ALT 8 07/25/2024   AST 11 (L) 07/25/2024   ALKPHOS 67 07/25/2024   BILITOT 0.5 07/25/2024     Assessment & Plan:  #Concern for line infection -completed dpato + ctx x 2 weeks from line removal -labs today -line out  #Chronic sacral wound with osteo - Patient states she is on clindamycin for his sacral wound.  He is not comfortable getting up even with assistance today. - Would stop clindamycin 600 mg every 12 hours.  It was started on 11/14, plan for 21 days.  Okay with Doxy and Augmentin  for another week to complete 2 weeks of antibiotics for any superimposed infection. -If concern for wound infection please send notes and images.  We do not have Hoyer lift. - Follow-up with infectious disease as needed  Loney Stank, MD [facility]   08/13/24  10:09 AM I personally spent a total of 41 minutes in the care of the patient today including preparing to see the patient, getting/reviewing separately obtained history, performing a medically appropriate exam/evaluation, counseling and educating, placing orders, documenting clinical information in the EHR, independently interpreting results, and communicating results.

## 2024-08-13 NOTE — Patient Instructions (Addendum)
-   stop clindamycin 600 mg every 12 hours.  Start Doxy and Augmentin  for another week to complete 2 weeks of antibiotics for any superimposed infection. -If concern for wound infection please send notes and images.  We do not have Hoyer lift. - Follow-up with infectious disease as needed

## 2024-09-02 ENCOUNTER — Ambulatory Visit: Admitting: Psychiatry

## 2024-09-02 ENCOUNTER — Ambulatory Visit

## 2024-09-02 DIAGNOSIS — E440 Moderate protein-calorie malnutrition: Secondary | ICD-10-CM | POA: Diagnosis not present

## 2024-09-02 DIAGNOSIS — F331 Major depressive disorder, recurrent, moderate: Secondary | ICD-10-CM

## 2024-09-02 DIAGNOSIS — G20A1 Parkinson's disease without dyskinesia, without mention of fluctuations: Secondary | ICD-10-CM | POA: Diagnosis not present

## 2024-09-02 DIAGNOSIS — E119 Type 2 diabetes mellitus without complications: Secondary | ICD-10-CM | POA: Diagnosis not present

## 2024-09-02 DIAGNOSIS — S31000A Unspecified open wound of lower back and pelvis without penetration into retroperitoneum, initial encounter: Secondary | ICD-10-CM | POA: Diagnosis not present

## 2024-09-02 DIAGNOSIS — M48062 Spinal stenosis, lumbar region with neurogenic claudication: Secondary | ICD-10-CM

## 2024-09-02 DIAGNOSIS — F411 Generalized anxiety disorder: Secondary | ICD-10-CM | POA: Diagnosis not present

## 2024-09-02 DIAGNOSIS — Z8619 Personal history of other infectious and parasitic diseases: Secondary | ICD-10-CM

## 2024-09-02 DIAGNOSIS — Z789 Other specified health status: Secondary | ICD-10-CM | POA: Diagnosis not present

## 2024-09-02 NOTE — Progress Notes (Signed)
 Psychotherapy Progress Note [facility] [name], PhD LP  Patient ID: Douglas Price)    MRN: [id number] Therapy format: Individual psychotherapy Date: 09/02/2024      Start: 10:12a     Stop: 10:53a     Time Spent: 41 min Location: Telehealth visit -- I connected with this patient by an approved telecommunication method (audio only), with his informed consent, and verifying identity and patient privacy.  I was located at my office and patient at his home.  As needed, we discussed the limitations, risks, and security and privacy concerns associated with telehealth service, including the availability and conditions which currently govern in-person appointments and the possibility that 3rd-party payment may not be fully guaranteed and he may be responsible for charges.  After he indicated understanding, we proceeded with the session.  Also discussed treatment planning, as needed, including ongoing verbal agreement with the plan, the opportunity to ask and answer all questions, his demonstrated understanding of instructions, and his readiness to call the office should symptoms worsen or he feels he is in a crisis state and needs more immediate and tangible assistance.   Session narrative (presenting needs, interim history, self-report of stressors and symptoms, applications of prior therapy, status changes, and interventions made in session) Voice husky and a bit weak.  Has appt this afternoon with plastic surgeon about closing his bedsore.  Soon to see urology also about whether he can have catheter removed.  From the sound of it, will do urodynamic study to test for bladder control.  Thankfully, infection-free since last sepsis episode end of October, and injury-free as well.  Formally retired last month.  Principal offered for him to come over to school for a retirement fete, and offered to send a ride for him, not understanding how involved that would be.   Politely declined, informing him he can't walk, so pretty sure a delegation will come to him once break begins.  Feels funny not to get up and go to work every day, abruptly out of commission.  Support/validation provided.  Says he'Douglas Price not clear why all this happened, starting in May.  Does not seem to be cognitive decline but more repression of a painful issue, perhaps.  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  The physical baseline observed was inherently problematic enough to warrant facility care for safety.  Accepted.  At this point, still spends long period in w/c or bed.  Gets PT on some schedule, supposedly restorative walking but says [facility] is not sufficiently staffed to spare an adie for that.  C/o back itching, fairly severely, for a few hours midday most days.  Could be his wound healing, but thinking it'Douglas Price moisture and heat trapped, something like prickly heat, with rash evident.  Wonders what he can do for it, referred back to physician or nurse, and promoted any stretching and airing out the area he can do on his own.  Remembers it was sitting 6 hours  in w/c unattended that led him to call 911 on 10/30.    Notes problem with his height fitting the bed, feet extend on top of the foot board, requires padding.  Hooked up to catheter also tends to ground him to the bed.  An air mattress has been brought in that runs a wave under his skin to help prevent further bedsores.  Douglas Price is coming  in after this, will escort him to engineer, petroleum.  Douglas Price can't visit as much as hoped, though he still manages finances and has apparently found savings.  Acknowledged that his family prevents him from coming down from [place]  very often.  His W'Douglas Price health has been a chronic need, and his D Jon is autistic and insecure about being home alone with her mother.  Glad to have him managing his financial affairs and succeeded in reworking assets.    Emphasized having conversation with PT and Douglas Douglas Price about how he could get off his back more and get more opportunities to stretch and strengthen his legs.    Report from staff, attempting routine followup to schedule, that Pt not making any decisions just yet.  Likely preoccupied with this afternoon'Douglas Price specialist appt, 2nd call placed to sister Douglas Price.    Therapeutic modalities: Solution-Oriented/Positive Psychology, Ego-Supportive, and Psycho-education/Bibliotherapy  Mental Status/Observations:  Appearance:   Not assessed     Behavior:  Appropriate  Motor:  Not assessed  Speech/Language:   Clear and Coherent and husky  Affect:  Not assessed  Mood:  dysthymic  Thought process:  normal  Thought content:    WNL  Sensory/Perceptual disturbances:    WNL  Orientation:  Fully oriented  Attention:  Good    Concentration:  Fair  Memory:  WNL  Insight:    Fair  Judgment:   Variable  Impulse Control:  Good   Risk Assessment: Danger to Self: No Self-injurious Behavior: No Danger to Others: No Physical Aggression / Violence: No Duty to Warn: No Access to Firearms a concern: No  Assessment of progress:  stabilized  Diagnosis:   ICD-10-CM   1. Major depressive disorder, recurrent episode, moderate (HCC)  F33.1     2. Generalized anxiety disorder  F41.1     3. Parkinson'Douglas Price disease without dyskinesia or fluctuating manifestations (HCC)  G20.A1     4. 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 Resides in skilled  nursing facility  Z78.9      Plan:  Facility adjustment -- Will work through with family what possessions can be integrated into new living space, sale of his home, financial administration, and notification of issues in nursing care. Parkinson'Douglas Price and other health care -- Remains in facility care for the foreseeable future, at what sounds to be a skilled nursing or intensive assisted living level.  Defer to physicians of record, neurologist, and nursing service for treatment.  Endorse any justifiable rehab discipline, if not already engaged for auxiliary benefits to mood and activity.  Ongoing encouragement to check assumptions with all doctors, don't settle for cynicism or what down feelings may say is not worth the effort or expense, and follow through with other needed procedures as they arise.  Ensure good hydration.  If adding sedating medication of any kind, consider reducing Klonopin  or checking blood pressure regime to compensate for increased fall risk.  If questions arise about adequate aide or nursing service, Pt and siblings are aware of their contacts with SNF. Financial concerns and family  relations -- Continue to empower Douglas Price Douglas Price and Douglas Price to act on his behalf with financial needs and responsibilities.  I would continue to endorse POA if needed, based on Douglas Price'Douglas Price comprehension and stamina, but still worth all attempts to inform him about his own affairs before making final calls on any major decisions.  Continuing encouragement to family to uphold dignity by informing him, and for Pt to uphold rapport by not allowing himself to imagine too much about motives if surprised.  It remains clear that he needs this level of care, that it is only sustainable with Medicaid backing, and that in turn requires him to spend down and relinquish possessions.  Practice trust in Douglas Price and Douglas Price to have his best interests in mind and to be willing within their means to augment his living and underwrite useful expenses not  covered.  Notice and dispute any idealistic thinking that sibs should just see and figure things out and make offers without being asked.  Dispute any unnecessarily cynical thinking about them not caring.  For all needs, practice being clear and direct as possible about requests, including willing to ask how he'Douglas Price coming across, and appropriately grateful for any help provided. Social isolation -- Endorse participating in unit activities, befriending fellow residents, and receiving visitors wherever tolerable. Other recommendations/advice -- As may be noted above.  Continue to utilize previously learned skills ad lib. Medication compliance -- Maintain medication as prescribed and work faithfully with relevant prescriber(Douglas Price) if any changes are desired or seem indicated. Crisis service -- Aware of call list and work-in appts.  Call the clinic on-call service, 988/hotline, 911, or present to [facility] or ER if any life-threatening psychiatric crisis. Followup -- Return for time as already scheduled.  Next scheduled visit with me 10/02/2024.  Next scheduled in this office 10/02/2024.  [name], PhD [name], PhD LP Clinical Psychologist, [facility] [address], [address] [place], [place] [address] [phone]

## 2024-09-02 NOTE — Progress Notes (Signed)
 New Patient Office Visit  Subjective    Patient ID: Douglas Price, male    DOB: 1951/01/01  Age: 73 y.o. MRN: [id number]  CC:  Chief Complaint  Patient presents with   Consult    HPI Douglas Price presents to establish care  73 year old male with a significant medical history including Parkinsons disease, chronic indwelling Foley catheter, GERD, restless legs syndrome, type 2 diabetes mellitus, and a chronic stage IV decubitus ulcer. He has had multiple prior admissions for bacteremia and sepsis and was followed by the hospitalist service and general surgery during his most recent hospitalization.  The patient presents today to discuss potential flap coverage of his chronic pressure ulcer. He arrives accompanied by his sister. He reports difficulty with mobility and is unable to get up even with assistance today. A Hoyer lift is not available in clinic.  MA was present as chaperone. I introduced myself as Dr. Zenobia Kuennen.  Outpatient Encounter Medications as of 09/02/2024  Medication Sig   acetaminophen  (TYLENOL ) 325 MG tablet Take 650 mg by mouth every 6 (six) hours as needed.   ascorbic acid  (VITAMIN C ) 500 MG tablet Take 1 tablet (500 mg total) by mouth daily.   bisacodyl  (DULCOLAX) 10 MG suppository Place 10 mg rectally as needed for moderate constipation. Insert one suppository rectally every 24 hours as needed for constipation.   bisacodyl  (DULCOLAX) 5 MG EC tablet Take 2 tablets (10 mg total) by mouth at bedtime.   buPROPion  (WELLBUTRIN  XL) 300 MG 24 hr tablet TAKE 1 TABLET(300 MG) BY MOUTH DAILY   carbidopa -levodopa  (SINEMET  IR) 25-100 MG tablet Take 1 tablet by mouth 3 (three) times daily. (Patient taking differently: Take 1 tablet by mouth 3 (three) times daily. Give one tablet by mouth three time a day at 1000, 1400, and 2200.)   clindamycin (CLEOCIN) 300 MG capsule Take 300 mg by mouth every 6 (six) hours.   clonazePAM  (KLONOPIN ) 0.5 MG tablet Take 1 tablet  (0.5 mg total) by mouth 2 (two) times daily as needed for up to 7 days for anxiety.   collagenase  (SANTYL ) 250 UNIT/GM ointment Apply topically daily.   cyanocobalamin  1000 MCG tablet Take 1 tablet (1,000 mcg total) by mouth daily.   diphenhydrAMINE -zinc  acetate (BENADRYL ) cream Apply topically 3 (three) times daily as needed for itching.   DULoxetine  (CYMBALTA ) 60 MG capsule Take 120 mg by mouth at bedtime.   dutasteride  (AVODART ) 0.5 MG capsule Take 0.5 mg by mouth daily.   heparin  lock flush 100 UNIT/ML SOLN injection Inject 300 Units into the vein every 8 (eight) hours. Use 3ml intravenously every 8 hours to maintain patency.   insulin  aspart (NOVOLOG ) 100 UNIT/ML injection Inject 0-9 Units into the skin 3 (three) times daily with meals.   iron  polysaccharides (NIFEREX) 150 MG capsule Take 1 capsule (150 mg total) by mouth daily.   lithium  carbonate 150 MG capsule TAKE 1 CAPSULE(150 MG) BY MOUTH EVERY EVENING (Patient taking differently: Take 150 mg by mouth in the morning.)   loratadine (CLARITIN) 10 MG tablet Take 10 mg by mouth daily as needed for allergies, rhinitis or itching.   magnesium  hydroxide (MILK OF MAGNESIA) 400 MG/5ML suspension Take 30 mLs by mouth daily as needed for mild constipation.   metFORMIN  (GLUCOPHAGE ) 500 MG tablet Take 1,000 mg by mouth 2 (two) times daily with a meal.   mirtazapine  (REMERON ) 15 MG tablet Take 15 mg by mouth at bedtime.   Multiple Vitamins-Minerals (DECUBI-VITE) CAPS Take 1 capsule  by mouth in the morning.   nebivolol  (BYSTOLIC ) 2.5 MG tablet Take 2.5 mg by mouth daily.   nutrition supplement, JUVEN, (JUVEN) PACK Take 1 packet by mouth 2 (two) times daily between meals. (Patient taking differently: Take 1 packet by mouth 2 (two) times daily between meals. Give one packet by mouth two times a day between meals for protein supplement. Give at 1000 and 1800.)   nystatin  (MYCOSTATIN /NYSTOP ) powder Apply topically 2 (two) times daily.   omeprazole  (PRILOSEC) 20 MG capsule Take 20 mg by mouth 2 (two) times daily before a meal.    oxyCODONE  (OXY IR/ROXICODONE ) 5 MG immediate release tablet Take 1 tablet (5 mg total) by mouth every 4 (four) hours as needed for moderate pain (pain score 4-6) or severe pain (pain score 7-10).   Pollen Extracts (PROSTAT PO) Take 30 mLs by mouth 2 (two) times daily between meals. Give 30ml by mouth twice a day at 1000 and 1800 for nutrition support and wound healing.   polyethylene glycol (MIRALAX  / GLYCOLAX ) 17 g packet Take 17 g by mouth daily.   pregabalin  (LYRICA ) 75 MG capsule Take 1 capsule (75 mg total) by mouth 2 (two) times daily.   sodium chloride  flush (NS) 0.9 % SOLN Inject 10 mLs into the vein every 8 (eight) hours. Use 10ml intravenously every 8 hours to maintain line patency. Inject at 0000, 0800, and 1600.   SODIUM PHOSPHATES RE Place 1 Bottle rectally as needed (constipation). Insert one sodium phosphate  enema rectally every 24 hours as needed if no relief from suppository.   tamsulosin  (FLOMAX ) 0.4 MG CAPS capsule Take 0.4 mg by mouth at bedtime.   telmisartan  (MICARDIS ) 40 MG tablet Take 40 mg by mouth daily.   Vitamin D , Ergocalciferol , (DRISDOL ) 1.25 MG (50000 UNIT) CAPS capsule Take 1 capsule (50,000 Units total) by mouth every 7 (seven) days.   No facility-administered encounter medications on file as of 09/02/2024.    Past Medical History:  Diagnosis Date   Allergic rhinitis due to pollen    Benign neoplasm of colon    Blisters with epidermal loss due to burn (second degree) of foot    Complication of anesthesia    pt woke up during saphenous vein surgery-had epidural   Constipation due to pain medication    Degeneration of lumbar or lumbosacral intervertebral disc    Diverticulosis of colon (without mention of hemorrhage)    DM (diabetes mellitus) (HCC)    Esophageal reflux    Hypertrophy of prostate with urinary obstruction and other lower urinary tract symptoms (LUTS)    Loss of  weight    Neuromuscular disorder (HCC)    peripheral neuropathy   Obesity, unspecified    Pain in limb    Restless legs syndrome (RLS)    Sleep disturbance 03/24/2014   Spinal stenosis, unspecified region other than cervical    Tobacco use disorder    Unspecified disease of pericardium    Unspecified essential hypertension    Unspecified local infection of skin and subcutaneous tissue    Varices of other sites     Past Surgical History:  Procedure Laterality Date   BACK SURGERY     CATARACT EXTRACTION Bilateral    one in july and other in Aug.2023   COLONOSCOPY W/ POLYPECTOMY     CYST EXCISION Left 04/09/2022   Procedure: LEFT INDEX FINGER MUCOID CYST EXCISION;  Surgeon: Murrell Drivers, MD;  Location: [facility];  Service: Orthopedics;  Laterality: Left;  Bier block   CYST EXCISION Left 05/02/2023   Procedure: LEFT INDEX FINGER MUCOID CYST EXCISION AND DISTAL INTERPHALANGEAL JOINT DEBRIDEMENT;  Surgeon: Murrell Drivers, MD;  Location: [facility];  Service: Orthopedics;  Laterality: Left;   LUMBAR LAMINECTOMY/DECOMPRESSION MICRODISCECTOMY Bilateral 09/08/2015   Procedure: Laminectomy and Foraminotomy - Lumbar two-lumbar three bilateral;  Surgeon: Alm GORMAN Molt, MD;  Location: [facility];  Service: Neurosurgery;  Laterality: Bilateral;   SAPHENOUS VEIN GRAFT RESECTION Right 09/24/1989   TENDON EXPLORATION Left 04/09/2022   Procedure: DEBRIDEMENT DISTAL INTERPHALANGEAL JOINT LEFT INDEX FINGER;  Surgeon: Murrell Drivers, MD;  Location: [facility];  Service: Orthopedics;  Laterality: Left;  Bier block   TONSILLECTOMY     TRIGGER FINGER RELEASE Left 05/02/2023   Procedure: LEFT RING FINGER TRIGGER RELEASE;  Surgeon: Murrell Drivers, MD;  Location: [facility];  Service: Orthopedics;  Laterality: Left;    Family History  Problem Relation Age of Onset   Heart failure Mother    Lung cancer Father    Neuropathy Brother    Hypertension  Paternal Grandfather    Tuberculosis Maternal Uncle    Cancer - Colon Maternal Uncle     Social History   Socioeconomic History   Marital status: Single    Spouse name: Not on file   Number of children: 0   Years of education: BA   Highest education level: Not on file  Occupational History    Employer: [employer]    Comment: Schools   Occupation: TEACHER    Employer: [employer]   Occupation: geologist, engineering    Comment: [employer]  Tobacco Use   Smoking status: Some Days    Types: Pipe   Smokeless tobacco: Never   Tobacco comments:    moderate, 1-2bowls a day or more  Vaping Use   Vaping status: Never Used  Substance and Sexual Activity   Alcohol use: No    Alcohol/week: 0.0 standard drinks of alcohol   Drug use: No   Sexual activity: Not on file  Other Topics Concern   Not on file  Social History Narrative   Grew up in [place], graduated from [employer] in History. Worked for [employer]. He had a homeschool, or substituted.    Likes to read.    Patient lives at home alone. Sister and brother help with household things as needed.       Caffeine Use: 1/2-1 of coffee/tea daily   Right handed   Religion-anglican, Sherlean, grew up [employer]: Low Risk  (02/07/2023)   Overall Financial Resource Strain (CARDIA)    Difficulty of Paying Living Expenses: Not hard at all  Food Insecurity: No Food Insecurity (07/25/2024)   Hunger Vital Sign    Worried About Running Out of Food in the Last Year: Never true    Ran Out of Food in the Last Year: Never true  Transportation Needs: No Transportation Needs (07/25/2024)   PRAPARE - Administrator, Civil Service (Medical): No    Lack of Transportation (Non-Medical): No  Physical Activity: Not on file  Stress: Not on file  Social Connections: Moderately Isolated (07/25/2024)   Social Connection and Isolation Panel     Frequency of Communication with Friends and Family: Three times a week    Frequency of Social Gatherings with Friends and Family: Once a week    Attends  Religious Services: More than 4 times per year    Active Member of Clubs or Organizations: No    Attends Banker Meetings: Never    Marital Status: Never married  Intimate Partner Violence: Unknown (07/28/2024)   Humiliation, Afraid, Rape, and Kick questionnaire    Fear of Current or Ex-Partner: No    Emotionally Abused: No    Physically Abused: Patient declined    Sexually Abused: No    ROS ROS negative except as noted in HPI  Objective    There were no vitals taken for this visit.  Physical Exam Constitutional:      General: He is not in acute distress.    Appearance: He looks malnourish. He is not toxic-appearing.  HENT:     Head: Normocephalic and atraumatic.     Mouth/Throat:     Mouth: Mucous membranes are moist.  Cardiovascular: Hemodynamically normal  Pulmonary:     Effort: Pulmonary effort is normal.   Abdominal:     General: Abdomen is flat. Bowel sounds are normal.   Musculoskeletal:     No leg strength. Parkinson involuntary movements present. Skin:    General: Skin is warm.  Neurological:     Mental Status: He is oriented to person, place, and time.  Psychiatric:        Mood and Affect: Mood normal.   Sacral wound examination limited due to lack of difficulty with mobility and patient being unable to get up even with assistance today. A Hoyer lift is not available in clinic. Yet, Stage IV ulcer noticed, covered in soil and bad odor. No cellulitis noticed around the wound,. Per patient last time the dressings were change was yesterday.    Last CBC Lab Results  Component Value Date   WBC 13.7 (H) 07/29/2024   HGB 10.6 (L) 07/29/2024   HCT 34.8 (L) 07/29/2024   MCV 87.0 07/29/2024   MCH 26.5 07/29/2024   RDW 15.4 07/29/2024   PLT 388 07/29/2024   Last metabolic panel Lab Results   Component Value Date   GLUCOSE 93 07/29/2024   NA 139 07/29/2024   K 3.9 07/29/2024   CL 99 07/29/2024   CO2 27 07/29/2024   BUN 16 07/29/2024   CREATININE 0.76 07/29/2024   GFRNONAA >60 07/29/2024   CALCIUM 8.5 (L) 07/29/2024   PHOS 3.6 07/28/2024   PROT 5.5 (L) 07/25/2024   ALBUMIN  2.2 (L) 07/25/2024   BILITOT 0.5 07/25/2024   ALKPHOS 67 07/25/2024   AST 11 (L) 07/25/2024   ALT 8 07/25/2024   ANIONGAP 13 07/29/2024   Last hemoglobin A1c Lab Results  Component Value Date   HGBA1C 5.6 07/24/2024        Assessment & Plan:   Problem List Items Addressed This Visit       Endocrine   Diabetes mellitus type 2 in nonobese [facility])     Other   Sacral wound - Primary   Other Visit Diagnoses       Moderate protein-calorie malnutrition         History of sepsis          Flap Candidate Criteria:   A. No new wounds for 3 months - New wounds present and per sister sacral wound getting larger.  B. No nicotine  use including cigarettes, e-cigs, tobacco containing products for 3 months - Does not smoke C. Family support in the home for up to 6 weeks after flap surgery can be confirmed to assist patient with ADLs  and meal preparation or confirmed placement in a skilled nursing facility for bedrest during the 6 week recovery period - As per sister not being turned every 2 hours and applying pressure to wound for long periods of time. D. Adequate nutrition i.e. No weight loss for 3 months - The patient demonstrates clinical evidence of malnutrition, which is significantly impairing wound healing, including the chronic stage IV sacral ulcer. Adequate nutritional status is essential for tissue repair, immune function, and overall recovery. He requires a comprehensive nutritional assessment and initiation of an appropriate treatment plan, including optimization of caloric intake, protein supplementation, and correction of micronutrient deficiencies as indicated. E. Functioning colostomy  for 3 months for patient whose wounds are soiled by incontinence or whose wounds are high risk for soiling after surgery due to location of incisions for flap - Has not been evaluated by colorectal surgery. His wound is covered in soil/stool. A diverting stoma (e.g., diverting colostomy) may be beneficial to improve sacral ulcer hygiene and reduce fecal contamination of the wound. Improved cleanliness would support wound healing, decrease risk of recurrent infection, and potentially enhance the patient's candidacy for future flap reconstruction. F. Low air loss bed or equivalent in the home - Patient at nursing facility G. Recent wheelchair evaluation and custom cushion performed in the last year - Not done H. Wounds are smaller or show improvement i.e. Granulation tissue to demonstrate ability to heal a flap as well and new new necrotic tissues if forming - Wound is not improving, getting worse. I. No illicit drug use for 3 months - Denies illicit drugs.   Patient is not a flap candidate at this time.   At high risk for other pressure injuries of skin. Recommend to:  - turn q2h - avoid back lying on sacrum unless medically contraindicated; i.e. turn 30 degrees from supine using wedges or pillows or positioning system devices - head of bed 30 degrees or less due to increased pressure on sacrum unless medically contraindicated - when in a chair or chair position in bed, may sit up at 90 degrees on ischium; avoid reclining 30-90 degrees when in chair or chair position in bed due to pressure on sacrum - if patient can't sit at 90 degrees, don't recline chair for comfort but rather return to bed - heel lift boots or elevate heels off bed - when in chair, use air cushion sitting device - when in chair, start with one hour sessions and then advance as tolerated - when in chair, provide pressure relief education; educate patient and reinforce repositioning in chair every 15 minutes by leaning side to  side and then forward for 15 seconds each or may push up in chair to relieve all buttocks pressure for 15 seconds; if pt. can't safely perform or isn't able to understand directions to perform independently, consider returning pt. to bed until pt. compliance with these instructions can be demonstrated - do not use donuts as a means of pressure relief  Order Dolphin Bed if not already in place: HOB 30 degrees or less except 30 minutes TID for meals or unless medically contraindicated. Do NOT turn Dolphin pump off or unplug from the bed outlet under foot of bed. When you look at the dolphin bed blue grey pump device hanging on edge of bed, the pump should be on and orange/yellow indicator lights showing air is being pumped into bed. Placement of the Dolphin bed does NOT negate the need to continue turning the patient every 2 hours.  Wound care: Wet to dry Kerlix with Vashe BID and PRN with every bowel movement, cover with ABD pads and paper tape. Referral to wound care center for closer follow up.   Other consults:  Nutrition and Colorectal Surgery.   Skilled nursing facility (SNF) placement is medically indicated and essential for this patients ongoing care. The patient requires a higher level of support due to multiple complex medical conditions, limited mobility, and the need for consistent wound care management, including treatment of a chronic stage IV sacral ulcer. He would benefit from intensified physical therapy and occupational therapy to improve mobility, strength, and functional status.  Given his history of recurrent infections and multiple episodes of bacteremia and sepsis, placement in a private room should be strongly considered to reduce infection risk and ensure optimal infection-control measures. If any concerns for infection instructed to go to the nearest emergency department. Patient and sister expressed understanding and agree with the plan.   Charnele Semple M. Ashleymarie Granderson, MD [facility]

## 2024-09-03 ENCOUNTER — Telehealth: Payer: Self-pay

## 2024-09-03 NOTE — Telephone Encounter (Signed)
 [facility] [address] [place] Room [phone] [place]  [address] [phone] Fax [phone]

## 2024-09-28 ENCOUNTER — Encounter: Payer: Self-pay | Admitting: Internal Medicine

## 2024-09-28 ENCOUNTER — Other Ambulatory Visit: Payer: Self-pay

## 2024-09-28 ENCOUNTER — Ambulatory Visit: Admitting: Internal Medicine

## 2024-09-28 DIAGNOSIS — L89154 Pressure ulcer of sacral region, stage 4: Secondary | ICD-10-CM

## 2024-09-28 DIAGNOSIS — M869 Osteomyelitis, unspecified: Secondary | ICD-10-CM

## 2024-09-28 DIAGNOSIS — M4628 Osteomyelitis of vertebra, sacral and sacrococcygeal region: Secondary | ICD-10-CM | POA: Diagnosis not present

## 2024-09-28 NOTE — Patient Instructions (Addendum)
-  please send wound care notes and reason for visit. If concern for wound infection please send notes and images.  We do not have Hoyer lift. -Pt noted no concerns today, please send reason for visit - F/U with ID prn
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
## 2024-10-02 ENCOUNTER — Ambulatory Visit: Admitting: Psychiatry

## 2024-10-02 DIAGNOSIS — M48062 Spinal stenosis, lumbar region with neurogenic claudication: Secondary | ICD-10-CM

## 2024-10-02 DIAGNOSIS — Z789 Other specified health status: Secondary | ICD-10-CM

## 2024-10-02 DIAGNOSIS — F331 Major depressive disorder, recurrent, moderate: Secondary | ICD-10-CM | POA: Diagnosis not present

## 2024-10-02 DIAGNOSIS — G20A1 Parkinson's disease without dyskinesia, without mention of fluctuations: Secondary | ICD-10-CM
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
## 2024-11-20 ENCOUNTER — Ambulatory Visit: Admitting: Psychiatry

## 2024-12-02 ENCOUNTER — Ambulatory Visit

## 2024-12-03 ENCOUNTER — Ambulatory Visit: Admitting: Psychiatry
